# Patient Record
Sex: Female | Born: 1935
Health system: Southern US, Community
[De-identification: ages and names within clinical notes are randomized; demographics above are authoritative.]

## PROBLEM LIST (undated history)

## (undated) DIAGNOSIS — E669 Obesity, unspecified: Principal | ICD-10-CM

## (undated) DIAGNOSIS — R609 Edema, unspecified: Secondary | ICD-10-CM

## (undated) DIAGNOSIS — Z8719 Personal history of other diseases of the digestive system: Secondary | ICD-10-CM

## (undated) DIAGNOSIS — Z5189 Encounter for other specified aftercare: Secondary | ICD-10-CM

## (undated) DIAGNOSIS — M25551 Pain in right hip: Secondary | ICD-10-CM

## (undated) DIAGNOSIS — H269 Unspecified cataract: Secondary | ICD-10-CM

## (undated) DIAGNOSIS — M549 Dorsalgia, unspecified: Secondary | ICD-10-CM

## (undated) DIAGNOSIS — K219 Gastro-esophageal reflux disease without esophagitis: Secondary | ICD-10-CM

## (undated) DIAGNOSIS — N905 Atrophy of vulva: Secondary | ICD-10-CM

## (undated) DIAGNOSIS — M2550 Pain in unspecified joint: Secondary | ICD-10-CM

## (undated) DIAGNOSIS — S43014A Anterior dislocation of right humerus, initial encounter: Secondary | ICD-10-CM

## (undated) DIAGNOSIS — K7460 Unspecified cirrhosis of liver: Secondary | ICD-10-CM

## (undated) DIAGNOSIS — C4492 Squamous cell carcinoma of skin, unspecified: Secondary | ICD-10-CM

## (undated) DIAGNOSIS — R740 Nonspecific elevation of levels of transaminase and lactic acid dehydrogenase [LDH]: Secondary | ICD-10-CM

## (undated) DIAGNOSIS — K429 Umbilical hernia without obstruction or gangrene: Secondary | ICD-10-CM

## (undated) DIAGNOSIS — L659 Nonscarring hair loss, unspecified: Secondary | ICD-10-CM

## (undated) DIAGNOSIS — E1169 Type 2 diabetes mellitus with other specified complication: Secondary | ICD-10-CM

## (undated) DIAGNOSIS — Z8619 Personal history of other infectious and parasitic diseases: Secondary | ICD-10-CM

## (undated) DIAGNOSIS — N189 Chronic kidney disease, unspecified: Secondary | ICD-10-CM

## (undated) DIAGNOSIS — Z87448 Personal history of other diseases of urinary system: Secondary | ICD-10-CM

## (undated) DIAGNOSIS — L570 Actinic keratosis: Secondary | ICD-10-CM

## (undated) DIAGNOSIS — R32 Unspecified urinary incontinence: Secondary | ICD-10-CM

## (undated) DIAGNOSIS — C4401 Basal cell carcinoma of skin of lip: Secondary | ICD-10-CM

## (undated) DIAGNOSIS — K760 Fatty (change of) liver, not elsewhere classified: Secondary | ICD-10-CM

## (undated) DIAGNOSIS — I10 Essential (primary) hypertension: Secondary | ICD-10-CM

## (undated) DIAGNOSIS — G4700 Insomnia, unspecified: Secondary | ICD-10-CM

## (undated) DIAGNOSIS — H9193 Unspecified hearing loss, bilateral: Secondary | ICD-10-CM

## (undated) DIAGNOSIS — D126 Benign neoplasm of colon, unspecified: Secondary | ICD-10-CM

## (undated) DIAGNOSIS — G629 Polyneuropathy, unspecified: Secondary | ICD-10-CM

## (undated) DIAGNOSIS — E782 Mixed hyperlipidemia: Secondary | ICD-10-CM

## (undated) DIAGNOSIS — F4321 Adjustment disorder with depressed mood: Secondary | ICD-10-CM

## (undated) HISTORY — DX: Encounter for other specified aftercare: Z51.89

## (undated) HISTORY — DX: Nonspecific elevation of levels of transaminase and lactic acid dehydrogenase (ldh): R74.0

## (undated) HISTORY — DX: Insomnia, unspecified: G47.00

## (undated) HISTORY — DX: Mixed hyperlipidemia: E78.2

## (undated) HISTORY — DX: Personal history of other infectious and parasitic diseases: Z86.19

## (undated) HISTORY — PX: OTHER SURGICAL HISTORY: SHX169

## (undated) HISTORY — DX: Umbilical hernia without obstruction or gangrene: K42.9

## (undated) HISTORY — DX: Morbid (severe) obesity due to excess calories: E66.01

## (undated) HISTORY — DX: Pain in unspecified joint: M25.50

## (undated) HISTORY — DX: Unspecified urinary incontinence: R32

## (undated) HISTORY — DX: Fatty (change of) liver, not elsewhere classified: K76.0

## (undated) HISTORY — DX: Chronic kidney disease, unspecified: N18.9

## (undated) HISTORY — DX: Polyneuropathy, unspecified: G62.9

## (undated) HISTORY — DX: Unspecified hearing loss, bilateral: H91.93

## (undated) HISTORY — DX: Actinic keratosis: L57.0

## (undated) HISTORY — DX: Anterior dislocation of right humerus, initial encounter: S43.014A

## (undated) HISTORY — DX: Unspecified cataract: H26.9

## (undated) HISTORY — PX: ABDOMINAL HYSTERECTOMY: SHX81

## (undated) HISTORY — DX: Basal cell carcinoma of skin of lip: C44.01

## (undated) HISTORY — DX: Adjustment disorder with depressed mood: F43.21

## (undated) HISTORY — DX: Unspecified cirrhosis of liver: K74.60

## (undated) HISTORY — DX: Pain in right hip: M25.551

## (undated) HISTORY — DX: Type 2 diabetes mellitus with other specified complication: E11.69

## (undated) HISTORY — DX: Edema, unspecified: R60.9

## (undated) HISTORY — DX: Dorsalgia, unspecified: M54.9

## (undated) HISTORY — PX: COLONOSCOPY: SHX174

## (undated) HISTORY — DX: Personal history of other diseases of urinary system: Z87.448

## (undated) HISTORY — DX: Gastro-esophageal reflux disease without esophagitis: K21.9

## (undated) HISTORY — DX: Nonscarring hair loss, unspecified: L65.9

## (undated) HISTORY — DX: Essential (primary) hypertension: I10

## (undated) HISTORY — DX: Atrophy of vulva: N90.5

## (undated) HISTORY — DX: Benign neoplasm of colon, unspecified: D12.6

## (undated) HISTORY — DX: Squamous cell carcinoma of skin, unspecified: C44.92

## (undated) HISTORY — DX: Obesity, unspecified: E66.9

## (undated) HISTORY — DX: Personal history of other diseases of the digestive system: Z87.19

---

## 2001-04-29 ENCOUNTER — Emergency Department (HOSPITAL_COMMUNITY): Admission: EM | Admit: 2001-04-29 | Discharge: 2001-04-29 | Payer: Self-pay | Admitting: Internal Medicine

## 2002-11-13 ENCOUNTER — Other Ambulatory Visit: Admission: RE | Admit: 2002-11-13 | Discharge: 2002-11-13 | Payer: Self-pay | Admitting: Family Medicine

## 2004-07-20 ENCOUNTER — Other Ambulatory Visit: Admission: RE | Admit: 2004-07-20 | Discharge: 2004-07-20 | Payer: Self-pay | Admitting: Family Medicine

## 2004-08-24 ENCOUNTER — Encounter: Admission: RE | Admit: 2004-08-24 | Discharge: 2004-11-22 | Payer: Self-pay | Admitting: Family Medicine

## 2004-10-13 ENCOUNTER — Ambulatory Visit (HOSPITAL_COMMUNITY): Admission: RE | Admit: 2004-10-13 | Discharge: 2004-10-13 | Payer: Self-pay | Admitting: *Deleted

## 2006-12-26 LAB — HM COLONOSCOPY

## 2007-03-06 ENCOUNTER — Inpatient Hospital Stay (HOSPITAL_COMMUNITY): Admission: RE | Admit: 2007-03-06 | Discharge: 2007-03-09 | Payer: Self-pay | Admitting: Orthopaedic Surgery

## 2008-05-27 ENCOUNTER — Inpatient Hospital Stay (HOSPITAL_COMMUNITY): Admission: RE | Admit: 2008-05-27 | Discharge: 2008-05-30 | Payer: Self-pay | Admitting: Orthopaedic Surgery

## 2010-08-13 ENCOUNTER — Encounter: Payer: Self-pay | Admitting: Family Medicine

## 2010-12-26 HISTORY — PX: OTHER SURGICAL HISTORY: SHX169

## 2011-01-24 ENCOUNTER — Encounter: Payer: Self-pay | Admitting: Family Medicine

## 2011-01-24 ENCOUNTER — Ambulatory Visit
Admission: RE | Admit: 2011-01-24 | Discharge: 2011-01-24 | Payer: Self-pay | Source: Home / Self Care | Attending: Family Medicine | Admitting: Family Medicine

## 2011-01-24 DIAGNOSIS — Z87448 Personal history of other diseases of urinary system: Secondary | ICD-10-CM | POA: Insufficient documentation

## 2011-01-24 DIAGNOSIS — Z9189 Other specified personal risk factors, not elsewhere classified: Secondary | ICD-10-CM | POA: Insufficient documentation

## 2011-01-24 DIAGNOSIS — C4492 Squamous cell carcinoma of skin, unspecified: Secondary | ICD-10-CM | POA: Insufficient documentation

## 2011-01-24 DIAGNOSIS — Z8619 Personal history of other infectious and parasitic diseases: Secondary | ICD-10-CM

## 2011-01-24 DIAGNOSIS — E119 Type 2 diabetes mellitus without complications: Secondary | ICD-10-CM | POA: Insufficient documentation

## 2011-01-24 DIAGNOSIS — K219 Gastro-esophageal reflux disease without esophagitis: Secondary | ICD-10-CM

## 2011-01-24 DIAGNOSIS — I10 Essential (primary) hypertension: Secondary | ICD-10-CM

## 2011-01-24 DIAGNOSIS — C4401 Basal cell carcinoma of skin of lip: Secondary | ICD-10-CM

## 2011-01-24 DIAGNOSIS — L659 Nonscarring hair loss, unspecified: Secondary | ICD-10-CM

## 2011-01-24 DIAGNOSIS — E1169 Type 2 diabetes mellitus with other specified complication: Secondary | ICD-10-CM

## 2011-01-24 DIAGNOSIS — Z794 Long term (current) use of insulin: Secondary | ICD-10-CM

## 2011-01-24 HISTORY — DX: Nonscarring hair loss, unspecified: L65.9

## 2011-01-24 HISTORY — DX: Personal history of other diseases of urinary system: Z87.448

## 2011-01-24 HISTORY — DX: Basal cell carcinoma of skin of lip: C44.01

## 2011-01-24 HISTORY — DX: Essential (primary) hypertension: I10

## 2011-01-24 HISTORY — DX: Morbid (severe) obesity due to excess calories: E66.01

## 2011-01-24 HISTORY — DX: Squamous cell carcinoma of skin, unspecified: C44.92

## 2011-01-24 HISTORY — DX: Personal history of other infectious and parasitic diseases: Z86.19

## 2011-01-24 HISTORY — DX: Type 2 diabetes mellitus with other specified complication: E11.69

## 2011-01-24 HISTORY — DX: Gastro-esophageal reflux disease without esophagitis: K21.9

## 2011-01-31 LAB — CONVERTED CEMR LAB
ALT: 43 units/L — ABNORMAL HIGH (ref 0–35)
AST: 72 units/L — ABNORMAL HIGH (ref 0–37)
Albumin: 3.9 g/dL (ref 3.5–5.2)
Alkaline Phosphatase: 156 units/L — ABNORMAL HIGH (ref 39–117)
Calcium: 9.5 mg/dL (ref 8.4–10.5)
Chloride: 102 meq/L (ref 96–112)
Cholesterol: 204 mg/dL — ABNORMAL HIGH (ref 0–200)
Creatinine, Ser: 0.84 mg/dL (ref 0.40–1.20)
HCT: 43.3 % (ref 36.0–46.0)
HDL: 46 mg/dL (ref >39)
MCHC: 34.9 g/dL (ref 30.0–36.0)
Microalb Creat Ratio: 5.6 mg/g (ref 0.0–30.0)
Platelets: 189 10*3/uL (ref 150–400)
RDW: 12.9 % (ref 11.5–15.5)
Total Protein: 7.4 g/dL (ref 6.0–8.3)
Triglycerides: 138 mg/dL (ref <150)

## 2011-02-02 NOTE — Assessment & Plan Note (Signed)
Summary: New pt est care/dt Medicare   Vital Signs:  Patient profile:   76 year old female Height:      63 inches (160.02 cm) Weight:      240 pounds (109.09 kg) BMI:     42.67 O2 Sat:      92 % on Room air Temp:     97.8 degrees F (36.56 degrees C) oral Pulse rate:   95 / minute BP sitting:   144 / 82  (right arm) Cuff size:   large  Vitals Entered By: Josph Macho RMA (02-01-2011 8:25 AM)  O2 Flow:  Room air CC: Establish new patient/ CF   History of Present Illness: Patient is a 76 year old Caucasian and age this establish care. She reports multiple medical problems. long-standing history of reflux and is not presently taking any medications but does note nearly daily symptoms with dyspepsia, burping, septal and burning at times as well as a brackish liquid in her throat in the morning at times. She has a history of basal cell carcinoma below her lower lip years ago and more recently about 3 weeks ago biopsy proved positive for squamous cell carcinoma on her right anterior chest wall she is following with Dermatology and will continue to do so. He has had some mild episodes of upper quadrant pain but they tend to be more with position changes than related to food intake. She does not have any diaphoresis nausea and pains occur. Last Pap was many years ago last mammogram was in February of 2011. She denies any recent illness, fevers, chills, chest pain, palpitations, shortness of breath, GI or GU complaints at this time. she reports recent hair loss and comes in with a list of tests including a ferritin and iron level but her dermatologist I recommended she try and and asked that we order them today. No other skin changes noted today  Preventive Screening-Counseling & Management  Alcohol-Tobacco     Smoking Status: never  Caffeine-Diet-Exercise     Does Patient Exercise: no      Drug Use:  no.    Problems Prior to Update: 1)  Morbid Obesity  (ICD-278.01) 2)  Hair Loss   (ICD-704.00) 3)  Essential Hypertension, Benign  (ICD-401.1) 4)  Dm  (ICD-250.00) 5)  Measles, Hx of  (ICD-V12.09)  Current Problems (verified): 1)  Gerd  (ICD-530.81) 2)  Squamous Cell Carcinoma Other Spec Sites Skin  (ICD-173.82) 3)  Basal Cell Carcinoma of Skin of Lip  (ICD-173.01) 4)  Abdominal Pain, Right Upper Quadrant, Hx of  (ICD-V15.89) 5)  Uti's, Hx of  (ICD-V13.00) 6)  Morbid Obesity  (ICD-278.01) 7)  Hair Loss  (ICD-704.00) 8)  Essential Hypertension, Benign  (ICD-401.1) 9)  Dm  (ICD-250.00) 10)  Measles, Hx of  (ICD-V12.09)  Medications Prior to Update: 1)  None  Current Medications (verified): 1)  None  Allergies (verified): 1)  Metformin Hcl (Metformin Hcl)  Past History:  Family History: Last updated: 02/01/11 Father: deceased@83 , stroke, skin cancer Mother: deceased@81 , CHF, RA, myelodysplastic syndrome Siblings:  Brother: 25, A&W, smoker MGM: deceased@78 , stroke MGF: deceased@76 , kidney failure PGM: deceased late 26s, unknown cause PGF: deceased@81 , old age Children: Son: 29, A[employer]W  Social History: Last updated: Feb 01, 2011 Wears seat belt routinely No dietary restrictions, tries to minimize carbs but does drink 2 16 oz Dr Reino Kent Retired from [employer], [employer] and [employer] 47 yo niece she helps take care Widow/Widower Remarried in 2005, lives with husband Drug use-no Regular exercise-no  Risk Factors: Exercise: no (01/24/2011)  Risk Factors: Smoking Status: never (01/24/2011)  Past Surgical History: Hysterectomy, partial, both ovaries left in place Total knee replacement, b/l Cataract extraction, b/l  skin bx, right anterior CW 12/2010, squamous cancer, had to redo to margins BCC removed below lower lip on left, 26 yrs ago Left knee arthroscopy and cleaning prior to TKR  Family History: Father: deceased@83 , stroke, skin cancer Mother: deceased@81 , CHF, RA, myelodysplastic syndrome Siblings:  Brother: 50, [employer], smoker MGM:  deceased@78 , stroke MGF: deceased@76 , kidney failure PGM: deceased late 88s, unknown cause PGF: deceased@81 , old age Children: Son: 30, [employer]  Social History: Wears seat belt routinely No dietary restrictions, tries to minimize carbs but does drink 2 16 oz Dr Reino Kent Retired from [place], [employer] and [employer] 60 yo niece she helps take care Widow/Widower Remarried in 2005, lives with husband Drug use-no Regular exercise-no Occupation:  employed Smoking Status:  never Drug Use:  no Does Patient Exercise:  no  Review of Systems  The patient denies anorexia, fever, weight loss, weight gain, vision loss, decreased hearing, hoarseness, chest pain, syncope, dyspnea on exertion, peripheral edema, prolonged cough, headaches, hemoptysis, abdominal pain, melena, hematochezia, severe indigestion/heartburn, hematuria, incontinence, genital sores, muscle weakness, suspicious skin lesions, transient blindness, difficulty walking, unusual weight change, abnormal bleeding, and enlarged lymph nodes.    Physical Exam  General:  Well-developed,well-nourished,in no acute distress; alert,appropriate and cooperative throughout examination Head:  Normocephalic and atraumatic without obvious abnormalities. No apparent alopecia or balding. Eyes:  No corneal or conjunctival inflammation noted. EOMI. Perrla.  Ears:  External ear exam shows no significant lesions or deformities.  Otoscopic examination reveals clear canals, tympanic membranes are intact bilaterally without bulging, retraction, inflammation or discharge. Hearing is grossly normal bilaterally. Nose:  External nasal examination shows no deformity or inflammation. Nasal mucosa are pink and moist without lesions or exudates. Mouth:  Oral mucosa and oropharynx without lesions or exudates.  Oropharynx erythematous Neck:  No deformities, masses, or tenderness noted. Lungs:  Normal respiratory effort, chest expands symmetrically. Lungs are clear  to auscultation, no crackles or wheezes. Heart:  Normal rate and regular rhythm. S1 and S2 normal without gallop, murmur, click, rub or other extra sounds. Abdomen:  Bowel sounds positive,abdomen soft and non-tender without masses, organomegaly or hernias noted. Msk:  No deformity or scoliosis noted of thoracic or lumbar spine.   Pulses:  R and L carotid,dorsalis pedis and posterior tibial pulses are full and equal bilaterally Extremities:  No clubbing, cyanosis, edema, or deformity noted with normal full range of motion of all joints.   Neurologic:  No cranial nerve deficits noted. Station and gait are normal. Plantar reflexes are down-going bilaterally. DTRs are symmetrical throughout. Sensory, motor and coordinative functions appear intact. Skin:  Intact without suspicious lesions or rashes Cervical Nodes:  No lymphadenopathy noted Psych:  Cognition and judgment appear intact. Alert and cooperative with normal attention span and concentration. No apparent delusions, illusions, hallucinations   Impression & Recommendations:  Problem # 1:  DM (ICD-250.00)  Orders: T- Hemoglobin A1C ([id number]) T-Urine Microalbumin w/creat. ratio [id number]) Avoid simple carbs and may consider restarting Januvia after labs are available, Metformin poorly tolerated in past.  Problem # 2:  GERD (ICD-530.81)  Her updated medication list for this problem includes:    Ranitidine Hcl 300 Mg Tabs (Ranitidine hcl) .Marland Kitchen... 1 tab by mouth daily Avoid offending foods and reevaluate at next visit.  Problem # 3:  BASAL CELL CARCINOMA OF SKIN OF LIP (ICD-173.01)  Follows regularly iwth Dr Dorna Mai, will continue to do so  Problem # 4:  HAIR LOSS (ICD-704.00)  Orders: T-Ferritin ([id number]) Augusto Gamble ([id number]) Labs including TSH ordered. Patient brought in a note from her dermatologist who recommended she check iron and ferritin as well. These are ordered today  Problem # 5:  Gynecological  examination-routine (ICD-V72.31) Will proceed with GYN exam at next visit, patient reports many years has passed since last GYN exam, will request old recordsin the meantime  Problem # 6:  ESSENTIAL HYPERTENSION, BENIGN (ICD-401.1)  Orders: T-Basic Metabolic Panel [phone]) T-Hepatic Function [id number]) T-Lipid Profile [id number]) T-CBC No Diff ([id number]) T-TSH [id number]) Patient hesitant to start meds but she is advised to consider a ACEI due to her history of DM as well, will reassess at next visit.  Complete Medication List: 1)  Ranitidine Hcl 300 Mg Tabs (Ranitidine hcl) .Marland Kitchen.. 1 tab by mouth daily  Patient Instructions: 1)  Please schedule a follow-up appointment in 2 to 4 weeks or as needed. 2)  Call if sore throat, cough worsen or fevers develop 3)  Avoid foods high in acid(tomatoes, citrus juices,spicy foods).Avoid eating within two hours of lying down or before exercising. Do not over eat: try smaller more frequent meals. Elevate head of bed twelve inches when sleeping.  4)  Release of REcords Dr Bufford Buttner, derm 5)  Release of [facility], if not already here Prescriptions: RANITIDINE HCL 300 MG TABS (RANITIDINE HCL) 1 tab by mouth daily  #30 x 2   Entered and Authorized by:   Danise Edge MD   Signed by:   Danise Edge MD on 01/24/2011   Method used:   [facility] to        [facility]  [address] [phone][facility] (retail)       [address] [place], [place]  [address]       Ph: [phone] or [phone]       Fax: [phone]   RxID:   [id number]    Orders Added: 1)  T-Basic Metabolic Panel [phone] 2)  T-Hepatic Function [[id number]] 3)  T-Lipid Profile [80061-22930] 4)  T-CBC No Diff [[id number]] 5)  T-TSH [[id number]] 6)  T- Hemoglobin A1C [83036-23375] 7)  T-Urine Microalbumin w/creat. ratio [[id number]] 8)  T-Ferritin [82728-23350] 9)  T-Iron [[id number]] 10)  New Patient Level IV  [93810]    Preventive Care Screening  Last Flu Shot:    Date:  08/26/2010    Results:  historical   Bone Density:    Date:  12/26/2009    Results:  historical std dev  Mammogram:    Date:  12/26/2008    Results:  historical   Colonoscopy:    Date:  12/26/2006    Results:  historical

## 2011-02-03 ENCOUNTER — Encounter: Payer: Self-pay | Admitting: *Deleted

## 2011-02-07 ENCOUNTER — Other Ambulatory Visit (HOSPITAL_COMMUNITY)
Admission: RE | Admit: 2011-02-07 | Discharge: 2011-02-07 | Disposition: A | Discharge status: Home / Self Care | Payer: Medicare Other | Source: Ambulatory Visit | Attending: Family Medicine | Admitting: Family Medicine

## 2011-02-07 ENCOUNTER — Encounter: Payer: Self-pay | Admitting: Family Medicine

## 2011-02-07 ENCOUNTER — Ambulatory Visit (INDEPENDENT_AMBULATORY_CARE_PROVIDER_SITE_OTHER): Payer: Self-pay | Admitting: Family Medicine

## 2011-02-07 ENCOUNTER — Other Ambulatory Visit: Payer: Self-pay | Admitting: Family Medicine

## 2011-02-07 DIAGNOSIS — R32 Unspecified urinary incontinence: Secondary | ICD-10-CM | POA: Insufficient documentation

## 2011-02-07 DIAGNOSIS — Z8719 Personal history of other diseases of the digestive system: Secondary | ICD-10-CM

## 2011-02-07 DIAGNOSIS — R740 Nonspecific elevation of levels of transaminase and lactic acid dehydrogenase [LDH]: Secondary | ICD-10-CM

## 2011-02-07 DIAGNOSIS — R7401 Elevation of levels of liver transaminase levels: Secondary | ICD-10-CM

## 2011-02-07 DIAGNOSIS — R7402 Elevation of levels of lactic acid dehydrogenase (LDH): Secondary | ICD-10-CM

## 2011-02-07 DIAGNOSIS — K219 Gastro-esophageal reflux disease without esophagitis: Secondary | ICD-10-CM

## 2011-02-07 DIAGNOSIS — E119 Type 2 diabetes mellitus without complications: Secondary | ICD-10-CM

## 2011-02-07 DIAGNOSIS — Z01419 Encounter for gynecological examination (general) (routine) without abnormal findings: Secondary | ICD-10-CM | POA: Insufficient documentation

## 2011-02-07 DIAGNOSIS — I10 Essential (primary) hypertension: Secondary | ICD-10-CM

## 2011-02-07 DIAGNOSIS — Z124 Encounter for screening for malignant neoplasm of cervix: Secondary | ICD-10-CM

## 2011-02-07 HISTORY — DX: Elevation of levels of liver transaminase levels: R74.01

## 2011-02-07 HISTORY — DX: Unspecified urinary incontinence: R32

## 2011-02-07 HISTORY — DX: Personal history of other diseases of the digestive system: Z87.19

## 2011-02-07 HISTORY — DX: Elevation of levels of lactic acid dehydrogenase (LDH): R74.02

## 2011-02-08 ENCOUNTER — Other Ambulatory Visit: Payer: Self-pay | Admitting: Family Medicine

## 2011-02-08 DIAGNOSIS — R7401 Elevation of levels of liver transaminase levels: Secondary | ICD-10-CM

## 2011-02-09 ENCOUNTER — Ambulatory Visit (HOSPITAL_BASED_OUTPATIENT_CLINIC_OR_DEPARTMENT_OTHER)
Admission: RE | Admit: 2011-02-09 | Discharge: 2011-02-09 | Disposition: A | Discharge status: Home / Self Care | Payer: Medicare Other | Source: Ambulatory Visit | Attending: Family Medicine | Admitting: Family Medicine

## 2011-02-09 ENCOUNTER — Encounter: Payer: Self-pay | Admitting: Family Medicine

## 2011-02-09 DIAGNOSIS — R7402 Elevation of levels of lactic acid dehydrogenase (LDH): Secondary | ICD-10-CM | POA: Insufficient documentation

## 2011-02-09 DIAGNOSIS — R945 Abnormal results of liver function studies: Secondary | ICD-10-CM | POA: Insufficient documentation

## 2011-02-09 DIAGNOSIS — R7401 Elevation of levels of liver transaminase levels: Secondary | ICD-10-CM | POA: Insufficient documentation

## 2011-02-11 ENCOUNTER — Telehealth (INDEPENDENT_AMBULATORY_CARE_PROVIDER_SITE_OTHER): Payer: Self-pay | Admitting: *Deleted

## 2011-02-16 NOTE — Assessment & Plan Note (Signed)
Summary: 2 WEEK FOLLOW UP APPT   Vital Signs:  Patient profile:   76 year old female Height:      63 inches (160.02 cm) Weight:      241.50 pounds (109.77 kg) O2 Sat:      96 % on Room air Temp:     97.6 degrees F (36.44 degrees C) oral Pulse rate:   73 / minute BP sitting:   135 / 77  (right arm) Cuff size:   large  Vitals Entered By: Josph Macho RMA (February 07, 2011 8:11 AM)  O2 Flow:  Room air CC: 2 week follow up/ CF Is Patient Diabetic? Yes   History of Present Illness: Patient is a 76yo Caucasian female in today for follow up on her initial appt and for her pap. She is feeling well and has not had any new concerns or acute illnesses since her last visit. She notes ongoing trouble with b/l foot pain. She has a long history numbness on her left heel secondary to nerve damage suffered during her knee surgery. Her c/o today is worsening long standing pain in her feet especially at night after being on her feet for long periods of time. She says they feel tight and burning. The pain does not keep her up but does make her uncomfortable. She denies any CP/palp/SOB/HA/congestion/f/c/GI c/o. She moves her bowels daily without difficulty. No bloody or tarry stool. Does not recall when her last colonoscopy was but she believes it was somewhere between 7-10 years ago. Her MGM is due, she denies any breast tenderness, lesions, skin changes or discharge. She has previously had her MGMs at [facility], [place].  She reports having abnormal paps in past and she even believes some were cancerous and that is why they did a hysterectomy, she denies any h/o endometrial or uterine CA, but her mother had Uterine CA. She is married but not sexually active, denies any vaginal discharge, lesions, pain or gyn concerns. Does note some mild urinary incontinece with heavy lifting, straining but denies any dysuria, hematuria, abd or back pain  Current Medications (verified): 1)  Ranitidine Hcl 300 Mg Tabs  (Ranitidine Hcl) .Marland Kitchen.. 1 Tab By Mouth Daily 2)  Januvia 100 Mg Tabs (Sitagliptin Phosphate) .Marland Kitchen.. 1 Daily  Allergies (verified): 1)  Metformin Hcl (Metformin Hcl)  Past History:  Past medical history reviewed for relevance to current acute and chronic problems. Social history (including risk factors) reviewed for relevance to current acute and chronic problems.  Social History: Reviewed history from 01/24/2011 and no changes required. Wears seat belt routinely No dietary restrictions, tries to minimize carbs but does drink 2 16 oz Dr Reino Kent Retired from [employer], [employer] and [employer] 42 yo niece she helps take care Widow/Widower Remarried in 2005, lives with husband Drug use-no Regular exercise-no  Review of Systems      See HPI  Physical Exam  General:  Well-developed,well-nourished,in no acute distress; alert,appropriate and cooperative throughout examination Head:  Normocephalic and atraumatic without obvious abnormalities. No apparent alopecia or balding. Mouth:  Oral mucosa and oropharynx without lesions or exudates.  Neck:  No deformities, masses, or tenderness noted. Chest Wall:  No deformities, masses, or tenderness noted. Breasts:  No mass, nodules, thickening, tenderness, bulging, retraction, inflamation, nipple discharge or skin changes noted.   Lungs:  Normal respiratory effort, chest expands symmetrically. Lungs are clear to auscultation, no crackles or wheezes. Heart:  Normal rate and regular rhythm. S1 and S2 normal without gallop, murmur, click,  rub or other extra sounds. Abdomen:  Bowel sounds positive,abdomen soft and non-tender without masses, organomegaly or hernias noted. Rectal:  no external abnormalities and no hemorrhoids.   Genitalia:  Normal introitus for age, no external lesions, no vaginal discharge, mucosa pink and moist, no vaginal, no vaginal atrophy, no friaility or hemorrhage, normal uterus size and position, no adnexal masses or  tenderness. Msk:  No deformity or scoliosis noted of thoracic or lumbar spine.   Pulses:  R and L carotid,dorsalis pedis and posterior tibial pulses are full and equal bilaterally Extremities:  trace left pedal edema and trace right pedal edema. Dry skin on heels but skin intact throughout. all toes cold and pale  Neurologic:  sensation intact on b/l feet, except for numbness on left heel.  Cervical Nodes:  No lymphadenopathy noted Psych:  Cognition and judgment appear intact. Alert and cooperative with normal attention span and concentration. No apparent delusions, illusions, hallucinations   Impression & Recommendations:  Problem # 1:  Gynecological examination-routine (ICD-V72.31) Patient reports h/o abnormal cervical celss, possibly cancer in the past which resulted in a hysterectomy, will repeat pap again next year. Patient does her MGMs at [facility], she is given a prescription to proceed with this years MGM  Problem # 2:  CHOLELITHIASIS, HX OF (ICD-V12.79)  Orders: Radiology Referral (Radiology) Does have some occasional RUQ although she cannot necessarily correlate it with fatty food intake. She should  avoid fatty foods and report any worsening symptoms. Minimize carb intake due to elevated LFTs and check an acute hepatitis panel. Seek old recoreds.  Problem # 3:  GERD (ICD-530.81)  Her updated medication list for this problem includes:    Ranitidine Hcl 300 Mg Tabs (Ranitidine hcl) .Marland Kitchen... 1 tab by mouth daily Much improved on medications, continue the same and avoid aoffending foods  Problem # 4:  ESSENTIAL HYPERTENSION, BENIGN (ICD-401.1)  Her updated medication list for this problem includes:    Lisinopril-hydrochlorothiazide 10-12.5 Mg Tabs (Lisinopril-hydrochlorothiazide) .Marland Kitchen... 1 tab by mouth daily Started an Ace Inhibitors and reevaluate at next visit.  Problem # 5:  DM (ICD-250.00)  Her updated medication list for this problem includes:    Januvia 100 Mg Tabs  (Sitagliptin phosphate) .Marland Kitchen... 1 daily    Lisinopril-hydrochlorothiazide 10-12.5 Mg Tabs (Lisinopril-hydrochlorothiazide) .Marland Kitchen... 1 tab by mouth daily Given a Freestyle Lite glucometer to check sugars qam and as needed report concerning numbers and decrease carbohydrate intake, recently restarted Januvia  Problem # 6:  URINARY INCONTINENCE (ICD-788.30) scant amounts of leakage noted with lifting, position changes cough, encouraged Kegel exercises as direced two times a day and reassess at next visit.  Complete Medication List: 1)  Ranitidine Hcl 300 Mg Tabs (Ranitidine hcl) .Marland Kitchen.. 1 tab by mouth daily 2)  Januvia 100 Mg Tabs (Sitagliptin phosphate) .Marland Kitchen.. 1 daily 3)  Lisinopril-hydrochlorothiazide 10-12.5 Mg Tabs (Lisinopril-hydrochlorothiazide) .Marland Kitchen.. 1 tab by mouth daily 4)  Neurontin 100 Mg Caps (Gabapentin) .Marland Kitchen.. 1 tab by mouth at bedtime 5)  Flaxseed Oil 1000 Mg Caps (Flaxseed (linseed)) .Marland Kitchen.. 1 cap by mouth daily 6)  Lancets Misc (Lancets) .... Check blood sugars qam and once daily as needed symptoms  Other Orders: Obtaining Screening PAP Smear (Z6109)  Patient Instructions: 1)  Please schedule a follow-up appointment in 1 to 2 months or sooner if needed 2)  Try Kegel exercises as directed two times a day  3)  Check your blood sugars regularly. If your readings are usually above:  or below 70 you should contact our office.  4)  Avoid simple sugars and carbs. 5)  Check with the pharmacist to see if there is a different Glucometer that would have cheaper strips on your insurance than the Freestyle we gave you, if so we can send in a prescription for that. 6)  Sign a release of records for [place] [facility] again and we will hold it for the week before submitting again to see if it shows up in the computer later this week.  7)  See your eye doctor yearly to check for diabetic eye damage, keep your appt in March, have them send [name] a copy of your visit note for your chart 8)  Check your feet each  night  for sore areas, calluses or signs of infection. Always wear shoes Prescriptions: LANCETS  MISC (LANCETS) check Blood sugars qam and once daily as needed symptoms  #1 box x 3   Entered and Authorized by:   Danise Edge MD   Signed by:   Danise Edge MD on 02/07/2011   Method used:   [facility]        [facility]  [address] [phone][facility] ([facility]ail[facility]       [address] [place], [place]  [address]       Ph: [phone] or [phone]       Fax: [phone]   RxID:   [id number] NEURONTIN 100 MG CAPS (GABAPENTIN) 1 tab by mouth at bedtime  #30 x 1   Entered and Authorized by:   Danise Edge MD   Signed by:   Danise Edge MD on 02/07/2011   Method used:   [facility]        [facility]  [address] [phone][facility] ([facility]ail[facility]       [address] [place], [place]  [address]       Ph: [phone] or [phone]       Fax: [phone]   RxID:   [id number] LISINOPRIL-HYDROCHLOROTHIAZIDE 10-12.5 MG TABS (LISINOPRIL-HYDROCHLOROTHIAZIDE) 1 tab by mouth daily  #30 x 1   Entered and Authorized by:   Danise Edge MD   Signed by:   Danise Edge MD on 02/07/2011   Method used:   [facility]        [facility]  [address] [phone][facility] (retail)       [address] [place], [place]  [address]       Ph: [phone] or [phone]       Fax: [phone]   RxID:   [id number]    Orders Added: 1)  Radiology Referral [Radiology] 2)  Est. Patient Level IV [10258] 3)  Obtaining Screening PAP Smear [Q0091]

## 2011-02-16 NOTE — Progress Notes (Signed)
Summary: Abdominal ultrasound results  Phone Note Outgoing Call   Summary of Call: Per MD notify pt abdominal ultrasound is normal. pt informed. Initial call taken by: Josph Macho RMA,  February 11, 2011 9:09 AM

## 2011-02-16 NOTE — Letter (Signed)
Summary: [facility] 2010-11  [facility] 2010-11   Imported By: Lester [place] 02/08/2011 08:04:41  _____________________________________________________________________  External Attachment:    Type:   Image     Comment:   External Document

## 2011-03-07 ENCOUNTER — Encounter: Payer: Self-pay | Admitting: *Deleted

## 2011-03-07 ENCOUNTER — Ambulatory Visit (INDEPENDENT_AMBULATORY_CARE_PROVIDER_SITE_OTHER): Payer: Medicare Other | Admitting: Family Medicine

## 2011-03-07 ENCOUNTER — Encounter: Payer: Self-pay | Admitting: Family Medicine

## 2011-03-07 DIAGNOSIS — I10 Essential (primary) hypertension: Secondary | ICD-10-CM

## 2011-03-07 DIAGNOSIS — E119 Type 2 diabetes mellitus without complications: Secondary | ICD-10-CM

## 2011-03-07 DIAGNOSIS — K219 Gastro-esophageal reflux disease without esophagitis: Secondary | ICD-10-CM

## 2011-03-07 DIAGNOSIS — R7401 Elevation of levels of liver transaminase levels: Secondary | ICD-10-CM

## 2011-03-08 ENCOUNTER — Ambulatory Visit: Payer: BC Managed Care – HMO | Admitting: Family Medicine

## 2011-03-08 ENCOUNTER — Telehealth (INDEPENDENT_AMBULATORY_CARE_PROVIDER_SITE_OTHER): Payer: Self-pay | Admitting: *Deleted

## 2011-03-09 ENCOUNTER — Encounter: Payer: Self-pay | Admitting: *Deleted

## 2011-03-15 NOTE — Assessment & Plan Note (Signed)
Vital Signs:  Patient profile:   76 year old female Height:      63 inches (160.02 cm) Weight:      238 pounds (108.18 kg) O2 Sat:      98 % on Room air Temp:     97.5 degrees F (36.39 degrees C) oral Pulse rate:   71 / minute BP sitting:   118 / 82  (right arm) Cuff size:   large  Vitals Entered By: Josph Macho RMA (March 07, 2011 8:20 AM)  O2 Flow:  Room air CC: follow-up visit/ CF Is Patient Diabetic? Yes   History of Present Illness: Patient is a 76yo Caucasian female in today for follow up on mulptilce medical concerns. she is a known diabetic or low blood sugar 145. She says she underwent here when taken with her. She had one episode of feeling shaky checked her blood sugar was 107 but she had not eaten in a number of hours since breakfast and did eat only a small breakfast that day. Most of her numbers in the 110 to 140s did not make her feel badly. She is having difficulty getting her transported to pay for her up glucose test strips she has been testing would help in April had some strips and her sugars are well-controlled. She denies any recent illness, fevers, chills, chest pain palpitations, shortness of breath, GI or GU complaints her major complaint is of itchy skin.  Current Medications (verified): 1)  Ranitidine Hcl 300 Mg Tabs (Ranitidine Hcl) .Marland Kitchen.. 1 Tab By Mouth Daily 2)  Januvia 100 Mg Tabs (Sitagliptin Phosphate) .Marland Kitchen.. 1 Daily 3)  Lisinopril-Hydrochlorothiazide 10-12.5 Mg Tabs (Lisinopril-Hydrochlorothiazide) .Marland Kitchen.. 1 Tab By Mouth Daily 4)  Neurontin 100 Mg Caps (Gabapentin) .Marland Kitchen.. 1 Tab By Mouth At Bedtime 5)  Flaxseed Oil 1000 Mg Caps (Flaxseed (Linseed)) .Marland Kitchen.. 1 Cap By Mouth Daily 6)  Lancets  Misc (Lancets) .... Check Blood Sugars Qam and Once Daily As Needed Symptoms  Allergies (verified): 1)  Metformin Hcl (Metformin Hcl)  Past History:  Past medical history reviewed for relevance to current acute and chronic problems. Social history (including risk  factors) reviewed for relevance to current acute and chronic problems.  Social History: Reviewed history from 01/24/2011 and no changes required. Wears seat belt routinely No dietary restrictions, tries to minimize carbs but does drink 2 16 oz Dr Reino Kent Retired from [employer], [employer] and [employer] 20 yo niece she helps take care Widow/Widower Remarried in 2005, lives with husband Drug use-no Regular exercise-no  Review of Systems      See HPI  Physical Exam  General:  Well-developed,well-nourished,in no acute distress; alert,appropriate and cooperative throughout examination Head:  Normocephalic and atraumatic without obvious abnormalities. No apparent alopecia or balding. Mouth:  Oral mucosa and oropharynx without lesions or exudates.  Teeth in good repair. Neck:  No deformities, masses, or tenderness noted. Lungs:  Normal respiratory effort, chest expands symmetrically. Lungs are clear to auscultation, no crackles or wheezes. Heart:  Normal rate and regular rhythm. S1 and S2 normal without gallop, murmur, click, rub or other extra sounds. Abdomen:  Bowel sounds positive,abdomen soft and non-tender without masses, organomegaly or hernias noted. Obese Extremities:  No clubbing, cyanosis, edema, or deformity noted with normal full range of motion of all joints.   Cervical Nodes:  No lymphadenopathy noted Psych:  Cognition and judgment appear intact. Alert and cooperative with normal attention span and concentration. No apparent delusions, illusions, hallucinations   Impression & Recommendations:  Problem # 1:  DM (ICD-250.00)  Her updated medication list for this problem includes:    Januvia 100 Mg Tabs (Sitagliptin phosphate) .Marland Kitchen... 1 daily    Lisinopril-hydrochlorothiazide 10-12.5 Mg Tabs (Lisinopril-hydrochlorothiazide) .Marland Kitchen... 1 tab by mouth daily Patient having trouble with getting her insurance company to pay for her test strips for her new meter but she has been  checking her sugars each morning. Her am sugars have ranged from 110 to 145. She had one episode of feeling shakey and week later in the day and when she checked her sugar it was 107. She denies skipping meals but she does get up early and only eats a bowl of sugary. She is encouraged to eat a healthy protein with each meal and to minimize carbs, She must exercise and may move her Januvia to luchtime when she tends to eat more  Problem # 2:  NONSPEC ELEVATION OF LEVELS OF TRANSAMINASE/LDH (ICD-790.4) Mild and persistent, enocuraged to avoid simple carbs and fats in diet and continue to monitor  Problem # 3:  ESSENTIAL HYPERTENSION, BENIGN (ICD-401.1)  Her updated medication list for this problem includes:    Lisinopril-hydrochlorothiazide 10-12.5 Mg Tabs (Lisinopril-hydrochlorothiazide) .Marland Kitchen... 1 tab by mouth daily Well controlled, no change in therapy  Problem # 4:  GERD (ICD-530.81)  Her updated medication list for this problem includes:    Ranitidine Hcl 300 Mg Tabs (Ranitidine hcl) .Marland Kitchen... 1 tab by mouth daily Good response to Ranitidine, may continue to use as needed   Complete Medication List: 1)  Ranitidine Hcl 300 Mg Tabs (Ranitidine hcl) .Marland Kitchen.. 1 tab by mouth daily 2)  Januvia 100 Mg Tabs (Sitagliptin phosphate) .Marland Kitchen.. 1 daily 3)  Lisinopril-hydrochlorothiazide 10-12.5 Mg Tabs (Lisinopril-hydrochlorothiazide) .Marland Kitchen.. 1 tab by mouth daily 4)  Neurontin 100 Mg Caps (Gabapentin) .Marland Kitchen.. 1 tab by mouth at bedtime 5)  Flaxseed Oil 1000 Mg Caps (Flaxseed (linseed)) .Marland Kitchen.. 1 cap by mouth daily 6)  Lancets Misc (Lancets) .... Check blood sugars qam and once daily as needed symptoms  Patient Instructions: 1)  Please schedule a follow-up appointment in 6 weeks 2)  needs labs prior, hgba1c, FLP, liver, renal, cbc, tsh, acute hep panel 3)  Eat yogurt daily, continue to monitor sugars 4)  Move Januvia to lunch, call with any concerns Prescriptions: NEURONTIN 100 MG CAPS (GABAPENTIN) 1 tab by mouth at  bedtime  #30 x 3   Entered by:   Josph Macho RMA   Authorized by:   Danise Edge MD   Signed by:   Josph Macho RMA on 03/07/2011   Method used:   Electronically to        [facility] [phone]* (retail)       [address] [place], [place]  [address]       Ph: [phone] or [phone]       Fax: [phone]   RxID:   [id number] LISINOPRIL-HYDROCHLOROTHIAZIDE 10-12.5 MG TABS (LISINOPRIL-HYDROCHLOROTHIAZIDE) 1 tab by mouth daily  #30 x 3   Entered by:   Josph Macho RMA   Authorized by:   Danise Edge MD   Signed by:   Josph Macho RMA on 03/07/2011   Method used:   Electronically to        [facility]  [address] [facility] [phone]* (retail)       [address]       [place], [place]  [address]       Ph: [phone] or [phone]       Fax: [phone]   RxID:   [id number] JANUVIA 100 MG TABS (SITAGLIPTIN PHOSPHATE) 1 daily  #30 x 3   Entered by:   Josph Macho RMA   Authorized by:   Danise Edge MD   Signed by:   Josph Macho RMA on 03/07/2011   Method used:   Electronically to        [facility]  [address] [phone][facility] (retail)       [address]       [place], [place]  [address]       Ph: [phone] or [phone]       Fax: [phone]   RxID:   [id number]    Orders Added: 1)  Est. Patient Level IV [706[address]]

## 2011-03-15 NOTE — Progress Notes (Signed)
Summary: Needing instructions for test strips  Phone Note From Pharmacy Call back at [phone]   Summary of Call: Needing instructions and ICD code for test strips , Pharmacy [facility] [phone].   Initial call taken by: Georga Bora,  March 08, 2011 11:48 AM  Follow-up for Phone Call        please send sig with check bs daily and two times a day as needed for concerning symptoms and whatever they need from [name] Follow-up by: Danise Edge MD,  March 08, 2011 12:12 PM  Additional Follow-up for Phone Call Additional follow up Details #1::        Refaxed paperwork Additional Follow-up by: Josph Macho RMA,  March 08, 2011 1:19 PM

## 2011-04-12 ENCOUNTER — Encounter: Payer: Self-pay | Admitting: Family Medicine

## 2011-04-13 ENCOUNTER — Other Ambulatory Visit: Payer: Self-pay | Admitting: Family Medicine

## 2011-04-13 ENCOUNTER — Other Ambulatory Visit (INDEPENDENT_AMBULATORY_CARE_PROVIDER_SITE_OTHER): Payer: Medicare Other

## 2011-04-13 DIAGNOSIS — Z0000 Encounter for general adult medical examination without abnormal findings: Secondary | ICD-10-CM

## 2011-04-13 DIAGNOSIS — E119 Type 2 diabetes mellitus without complications: Secondary | ICD-10-CM

## 2011-04-14 LAB — HEPATIC FUNCTION PANEL
ALT: 29 U/L (ref 0–35)
AST: 48 U/L — ABNORMAL HIGH (ref 0–37)
Bilirubin, Direct: 0.2 mg/dL (ref 0.0–0.3)
Indirect Bilirubin: 0.5 mg/dL (ref 0.0–0.9)
Total Bilirubin: 0.7 mg/dL (ref 0.3–1.2)
Total Protein: 7.3 g/dL (ref 6.0–8.3)

## 2011-04-14 LAB — BASIC METABOLIC PANEL
CO2: 21 mEq/L (ref 19–32)
Calcium: 9.5 mg/dL (ref 8.4–10.5)
Chloride: 105 mEq/L (ref 96–112)
Glucose, Bld: 145 mg/dL — ABNORMAL HIGH (ref 70–99)
Potassium: 4.1 mEq/L (ref 3.5–5.3)
Sodium: 139 mEq/L (ref 135–145)

## 2011-04-14 LAB — CBC
MCHC: 33.9 g/dL (ref 30.0–36.0)
MCV: 92.4 fL (ref 78.0–100.0)
Platelets: 198 10*3/uL (ref 150–400)
RDW: 13.6 % (ref 11.5–15.5)
WBC: 10.1 10*3/uL (ref 4.0–10.5)

## 2011-04-14 LAB — HEPATITIS PANEL, ACUTE
HCV Ab: NEGATIVE
Hep A IgM: NEGATIVE
Hep B C IgM: NEGATIVE
Hepatitis B Surface Ag: NEGATIVE

## 2011-04-14 LAB — LIPID PANEL
Cholesterol: 208 mg/dL — ABNORMAL HIGH (ref 0–200)
HDL: 39 mg/dL — ABNORMAL LOW (ref >39)
VLDL: 31 mg/dL (ref 0–40)

## 2011-04-14 LAB — HEMOGLOBIN A1C: Mean Plasma Glucose: 180 mg/dL — ABNORMAL HIGH (ref <117)

## 2011-04-18 ENCOUNTER — Ambulatory Visit (INDEPENDENT_AMBULATORY_CARE_PROVIDER_SITE_OTHER): Payer: Medicare Other | Admitting: Family Medicine

## 2011-04-18 ENCOUNTER — Encounter: Payer: Self-pay | Admitting: Family Medicine

## 2011-04-18 VITALS — BP 132/80 | HR 82 | Temp 97.9°F | Ht 63.0 in | Wt 238.4 lb

## 2011-04-18 DIAGNOSIS — R7401 Elevation of levels of liver transaminase levels: Secondary | ICD-10-CM

## 2011-04-18 DIAGNOSIS — E039 Hypothyroidism, unspecified: Secondary | ICD-10-CM

## 2011-04-18 DIAGNOSIS — E119 Type 2 diabetes mellitus without complications: Secondary | ICD-10-CM

## 2011-04-18 DIAGNOSIS — G609 Hereditary and idiopathic neuropathy, unspecified: Secondary | ICD-10-CM

## 2011-04-18 DIAGNOSIS — G629 Polyneuropathy, unspecified: Secondary | ICD-10-CM

## 2011-04-18 DIAGNOSIS — Z9189 Other specified personal risk factors, not elsewhere classified: Secondary | ICD-10-CM

## 2011-04-18 DIAGNOSIS — E785 Hyperlipidemia, unspecified: Secondary | ICD-10-CM

## 2011-04-18 DIAGNOSIS — I10 Essential (primary) hypertension: Secondary | ICD-10-CM

## 2011-04-18 DIAGNOSIS — K219 Gastro-esophageal reflux disease without esophagitis: Secondary | ICD-10-CM

## 2011-04-18 LAB — T3, FREE: T3, Free: 2.9 pg/mL (ref 2.3–4.2)

## 2011-04-18 MED ORDER — LOVASTATIN 10 MG PO TABS: 10.0000 mg | ORAL_TABLET | Freq: Every day | ORAL | Status: DC

## 2011-04-18 MED ORDER — GABAPENTIN 100 MG PO TABS: ORAL_TABLET | ORAL | Status: DC

## 2011-04-18 NOTE — Patient Instructions (Signed)
Diabetes, Type 2 Diabetes is a lasting (chronic) disease. In type 2 diabetes, the pancreas does not make enough insulin (a hormone), and the body does not respond normally to the insulin that is made. This type of diabetes was also previously called adult onset diabetes. About 90% of all those who have diabetes have type 2. It usually occurs after the age of 53 but can occur at any age. CAUSES Unlike type 1 diabetes, which happens because insulin is no longer being made, type 2 diabetes happens because the body is making less insulin and has trouble using the insulin properly. SYMPTOMS  Drinking more than usual.   Urinating more than usual.   Blurred vision.   Dry, itchy skin.   Frequent infection like yeast infections in women.   More tired than usual (fatigue).  TREATMENT  Healthy eating.   Exercise.   Medication, if needed.   Monitoring blood glucose (sugar).   Seeing your caregiver regularly.  HOME CARE INSTRUCTIONS  Check your blood glucose (sugar) at least once daily. More frequent monitoring may be necessary, depending on your medications and on how well your diabetes is controlled. Your caregiver will advise you.   Take your medicine as directed by your caregiver.   Do not smoke.   Make wise food choices. Ask your caregiver for information. Weight loss can improve your diabetes.   Learn about low blood glucose (hypoglycemia) and how to treat it.   Get your eyes checked regularly.   Have a yearly physical exam. Have your blood pressure checked. Get your blood and urine tested.   Wear a pendant or bracelet saying that you have diabetes.   Check your feet every night for sores. Let your caregiver know if you have sores that are not healing.  SEEK MEDICAL CARE IF:  You are having problems keeping your blood glucose at target range.   You feel you might be having problems with your medicines.   You have symptoms of an illness that is not improving after 24  hours.   You have a sore or wound that is not healing.   You notice a change in vision or a new problem with your vision.   You develop a fever of more than 104.  Document Released: 12/12/2005 Document Re-Released: 01/03/2010 [facility] Patient Information 2011 [place], [place].  Increase flaxseed oil to 2 caps daily

## 2011-04-19 NOTE — Progress Notes (Signed)
Left a message on home answering machine stating lab results were normal

## 2011-04-20 ENCOUNTER — Encounter: Payer: Self-pay | Admitting: Family Medicine

## 2011-04-20 DIAGNOSIS — G629 Polyneuropathy, unspecified: Secondary | ICD-10-CM

## 2011-04-20 HISTORY — DX: Polyneuropathy, unspecified: G62.9

## 2011-04-20 NOTE — Assessment & Plan Note (Signed)
At her last visit we started Gabapentin it has helped some but she still has the burning sensation in both feet. She will try and increase to Gabapentin at 100mg  intervals to 300mg  as tolerated at bedtime and we will assess response at next visit.

## 2011-04-20 NOTE — Assessment & Plan Note (Signed)
Mild and improved since last blood draw, will continue to monitor and cont current meds, minimize simple carbs

## 2011-04-20 NOTE — Assessment & Plan Note (Signed)
Reports a great improvement on current meds, no changes at today's visit

## 2011-04-20 NOTE — Assessment & Plan Note (Signed)
Well controlled, no change in therapy, minimize sodium

## 2011-04-20 NOTE — Assessment & Plan Note (Signed)
Encouraged increased exercise, avoid trans fats and simple carbs. Get 7-8 hour sleep and eat small and frequent meals with lean proteins

## 2011-04-20 NOTE — Assessment & Plan Note (Signed)
Good response to Ranitidine, using med regularly, may cont to use prn and avoid offending foods

## 2011-04-20 NOTE — Progress Notes (Signed)
Stacey Cooper [id number] [birth year] 04/20/2011      Progress Note-Follow Up  Subjective  Chief Complaint  Chief Complaint  Patient presents with  . Diabetes    follow up    HPI  Patient is a 76 year old Caucasian female in today for followup on multiple medical problems. She reports a great improvement in her sugars. Fasting sugars are ranging between 98 to 118. No numbers below 95 and no numbers above 120 in the past month. Denies polyuria or polydipsia. She denies any recent illness, fevers, chills, congestion, shortness of breath, chest pain, palpitations, GI or GU complaints. She reports ranitidine has been very helpful and she's no longer having dyspepsia or abdominal pain. Takes it on a regular basis and is trying to avoid offending foods. Her peripheral neuropathy continues to bother her although the Neurontin has helped slightly. She's had no untoward side effects no hypersomnolence or difficulty with the medication  Past Medical History  Diagnosis Date  . Diabetes mellitus   . UTI'S, HX OF 01/24/2011  . URINARY INCONTINENCE 02/07/2011  . SQUAMOUS CELL CARCINOMA OTHER SPEC SITES SKIN 01/24/2011  . NONSPEC ELEVATION OF LEVELS OF TRANSAMINASE/LDH 02/07/2011  . Morbid obesity 01/24/2011  . MEASLES, HX OF 01/24/2011  . HAIR LOSS 01/24/2011  . GERD 01/24/2011  . Essential hypertension, benign 01/24/2011  . DM 01/24/2011  . CHOLELITHIASIS, HX OF 02/07/2011  . Basal cell carcinoma of skin of lip 01/24/2011  . ABDOMINAL PAIN, RIGHT UPPER QUADRANT, HX OF 01/24/2011    Past Surgical History  Procedure Date  . Abdominal hysterectomy     partial, both ovaries left in place  . Total knee replacement, b/l   . Cataract extraction, b/l   . Skin bx, right anterior cw , squamous cancer, had to redo to margins 12/2010  . Bcc removed below lower lip on left 26 yrs ago  . Left knee arthroscopy and cleaning prior to tkr     Family History  Problem Relation Age of Onset  . Arthritis Mother    RA  . Myelodysplastic syndrome Mother   . Cancer Father     skin  . Stroke Father   . Stroke Maternal Grandmother   . Kidney failure Maternal Grandfather     History   Social History  . Marital Status: Married    Spouse Name: N/A    Number of Children: N/A  . Years of Education: N/A   Occupational History  . Not on file.   Social History Main Topics  . Smoking status: Never Smoker   . Smokeless tobacco: Never Used  . Alcohol Use: No  . Drug Use: No  . Sexually Active: No   Other Topics Concern  . Not on file   Social History Narrative  . No narrative on file    Current Outpatient Prescriptions on File Prior to Visit  Medication Sig Dispense Refill  . Flaxseed, Linseed, (FLAXSEED OIL) 1000 MG CAPS Take 1 capsule by mouth daily.        . Lancets MISC by Does not apply route. Check blood sugars qam and once daily prn       . lisinopril-hydrochlorothiazide (PRINZIDE,ZESTORETIC) 10-12.5 MG per tablet Take 1 tablet by mouth daily.        . sitaGLIPtan (JANUVIA) 100 MG tablet Take 100 mg by mouth daily.        . ranitidine (ZANTAC) 300 MG tablet Take 300 mg by mouth daily.  Allergies  Allergen Reactions  . Metformin     REACTION: nausea, vomit, diarrhea    Review of Systems  Review of Systems  Constitutional: Negative for fever and malaise/fatigue.  HENT: Negative for congestion.   Eyes: Negative for discharge.  Respiratory: Negative for shortness of breath.   Cardiovascular: Negative for chest pain, palpitations and leg swelling.  Gastrointestinal: Negative for nausea, abdominal pain and diarrhea.  Genitourinary: Negative for dysuria.  Musculoskeletal: Negative for falls.  Skin: Negative for rash.  Neurological: Negative for loss of consciousness and headaches.  Endo/Heme/Allergies: Negative for polydipsia.  Psychiatric/Behavioral: Negative for depression and suicidal ideas. The patient is not nervous/anxious and does not have insomnia.      Objective  BP 132/80  Pulse 82  Temp(Src) 97.9 F (36.6 C) (Oral)  Ht 5\' 3"  (1.6 m)  Wt 238 lb 6.4 oz (108.138 kg)  BMI 42.23 kg/m2  SpO2 95%  Physical Exam  Physical Exam  Constitutional: She is oriented to person, place, and time and well-developed, well-nourished, and in no distress. No distress.  HENT:  Head: Normocephalic and atraumatic.  Eyes: Conjunctivae are normal.  Neck: Neck supple. No thyromegaly present.  Cardiovascular: Normal rate, regular rhythm and normal heart sounds.   No murmur heard. Pulmonary/Chest: Effort normal and breath sounds normal. She has no wheezes.  Abdominal: She exhibits no distension and no mass.  Musculoskeletal: She exhibits no edema.  Lymphadenopathy:    She has no cervical adenopathy.  Neurological: She is alert and oriented to person, place, and time.  Skin: Skin is warm and dry. No rash noted. She is not diaphoretic.  Psychiatric: Memory, affect and judgment normal.    Lab Results  Component Value Date   TSH 5.460* 04/13/2011   Lab Results  Component Value Date   WBC 10.1 04/13/2011   HGB 14.8 04/13/2011   HCT 43.7 04/13/2011   MCV 92.4 04/13/2011   PLT 198 04/13/2011   Lab Results  Component Value Date   CREATININE 0.80 04/13/2011   BUN 16 04/13/2011   NA 139 04/13/2011   K 4.1 04/13/2011   CL 105 04/13/2011   CO2 21 04/13/2011   Lab Results  Component Value Date   ALT 29 04/13/2011   AST 48* 04/13/2011   ALKPHOS 125* 04/13/2011   BILITOT 0.7 04/13/2011   Lab Results  Component Value Date   CHOL 208* 04/13/2011   Lab Results  Component Value Date   HDL 39* 04/13/2011   Lab Results  Component Value Date   LDLCALC 138* 04/13/2011   Lab Results  Component Value Date   TRIG 153* 04/13/2011   Lab Results  Component Value Date   CHOLHDL 5.3 04/13/2011     Assessment & Plan  NONSPEC ELEVATION OF LEVELS OF TRANSAMINASE/LDH Mild and improved since last blood draw, will continue to monitor and cont current meds,  minimize simple carbs  MORBID OBESITY Encouraged increased exercise, avoid trans fats and simple carbs. Get 7-8 hour sleep and eat small and frequent meals with lean proteins  GERD Good response to Ranitidine, using med regularly, may cont to use prn and avoid offending foods  ESSENTIAL HYPERTENSION, BENIGN Well controlled, no change in therapy, minimize sodium  DM Greatly improved since last blood draw but still mildly elevated, avoid simple carbs, increase exercise and we will recheck hgba1c in 3 months. She reports fasting sugars ranging from 98-118  ABDOMINAL PAIN, RIGHT UPPER QUADRANT, HX OF Reports a great improvement on current meds, no changes  at today's visit

## 2011-04-20 NOTE — Assessment & Plan Note (Signed)
Greatly improved since last blood draw but still mildly elevated, avoid simple carbs, increase exercise and we will recheck hgba1c in 3 months. She reports fasting sugars ranging from 98-118

## 2011-05-10 NOTE — Op Note (Signed)
NAME:  Stacey Cooper, PRUITT NO.:  [id number]   MEDICAL RECORD NO.:  [id number]          PATIENT TYPE:  INP   LOCATION:  5023                         FACILITY:  [facility]   PHYSICIAN:  Lubertha Basque. Dalldorf, M.D.DATE OF BIRTH:  Mar 23, 1935   DATE OF PROCEDURE:  05/27/2008  DATE OF DISCHARGE:                               OPERATIVE REPORT   PREOPERATIVE DIAGNOSIS:  Right knee degenerative arthritis.   POSTOPERATIVE DIAGNOSES:  Right knee degenerative arthritis.   PROCEDURE:  Right total knee replacement.   ANESTHESIA:  General and block.   ATTENDING SURGEON:  Lubertha Basque. Jerl Santos, MD   ASSISTANT:  Lindwood Qua, PA   INDICATIONS FOR PROCEDURE:  The patient is a 76 year old woman with a  long history of bilateral knee pain.  She has persisted with difficulty  despite oral anti-inflammatories and injectables.  She is status post a  successful knee replacement on the opposite side last year and wishes to  have the same procedure on this side.  Informed operative consent was  obtained, after discussion of possible complications or reactions to  anesthesia, infection, DVT, PE, and death.   SUMMARY FINDINGS AND PROCEDURE:  Under general anesthesia and a block, a  right knee replacement was performed.  We performed a standard anterior  medial parapatellar approach.  She had advanced degenerative change in  all three compartments.  She had good bone quality.  We addressed her  problem with a cemented DePuy LPS system using a standard femur, 4 MBT  tibial tray, 10-mm deep dish spacer, and 38-mm all polyethylene patella.  I did include antibiotic in the cement.  Bryna Colander assisted  throughout and was invaluable to the completion of the case.  He helped  position and retract while I performed the procedure.  He also closed  simultaneously to help minimize OR time.   DESCRIPTION OF PROCEDURE:  The patient was taken to operating suite  where general anesthetic was applied without  difficulty.  She was also  given a block in the preanesthesia area.  She was positioned supine and  prepped and draped in normal sterile fashion.  After administration of  IV Kefzol, the right leg was elevated, exsanguinated, and a tourniquet  inflated about the thigh.  A longitudinal anterior incision was made  with dissection down the extensor mechanism.  All appropriate anti-  infected measures were used including closed hooded exhaust systems for  each member of surgical team, Betadine impregnated drape, and the  preoperative IV antibiotic.  A medial parapatellar incision was made.  The kneecap was flipped and the knee flexed.  Some residual meniscal  tissues were removed along the ACL and PCL.  Findings were as noted  above.  We placed an intramedullary guide in the tibia to make a cut  with a very slight posterior tilt.  Our plan was to place the revision  MBT stent to address her stature.  The femur sized to a standard and the  tibia to a 4 and appropriate guides were placed and utilized.  The  patella was cut down in thickness by 9 mm  to 15 and sized to 38 with the  appropriate guide placed and utilized.  A trial reduction was done and  she easily came to full extension and flexed well.  The knee was felt to  be balanced.  We did make the appropriate anterior and posterior femoral  cuts followed by a distal cut creating an extension and flexion gap of  10 mm equal on both positions.  The trial components were removed  followed by pulsatile lavage irrigation of all three cut bony surfaces.  Cement was mixed including Zinacef and was pressurized onto the bone.  This was followed by placement of the aforementioned DePuy components  including the MBT tray.  Excess cement was trimmed and pressure was held  on components until the cement was hardened.  The tourniquet was  deflated and a small amount of bleeding was used controlled with Bovie  cautery.  The wound was again irrigated  followed by placement of drain  exiting superolaterally.  The extensor mechanism was reapproximated with  #1 Vicryl interrupted fashion followed by subcutaneous reapproximation  with 0 and 2-0 undyed Vicryl and skin closure with staples.  Adaptic was  applied followed by dry gauze and a loose Ace wrap.   ESTIMATED BLOOD LOSS AND FLUIDS:  Obtained from anesthesia records as  can accurate tourniquet time.   DISPOSITION:  The patient was extubated in the operating room and taken  to recovery room in stable condition.  She is to be admitted for  orthopedic surgery service for appropriate postop care to include  perioperative antibiotics and Coumadin plus Lovenox for DVT prophylaxis.      Lubertha Basque Jerl Santos, M.D.  Electronically Signed     PGD/MEDQ  D:  05/27/2008  T:  05/27/2008  Job:  [id number]

## 2011-05-10 NOTE — Discharge Summary (Signed)
NAME:  Stacey Cooper, NOVAKOWSKI NO.:  [id number]   MEDICAL RECORD NO.:  [id number]          PATIENT TYPE:  INP   LOCATION:  5023                         FACILITY:  [facility]   PHYSICIAN:  Lubertha Basque. Dalldorf, M.D.DATE OF BIRTH:  [birth year]   DATE OF ADMISSION:  05/27/2008  DATE OF DISCHARGE:  05/30/2008                               DISCHARGE SUMMARY   ADMITTING DIAGNOSES:  1. Right knee degenerative joint disease, severe.  2. History of hypertension.  3. Status post left total knee replacement, doing well.   DISCHARGE DIAGNOSES:  1. Right knee degenerative joint disease, severe.  2. History of hypertension.  3. Status post left total knee replacement, doing well.   OPERATION:  Right total knee replacement.   BRIEF HISTORY:  [name] is a 76 year old white female, patient well  known to our practice, who in previous years has had a left total knee  done, now the right side is bothering her, significantly she had been  having trouble walking.  __________ time x-rays reveal end-stage DJD.   PERTINENT LABORATORY AND X-RAY FINDINGS:  Hemoglobin 12.7.  INR on  discharge was 2.7.  Sodium 138, potassium 3.6, BUN 5, creatinine 0.75,  and glucose 127.  Chest x-ray, no active cardiopulmonary disease.   COURSE IN HOSPITAL:  The patient was admitted postoperatively, placed on  a variety of p.o. and IM analgesics for pain, PCA Dilaudid pump,  pharmacy for Coumadin and Lovenox DVT prophylaxis protocol, and then IV  Ancef 1 g q.8 h, and other p.o. medicines which included Stool  Softeners, laxatives p.r.n., ferrous sulfate, acetaminophen, oral pain  medications, also incorporated knee high TEDs, CPM machine 0-50 degrees  advance as tolerated, and incentive spirometry.  The patient progressed  well.  The first day postop, her vital signs were stable with blood  pressure 104/55 and temperature 98.  Lungs clear.  Abdomen soft and  remained that way throughout the hospital stay.  Her  Foley catheter was  discontinued the first day postop.  Her IVs were also heparin locked at  that point.  Second day postoperative, dressing was changed, drain was  pulled from her knee, and the wound was noted to be without significant  signs of infection.  Calf soft and nontender.  Negative Homans'.  She  worked well with physical therapy, weightbearing as tolerated, and was  discharged home.   CONDITION ON DISCHARGE:  Improved.   FOLLOWUP:  She will remain on her home medicines  lisinopril/hydrochlorothiazide 10/12.5 one a day.  Two additional  medicines were given; one is Percocet 1 or 2 q.4-6 h. p.r.n. pain as  needed and then Coumadin dose regulated by pharmacy, this would for 2  weeks for DVT prophylaxis.  She will be on a low-sodium and heart-  healthy diet.  May change her dressing daily.  Be weightbearing as  tolerated.  Return to the office in 2 weeks from  the date of surgery for staple removal or any problems that were to  arise.  We will see her sooner, calling [phone].  [facility]  for [facility]  and INRs.  These instructions were given  through Mr. Lindwood Qua, PA-C dictating for Ms. Ann Maki.      Lindwood Qua, P.A.      Lubertha Basque Jerl Santos, M.D.  Electronically Signed    MC/MEDQ  D:  05/30/2008  T:  05/30/2008  Job:  [id number]

## 2011-05-13 NOTE — Discharge Summary (Signed)
NAME:  Stacey Cooper, SCHLACHTER NO.:  [id number]   MEDICAL RECORD NO.:  [id number]          PATIENT TYPE:  INP   LOCATION:  5010                         FACILITY:  [facility]   PHYSICIAN:  Lubertha Basque. Dalldorf, M.D.DATE OF BIRTH:  08-20-1935   DATE OF ADMISSION:  03/06/2007  DATE OF DISCHARGE:  03/09/2007                               DISCHARGE SUMMARY   ADMISSION DIAGNOSES:  Left knee end-stage degenerative joint disease.   DISCHARGE DIAGNOSES:  Left knee end-stage degenerative joint disease.   BRIEF HISTORY:  Ms. Stacey Cooper is a 76 year old white female with  complaints of  left knee pain.  We had done an arthroscopy on her knee  in 1998, and she on x-ray now has end-stage degenerative joint disease,  bone-on-bone.  She has failed corticosteroid injections and oral anti-  inflammatory medicines.  She is having pain with every step she takes  and night pain.  We have discussed treatment options, that being total  knee replacement with the risks of anesthesia, infection, DVT and  possible death.   PERTINENT LABORATORY DATA AND X-RAY FINDINGS:  Urinalysis was normal.  Sodium 138, potassium 3.8, glucose 123, BUN 9, creatinine 0.67.  CBC:  Hemoglobin 11.5, platelets 230.  Serial INRs were done, as she was on  low-dose Coumadin protocol.  Radiology findings:  None listed in her  chart.   HOSPITAL COURSE:  The patient was admitted postoperatively and was given  a PCA morphine pump for control of her discomfort along with p.o.  medicines, including Robaxin for muscle relaxation, Percocet for pain,  Reglan IV for nausea, iron sulfate, acetaminophen, TED stockings,  incentive spirometry, and a CPM machine for motion, and physical therapy  for weightbearing as tolerated.  She was on no home medications coming  to the hospital.  The first day postoperatively, her blood pressure was  117/65, temperature 97, hemoglobin 11.4, WBC 16.4.  During her hospital  stay, the first two days her WBCs  tended to rise, and then on the third  day it started to decline.  There was no obvious reason for this, as her  urine was normal, her lungs were clear to A&P, she was not coughing,  there was no sputum production.  The wound was normal on dressing  changes without sign of redness or infection or even an effusion.  Dressing was changed and Hemovac drain pulled the second day  postoperatively.  The Foley catheter stopped the first day  postoperatively.  She was working well with physical therapy, and  arrangements for home therapy were made.  She was discharged home.   CONDITION ON DISCHARGE:  Improved.   DISCHARGE INSTRUCTIONS:  She will return to our office in 7 to 10 days.  She is on a low sodium, heart healthy diet.  She did have some mild  increase of blood sugar during her hospitalization, and we wanted her to  check with her primary care physician 2 to 3 weeks after discharge from  the hospital for blood sugars.  She may change her dressing daily.  She  can be with crutches or walker, weightbearing  as tolerated.  She may  shower and get this incision wet.   DISCHARGE MEDICATIONS:  She is given three prescriptions  1. Percocet one every 4 to 6 hours for pain.  2. Coumadin dose regulated by pharmacy for low-dose Coumadin protocol.  3. Robaxin 750 one ever 8 hours as needed for spasm.   [facility] was going to provide protimes and physical therapy  for her at home and also if there was any infection which would be  increasing redness, drainage, increasing pain, to call our office  immediately at [phone] and that we would see her.      Lindwood Qua, P.A.      Lubertha Basque Jerl Santos, M.D.  Electronically Signed    MC/MEDQ  D:  03/09/2007  T:  03/10/2007  Job:  [id number]

## 2011-05-13 NOTE — Op Note (Signed)
NAME:  Stacey Cooper, Stacey Cooper NO.:  [id number]   MEDICAL RECORD NO.:  [id number]          PATIENT TYPE:  INP   LOCATION:  5010                         FACILITY:  [facility]   PHYSICIAN:  Lubertha Basque. Dalldorf, M.D.DATE OF BIRTH:  Dec 31, 1934   DATE OF PROCEDURE:  03/07/2007  DATE OF DISCHARGE:                               OPERATIVE REPORT   PREOPERATIVE DIAGNOSIS:  Left knee degenerative arthritis.   POSTOPERATIVE DIAGNOSIS:  Left knee degenerative arthritis.   PROCEDURE:  Left total knee replacement.   ANESTHESIA:  General and block.   ATTENDING SURGEON:  Lubertha Basque. Jerl Santos, M.D.   ASSISTANT:  Lindwood Qua, P.A.   INDICATIONS FOR PROCEDURE:  The patient is a 77 year old woman with a  long history of left knee pain and degenerative arthritis.  She has had  an arthroscopy about 10 or 12 years ago, which did reveal some moderate  degenerative change.  Unfortunately, this has progressed and she has end-  stage degeneration on standing x-rays of her knees.  She has failed oral  anti-inflammatories and injectables.  She has pain which limits her  ability to walk and rest, and she is offered a knee replacement.  Informed operative consent was obtained discussion of the possible  complications of reaction to anesthesia, infection, DVT, PE, and death.   SUMMARY OF FINDINGS AND PROCEDURE:  Under general anesthesia and a  block, a left knee replacement was performed.  She had advanced  degenerative change in all three compartments.  She had good bone  quality.  We addressed her problem with a cemented DePuy LCS system  using a standard femur with a 4 MBT tray with a stem.  We used a size 15  spacer with a 38 mm all-polyethylene patella.  Bryna Colander assisted  throughout and was invaluable to the completion of the case in that he  helped position and retract while I performed the procedure.  He also  closed simultaneously to help minimize OR time.   DESCRIPTION OF PROCEDURE:   The patient was taken to the operating suite,  where a general anesthetic was applied without difficulty.  She was also  given a block in the preanesthesia area.  She was positioned supine and  prepped and draped in normal sterile fashion.  After the administration  of preop IV Kefzol, the left leg was elevated, exsanguinated, and a  tourniquet inflated about the thigh.  A longitudinal anterior incision  was made with dissection down to the extensor mechanism.  All  appropriate anti-infective measures were used including closed hooded  exhaust systems for each member of the surgical team, preoperative IV  antibiotic, and Betadine-impregnated drape.  A medial parapatellar  incision was made and the kneecap was flipped and the knee flexed.  There was some significant adipose tissue but exposure was not  particularly difficult.  We removed some residual meniscal tissues as  well as the ACL and PCL.  I then placed an MBT tibial guide on the tibia  and utilized this to make a cut with a very slight posterior tilt on the  superior aspect of  the tibia.  The femur sized to a standard component  and the appropriate guide was placed and utilized, making anterior and  posterior cuts, creating a flexion gap of 15 mm.  We then placed a  second intramedullary guide in the femur to make a distal cut, creating  an equal extension gap of 15 mm, balancing the knee.  No soft tissue  releases were required.  The tibia sized to a #4 and we placed the  appropriate guide and reamed for a stemmed component.  We elected to  perform the MBT revision tibial tray due to her fairly large size.  A  trial reduction was done with these components and the 15 spacer, and  she easily came to slight hyperextension and flexed well.  The patella  tracked well and no lateral release was required.  The patella was cut  down in thickness by about 10 mm to 14 and sized to a 38 with the  appropriate guide placed and utilized.   Trial components were removed,  followed by pulsatile lavage irrigation of all three cut bony surfaces.  Cement was mixed including Zinacef antibiotic.  This cement was  pressurized onto all three bones, followed by placement of the  aforementioned DePuy components.  Excess cement was trimmed and pressure  was held on the components until the cement had hardened.  The  tourniquet was deflated and a small amount of bleeding was easily  controlled with Bovie cautery.  A drain was placed exiting  superolaterally and after thorough irrigation I reapproximated the  extensor mechanism along with Bryna Colander using #1 Vicryl in  interrupted fashion.  Subcutaneous tissues were reapproximated with 0  and 2-0 undyed Vicryl followed by skin closure with staples.  The knee  easily flexed to 120 against gravity at the end of the case.  Adaptic  was applied followed by dry gauze and a loose Ace wrap.  Estimated blood  loss and intraoperative fluids can be obtained from anesthesia records,  as can accurate tourniquet time.   DISPOSITION:  The patient was extubated in the operating room and taken  to the recovery room in stable addition.  She was to be admitted to the  orthopedic surgery service for appropriate postop care to include  perioperative antibiotics and Coumadin plus Lovenox for DVT prophylaxis.      Lubertha Basque Jerl Santos, M.D.  Electronically Signed     PGD/MEDQ  D:  03/06/2007  T:  03/08/2007  Job:  [id number]

## 2011-05-18 ENCOUNTER — Other Ambulatory Visit: Payer: Medicare Other

## 2011-05-20 ENCOUNTER — Other Ambulatory Visit (INDEPENDENT_AMBULATORY_CARE_PROVIDER_SITE_OTHER): Payer: Medicare Other

## 2011-05-20 DIAGNOSIS — R7401 Elevation of levels of liver transaminase levels: Secondary | ICD-10-CM

## 2011-05-20 LAB — HEPATIC FUNCTION PANEL
ALT: 34 U/L (ref 0–35)
AST: 60 U/L — ABNORMAL HIGH (ref 0–37)
Alkaline Phosphatase: 113 U/L (ref 39–117)
Total Bilirubin: 0.7 mg/dL (ref 0.3–1.2)

## 2011-07-07 ENCOUNTER — Other Ambulatory Visit: Payer: Self-pay | Admitting: Dermatology

## 2011-07-11 ENCOUNTER — Other Ambulatory Visit (INDEPENDENT_AMBULATORY_CARE_PROVIDER_SITE_OTHER): Payer: Medicare Other

## 2011-07-11 DIAGNOSIS — E785 Hyperlipidemia, unspecified: Secondary | ICD-10-CM

## 2011-07-11 DIAGNOSIS — K219 Gastro-esophageal reflux disease without esophagitis: Secondary | ICD-10-CM

## 2011-07-11 DIAGNOSIS — R7401 Elevation of levels of liver transaminase levels: Secondary | ICD-10-CM

## 2011-07-11 DIAGNOSIS — I10 Essential (primary) hypertension: Secondary | ICD-10-CM

## 2011-07-11 DIAGNOSIS — E119 Type 2 diabetes mellitus without complications: Secondary | ICD-10-CM

## 2011-07-11 LAB — RENAL FUNCTION PANEL
Albumin: 4 g/dL (ref 3.5–5.2)
BUN: 14 mg/dL (ref 6–23)
Chloride: 96 mEq/L (ref 96–112)
Creatinine, Ser: 0.8 mg/dL (ref 0.4–1.2)
GFR: 73.5 mL/min (ref >60.00)
Phosphorus: 3.5 mg/dL (ref 2.3–4.6)

## 2011-07-11 LAB — CBC WITH DIFFERENTIAL/PLATELET
Basophils Absolute: 0 10*3/uL (ref 0.0–0.1)
Basophils Relative: 0.6 % (ref 0.0–3.0)
Eosinophils Absolute: 0.1 10*3/uL (ref 0.0–0.7)
HCT: 43.6 % (ref 36.0–46.0)
Hemoglobin: 14.9 g/dL (ref 12.0–15.0)
Lymphocytes Relative: 19.5 % (ref 12.0–46.0)
Lymphs Abs: 1.7 10*3/uL (ref 0.7–4.0)
MCHC: 34.2 g/dL (ref 30.0–36.0)
MCV: 94.6 fl (ref 78.0–100.0)
Neutro Abs: 6 10*3/uL (ref 1.4–7.7)
RBC: 4.61 Mil/uL (ref 3.87–5.11)
RDW: 13.5 % (ref 11.5–14.6)

## 2011-07-11 LAB — LIPID PANEL
Cholesterol: 155 mg/dL (ref 0–200)
Triglycerides: 107 mg/dL (ref 0.0–149.0)

## 2011-07-11 LAB — HEPATIC FUNCTION PANEL
ALT: 29 U/L (ref 0–35)
AST: 46 U/L — ABNORMAL HIGH (ref 0–37)
Albumin: 4 g/dL (ref 3.5–5.2)
Total Bilirubin: 1 mg/dL (ref 0.3–1.2)
Total Protein: 7.5 g/dL (ref 6.0–8.3)

## 2011-07-12 LAB — MICROALBUMIN / CREATININE URINE RATIO
Creatinine,U: 144.3 mg/dL
Microalb, Ur: 0.5 mg/dL (ref 0.0–1.9)

## 2011-07-18 ENCOUNTER — Ambulatory Visit (INDEPENDENT_AMBULATORY_CARE_PROVIDER_SITE_OTHER): Payer: Medicare Other | Admitting: Family Medicine

## 2011-07-18 ENCOUNTER — Encounter: Payer: Self-pay | Admitting: Family Medicine

## 2011-07-18 DIAGNOSIS — L570 Actinic keratosis: Secondary | ICD-10-CM

## 2011-07-18 DIAGNOSIS — E119 Type 2 diabetes mellitus without complications: Secondary | ICD-10-CM

## 2011-07-18 DIAGNOSIS — K219 Gastro-esophageal reflux disease without esophagitis: Secondary | ICD-10-CM

## 2011-07-18 DIAGNOSIS — I10 Essential (primary) hypertension: Secondary | ICD-10-CM

## 2011-07-18 DIAGNOSIS — G629 Polyneuropathy, unspecified: Secondary | ICD-10-CM

## 2011-07-18 DIAGNOSIS — G609 Hereditary and idiopathic neuropathy, unspecified: Secondary | ICD-10-CM

## 2011-07-18 DIAGNOSIS — C44320 Squamous cell carcinoma of skin of unspecified parts of face: Secondary | ICD-10-CM

## 2011-07-18 DIAGNOSIS — R7401 Elevation of levels of liver transaminase levels: Secondary | ICD-10-CM

## 2011-07-18 HISTORY — DX: Actinic keratosis: L57.0

## 2011-07-18 HISTORY — DX: Squamous cell carcinoma of skin of unspecified parts of face: C44.320

## 2011-07-18 MED ORDER — SITAGLIP PHOS-METFORMIN HCL ER 100-1000 MG PO TB24: 1.0000 | ORAL_TABLET | Freq: Every day | ORAL | Status: DC

## 2011-07-18 NOTE — Assessment & Plan Note (Signed)
Patient chose to stop the Gabapentin secondary to excessive depression. All in all she feels that the pain is now does not want to proceed with any further medications at this time.

## 2011-07-18 NOTE — Assessment & Plan Note (Signed)
Improved with recent labs. 

## 2011-07-18 NOTE — Progress Notes (Signed)
Stacey Cooper [id number] 04/24/1935 07/18/2011      Progress Note-Follow Up  Subjective  Chief Complaint  Chief Complaint  Patient presents with  . Hypothyroidism  . Diabetes    pt thinks sugars are up    HPI  Patient is a 76 yo Caucasian female in today for follow up on multiple medical problems. She reports having been under a great deal of stress lately secondary to her recent cancer diagnosis. She has been seen Dr. Meredith Mody have her up for dermatology and they did 2 biopsies on the right side of her face. One above her lip was actinic keratosis of the one on her right cheek was squamous cell. She has an appointment for Mohs procedure tomorrow and his nurse. He also recommended she undergo some acid in the light therapy treatments for her diffuse actinic keratosis noted throughout her face as well as in other spots on her body. She is likely to proceed with this but does procedures would not be scheduled until the fall. No recent illness, fevers, chills, chest pain, palpitations, shortness of breath, GI or GU complaints. Heartburn recently well-controlled. Her blood sugars have been trending up slightly well still remaining in the low 100s.  Past Medical History  Diagnosis Date  . Diabetes mellitus   . UTI'S, HX OF 01/24/2011  . URINARY INCONTINENCE 02/07/2011  . SQUAMOUS CELL CARCINOMA OTHER SPEC SITES SKIN 01/24/2011  . NONSPEC ELEVATION OF LEVELS OF TRANSAMINASE/LDH 02/07/2011  . Morbid obesity 01/24/2011  . MEASLES, HX OF 01/24/2011  . HAIR LOSS 01/24/2011  . GERD 01/24/2011  . Essential hypertension, benign 01/24/2011  . DM 01/24/2011  . CHOLELITHIASIS, HX OF 02/07/2011  . Basal cell carcinoma of skin of lip 01/24/2011  . ABDOMINAL PAIN, RIGHT UPPER QUADRANT, HX OF 01/24/2011  . Peripheral neuropathy 04/20/2011  . SCC (squamous cell carcinoma), face 07/18/2011  . Actinic keratoses 07/18/2011    Past Surgical History  Procedure Date  . Abdominal hysterectomy     partial, both ovaries  left in place  . Total knee replacement, b/l   . Cataract extraction, b/l   . Skin bx, right anterior cw , squamous cancer, had to redo to margins 12/2010  . Bcc removed below lower lip on left 26 yrs ago  . Left knee arthroscopy and cleaning prior to tkr     Family History  Problem Relation Age of Onset  . Arthritis Mother     RA  . Myelodysplastic syndrome Mother   . Cancer Father     skin  . Stroke Father   . Stroke Maternal Grandmother   . Kidney failure Maternal Grandfather     History   Social History  . Marital Status: Married    Spouse Name: N/A    Number of Children: N/A  . Years of Education: N/A   Occupational History  . Not on file.   Social History Main Topics  . Smoking status: Never Smoker   . Smokeless tobacco: Never Used  . Alcohol Use: No  . Drug Use: No  . Sexually Active: No   Other Topics Concern  . Not on file   Social History Narrative  . No narrative on file    Current Outpatient Prescriptions on File Prior to Visit  Medication Sig Dispense Refill  . Flaxseed, Linseed, (FLAXSEED OIL) 1000 MG CAPS Take 1 capsule by mouth daily.        . Lancets MISC by Does not apply route. Check blood sugars qam  and once daily prn       . lisinopril-hydrochlorothiazide (PRINZIDE,ZESTORETIC) 10-12.5 MG per tablet Take 1 tablet by mouth daily.        Marland Kitchen lovastatin (MEVACOR) 10 MG tablet Take 1 tablet (10 mg total) by mouth daily.  30 tablet  3    Allergies  Allergen Reactions  . Metformin     REACTION: nausea, vomit, diarrhea    Review of Systems  Review of Systems  Constitutional: Negative for fever and malaise/fatigue.  HENT: Negative for congestion.   Eyes: Negative for discharge.  Respiratory: Negative for shortness of breath.   Cardiovascular: Negative for chest pain, palpitations and leg swelling.  Gastrointestinal: Negative for nausea, abdominal pain and diarrhea.  Genitourinary: Negative for dysuria.  Musculoskeletal: Negative for falls.   Skin: Negative for rash.       2 bx on face, 1 benign, 1 on right cheek SCC, has Mohs procedure scheduled for tomorrow  Neurological: Negative for loss of consciousness and headaches.  Endo/Heme/Allergies: Negative for polydipsia.  Psychiatric/Behavioral: Negative for depression and suicidal ideas. The patient is not nervous/anxious and does not have insomnia.     Objective  BP 126/74  Pulse 75  Ht 5\' 3"  (1.6 m)  Wt 232 lb (105.235 kg)  BMI 41.10 kg/m2  SpO2 95%  Physical Exam  Physical Exam  Constitutional: She is oriented to person, place, and time. She appears well-developed and well-nourished. No distress.  HENT:  Head: Normocephalic and atraumatic.  Right Ear: External ear normal.  Nose: Nose normal.  Eyes: EOM are normal. Right eye exhibits no discharge. Left eye exhibits no discharge. No scleral icterus.  Neck: Normal range of motion. Neck supple. No thyromegaly present.  Cardiovascular: Normal rate, regular rhythm and normal heart sounds.   No murmur heard. Pulmonary/Chest: Effort normal. No respiratory distress. She has no wheezes.  Abdominal: Soft. Bowel sounds are normal. She exhibits no mass. There is no tenderness.  Musculoskeletal: She exhibits no edema and no tenderness.  Neurological: She is alert and oriented to person, place, and time. She has normal reflexes.  Skin: Skin is warm and dry. She is not diaphoretic.       Healing excision site on right cheek, no surrounding erythema or fluctuance, lesion closed.     Lab Results  Component Value Date   TSH 1.30 07/11/2011   Lab Results  Component Value Date   WBC 8.7 07/11/2011   HGB 14.9 07/11/2011   HCT 43.6 07/11/2011   MCV 94.6 07/11/2011   PLT 157.0 07/11/2011   Lab Results  Component Value Date   CREATININE 0.8 07/11/2011   BUN 14 07/11/2011   NA 134* 07/11/2011   K 3.9 07/11/2011   CL 96 07/11/2011   CO2 25 07/11/2011   Lab Results  Component Value Date   ALT 29 07/11/2011   AST 46* 07/11/2011    ALKPHOS 112 07/11/2011   BILITOT 1.0 07/11/2011   Lab Results  Component Value Date   CHOL 155 07/11/2011   Lab Results  Component Value Date   HDL 43.70 07/11/2011   Lab Results  Component Value Date   LDLCALC 90 07/11/2011   Lab Results  Component Value Date   TRIG 107.0 07/11/2011   Lab Results  Component Value Date   CHOLHDL 4 07/11/2011     Assessment & Plan  ESSENTIAL HYPERTENSION, BENIGN Well controlled on current meds, no changes today  SCC (squamous cell carcinoma), face Patient is now following with Dr Joen Laura  Mohs procedure is pursued. Meredith Mody for dermatology and had 2 biopsies one near her lip and one on her right cheek recently. The right cheek lesion was squamous cell and she is scheduled for Mohs procedure tomorrow. She is anxious but ready to proceed and she is offered reassurance that most Mohs procedures have very good results  GERD No c/o today, avoid offending foods  DM HGBA1C up slightly today, she is encouraged to minimize simple carbs, and she is given samples of Janumet XR 100/1000 and asked to take one daily to increase her medications. Her previous reaction was nausea and some vomiting on Metformin but it was just on the regular acting. She will let up know if she does not tolerate this change so we can try something different  Peripheral neuropathy Patient chose to stop the Gabapentin secondary to excessive depression. All in all she feels that the pain is now does not want to proceed with any further medications at this time.  NONSPEC ELEVATION OF LEVELS OF TRANSAMINASE/LDH Improved with recent labs

## 2011-07-18 NOTE — Assessment & Plan Note (Signed)
Well controlled on current meds, no changes today 

## 2011-07-18 NOTE — Assessment & Plan Note (Signed)
No c/o today, avoid offending foods

## 2011-07-18 NOTE — Assessment & Plan Note (Signed)
Patient is now following with Dr Joen Laura Mohs procedure is pursued. Stacey Cooper for dermatology and had 2 biopsies one near her lip and one on her right cheek recently. The right cheek lesion was squamous cell and she is scheduled for Mohs procedure tomorrow. She is anxious but ready to proceed and she is offered reassurance that most Mohs procedures have very good results

## 2011-07-18 NOTE — Patient Instructions (Signed)
Diabetes Meal Planning Guide The diabetes meal planning guide is a tool to help you plan your meals and snacks. It is important for people with diabetes to manage their blood sugar levels. Choosing the right foods and the right amounts throughout your day will help control your blood sugar. Eating right can even help you improve your blood pressure and reach or maintain a healthy weight. CARBOHYDRATE COUNTING MADE EASY When you eat carbohydrates, they turn to sugar (glucose). This raises your blood sugar level. Counting carbohydrates can help you control this level so you feel better. When you plan your meals by counting carbohydrates, you can have more flexibility in what you eat and balance your medicine with your food intake. Carbohydrate counting simply means adding up the total amount of carbohydrate grams (g) in your meals or snacks. Try to eat about the same amount at each meal. Foods with carbohydrates are listed below. Each portion below is 1 carbohydrate serving or 15 grams of carbohydrates. Ask your dietician how many grams of carbohydrates you should eat at each meal or snack. Grains and Starches 1 slice bread 1/2 English muffin or hotdog/hamburger bun 3/4 cup cold cereal (unsweetened) 1/3 cup cooked pasta or rice 1/2 cup starchy vegetables (corn, potatoes, peas, beans, winter squash) 1 tortilla (6 inches) 1/4 bagel 1 waffle or pancake (size of a CD) 1/2 cup cooked cereal 4 to 6 small crackers *Whole grain is recommended Fruit 1 cup fresh unsweetened berries, melon, papaya, pineapple 1 small fresh fruit 1/2 banana or mango 1/2 cup fruit juice (4 ounces unsweetened) 1/2 cup canned fruit in natural juice or water 2 tablespoons dried fruit 12 to 15 grapes or cherries Milk and Yogurt 1 cup fat-free or 1% milk 1 cup soy milk 6 ounces light yogurt with sugar-free sweetener 6 ounces low-fat soy yogurt 6 ounces plain yogurt Vegetables 1 cup raw or 1/2 cup cooked is counted as 0  carbohydrates or a "free" food. If you eat 3 or more servings at one meal, count them as 1 carbohydrate serving. Other Carbohydrates 3/4 ounces chips or pretzels 1/2 cup ice cream or frozen yogurt 1/4 cup sherbet or sorbet 2 inch square cake, no frosting 1 tablespoon honey, sugar, jam, jelly, or syrup 2 small cookies 3 squares of graham crackers 3 cups popcorn 6 crackers 1 cup broth-based soup Count 1 cup casserole or other mixed foods as 2 carbohydrate servings. Foods with less than 20 calories in a serving may be counted as 0 carbohydrates or a "free" food. You may want to purchase a book or computer software that lists the carbohydrate gram counts of different foods. In addition, the nutrition facts panel on the labels of the foods you eat are a good source of this information. The label will tell you how big the serving size is and the total number of carbohydrate grams you will be eating per serving. Divide this number by 15 to obtain the number of carbohydrate servings in a portion. Remember: 1 carbohydrate serving equals 15 grams of carbohydrate. SERVING SIZES Measuring foods and serving sizes helps you make sure you are getting the right amount of food. The list below tells how big or small some common serving sizes are.  1 ounce (oz) of cheese.................................4 stacked dice.   2 to 3 oz cooked meat..................................Deck of cards.   1 teaspoon (tsp)............................................Tip of little finger.   1 tablespoon (tbs).........................................Thumb.   2 tbs.............................................................Golf ball.    cup...........................................................Half of a fist.   1 cup............................................................A fist.    SAMPLE DIABETES MEAL PLAN Below is a sample meal plan that includes foods from the grain and starches, dairy, vegetable, fruit, and  meat groups. A dietician can individualize a meal plan to fit your calorie needs and tell you the number of servings needed from each food group. However, controlling the total amount of carbohydrates in your meal or snack is more important than making sure you include all of the food groups at every meal. You may interchange carbohydrate containing foods (dairy, starches, and fruits). The meal plan below is an example of a 2000 calorie diet using carbohydrate counting. This meal plan has 17 carbohydrate servings (carb choices). Breakfast 1 cup oatmeal (2 carb choices) 3/4 cup light yogurt (1 carb choice) 1 cup blueberries (1 carb choice) 1/4 cup almonds  Snack 1 large apple (2 carb choices) 1 low-fat string cheese stick  Lunch Chicken breast salad:  1 cup spinach   1/4 cup chopped tomatoes   2 oz chicken breast, sliced   2 tbs low-fat Italian dressing  12 whole-wheat crackers (2 carb choices) 12 to 15 grapes (1 carb choice) 1 cup low-fat milk (1 carb choice)  Snack 1 cup carrots 1/2 cup hummus (1 carb choice)  Dinner 3 oz broiled salmon 1 cup brown rice (3 carb choices)  Snack 1 1/2 cups steamed broccoli (1 carb choice) drizzled with 1 tsp olive oil and lemon juice 1 cup light pudding (2 carb choices)  DIABETES MEAL PLANNING WORKSHEET Your dietician can use this worksheet to help you decide how many servings of foods and what types of foods are right for you.  Breakfast Food Group and Servings Carb Choices Grain/Starches _______________________________________ Dairy ______________________________________________ Vegetable _______________________________________ Fruit _______________________________________________ Meat _______________________________________________ Fat _____________________________________________ Lunch Food Group and Servings Carb Choices Grain/Starches ________________________________________ Dairy _______________________________________________ Fruit  ________________________________________________ Meat ________________________________________________ Fat _____________________________________________ Dinner Food Group and Servings Carb Choices Grain/Starches ________________________________________ Dairy _______________________________________________ Fruit ________________________________________________ Meat ________________________________________________ Fat _____________________________________________ Snacks Food Group and Servings Carb Choices Grain/Starches ________________________________________ Dairy _______________________________________________ Vegetable ________________________________________ Fruit ________________________________________________ Meat ________________________________________________ Fat _____________________________________________ Daily Totals Starches _________________________ Vegetable __________________________ Fruit ______________________________ Dairy ______________________________ Meat ______________________________ Fat ________________________________  Document Released: 09/08/2005 Document Re-Released: 06/01/2010 [employer] Patient Information 2011 [employer]. 

## 2011-07-18 NOTE — Assessment & Plan Note (Signed)
HGBA1C up slightly today, she is encouraged to minimize simple carbs, and she is given samples of Janumet XR 100/1000 and asked to take one daily to increase her medications. Her previous reaction was nausea and some vomiting on Metformin but it was just on the regular acting. She will let up know if she does not tolerate this change so we can try something different

## 2011-08-30 ENCOUNTER — Other Ambulatory Visit: Payer: Self-pay

## 2011-08-30 MED ORDER — LISINOPRIL-HYDROCHLOROTHIAZIDE 10-12.5 MG PO TABS: 1.0000 | ORAL_TABLET | Freq: Every day | ORAL | Status: DC

## 2011-09-09 ENCOUNTER — Other Ambulatory Visit: Payer: Self-pay

## 2011-09-09 DIAGNOSIS — E785 Hyperlipidemia, unspecified: Secondary | ICD-10-CM

## 2011-09-09 MED ORDER — LISINOPRIL-HYDROCHLOROTHIAZIDE 10-12.5 MG PO TABS: 1.0000 | ORAL_TABLET | Freq: Every day | ORAL | Status: DC

## 2011-09-09 MED ORDER — LOVASTATIN 10 MG PO TABS: 10.0000 mg | ORAL_TABLET | Freq: Every day | ORAL | Status: DC

## 2011-09-21 LAB — BASIC METABOLIC PANEL
CO2: 24
Calcium: 9.6
GFR calc Af Amer: >60
GFR calc non Af Amer: >60
Potassium: 3.5
Sodium: 137

## 2011-09-21 LAB — CBC
Platelets: 236
RBC: 5.09
WBC: 12.9 — ABNORMAL HIGH

## 2011-09-22 LAB — BASIC METABOLIC PANEL
BUN: 5 — ABNORMAL LOW
CO2: 24
Calcium: 8.7
Calcium: 8.8
Chloride: 101
Chloride: 101
Creatinine, Ser: 0.88
GFR calc Af Amer: >60
GFR calc non Af Amer: 39 — ABNORMAL LOW
GFR calc non Af Amer: >60
Potassium: 3.6
Potassium: 3.7
Sodium: 136
Sodium: 138
Sodium: 138

## 2011-09-22 LAB — CBC
HCT: 33.7 — ABNORMAL LOW
HCT: 35.9 — ABNORMAL LOW
Hemoglobin: 11.9 — ABNORMAL LOW
Hemoglobin: 12.3
Hemoglobin: 12.8
MCHC: 35.2
MCV: 89.8
Platelets: 198
Platelets: 207
RBC: 3.75 — ABNORMAL LOW
RBC: 3.88
WBC: 15.7 — ABNORMAL HIGH
WBC: 15.9 — ABNORMAL HIGH
WBC: 17.4 — ABNORMAL HIGH

## 2011-09-22 LAB — PROTIME-INR
INR: 1
INR: 1.1
INR: 1.5
Prothrombin Time: 13.3
Prothrombin Time: 18.6 — ABNORMAL HIGH

## 2011-10-18 ENCOUNTER — Other Ambulatory Visit (INDEPENDENT_AMBULATORY_CARE_PROVIDER_SITE_OTHER): Payer: Medicare Other

## 2011-10-18 DIAGNOSIS — E039 Hypothyroidism, unspecified: Secondary | ICD-10-CM

## 2011-10-18 DIAGNOSIS — E119 Type 2 diabetes mellitus without complications: Secondary | ICD-10-CM

## 2011-10-18 DIAGNOSIS — I10 Essential (primary) hypertension: Secondary | ICD-10-CM

## 2011-10-18 LAB — RENAL FUNCTION PANEL
Albumin: 3.9 g/dL (ref 3.5–5.2)
Chloride: 101 mEq/L (ref 96–112)
GFR: 66.74 mL/min (ref >60.00)
Glucose, Bld: 146 mg/dL — ABNORMAL HIGH (ref 70–99)
Phosphorus: 3.8 mg/dL (ref 2.3–4.6)
Potassium: 4.8 mEq/L (ref 3.5–5.1)

## 2011-10-18 LAB — HEPATIC FUNCTION PANEL
ALT: 29 U/L (ref 0–35)
Albumin: 3.9 g/dL (ref 3.5–5.2)
Bilirubin, Direct: 0.2 mg/dL (ref 0.0–0.3)
Total Protein: 7.3 g/dL (ref 6.0–8.3)

## 2011-10-18 LAB — CBC
MCH: 31.3 pg (ref 26.0–34.0)
MCV: 93.5 fL (ref 78.0–100.0)
Platelets: 218 10*3/uL (ref 150–400)
RBC: 4.64 MIL/uL (ref 3.87–5.11)
RDW: 13.2 % (ref 11.5–15.5)
WBC: 10.6 10*3/uL — ABNORMAL HIGH (ref 4.0–10.5)

## 2011-10-18 LAB — LIPID PANEL
Cholesterol: 156 mg/dL (ref 0–200)
HDL: 42.6 mg/dL (ref >39.00)
LDL Cholesterol: 91 mg/dL (ref 0–99)
Triglycerides: 114 mg/dL (ref 0.0–149.0)
VLDL: 22.8 mg/dL (ref 0.0–40.0)

## 2011-10-18 LAB — HEMOGLOBIN A1C: Hgb A1c MFr Bld: 7 % — ABNORMAL HIGH (ref 4.6–6.5)

## 2011-10-20 ENCOUNTER — Ambulatory Visit (INDEPENDENT_AMBULATORY_CARE_PROVIDER_SITE_OTHER): Payer: Medicare Other

## 2011-10-20 ENCOUNTER — Ambulatory Visit: Payer: Medicare Other

## 2011-10-20 DIAGNOSIS — Z23 Encounter for immunization: Secondary | ICD-10-CM

## 2011-12-08 ENCOUNTER — Ambulatory Visit (INDEPENDENT_AMBULATORY_CARE_PROVIDER_SITE_OTHER): Payer: Medicare Other | Admitting: Family Medicine

## 2011-12-08 ENCOUNTER — Encounter: Payer: Self-pay | Admitting: Family Medicine

## 2011-12-08 DIAGNOSIS — I10 Essential (primary) hypertension: Secondary | ICD-10-CM

## 2011-12-08 DIAGNOSIS — M533 Sacrococcygeal disorders, not elsewhere classified: Secondary | ICD-10-CM

## 2011-12-08 DIAGNOSIS — M999 Biomechanical lesion, unspecified: Secondary | ICD-10-CM

## 2011-12-08 DIAGNOSIS — E119 Type 2 diabetes mellitus without complications: Secondary | ICD-10-CM

## 2011-12-08 DIAGNOSIS — M549 Dorsalgia, unspecified: Secondary | ICD-10-CM

## 2011-12-08 HISTORY — DX: Dorsalgia, unspecified: M54.9

## 2011-12-08 MED ORDER — CYCLOBENZAPRINE HCL 10 MG PO TABS: 10.0000 mg | ORAL_TABLET | Freq: Two times a day (BID) | ORAL | Status: DC | PRN

## 2011-12-08 MED ORDER — METHYLPREDNISOLONE (PAK) 4 MG PO TABS: 4.0000 mg | ORAL_TABLET | Freq: Every day | ORAL | Status: DC

## 2011-12-08 MED ORDER — HYDROCODONE-ACETAMINOPHEN 5-325 MG PO TABS: 1.0000 | ORAL_TABLET | Freq: Four times a day (QID) | ORAL | Status: DC | PRN

## 2011-12-08 NOTE — Progress Notes (Signed)
Stacey Cooper [id number] May 13, 1935 12/08/2011      Progress Note-Follow Up  Subjective  Chief Complaint  Chief Complaint  Patient presents with  . Back Pain    began Monday night, ? injury, but pt will not tell me what happened.  She will tell MD.  Pt took Tylenol yesterday AM, and ibuprofen last night    HPI  Is a 76 year old Caucasian female who is in today to come here by her husband for evaluation of acne. She has tried Tylenol and Advil at home without relief. Earlier in the week she was doing a lot of heavy cleaning and moving heavy furniture and within 24 hours began to have mid back pain. Pain radiates to her right flank, down to her right posterior hip and down her right leg. She denies numbness, tingling, weakness, incontinence. She denies falls or acute injury. She denies history of similar symptoms. She denies any GI or GU changes such as incontinence. She has difficulty finding a comfortable position has been unable to rest. Denies chest pain, shortness of breath fevers or chills  Past Medical History  Diagnosis Date  . Diabetes mellitus   . UTI'S, HX OF 01/24/2011  . URINARY INCONTINENCE 02/07/2011  . SQUAMOUS CELL CARCINOMA OTHER SPEC SITES SKIN 01/24/2011  . NONSPEC ELEVATION OF LEVELS OF TRANSAMINASE/LDH 02/07/2011  . Morbid obesity 01/24/2011  . MEASLES, HX OF 01/24/2011  . HAIR LOSS 01/24/2011  . GERD 01/24/2011  . Essential hypertension, benign 01/24/2011  . DM 01/24/2011  . CHOLELITHIASIS, HX OF 02/07/2011  . Basal cell carcinoma of skin of lip 01/24/2011  . ABDOMINAL PAIN, RIGHT UPPER QUADRANT, HX OF 01/24/2011  . Peripheral neuropathy 04/20/2011  . SCC (squamous cell carcinoma), face 07/18/2011  . Actinic keratoses 07/18/2011  . Back pain 12/08/2011    Past Surgical History  Procedure Date  . Abdominal hysterectomy     partial, both ovaries left in place  . Total knee replacement, b/l   . Cataract extraction, b/l   . Skin bx, right anterior cw , squamous  cancer, had to redo to margins 12/2010  . Bcc removed below lower lip on left 26 yrs ago  . Left knee arthroscopy and cleaning prior to tkr     Family History  Problem Relation Age of Onset  . Arthritis Mother     RA  . Myelodysplastic syndrome Mother   . Cancer Father     skin  . Stroke Father   . Stroke Maternal Grandmother   . Kidney failure Maternal Grandfather     History   Social History  . Marital Status: Married    Spouse Name: N/A    Number of Children: N/A  . Years of Education: N/A   Occupational History  . Not on file.   Social History Main Topics  . Smoking status: Never Smoker   . Smokeless tobacco: Never Used  . Alcohol Use: No  . Drug Use: No  . Sexually Active: No   Other Topics Concern  . Not on file   Social History Narrative  . No narrative on file    Current Outpatient Prescriptions on File Prior to Visit  Medication Sig Dispense Refill  . Flaxseed, Linseed, (FLAXSEED OIL) 1000 MG CAPS Take 1 capsule by mouth daily.        . Lancets MISC by Does not apply route. Check blood sugars qam and once daily prn       . lisinopril-hydrochlorothiazide (PRINZIDE,ZESTORETIC) 10-12.5 MG per tablet  Take 1 tablet by mouth daily.  30 tablet  3  . lovastatin (MEVACOR) 10 MG tablet Take 1 tablet (10 mg total) by mouth daily.  30 tablet  3  . SitaGLIPtin-MetFORMIN HCl [phone] MG TB24 Take 1 tablet by mouth daily.  30 tablet  4    Allergies  Allergen Reactions  . Metformin     REACTION: nausea, vomit, diarrhea    Review of Systems  Review of Systems  Constitutional: Negative for fever, chills and malaise/fatigue.  HENT: Negative for hearing loss, nosebleeds and congestion.   Eyes: Negative for discharge.  Respiratory: Negative for cough, sputum production, shortness of breath and wheezing.   Cardiovascular: Negative for chest pain, palpitations and leg swelling.  Gastrointestinal: Negative for heartburn, nausea, vomiting, abdominal pain, diarrhea,  constipation and blood in stool.  Genitourinary: Negative for dysuria, urgency, frequency and hematuria.  Musculoskeletal: Positive for back pain and joint pain. Negative for myalgias and falls.       Mid back towards right and down back of right leg to knee  Skin: Negative for rash.  Neurological: Negative for dizziness, tremors, sensory change, focal weakness, loss of consciousness, weakness and headaches.  Endo/Heme/Allergies: Negative for polydipsia. Does not bruise/bleed easily.  Psychiatric/Behavioral: Negative for depression and suicidal ideas. The patient is not nervous/anxious and does not have insomnia.     Objective  BP 140/82  Pulse 86  Temp(Src) 98.9 F (37.2 C) (Temporal)  Ht 5\' 3"  (1.6 m)  Wt 229 lb (103.874 kg)  BMI 40.57 kg/m2  Physical Exam  Physical Exam  Constitutional: She is oriented to person, place, and time. She appears distressed.  HENT:  Head: Normocephalic and atraumatic.  Eyes: Conjunctivae are normal.  Neck: Neck supple. No thyromegaly present.  Cardiovascular: Normal rate, regular rhythm and normal heart sounds.   No murmur heard. Pulmonary/Chest: Effort normal and breath sounds normal. She has no wheezes.  Abdominal: She exhibits no distension and no mass.  Musculoskeletal: She exhibits tenderness. She exhibits no edema.       Pain with palp and movement over right thoracic paravertebral muscles and down right flank, right posterior hip and down back of right leg  Lymphadenopathy:    She has no cervical adenopathy.  Neurological: She is alert and oriented to person, place, and time.  Skin: Skin is warm and dry. No rash noted. She is not diaphoretic.  Psychiatric: Memory, affect and judgment normal.    Lab Results  Component Value Date   TSH 3.59 10/18/2011   Lab Results  Component Value Date   WBC 10.6* 10/18/2011   HGB 14.5 10/18/2011   HCT 43.4 10/18/2011   MCV 93.5 10/18/2011   PLT 218 10/18/2011   Lab Results  Component Value  Date   CREATININE 0.9 10/18/2011   BUN 14 10/18/2011   NA 140 10/18/2011   K 4.8 10/18/2011   CL 101 10/18/2011   CO2 29 10/18/2011   Lab Results  Component Value Date   ALT 29 10/18/2011   AST 48* 10/18/2011   ALKPHOS 114 10/18/2011   BILITOT 0.8 10/18/2011   Lab Results  Component Value Date   CHOL 156 10/18/2011   Lab Results  Component Value Date   HDL 42.60 10/18/2011   Lab Results  Component Value Date   LDLCALC 91 10/18/2011   Lab Results  Component Value Date   TRIG 114.0 10/18/2011   Lab Results  Component Value Date   CHOLHDL 4 10/18/2011  Assessment & Plan  Back pain She was doing heavy lifting and cleaning her house when she had sudden onset of midback pain radiating to the left down her left posterior hip and down her left leg. She says the pain is intolerable at this time. She has trouble finding a comfortable position and is having trouble resting. She denies history of similar pain in the past. Denies incontinence weakness or falls. She is given a Medrol Dosepak and some cyclobenzaprine to use twice a day. She will hold on Advil until after the Medrol Dosepak is done an intake to 3 times a day for several days. She is also given some Norco to use only for severe pain. She is to apply moist heat and avoid strenuous activity and reported symptoms do not improve.  ESSENTIAL HYPERTENSION, BENIGN Mild elevation with acute illness, no change to medications at this time  DM Will need to minimize carbohydrate intake while on Steroids, monitor sugars

## 2011-12-08 NOTE — Assessment & Plan Note (Signed)
Mild elevation with acute illness, no change to medications at this time

## 2011-12-08 NOTE — Assessment & Plan Note (Signed)
She was doing heavy lifting and cleaning her house when she had sudden onset of midback pain radiating to the left down her left posterior hip and down her left leg. She says the pain is intolerable at this time. She has trouble finding a comfortable position and is having trouble resting. She denies history of similar pain in the past. Denies incontinence weakness or falls. She is given a Medrol Dosepak and some cyclobenzaprine to use twice a day. She will hold on Advil until after the Medrol Dosepak is done an intake to 3 times a day for several days. She is also given some Norco to use only for severe pain. She is to apply moist heat and avoid strenuous activity and reported symptoms do not improve.

## 2011-12-08 NOTE — Patient Instructions (Signed)
Back Pain, Adult Low back pain is very common. About 1 in 5 people have back pain.The cause of low back pain is rarely dangerous. The pain often gets better over time.About half of people with a sudden onset of back pain feel better in just 2 weeks. About 8 in 10 people feel better by 6 weeks.  CAUSES Some common causes of back pain include:  Strain of the muscles or ligaments supporting the spine.   Wear and tear (degeneration) of the spinal discs.   Arthritis.   Direct injury to the back.  DIAGNOSIS Most of the time, the direct cause of low back pain is not known.However, back pain can be treated effectively even when the exact cause of the pain is unknown.Answering your caregiver's questions about your overall health and symptoms is one of the most accurate ways to make sure the cause of your pain is not dangerous. If your caregiver needs more information, he or she may order lab work or imaging tests (X-rays or MRIs).However, even if imaging tests show changes in your back, this usually does not require surgery. HOME CARE INSTRUCTIONS For many people, back pain returns.Since low back pain is rarely dangerous, it is often a condition that people can learn to manageon their own.   Remain active. It is stressful on the back to sit or stand in one place. Do not sit, drive, or stand in one place for more than 30 minutes at a time. Take short walks on level surfaces as soon as pain allows.Try to increase the length of time you walk each day.   Do not stay in bed.Resting more than 1 or 2 days can delay your recovery.   Do not avoid exercise or work.Your body is made to move.It is not dangerous to be active, even though your back may hurt.Your back will likely heal faster if you return to being active before your pain is gone.   Pay attention to your body when you bend and lift. Many people have less discomfortwhen lifting if they bend their knees, keep the load close to their  bodies,and avoid twisting. Often, the most comfortable positions are those that put less stress on your recovering back.   Find a comfortable position to sleep. Use a firm mattress and lie on your side with your knees slightly bent. If you lie on your back, put a pillow under your knees.   Only take over-the-counter or prescription medicines as directed by your caregiver. Over-the-counter medicines to reduce pain and inflammation are often the most helpful.Your caregiver may prescribe muscle relaxant drugs.These medicines help dull your pain so you can more quickly return to your normal activities and healthy exercise.   Put ice on the injured area.   Put ice in a plastic bag.   Place a towel between your skin and the bag.   Leave the ice on for 15 to 20 minutes, 3 to 4 times a day for the first 2 to 3 days. After that, ice and heat may be alternated to reduce pain and spasms.   Ask your caregiver about trying back exercises and gentle massage. This may be of some benefit.   Avoid feeling anxious or stressed.Stress increases muscle tension and can worsen back pain.It is important to recognize when you are anxious or stressed and learn ways to manage it.Exercise is a great option.  SEEK MEDICAL CARE IF:  You have pain that is not relieved with rest or medicine.   You have   pain that does not improve in 1 week.   You have new symptoms.   You are generally not feeling well.  SEEK IMMEDIATE MEDICAL CARE IF:   You have pain that radiates from your back into your legs.   You develop new bowel or bladder control problems.   You have unusual weakness or numbness in your arms or legs.   You develop nausea or vomiting.   You develop abdominal pain.   You feel faint.  Document Released: 12/12/2005 Document Revised: 08/24/2011 Document Reviewed: 05/02/2011 [employer] Patient Information 2012 [employer]. 

## 2011-12-08 NOTE — Assessment & Plan Note (Signed)
Will need to minimize carbohydrate intake while on Steroids, monitor sugars

## 2012-02-03 ENCOUNTER — Other Ambulatory Visit: Payer: Self-pay | Admitting: Family Medicine

## 2012-02-13 ENCOUNTER — Other Ambulatory Visit: Payer: Self-pay | Admitting: Family Medicine

## 2012-02-13 NOTE — Telephone Encounter (Signed)
Pt needed DM follow up in 07/2011.  30 day supply sent.

## 2012-02-14 ENCOUNTER — Other Ambulatory Visit: Payer: Self-pay | Admitting: *Deleted

## 2012-02-14 DIAGNOSIS — E785 Hyperlipidemia, unspecified: Secondary | ICD-10-CM

## 2012-02-14 MED ORDER — LOVASTATIN 10 MG PO TABS: 10.0000 mg | ORAL_TABLET | Freq: Every day | ORAL | Status: DC

## 2012-02-14 NOTE — Telephone Encounter (Signed)
Pt past due for follow up. 30 day supply sent with note pt needs appt.

## 2012-03-19 ENCOUNTER — Other Ambulatory Visit: Payer: Self-pay | Admitting: Family Medicine

## 2012-03-19 NOTE — Telephone Encounter (Signed)
eScribe request for refill on lisinopril, janumet, lovastatin Last seen on 12/08/11 Follow up requirements not noted in chart. RX sent for 30 day supply.

## 2012-03-22 ENCOUNTER — Encounter: Payer: Self-pay | Admitting: Family Medicine

## 2012-03-28 ENCOUNTER — Encounter: Payer: Self-pay | Admitting: Family Medicine

## 2012-04-24 ENCOUNTER — Encounter: Payer: Self-pay | Admitting: Family Medicine

## 2012-04-24 ENCOUNTER — Ambulatory Visit (INDEPENDENT_AMBULATORY_CARE_PROVIDER_SITE_OTHER): Payer: Medicare Other | Admitting: Family Medicine

## 2012-04-24 VITALS — BP 123/75 | HR 80 | Temp 97.8°F | Ht 63.0 in | Wt 230.8 lb

## 2012-04-24 DIAGNOSIS — E782 Mixed hyperlipidemia: Secondary | ICD-10-CM | POA: Insufficient documentation

## 2012-04-24 DIAGNOSIS — IMO0001 Reserved for inherently not codable concepts without codable children: Secondary | ICD-10-CM

## 2012-04-24 DIAGNOSIS — R7401 Elevation of levels of liver transaminase levels: Secondary | ICD-10-CM

## 2012-04-24 DIAGNOSIS — I10 Essential (primary) hypertension: Secondary | ICD-10-CM

## 2012-04-24 DIAGNOSIS — E785 Hyperlipidemia, unspecified: Secondary | ICD-10-CM

## 2012-04-24 DIAGNOSIS — G609 Hereditary and idiopathic neuropathy, unspecified: Secondary | ICD-10-CM

## 2012-04-24 DIAGNOSIS — G629 Polyneuropathy, unspecified: Secondary | ICD-10-CM

## 2012-04-24 DIAGNOSIS — E119 Type 2 diabetes mellitus without complications: Secondary | ICD-10-CM

## 2012-04-24 HISTORY — DX: Mixed hyperlipidemia: E78.2

## 2012-04-24 LAB — CBC
MCHC: 33.1 g/dL (ref 30.0–36.0)
Platelets: 176 10*3/uL (ref 150.0–400.0)
RDW: 13.1 % (ref 11.5–14.6)
WBC: 9.4 10*3/uL (ref 4.5–10.5)

## 2012-04-24 MED ORDER — SITAGLIPTIN PHOS-METFORMIN HCL 50-500 MG PO TABS: 1.0000 | ORAL_TABLET | Freq: Two times a day (BID) | ORAL | Status: DC

## 2012-04-24 NOTE — Assessment & Plan Note (Addendum)
Still struggling with tingling in her feet but finds it tolerable no meds at this time

## 2012-04-24 NOTE — Progress Notes (Signed)
Patient ID: Stacey Cooper, female   DOB: 16-Mar-1935, 77 y.o.   MRN: [id number] ANJELIQUE MAKAR [id number] [birth year] 04/24/2012      Progress Note-Follow Up  Subjective  Chief Complaint  Chief Complaint  Patient presents with  . Follow-up    on medication    HPI  Patient is a 77 Caucasian female who is in today for followup on her diabetes. She reports recently her blood sugars in the a.m. been trending up. They're ranging between 97 and 143 with most numbers in the 120s to 130s. She notes if she has some numbers and ambulate her in the afternoon. She recently had a number of 83 and shaky. She continues to trouble with fatigue and has had a lot of stress with the recent passing of one of her hands. No chest pain, palpitations, shortness of breath, GI or GU complaints noted at today's visit  Past Medical History  Diagnosis Date  . Diabetes mellitus   . UTI'S, HX OF 01/24/2011  . URINARY INCONTINENCE 02/07/2011  . SQUAMOUS CELL CARCINOMA OTHER SPEC SITES SKIN 01/24/2011  . NONSPEC ELEVATION OF LEVELS OF TRANSAMINASE/LDH 02/07/2011  . Morbid obesity 01/24/2011  . MEASLES, HX OF 01/24/2011  . HAIR LOSS 01/24/2011  . GERD 01/24/2011  . Essential hypertension, benign 01/24/2011  . DM 01/24/2011  . CHOLELITHIASIS, HX OF 02/07/2011  . Basal cell carcinoma of skin of lip 01/24/2011  . ABDOMINAL PAIN, RIGHT UPPER QUADRANT, HX OF 01/24/2011  . Peripheral neuropathy 04/20/2011  . SCC (squamous cell carcinoma), face 07/18/2011  . Actinic keratoses 07/18/2011  . Back pain 12/08/2011  . Hyperlipidemia 04/24/2012    Past Surgical History  Procedure Date  . Abdominal hysterectomy     partial, both ovaries left in place  . Total knee replacement, b/l   . Cataract extraction, b/l   . Skin bx, right anterior cw , squamous cancer, had to redo to margins 12/2010  . Bcc removed below lower lip on left 26 yrs ago  . Left knee arthroscopy and cleaning prior to tkr     Family History  Problem Relation Age  of Onset  . Arthritis Mother     RA  . Myelodysplastic syndrome Mother   . Cancer Father     skin  . Stroke Father   . Stroke Maternal Grandmother   . Kidney failure Maternal Grandfather     History   Social History  . Marital Status: Married    Spouse Name: N/A    Number of Children: N/A  . Years of Education: N/A   Occupational History  . Not on file.   Social History Main Topics  . Smoking status: Never Smoker   . Smokeless tobacco: Never Used  . Alcohol Use: No  . Drug Use: No  . Sexually Active: No   Other Topics Concern  . Not on file   Social History Narrative  . No narrative on file    Current Outpatient Prescriptions on File Prior to Visit  Medication Sig Dispense Refill  . Flaxseed, Linseed, (FLAXSEED OIL) 1000 MG CAPS Take 2 capsules by mouth daily.       Marland Kitchen ibuprofen (ADVIL,MOTRIN) 200 MG tablet Take 800 mg by mouth once.        . Lancets MISC by Does not apply route. Check blood sugars qam and once daily prn       . lisinopril-hydrochlorothiazide (PRINZIDE,ZESTORETIC) 10-12.5 MG per tablet TAKE 1 TABLET BY MOUTH DAILY.  30 tablet  0  . lovastatin (MEVACOR) 10 MG tablet TAKE 1 TABLET (10 MG TOTAL) BY MOUTH DAILY. MUST HAVE OFFICE VISIT FOR MORE REFILLS.  30 tablet  0  . ranitidine (ZANTAC) 300 MG tablet TAKE 1 TABLET BY MOUTH DAILY  30 tablet  2    Allergies  Allergen Reactions  . Metformin     REACTION: nausea, vomit, diarrhea    Review of Systems  Review of Systems  Constitutional: Positive for malaise/fatigue. Negative for fever.  HENT: Negative for congestion.   Eyes: Negative for discharge.  Respiratory: Negative for shortness of breath.   Cardiovascular: Negative for chest pain, palpitations and leg swelling.  Gastrointestinal: Negative for nausea, abdominal pain and diarrhea.  Genitourinary: Negative for dysuria.  Musculoskeletal: Negative for falls.  Skin: Negative for rash.  Neurological: Positive for tingling and sensory change.  Negative for loss of consciousness and headaches.  Endo/Heme/Allergies: Negative for polydipsia.  Psychiatric/Behavioral: Negative for depression and suicidal ideas. The patient is not nervous/anxious and does not have insomnia.     Objective  BP 123/75  Pulse 80  Temp(Src) 97.8 F (36.6 C) (Temporal)  Ht 5\' 3"  (1.6 m)  Wt 230 lb 12.8 oz (104.69 kg)  BMI 40.88 kg/m2  SpO2 97%  Physical Exam  Physical Exam  Constitutional: She is oriented to person, place, and time and well-developed, well-nourished, and in no distress. No distress.       obese  HENT:  Head: Normocephalic and atraumatic.  Eyes: Conjunctivae are normal.  Neck: Neck supple. No thyromegaly present.  Cardiovascular: Normal rate, regular rhythm and normal heart sounds.   Pulmonary/Chest: Effort normal and breath sounds normal. She has no wheezes.  Abdominal: She exhibits no distension and no mass.  Musculoskeletal: She exhibits no edema.  Lymphadenopathy:    She has no cervical adenopathy.  Neurological: She is alert and oriented to person, place, and time.  Skin: Skin is warm and dry. No rash noted. She is not diaphoretic.  Psychiatric: Memory, affect and judgment normal.    Lab Results  Component Value Date   TSH 3.59 10/18/2011   Lab Results  Component Value Date   WBC 10.6* 10/18/2011   HGB 14.5 10/18/2011   HCT 43.4 10/18/2011   MCV 93.5 10/18/2011   PLT 218 10/18/2011   Lab Results  Component Value Date   CREATININE 0.9 10/18/2011   BUN 14 10/18/2011   NA 140 10/18/2011   K 4.8 10/18/2011   CL 101 10/18/2011   CO2 29 10/18/2011   Lab Results  Component Value Date   ALT 29 10/18/2011   AST 48* 10/18/2011   ALKPHOS 114 10/18/2011   BILITOT 0.8 10/18/2011   Lab Results  Component Value Date   CHOL 156 10/18/2011   Lab Results  Component Value Date   HDL 42.60 10/18/2011   Lab Results  Component Value Date   LDLCALC 91 10/18/2011   Lab Results  Component Value Date   TRIG  114.0 10/18/2011   Lab Results  Component Value Date   CHOLHDL 4 10/18/2011     Assessment & Plan  ESSENTIAL HYPERTENSION, BENIGN Well controlled no change in meds today  DM Patient reports her am sugars have been trending up, lowest was 97 and highest 143, then has some numbers in the 80s and feels poorly. Will change her Janumet to 50/500 1 tab po bid  And check a hgba1c  Peripheral neuropathy Still struggling with tingling in her feet but finds it tolerable no  meds at this time  NONSPEC ELEVATION OF LEVELS OF TRANSAMINASE/LDH Mild, will repeat lft's   Hyperlipidemia Avoid trans fats, Lovastatin working well, no change in meds for now

## 2012-04-24 NOTE — Assessment & Plan Note (Signed)
Avoid trans fats, Lovastatin working well, no change in meds for now

## 2012-04-24 NOTE — Assessment & Plan Note (Signed)
Mild, will repeat lft's

## 2012-04-24 NOTE — Assessment & Plan Note (Signed)
Patient reports her am sugars have been trending up, lowest was 97 and highest 143, then has some numbers in the 80s and feels poorly. Will change her Janumet to 50/500 1 tab po bid  And check a hgba1c

## 2012-04-24 NOTE — Patient Instructions (Signed)

## 2012-04-24 NOTE — Assessment & Plan Note (Signed)
Well controlled no change in meds today 

## 2012-04-25 LAB — RENAL FUNCTION PANEL
CO2: 25 mEq/L (ref 19–32)
Chloride: 106 mEq/L (ref 96–112)
GFR: 73.34 mL/min (ref >60.00)
Phosphorus: 3.7 mg/dL (ref 2.3–4.6)
Potassium: 4.1 mEq/L (ref 3.5–5.1)

## 2012-04-25 LAB — LIPID PANEL
Cholesterol: 150 mg/dL (ref 0–200)
HDL: 40.7 mg/dL (ref >39.00)
LDL Cholesterol: 79 mg/dL (ref 0–99)
Triglycerides: 151 mg/dL — ABNORMAL HIGH (ref 0.0–149.0)
VLDL: 30.2 mg/dL (ref 0.0–40.0)

## 2012-04-25 LAB — HEPATIC FUNCTION PANEL
AST: 40 U/L — ABNORMAL HIGH (ref 0–37)
Alkaline Phosphatase: 105 U/L (ref 39–117)
Bilirubin, Direct: 0.2 mg/dL (ref 0.0–0.3)

## 2012-04-26 ENCOUNTER — Telehealth: Payer: Self-pay | Admitting: Family Medicine

## 2012-04-26 MED ORDER — LOVASTATIN 10 MG PO TABS: 10.0000 mg | ORAL_TABLET | Freq: Every day | ORAL | Status: DC

## 2012-04-26 MED ORDER — RANITIDINE HCL 300 MG PO TABS: 300.0000 mg | ORAL_TABLET | Freq: Every day | ORAL | Status: DC

## 2012-04-26 MED ORDER — LISINOPRIL-HYDROCHLOROTHIAZIDE 10-12.5 MG PO TABS: 1.0000 | ORAL_TABLET | Freq: Every day | ORAL | Status: DC

## 2012-04-26 NOTE — Telephone Encounter (Signed)
Patient states she needs all of her medication sent to [facility]. I sent RX's

## 2012-05-09 ENCOUNTER — Other Ambulatory Visit: Payer: Self-pay | Admitting: Family Medicine

## 2012-05-09 DIAGNOSIS — E119 Type 2 diabetes mellitus without complications: Secondary | ICD-10-CM

## 2012-05-09 MED ORDER — SITAGLIPTIN PHOS-METFORMIN HCL 50-500 MG PO TABS: 1.0000 | ORAL_TABLET | Freq: Two times a day (BID) | ORAL | Status: AC

## 2012-05-09 NOTE — Telephone Encounter (Signed)
Pt informed that RX was sent

## 2012-05-09 NOTE — Telephone Encounter (Signed)
Patient said that [facility] did not receive a Rx for Janumet. Patient said their phone # is [phone].

## 2012-07-24 ENCOUNTER — Encounter: Payer: Self-pay | Admitting: Family Medicine

## 2012-07-24 ENCOUNTER — Ambulatory Visit (INDEPENDENT_AMBULATORY_CARE_PROVIDER_SITE_OTHER): Payer: Medicare Other | Admitting: Family Medicine

## 2012-07-24 VITALS — BP 123/65 | HR 64 | Temp 97.8°F | Ht 63.0 in | Wt 225.0 lb

## 2012-07-24 DIAGNOSIS — F419 Anxiety disorder, unspecified: Secondary | ICD-10-CM

## 2012-07-24 DIAGNOSIS — E785 Hyperlipidemia, unspecified: Secondary | ICD-10-CM

## 2012-07-24 DIAGNOSIS — F411 Generalized anxiety disorder: Secondary | ICD-10-CM

## 2012-07-24 DIAGNOSIS — I10 Essential (primary) hypertension: Secondary | ICD-10-CM

## 2012-07-24 DIAGNOSIS — E119 Type 2 diabetes mellitus without complications: Secondary | ICD-10-CM

## 2012-07-24 DIAGNOSIS — G4700 Insomnia, unspecified: Secondary | ICD-10-CM

## 2012-07-24 HISTORY — DX: Insomnia, unspecified: G47.00

## 2012-07-24 LAB — LIPID PANEL
Cholesterol: 144 mg/dL (ref 0–200)
LDL Cholesterol: 75 mg/dL (ref 0–99)

## 2012-07-24 LAB — RENAL FUNCTION PANEL
Albumin: 3.9 g/dL (ref 3.5–5.2)
BUN: 14 mg/dL (ref 6–23)
Calcium: 9.7 mg/dL (ref 8.4–10.5)
Creatinine, Ser: 0.9 mg/dL (ref 0.4–1.2)
Glucose, Bld: 117 mg/dL — ABNORMAL HIGH (ref 70–99)
Potassium: 4.2 mEq/L (ref 3.5–5.1)

## 2012-07-24 LAB — CBC
HCT: 41.9 % (ref 36.0–46.0)
Hemoglobin: 14 g/dL (ref 12.0–15.0)
MCV: 93.8 fl (ref 78.0–100.0)
Platelets: 175 10*3/uL (ref 150.0–400.0)
RBC: 4.47 Mil/uL (ref 3.87–5.11)

## 2012-07-24 LAB — HEPATIC FUNCTION PANEL
ALT: 22 U/L (ref 0–35)
Albumin: 3.9 g/dL (ref 3.5–5.2)
Bilirubin, Direct: 0 mg/dL (ref 0.0–0.3)
Total Protein: 7.3 g/dL (ref 6.0–8.3)

## 2012-07-24 MED ORDER — ALPRAZOLAM 0.25 MG PO TABS: 0.2500 mg | ORAL_TABLET | Freq: Two times a day (BID) | ORAL | Status: AC | PRN

## 2012-07-24 NOTE — Assessment & Plan Note (Addendum)
Check lipids, cont Lovastatin

## 2012-07-24 NOTE — Assessment & Plan Note (Signed)
Patient reports sugars well-controlled recently. Concerned about the cost of January. Is given samples of Janumet 50/500 to take 1 tablet twice a day. She runs out of that she's also given samples of Janumet XR 100/thousand take 1 daily. When she runs low on medication she can call us. Would not like for her to have to change medications Will she's handling the stress of her husband's diagnosis consider further changes in future. Avoid simple carbs. Check hgba1c today

## 2012-07-24 NOTE — Assessment & Plan Note (Signed)
5 pound weight loss but unfortunately due to stress of husband's metastatic cancer diagnosis in past 2 weeks

## 2012-07-24 NOTE — Progress Notes (Signed)
Patient ID: Stacey Cooper, female   DOB: 1935/02/23, 77 y.o.   MRN: [id number] AARIAN CLEAVER [id number] [birth year] 07/24/2012      Progress Note-Follow Up  Subjective  Chief Complaint  Chief Complaint  Patient presents with  . Follow-up    3 month w/ labs    HPI  Patient is a 77 year old Caucasian female who is in today for followup. In the last 2 weeks she's been under great stress. Her husband said 2005 has been diagnosed with metastatic cancer. They're still unsure of the primary. There are no surgical options although he is undergoing a colonoscopy. He has lesions throughout the abdomen as well as a chest. She is sad and anxious. She notes she's having trouble sleeping. She went through this in the past with her husband of 22 years as well and ended up on hospice and eventually he died of metastatic cancer as well. She has not labile in the office and declines daily medications. Otherwise physically she's doing all right. She reports her sugars are ranging from 90-120. No polyuria or polydipsia. Denies any recent illness, fevers, chest pain, palpitations, shortness of breath, GI or GU complaints at this time  Past Medical History  Diagnosis Date  . Diabetes mellitus   . UTI'S, HX OF 01/24/2011  . URINARY INCONTINENCE 02/07/2011  . SQUAMOUS CELL CARCINOMA OTHER SPEC SITES SKIN 01/24/2011  . NONSPEC ELEVATION OF LEVELS OF TRANSAMINASE/LDH 02/07/2011  . Morbid obesity 01/24/2011  . MEASLES, HX OF 01/24/2011  . HAIR LOSS 01/24/2011  . GERD 01/24/2011  . Essential hypertension, benign 01/24/2011  . DM 01/24/2011  . CHOLELITHIASIS, HX OF 02/07/2011  . Basal cell carcinoma of skin of lip 01/24/2011  . ABDOMINAL PAIN, RIGHT UPPER QUADRANT, HX OF 01/24/2011  . Peripheral neuropathy 04/20/2011  . SCC (squamous cell carcinoma), face 07/18/2011  . Actinic keratoses 07/18/2011  . Back pain 12/08/2011  . Hyperlipidemia 04/24/2012  . Insomnia 07/24/2012    Past Surgical History  Procedure Date  .  Abdominal hysterectomy     partial, both ovaries left in place  . Total knee replacement, b/l   . Cataract extraction, b/l   . Skin bx, right anterior cw , squamous cancer, had to redo to margins 12/2010  . Bcc removed below lower lip on left 26 yrs ago  . Left knee arthroscopy and cleaning prior to tkr     Family History  Problem Relation Age of Onset  . Arthritis Mother     RA  . Myelodysplastic syndrome Mother   . Cancer Father     skin  . Stroke Father   . Stroke Maternal Grandmother   . Kidney failure Maternal Grandfather     History   Social History  . Marital Status: Married    Spouse Name: N/A    Number of Children: N/A  . Years of Education: N/A   Occupational History  . Not on file.   Social History Main Topics  . Smoking status: Never Smoker   . Smokeless tobacco: Never Used  . Alcohol Use: No  . Drug Use: No  . Sexually Active: No   Other Topics Concern  . Not on file   Social History Narrative  . No narrative on file    Current Outpatient Prescriptions on File Prior to Visit  Medication Sig Dispense Refill  . Flaxseed, Linseed, (FLAXSEED OIL) 1000 MG CAPS Take 2 capsules by mouth daily.       Marland Kitchen ibuprofen (ADVIL,MOTRIN) 200 MG  tablet Take 800 mg by mouth once.        . Lancets MISC by Does not apply route. Check blood sugars qam and once daily prn       . lisinopril-hydrochlorothiazide (PRINZIDE,ZESTORETIC) 10-12.5 MG per tablet Take 1 tablet by mouth daily.  90 tablet  3  . lovastatin (MEVACOR) 10 MG tablet Take 1 tablet (10 mg total) by mouth at bedtime.  90 tablet  3  . ranitidine (ZANTAC) 300 MG tablet Take 1 tablet (300 mg total) by mouth daily.  90 tablet  3  . sitaGLIPtan-metformin (JANUMET) 50-500 MG per tablet Take 1 tablet by mouth 2 (two) times daily with a meal.  180 tablet  2    Allergies  Allergen Reactions  . Metformin     REACTION: nausea, vomit, diarrhea    Review of Systems  Review of Systems  Constitutional: Positive for  malaise/fatigue. Negative for fever.  HENT: Negative for congestion.   Eyes: Negative for discharge.  Respiratory: Negative for shortness of breath.   Cardiovascular: Negative for chest pain, palpitations and leg swelling.  Gastrointestinal: Negative for nausea, abdominal pain and diarrhea.  Genitourinary: Negative for dysuria.  Musculoskeletal: Positive for myalgias. Negative for falls.  Skin: Negative for rash.  Neurological: Negative for loss of consciousness and headaches.  Endo/Heme/Allergies: Negative for polydipsia.  Psychiatric/Behavioral: Negative for depression and suicidal ideas. The patient is nervous/anxious and has insomnia.     Objective  BP 123/65  Pulse 64  Temp 97.8 F (36.6 C) (Temporal)  Ht 5\' 3"  (1.6 m)  Wt 225 lb (102.059 kg)  BMI 39.86 kg/m2  SpO2 97%  Physical Exam  Physical Exam  Constitutional: She is oriented to person, place, and time and well-developed, well-nourished, and in no distress. No distress.       obese  HENT:  Head: Normocephalic and atraumatic.  Eyes: Conjunctivae are normal.  Neck: Neck supple. No thyromegaly present.  Cardiovascular: Normal rate, regular rhythm and normal heart sounds.   No murmur heard. Pulmonary/Chest: Effort normal and breath sounds normal. She has no wheezes.  Abdominal: She exhibits no distension and no mass.  Musculoskeletal: She exhibits no edema.  Lymphadenopathy:    She has no cervical adenopathy.  Neurological: She is alert and oriented to person, place, and time.  Skin: Skin is warm and dry. No rash noted. She is not diaphoretic.  Psychiatric: Memory, affect and judgment normal.    Lab Results  Component Value Date   TSH 2.74 04/24/2012   Lab Results  Component Value Date   WBC 9.4 04/24/2012   HGB 14.6 04/24/2012   HCT 44.0 04/24/2012   MCV 94.6 04/24/2012   PLT 176.0 04/24/2012   Lab Results  Component Value Date   CREATININE 0.8 04/24/2012   BUN 14 04/24/2012   NA 143 04/24/2012   K 4.1  04/24/2012   CL 106 04/24/2012   CO2 25 04/24/2012   Lab Results  Component Value Date   ALT 25 04/24/2012   AST 40* 04/24/2012   ALKPHOS 105 04/24/2012   BILITOT 0.6 04/24/2012   Lab Results  Component Value Date   CHOL 150 04/24/2012   Lab Results  Component Value Date   HDL 40.70 04/24/2012   Lab Results  Component Value Date   LDLCALC 79 04/24/2012   Lab Results  Component Value Date   TRIG 151.0* 04/24/2012   Lab Results  Component Value Date   CHOLHDL 4 04/24/2012     Assessment &  Plan  ESSENTIAL HYPERTENSION, BENIGN Adequately controlled no changes to meds  MORBID OBESITY 5 pound weight loss but unfortunately due to stress of husband's metastatic cancer diagnosis in past 2 weeks  DM Patient reports sugars well-controlled recently. Concerned about the cost of January. Is given samples of Janumet 50/500 to take 1 tablet twice a day. She runs out of that she's also given samples of Janumet XR 100/thousand take 1 daily. When she runs low on medication she can call us. Would not like for her to have to change medications Will she's handling the stress of her husband's diagnosis consider further changes in future. Avoid simple carbs. Check hgba1c today  Hyperlipidemia Check lipids, cont Lovastatin  Insomnia Struggling with difficulty sleeping and anxiety secondary to her husbands recent diagnosis of metastatic cancer. She has been married to her husband since 2005 and lost her previous husband of 22 years to cancer number of years ago as well. She declines any chronic medications but for her anxiety and insomnia she agrees to take a small prescription for alprazolam to use as needed. She is unclear she will use it she will call if she needs any further assistance.

## 2012-07-24 NOTE — Patient Instructions (Addendum)

## 2012-07-24 NOTE — Assessment & Plan Note (Signed)
Adequately controlled no changes to meds

## 2012-07-24 NOTE — Assessment & Plan Note (Signed)
Struggling with difficulty sleeping and anxiety secondary to her husbands recent diagnosis of metastatic cancer. She has been married to her husband since 2005 and lost her previous husband of 22 years to cancer number of years ago as well. She declines any chronic medications but for her anxiety and insomnia she agrees to take a small prescription for alprazolam to use as needed. She is unclear she will use it she will call if she needs any further assistance.

## 2012-10-18 ENCOUNTER — Other Ambulatory Visit: Payer: Medicare Other

## 2012-10-25 ENCOUNTER — Ambulatory Visit: Payer: Medicare Other | Admitting: Family Medicine

## 2012-11-29 ENCOUNTER — Ambulatory Visit (INDEPENDENT_AMBULATORY_CARE_PROVIDER_SITE_OTHER): Payer: Medicare Other

## 2012-11-29 DIAGNOSIS — Z23 Encounter for immunization: Secondary | ICD-10-CM

## 2012-12-20 ENCOUNTER — Encounter: Payer: Self-pay | Admitting: Family Medicine

## 2012-12-20 ENCOUNTER — Telehealth: Payer: Self-pay

## 2012-12-20 ENCOUNTER — Ambulatory Visit (INDEPENDENT_AMBULATORY_CARE_PROVIDER_SITE_OTHER): Payer: Medicare Other | Admitting: Family Medicine

## 2012-12-20 VITALS — BP 128/79 | HR 74 | Temp 98.0°F | Ht 63.0 in | Wt 234.8 lb

## 2012-12-20 DIAGNOSIS — I10 Essential (primary) hypertension: Secondary | ICD-10-CM

## 2012-12-20 DIAGNOSIS — J40 Bronchitis, not specified as acute or chronic: Secondary | ICD-10-CM

## 2012-12-20 MED ORDER — DOXYCYCLINE HYCLATE 100 MG PO TABS: 100.0000 mg | ORAL_TABLET | Freq: Two times a day (BID) | ORAL | Status: DC

## 2012-12-20 MED ORDER — METHYLPREDNISOLONE 4 MG PO KIT: PACK | ORAL | Status: DC

## 2012-12-20 MED ORDER — HYDROCOD POLST-CHLORPHEN POLST 10-8 MG/5ML PO LQCR: 5.0000 mL | Freq: Every evening | ORAL | Status: DC | PRN

## 2012-12-20 NOTE — Progress Notes (Signed)
Patient ID: Stacey Cooper, female   DOB: 01/31/1935, 77 y.o.   MRN: [id number] ADDLEY BALLINGER [id number] [birth year] 12/20/2012      Progress Note-Follow Up  Subjective  Chief Complaint  Chief Complaint  Patient presents with  . Cough    X 2 days  . Sore Throat    X 2 days    HPI  Patient is a 77 year old Caucasian female who is in today with a three-day history of worsening congestion and cough. Symptoms are with a sore throat and some chest congestion and 3 days ago. Her cough is essentially dry but is keeping her up at night. Is worse when she lies down in her throat is painful enough to make it difficult to eat. He said some low-grade fevers, chills, malaise and myalgias. She has some low-grade headache and head congestion as well. No ear pain no chest pain, palpitations. No GI or GU complaints. She has tried plain Mucinex with no significant benefit thus far.  Past Medical History  Diagnosis Date  . Diabetes mellitus   . UTI'S, HX OF 01/24/2011  . URINARY INCONTINENCE 02/07/2011  . SQUAMOUS CELL CARCINOMA OTHER SPEC SITES SKIN 01/24/2011  . NONSPEC ELEVATION OF LEVELS OF TRANSAMINASE/LDH 02/07/2011  . Morbid obesity 01/24/2011  . MEASLES, HX OF 01/24/2011  . HAIR LOSS 01/24/2011  . GERD 01/24/2011  . Essential hypertension, benign 01/24/2011  . DM 01/24/2011  . CHOLELITHIASIS, HX OF 02/07/2011  . Basal cell carcinoma of skin of lip 01/24/2011  . ABDOMINAL PAIN, RIGHT UPPER QUADRANT, HX OF 01/24/2011  . Peripheral neuropathy 04/20/2011  . SCC (squamous cell carcinoma), face 07/18/2011  . Actinic keratoses 07/18/2011  . Back pain 12/08/2011  . Hyperlipidemia 04/24/2012  . Insomnia 07/24/2012  . Bronchitis 12/20/2012    Past Surgical History  Procedure Date  . Abdominal hysterectomy     partial, both ovaries left in place  . Total knee replacement, b/l   . Cataract extraction, b/l   . Skin bx, right anterior cw , squamous cancer, had to redo to margins 12/2010  . Bcc removed below  lower lip on left 26 yrs ago  . Left knee arthroscopy and cleaning prior to tkr     Family History  Problem Relation Age of Onset  . Arthritis Mother     RA  . Myelodysplastic syndrome Mother   . Cancer Father     skin  . Stroke Father   . Stroke Maternal Grandmother   . Kidney failure Maternal Grandfather     History   Social History  . Marital Status: Married    Spouse Name: N/A    Number of Children: N/A  . Years of Education: N/A   Occupational History  . Not on file.   Social History Main Topics  . Smoking status: Never Smoker   . Smokeless tobacco: Never Used  . Alcohol Use: No  . Drug Use: No  . Sexually Active: No   Other Topics Concern  . Not on file   Social History Narrative  . No narrative on file    Current Outpatient Prescriptions on File Prior to Visit  Medication Sig Dispense Refill  . cyclobenzaprine (FLEXERIL) 10 MG tablet       . Flaxseed, Linseed, (FLAXSEED OIL) 1000 MG CAPS Take 2 capsules by mouth daily.       Marland Kitchen ibuprofen (ADVIL,MOTRIN) 200 MG tablet Take 800 mg by mouth once.        . Lancets MISC  by Does not apply route. Check blood sugars qam and once daily prn       . lisinopril-hydrochlorothiazide (PRINZIDE,ZESTORETIC) 10-12.5 MG per tablet Take 1 tablet by mouth daily.  90 tablet  3  . lovastatin (MEVACOR) 10 MG tablet Take 1 tablet (10 mg total) by mouth at bedtime.  90 tablet  3  . ranitidine (ZANTAC) 300 MG tablet Take 1 tablet (300 mg total) by mouth daily.  90 tablet  3  . sitaGLIPtan-metformin (JANUMET) 50-500 MG per tablet Take 1 tablet by mouth 2 (two) times daily with a meal.  180 tablet  2    Allergies  Allergen Reactions  . Metformin     REACTION: nausea, vomit, diarrhea    Review of Systems  Review of Systems  Constitutional: Positive for fever, chills and malaise/fatigue.  HENT: Positive for congestion and sore throat.   Eyes: Negative for discharge.  Respiratory: Positive for cough. Negative for shortness of  breath.   Cardiovascular: Negative for chest pain, palpitations and leg swelling.  Gastrointestinal: Negative for nausea, abdominal pain and diarrhea.  Genitourinary: Negative for dysuria.  Musculoskeletal: Negative for falls.  Skin: Negative for rash.  Neurological: Positive for headaches. Negative for loss of consciousness.  Endo/Heme/Allergies: Negative for polydipsia.  Psychiatric/Behavioral: Negative for depression and suicidal ideas. The patient is not nervous/anxious and does not have insomnia.     Objective  BP 128/79  Pulse 74  Temp 98 F (36.7 C) (Temporal)  Ht 5\' 3"  (1.6 m)  Wt 234 lb 12.8 oz (106.505 kg)  BMI 41.59 kg/m2  SpO2 95%  Physical Exam  Physical Exam  Constitutional: She is oriented to person, place, and time and well-developed, well-nourished, and in no distress. No distress.  HENT:  Head: Normocephalic and atraumatic.  Eyes: Conjunctivae normal are normal.  Neck: Neck supple. No thyromegaly present.  Cardiovascular: Normal rate, regular rhythm and normal heart sounds.   No murmur heard. Pulmonary/Chest: Effort normal and breath sounds normal. She has no wheezes.  Abdominal: She exhibits no distension and no mass.  Musculoskeletal: Normal range of motion. She exhibits no edema.  Lymphadenopathy:    She has no cervical adenopathy.  Neurological: She is alert and oriented to person, place, and time.  Skin: Skin is warm and dry. No rash noted. She is not diaphoretic.  Psychiatric: Memory, affect and judgment normal.    Lab Results  Component Value Date   TSH 2.95 07/24/2012   Lab Results  Component Value Date   WBC 8.9 07/24/2012   HGB 14.0 07/24/2012   HCT 41.9 07/24/2012   MCV 93.8 07/24/2012   PLT 175.0 07/24/2012   Lab Results  Component Value Date   CREATININE 0.9 07/24/2012   BUN 14 07/24/2012   NA 139 07/24/2012   K 4.2 07/24/2012   CL 100 07/24/2012   CO2 29 07/24/2012   Lab Results  Component Value Date   ALT 22 07/24/2012   AST 34  07/24/2012   ALKPHOS 99 07/24/2012   BILITOT 0.7 07/24/2012   Lab Results  Component Value Date   CHOL 144 07/24/2012   Lab Results  Component Value Date   HDL 44.50 07/24/2012   Lab Results  Component Value Date   LDLCALC 75 07/24/2012   Lab Results  Component Value Date   TRIG 123.0 07/24/2012   Lab Results  Component Value Date   CHOLHDL 3 07/24/2012     Assessment & Plan  Bronchitis Started on antibiotics, probiotics, steroids, mucinex  and increased fluids.  ESSENTIAL HYPERTENSION, BENIGN Well controlled despite acute illness.

## 2012-12-20 NOTE — Telephone Encounter (Signed)
I sent the Medrol pack to [facility]. I also called and canceled this RX with [facility]

## 2012-12-20 NOTE — Assessment & Plan Note (Signed)
Started on antibiotics, probiotics, steroids, mucinex and increased fluids.

## 2012-12-20 NOTE — Assessment & Plan Note (Signed)
Well controlled despite acute illness 

## 2012-12-20 NOTE — Patient Instructions (Addendum)
Start a probiotic daily and increase fluids and rest  Bronchitis Bronchitis is the body's way of reacting to injury and/or infection (inflammation) of the bronchi. Bronchi are the air tubes that extend from the windpipe into the lungs. If the inflammation becomes severe, it may cause shortness of breath. CAUSES  Inflammation may be caused by:  A virus.  Germs (bacteria).  Dust.  Allergens.  Pollutants and many other irritants. The cells lining the bronchial tree are covered with tiny hairs (cilia). These constantly beat upward, away from the lungs, toward the mouth. This keeps the lungs free of pollutants. When these cells become too irritated and are unable to do their job, mucus begins to develop. This causes the characteristic cough of bronchitis. The cough clears the lungs when the cilia are unable to do their job. Without either of these protective mechanisms, the mucus would settle in the lungs. Then you would develop pneumonia. Smoking is a common cause of bronchitis and can contribute to pneumonia. Stopping this habit is the single most important thing you can do to help yourself. TREATMENT   Your caregiver may prescribe an antibiotic if the cough is caused by bacteria. Also, medicines that open up your airways make it easier to breathe. Your caregiver may also recommend or prescribe an expectorant. It will loosen the mucus to be coughed up. Only take over-the-counter or prescription medicines for pain, discomfort, or fever as directed by your caregiver.  Removing whatever causes the problem (smoking, for example) is critical to preventing the problem from getting worse.  Cough suppressants may be prescribed for relief of cough symptoms.  Inhaled medicines may be prescribed to help with symptoms now and to help prevent problems from returning.  For those with recurrent (chronic) bronchitis, there may be a need for steroid medicines. SEEK IMMEDIATE MEDICAL CARE IF:   During  treatment, you develop more pus-like mucus (purulent sputum).  You have a fever.  Your baby is older than 3 months with a rectal temperature of 102 F (38.9 C) or higher.  Your baby is 106 months old or younger with a rectal temperature of 100.4 F (38 C) or higher.  You become progressively more ill.  You have increased difficulty breathing, wheezing, or shortness of breath. It is necessary to seek immediate medical care if you are elderly or sick from any other disease. MAKE SURE YOU:   Understand these instructions.  Will watch your condition.  Will get help right away if you are not doing well or get worse. Document Released: 12/12/2005 Document Revised: 03/05/2012 Document Reviewed: 10/21/2008 [facility] Patient Information 2013 [place], [place].

## 2012-12-20 NOTE — Telephone Encounter (Signed)
Message copied by Court Joy on Thu Dec 20, 2012  3:34 PM ------      Message from: Carmelia Bake      Created: Thu Dec 20, 2012  2:16 PM       Pharmacy doesn't have the steroid Rx. Please call [facility].

## 2013-04-17 ENCOUNTER — Encounter: Payer: Self-pay | Admitting: Family Medicine

## 2013-06-03 ENCOUNTER — Telehealth: Payer: Self-pay | Admitting: Family Medicine

## 2013-06-03 DIAGNOSIS — I10 Essential (primary) hypertension: Secondary | ICD-10-CM

## 2013-06-03 DIAGNOSIS — E785 Hyperlipidemia, unspecified: Secondary | ICD-10-CM

## 2013-06-03 DIAGNOSIS — E119 Type 2 diabetes mellitus without complications: Secondary | ICD-10-CM

## 2013-06-03 NOTE — Telephone Encounter (Signed)
Patient has cpe on 06/26/13 and will be going to [facility] lab one week prior. Please order labs

## 2013-06-03 NOTE — Telephone Encounter (Signed)
Labs to order, lipid, renal, cbc, tsh, hepatic, hgba1c, microalbumin

## 2013-06-03 NOTE — Telephone Encounter (Signed)
Labs for htn, dm and hyperlipidemia

## 2013-06-03 NOTE — Telephone Encounter (Signed)
Please advise which labs. 

## 2013-06-04 ENCOUNTER — Ambulatory Visit (INDEPENDENT_AMBULATORY_CARE_PROVIDER_SITE_OTHER): Payer: Medicare Other | Admitting: Family Medicine

## 2013-06-04 ENCOUNTER — Encounter: Payer: Self-pay | Admitting: Family Medicine

## 2013-06-04 VITALS — BP 120/72 | HR 90 | Temp 97.5°F | Ht 63.0 in | Wt 231.1 lb

## 2013-06-04 DIAGNOSIS — E119 Type 2 diabetes mellitus without complications: Secondary | ICD-10-CM

## 2013-06-04 DIAGNOSIS — I10 Essential (primary) hypertension: Secondary | ICD-10-CM

## 2013-06-04 DIAGNOSIS — R002 Palpitations: Secondary | ICD-10-CM

## 2013-06-04 DIAGNOSIS — E785 Hyperlipidemia, unspecified: Secondary | ICD-10-CM

## 2013-06-04 DIAGNOSIS — R252 Cramp and spasm: Secondary | ICD-10-CM

## 2013-06-04 LAB — CBC
HCT: 42.9 % (ref 36.0–46.0)
MCV: 90.3 fL (ref 78.0–100.0)
Platelets: 212 10*3/uL (ref 150–400)
RBC: 4.75 MIL/uL (ref 3.87–5.11)
RDW: 13.4 % (ref 11.5–15.5)
WBC: 10 10*3/uL (ref 4.0–10.5)

## 2013-06-04 MED ORDER — LISINOPRIL 5 MG PO TABS: 5.0000 mg | ORAL_TABLET | Freq: Every day | ORAL | Status: DC

## 2013-06-04 MED ORDER — LOVASTATIN 10 MG PO TABS: 10.0000 mg | ORAL_TABLET | Freq: Every day | ORAL | Status: DC

## 2013-06-04 NOTE — Telephone Encounter (Signed)
Orders placed.

## 2013-06-04 NOTE — Patient Instructions (Signed)
Start a probiotic D  Palpitations  A palpitation is the feeling that your heartbeat is irregular or is faster than normal. It may feel like your heart is fluttering or skipping a beat. Palpitations are usually not a serious problem. However, in some cases, you may need further medical evaluation. CAUSES  Palpitations can be caused by:  Smoking.  Caffeine or other stimulants, such as diet pills or energy drinks.  Alcohol.  Stress and anxiety.  Strenuous physical activity.  Fatigue.  Certain medicines.  Heart disease, especially if you have a history of arrhythmias. This includes atrial fibrillation, atrial flutter, or supraventricular tachycardia.  An improperly working pacemaker or defibrillator. DIAGNOSIS  To find the cause of your palpitations, your caregiver will take your history and perform a physical exam. Tests may also be done, including:  Electrocardiography (ECG). This test records the heart's electrical activity.  Cardiac monitoring. This allows your caregiver to monitor your heart rate and rhythm in real time.  Holter monitor. This is a portable device that records your heartbeat and can help diagnose heart arrhythmias. It allows your caregiver to track your heart activity for several days, if needed.  Stress tests by exercise or by giving medicine that makes the heart beat faster. TREATMENT  Treatment of palpitations depends on the cause of your symptoms and can vary greatly. Most cases of palpitations do not require any treatment other than time, relaxation, and monitoring your symptoms. Other causes, such as atrial fibrillation, atrial flutter, or supraventricular tachycardia, usually require further treatment. HOME CARE INSTRUCTIONS   Avoid:  Caffeinated coffee, tea, soft drinks, diet pills, and energy drinks.  Chocolate.  Alcohol.  Stop smoking if you smoke.  Reduce your stress and anxiety. Things that can help you relax include:  A method that  measures bodily functions so you can learn to control them (biofeedback).  Yoga.  Meditation.  Physical activity such as swimming, jogging, or walking.  Get plenty of rest and sleep. SEEK MEDICAL CARE IF:   You continue to have a fast or irregular heartbeat beyond 24 hours.  Your palpitations occur more often. SEEK IMMEDIATE MEDICAL CARE IF:  You develop chest pain or shortness of breath.  You have a severe headache.  You feel dizzy, or you faint. MAKE SURE YOU:  Understand these instructions.  Will watch your condition.  Will get help right away if you are not doing well or get worse. Document Released: 12/09/2000 Document Revised: 06/12/2012 Document Reviewed: 02/10/2012 [employer] Patient Information 2014 [place], [place]. [employer]ive [employer] or a generic

## 2013-06-05 ENCOUNTER — Other Ambulatory Visit: Payer: Self-pay | Admitting: *Deleted

## 2013-06-05 LAB — MICROALBUMIN / CREATININE URINE RATIO
Creatinine, Urine: 284.7 mg/dL
Microalb Creat Ratio: 4.3 mg/g (ref 0.0–30.0)
Microalb, Ur: 1.23 mg/dL (ref 0.00–1.89)

## 2013-06-05 LAB — LIPID PANEL
HDL: 41 mg/dL (ref >39)
LDL Cholesterol: 112 mg/dL — ABNORMAL HIGH (ref 0–99)
Total CHOL/HDL Ratio: 4.6 Ratio
VLDL: 35 mg/dL (ref 0–40)

## 2013-06-05 LAB — HEMOGLOBIN A1C: Hgb A1c MFr Bld: 10.4 % — ABNORMAL HIGH (ref <5.7)

## 2013-06-05 LAB — HEPATIC FUNCTION PANEL
Albumin: 3.8 g/dL (ref 3.5–5.2)
Bilirubin, Direct: 0.2 mg/dL (ref 0.0–0.3)
Total Bilirubin: 0.9 mg/dL (ref 0.3–1.2)

## 2013-06-05 LAB — RENAL FUNCTION PANEL
CO2: 27 mEq/L (ref 19–32)
Chloride: 100 mEq/L (ref 96–112)
Phosphorus: 3.9 mg/dL (ref 2.3–4.6)
Sodium: 139 mEq/L (ref 135–145)

## 2013-06-05 LAB — MAGNESIUM: Magnesium: 1.6 mg/dL (ref 1.5–2.5)

## 2013-06-05 MED ORDER — SITAGLIPTIN PHOS-METFORMIN HCL 50-500 MG PO TABS: 1.0000 | ORAL_TABLET | Freq: Two times a day (BID) | ORAL | Status: DC

## 2013-06-05 NOTE — Progress Notes (Signed)
I also spoke to pt and I left some Janumet samples at the front desk for her

## 2013-06-06 ENCOUNTER — Encounter: Payer: Self-pay | Admitting: Family Medicine

## 2013-06-06 DIAGNOSIS — R252 Cramp and spasm: Secondary | ICD-10-CM | POA: Insufficient documentation

## 2013-06-06 NOTE — Assessment & Plan Note (Signed)
hgba1c up without medications, have started back her Janumet and encouraged to minimize simple carbs

## 2013-06-06 NOTE — Assessment & Plan Note (Signed)
Encouraged increased hydration, add a ca/mag/zn tab, try Hyland's leg cramp med prn

## 2013-06-06 NOTE — Assessment & Plan Note (Signed)
Well controlled today despite lack of meds, will restart Lisinopril at low doses

## 2013-06-06 NOTE — Progress Notes (Signed)
Patient ID: Stacey Cooper, female   DOB: Nov 28, 1935, 78 y.o.   MRN: [id number] Stacey Cooper [id number] 03-06-1935 06/06/2013      Progress Note-Follow Up  Subjective  Chief Complaint  Chief Complaint  Patient presents with  . Follow-up    thinks sugar is off    HPI  [facility] patient. She has been providing ongoing care for her [place] 6 husband. He has cancer and was undergoing chemoradiation when he had a colon rupture. He's been out of the hospital with multisystem organ failure has been very ill. As a result she's run out of her medications and is not taking anything except for Tylenol now again. She denies any signs of illness is that she physically feels okay. no polyuria or polydipsia. No chest pain, GI or GU concerns noted today. Does acknowledge some palpitations and SOB   Past Medical History  Diagnosis Date  . Diabetes mellitus   . UTI'S, HX OF 01/24/2011  . URINARY INCONTINENCE 02/07/2011  . SQUAMOUS CELL CARCINOMA OTHER SPEC SITES SKIN 01/24/2011  . NONSPEC ELEVATION OF LEVELS OF TRANSAMINASE/LDH 02/07/2011  . Morbid obesity 01/24/2011  . MEASLES, HX OF 01/24/2011  . HAIR LOSS 01/24/2011  . GERD 01/24/2011  . Essential hypertension, benign 01/24/2011  . DM 01/24/2011  . CHOLELITHIASIS, HX OF 02/07/2011  . Basal cell carcinoma of skin of lip 01/24/2011  . ABDOMINAL PAIN, RIGHT UPPER QUADRANT, HX OF 01/24/2011  . Peripheral neuropathy 04/20/2011  . SCC (squamous cell carcinoma), face 07/18/2011  . Actinic keratoses 07/18/2011  . Back pain 12/08/2011  . Hyperlipidemia 04/24/2012  . Insomnia 07/24/2012  . Bronchitis 12/20/2012  . Leg cramps 06/06/2013    Past Surgical History  Procedure Laterality Date  . Abdominal hysterectomy      partial, both ovaries left in place  . Total knee replacement, b/l    . Cataract extraction, b/l    . Skin bx, right anterior cw , squamous cancer, had to redo to margins  12/2010  . Bcc removed below lower lip on left  26 yrs ago  . Left knee  arthroscopy and cleaning prior to tkr      Family History  Problem Relation Age of Onset  . Arthritis Mother     RA  . Myelodysplastic syndrome Mother   . Cancer Father     skin  . Stroke Father   . Stroke Maternal Grandmother   . Kidney failure Maternal Grandfather     History   Social History  . Marital Status: Married    Spouse Name: N/A    Number of Children: N/A  . Years of Education: N/A   Occupational History  . Not on file.   Social History Main Topics  . Smoking status: Never Smoker   . Smokeless tobacco: Never Used  . Alcohol Use: No  . Drug Use: No  . Sexually Active: No   Other Topics Concern  . Not on file   Social History Narrative  . No narrative on file    Current Outpatient Prescriptions on File Prior to Visit  Medication Sig Dispense Refill  . cyclobenzaprine (FLEXERIL) 10 MG tablet       . Lancets MISC by Does not apply route. Check blood sugars qam and once daily prn       . ranitidine (ZANTAC) 300 MG tablet Take 1 tablet (300 mg total) by mouth daily.  90 tablet  3   No current facility-administered medications on file prior to visit.  Allergies  Allergen Reactions  . Metformin     REACTION: nausea, vomit, diarrhea    Review of Systems  Review of Systems  Constitutional: Negative for fever and malaise/fatigue.  HENT: Negative for congestion.   Eyes: Negative for discharge.  Respiratory: Negative for shortness of breath.   Cardiovascular: Negative for chest pain, palpitations and leg swelling.  Gastrointestinal: Negative for nausea, abdominal pain and diarrhea.  Genitourinary: Negative for dysuria.  Musculoskeletal: Positive for myalgias. Negative for falls.  Skin: Negative for rash.  Neurological: Negative for loss of consciousness and headaches.  Endo/Heme/Allergies: Negative for polydipsia.  Psychiatric/Behavioral: Negative for depression and suicidal ideas. The patient is not nervous/anxious and does not have insomnia.      Objective  BP 120/72  Pulse 90  Temp(Src) 97.5 F (36.4 C) (Oral)  Ht 5\' 3"  (1.6 m)  Wt 231 lb 1.3 oz (104.817 kg)  BMI 40.94 kg/m2  SpO2 97%  Physical Exam  Physical Exam  Constitutional: She is oriented to person, place, and time and well-developed, well-nourished, and in no distress. No distress.  HENT:  Head: Normocephalic and atraumatic.  Eyes: Conjunctivae are normal.  Neck: Neck supple. No thyromegaly present.  Cardiovascular: Normal rate, regular rhythm and normal heart sounds.   Pulmonary/Chest: Effort normal and breath sounds normal. She has no wheezes.  Abdominal: She exhibits no distension and no mass.  Musculoskeletal: She exhibits no edema.  Lymphadenopathy:    She has no cervical adenopathy.  Neurological: She is alert and oriented to person, place, and time.  Skin: Skin is warm and dry. No rash noted. She is not diaphoretic.  Psychiatric: Memory, affect and judgment normal.    Lab Results  Component Value Date   TSH 3.285 06/04/2013   Lab Results  Component Value Date   WBC 10.0 06/04/2013   HGB 14.9 06/04/2013   HCT 42.9 06/04/2013   MCV 90.3 06/04/2013   PLT 212 06/04/2013   Lab Results  Component Value Date   CREATININE 0.92 06/04/2013   BUN 15 06/04/2013   NA 139 06/04/2013   K 3.8 06/04/2013   CL 100 06/04/2013   CO2 27 06/04/2013   Lab Results  Component Value Date   ALT 26 06/04/2013   AST 43* 06/04/2013   ALKPHOS 154* 06/04/2013   BILITOT 0.9 06/04/2013   Lab Results  Component Value Date   CHOL 188 06/04/2013   Lab Results  Component Value Date   HDL 41 06/04/2013   Lab Results  Component Value Date   LDLCALC 112* 06/04/2013   Lab Results  Component Value Date   TRIG 173* 06/04/2013   Lab Results  Component Value Date   CHOLHDL 4.6 06/04/2013     Assessment & Plan  ESSENTIAL HYPERTENSION, BENIGN Well controlled today despite lack of meds, will restart Lisinopril at low doses  DM hgba1c up without medications, have started  back her Janumet and encouraged to minimize simple carbs  Hyperlipidemia Will restart Lovastatin, asked to start krill oil and avoid trans fats.  Leg cramps Encouraged increased hydration, add a ca/mag/zn tab, try Hyland's leg cramp med prn

## 2013-06-06 NOTE — Assessment & Plan Note (Signed)
Will restart Lovastatin, asked to start krill oil and avoid trans fats.

## 2013-06-26 ENCOUNTER — Encounter: Payer: Self-pay | Admitting: Family Medicine

## 2013-06-26 ENCOUNTER — Ambulatory Visit (INDEPENDENT_AMBULATORY_CARE_PROVIDER_SITE_OTHER): Payer: Medicare Other | Admitting: Family Medicine

## 2013-06-26 VITALS — BP 110/72 | HR 75 | Temp 97.6°F | Ht 63.0 in | Wt 229.1 lb

## 2013-06-26 DIAGNOSIS — G609 Hereditary and idiopathic neuropathy, unspecified: Secondary | ICD-10-CM

## 2013-06-26 DIAGNOSIS — H9193 Unspecified hearing loss, bilateral: Secondary | ICD-10-CM

## 2013-06-26 DIAGNOSIS — G629 Polyneuropathy, unspecified: Secondary | ICD-10-CM

## 2013-06-26 DIAGNOSIS — E785 Hyperlipidemia, unspecified: Secondary | ICD-10-CM

## 2013-06-26 DIAGNOSIS — H9190 Unspecified hearing loss, unspecified ear: Secondary | ICD-10-CM

## 2013-06-26 DIAGNOSIS — Z0000 Encounter for general adult medical examination without abnormal findings: Secondary | ICD-10-CM

## 2013-06-26 DIAGNOSIS — E119 Type 2 diabetes mellitus without complications: Secondary | ICD-10-CM

## 2013-06-26 DIAGNOSIS — I10 Essential (primary) hypertension: Secondary | ICD-10-CM

## 2013-06-26 NOTE — Patient Instructions (Addendum)
Try a cream with Menthol in it for feet such as Salon Pas or vick's vapor rub. For hip pain can consider Salon Pas patches  Labs prior to visit lipid, renal, cbc, hgba1c, hepatic   Peripheral Neuropathy Peripheral neuropathy is a common disorder of your nerves resulting from damage. CAUSES  This disorder may be caused by a disease of the nerves or illness. Many neuropathies have well known causes such as:  Diabetes. This is one of the most common causes.   Uremia.   AIDS.   Nutritional deficiencies.   Other causes include mechanical pressures. These may be from:   Compression.   Injury.   Contusions or bruises.   Fracture or dislocated bones.   Pressure involving the nerves close to the surface. Nerves such as the ulnar, or radial can be injured by prolonged use of crutches.  Other injuries may come from:  Tumor.   Hemorrhage or bleeding into a nerve.   Exposure to cold or radiation.   Certain medicines or toxic substances (rare).   Vascular or collagen disorders such as:   Atherosclerosis.   Systemic lupus erythematosus.   Scleroderma.   Sarcoidosis.   Rheumatoid arthritis.   Polyarteritis nodosa.   A large number of cases are of unknown cause.  SYMPTOMS  Common problems include:  Weakness.   Numbness.   Abnormal sensations (paresthesia) such as:   Burning.   Tickling.   Pricking.   Tingling.   Pain in the arms, hands, legs and/or feet.  TREATMENT  Therapy for this disorder differs depending on the cause. It may vary from medical treatment with medications or physical therapy among others.   For example, therapy for this disorder caused by diabetes involves control of the diabetes.   In cases where a tumor or ruptured disc is the cause, therapy may involve surgery. This would be to remove the tumor or to repair the ruptured disc.   In entrapment or compression neuropathy, treatment may consist of splinting or surgical decompression of  the ulnar or median nerves. A common example of entrapment neuropathy is carpal tunnel syndrome. This has become more common because of the increasing use of computers.   Peroneal and radial compression neuropathies may require avoidance of pressure.   Physical therapy and/or splints may be useful in preventing contractures. This is a condition in which shortened muscles around joints cause abnormal and sometimes painful positioning of the joints.  Document Released: 12/02/2002 Document Revised: 08/24/2011 Document Reviewed: 12/12/2005 [facility] Patient Information 2012 [place], [place].

## 2013-06-28 ENCOUNTER — Encounter: Payer: Self-pay | Admitting: Family Medicine

## 2013-06-28 DIAGNOSIS — Z0000 Encounter for general adult medical examination without abnormal findings: Secondary | ICD-10-CM | POA: Insufficient documentation

## 2013-06-28 DIAGNOSIS — H9193 Unspecified hearing loss, bilateral: Secondary | ICD-10-CM

## 2013-06-28 HISTORY — DX: Unspecified hearing loss, bilateral: H91.93

## 2013-06-28 NOTE — Assessment & Plan Note (Signed)
Did not tolerate Gabapentin, encouraged to try topical menthol creams to see if that is helpful

## 2013-06-28 NOTE — Assessment & Plan Note (Addendum)
Annual eye exam with [facility] on 06/14/13. Had stopped all of her meds prior to her most recent hgba1c of 10.4 has restarted them and notes a sugar of 148 this am, encouraged to continue meds and avoid simple carbs. Foot exam unremarkable

## 2013-06-28 NOTE — Assessment & Plan Note (Signed)
Tolerating Mevacor, avoid trans fats.

## 2013-06-28 NOTE — Assessment & Plan Note (Signed)
Well controlled no changes 

## 2013-06-28 NOTE — Progress Notes (Signed)
Patient ID: Stacey Cooper, female   DOB: 11-Jul-1935, 78 y.o.   MRN: [id number] Stacey Cooper [id number] July 04, 1935 06/28/2013      Progress Note-Follow Up  Subjective  Chief Complaint  Chief Complaint  Patient presents with  . Annual Exam    medicare wellness    HPI  Patient is a 78 year old Caucasian female who is in today for annual exam. She's recently had her eye exam with the ophthalmologist and no new concerns identified. She's feeling generally well over she has continues to struggle with peripheral neuropathy. Did not tolerate gabapentin as it caused excess sedation. She does note recent trouble with hearing loss. Feels it is worse in the left than the right and with her husband's ailing health is concerned she will hear him if he has a crisis. Reports blood sugars have improved since she started taking her medications again. Her a.m. blood sugar today was 148. Denies polyuria or polydipsia. Has recently had her mammogram as well. No chest pain or palpitations. No shortness or breath GI or GU complaints noted today.   Past Medical History  Diagnosis Date  . Diabetes mellitus   . UTI'S, HX OF 01/24/2011  . URINARY INCONTINENCE 02/07/2011  . SQUAMOUS CELL CARCINOMA OTHER SPEC SITES SKIN 01/24/2011  . NONSPEC ELEVATION OF LEVELS OF TRANSAMINASE/LDH 02/07/2011  . Morbid obesity 01/24/2011  . MEASLES, HX OF 01/24/2011  . HAIR LOSS 01/24/2011  . GERD 01/24/2011  . Essential hypertension, benign 01/24/2011  . DM 01/24/2011  . CHOLELITHIASIS, HX OF 02/07/2011  . Basal cell carcinoma of skin of lip 01/24/2011  . ABDOMINAL PAIN, RIGHT UPPER QUADRANT, HX OF 01/24/2011  . Peripheral neuropathy 04/20/2011  . SCC (squamous cell carcinoma), face 07/18/2011  . Actinic keratoses 07/18/2011  . Back pain 12/08/2011  . Hyperlipidemia 04/24/2012  . Insomnia 07/24/2012  . Bronchitis 12/20/2012  . Leg cramps 06/06/2013    Past Surgical History  Procedure Laterality Date  . Abdominal hysterectomy     partial, both ovaries left in place  . Total knee replacement, b/l    . Cataract extraction, b/l    . Skin bx, right anterior cw , squamous cancer, had to redo to margins  12/2010  . Bcc removed below lower lip on left  26 yrs ago  . Left knee arthroscopy and cleaning prior to tkr      Family History  Problem Relation Age of Onset  . Arthritis Mother     RA  . Myelodysplastic syndrome Mother   . Cancer Father     skin  . Stroke Father   . Stroke Maternal Grandmother   . Kidney failure Maternal Grandfather     History   Social History  . Marital Status: Married    Spouse Name: N/A    Number of Children: N/A  . Years of Education: N/A   Occupational History  . Not on file.   Social History Main Topics  . Smoking status: Never Smoker   . Smokeless tobacco: Never Used  . Alcohol Use: No  . Drug Use: No  . Sexually Active: No   Other Topics Concern  . Not on file   Social History Narrative  . No narrative on file    Current Outpatient Prescriptions on File Prior to Visit  Medication Sig Dispense Refill  . cyclobenzaprine (FLEXERIL) 10 MG tablet       . Lancets MISC by Does not apply route. Check blood sugars qam and once daily prn       .  lisinopril (PRINIVIL,ZESTRIL) 5 MG tablet Take 1 tablet (5 mg total) by mouth daily.  90 tablet  3  . lovastatin (MEVACOR) 10 MG tablet Take 1 tablet (10 mg total) by mouth at bedtime.  90 tablet  3  . ranitidine (ZANTAC) 300 MG tablet Take 1 tablet (300 mg total) by mouth daily.  90 tablet  3  . sitaGLIPtan-metformin (JANUMET) 50-500 MG per tablet Take 1 tablet by mouth 2 (two) times daily with a meal.  60 tablet  3   No current facility-administered medications on file prior to visit.    Allergies  Allergen Reactions  . Metformin     REACTION: nausea, vomit, diarrhea    Review of Systems  Review of Systems  Constitutional: Positive for malaise/fatigue. Negative for fever and chills.  HENT: Negative for hearing loss,  nosebleeds and congestion.   Eyes: Negative for pain and discharge.  Respiratory: Negative for cough, sputum production, shortness of breath and wheezing.   Cardiovascular: Negative for chest pain, palpitations and leg swelling.  Gastrointestinal: Negative for heartburn, nausea, vomiting, abdominal pain, diarrhea, constipation and blood in stool.  Genitourinary: Negative for dysuria, urgency, frequency and hematuria.  Musculoskeletal: Negative for myalgias, back pain and falls.  Skin: Negative for rash.  Neurological: Positive for tingling and sensory change. Negative for dizziness, tremors, focal weakness, loss of consciousness, weakness and headaches.  Endo/Heme/Allergies: Negative for polydipsia. Does not bruise/bleed easily.  Psychiatric/Behavioral: Negative for depression and suicidal ideas. The patient is nervous/anxious. The patient does not have insomnia.     Objective  BP 110/72  Pulse 75  Temp(Src) 97.6 F (36.4 C) (Oral)  Ht 5\' 3"  (1.6 m)  Wt 229 lb 1.9 oz (103.928 kg)  BMI 40.6 kg/m2  SpO2 97%  Physical Exam  Physical Exam  Constitutional: She is oriented to person, place, and time and well-developed, well-nourished, and in no distress. No distress.  HENT:  Head: Normocephalic and atraumatic.  Eyes: Conjunctivae are normal.  Neck: Neck supple. No thyromegaly present.  Cardiovascular: Normal rate and regular rhythm.  Exam reveals no gallop.   Pulmonary/Chest: Effort normal and breath sounds normal. She has no wheezes.  Abdominal: She exhibits no distension and no mass.  Musculoskeletal: She exhibits no edema.  Foot exam 2 + posterior tibial pulses b/l. Thin skin but intact on b/l feet. Filament testing reveals sensation intact until toes.   Lymphadenopathy:    She has no cervical adenopathy.  Neurological: She is alert and oriented to person, place, and time.  Skin: Skin is warm and dry. No rash noted. She is not diaphoretic.  Psychiatric: Memory, affect and  judgment normal.    Lab Results  Component Value Date   TSH 3.285 06/04/2013   Lab Results  Component Value Date   WBC 10.0 06/04/2013   HGB 14.9 06/04/2013   HCT 42.9 06/04/2013   MCV 90.3 06/04/2013   PLT 212 06/04/2013   Lab Results  Component Value Date   CREATININE 0.92 06/04/2013   BUN 15 06/04/2013   NA 139 06/04/2013   K 3.8 06/04/2013   CL 100 06/04/2013   CO2 27 06/04/2013   Lab Results  Component Value Date   ALT 26 06/04/2013   AST 43* 06/04/2013   ALKPHOS 154* 06/04/2013   BILITOT 0.9 06/04/2013   Lab Results  Component Value Date   CHOL 188 06/04/2013   Lab Results  Component Value Date   HDL 41 06/04/2013   Lab Results  Component Value Date  LDLCALC 112* 06/04/2013   Lab Results  Component Value Date   TRIG 173* 06/04/2013   Lab Results  Component Value Date   CHOLHDL 4.6 06/04/2013     Assessment & Plan  ESSENTIAL HYPERTENSION, BENIGN Well controlled no changes  Peripheral neuropathy Did not tolerate Gabapentin, encouraged to try topical menthol creams to see if that is helpful  DM Annual eye exam with [facility] on 06/14/13. Had stopped all of her meds prior to her most recent hgba1c of 10.4 has restarted them and notes a sugar of 148 this am, encouraged to continue meds and avoid simple carbs. Foot exam unremarkable  Hyperlipidemia Tolerating Mevacor, avoid trans fats.  Medicare annual wellness visit, subsequent Patient struggling with husband's poor health but is doing well other wise Sees Dr [facility] Next colonoscopy due in 2015  Patient reports no difficulty with memory or ADLs Denies any safety concerns or depression MGM was normal in 03/2013 Patient without advanced directives, is encouraged to consider  Hearing loss of both ears Referred for audiology examination

## 2013-06-28 NOTE — Assessment & Plan Note (Addendum)
Patient struggling with husband's poor health but is doing well other wise Sees Dr [facility] for Opthamology Next colonoscopy due in 2015  Patient reports no difficulty with memory or ADLs Denies any safety concerns or depression MGM was normal in 03/2013 Patient without advanced directives, is encouraged to consider

## 2013-06-28 NOTE — Assessment & Plan Note (Signed)
Referred for audiology examination

## 2013-07-04 ENCOUNTER — Other Ambulatory Visit: Payer: Self-pay

## 2013-07-23 ENCOUNTER — Encounter: Payer: Self-pay | Admitting: Family Medicine

## 2013-07-29 ENCOUNTER — Telehealth: Payer: Self-pay

## 2013-07-29 NOTE — Telephone Encounter (Signed)
Pt called asking if we had any samples of Janumet.   I called and informed pt that I put a box of Janumet in the front cabinet for her to come by and pick up

## 2013-09-02 ENCOUNTER — Telehealth: Payer: Self-pay

## 2013-09-02 DIAGNOSIS — E785 Hyperlipidemia, unspecified: Secondary | ICD-10-CM

## 2013-09-02 DIAGNOSIS — E119 Type 2 diabetes mellitus without complications: Secondary | ICD-10-CM

## 2013-09-02 DIAGNOSIS — I10 Essential (primary) hypertension: Secondary | ICD-10-CM

## 2013-09-02 LAB — HEPATIC FUNCTION PANEL
Albumin: 4.1 g/dL (ref 3.5–5.2)
Bilirubin, Direct: 0.2 mg/dL (ref 0.0–0.3)
Total Bilirubin: 0.8 mg/dL (ref 0.3–1.2)

## 2013-09-02 LAB — RENAL FUNCTION PANEL
Albumin: 4.1 g/dL (ref 3.5–5.2)
BUN: 10 mg/dL (ref 6–23)
CO2: 30 mEq/L (ref 19–32)
Calcium: 9.6 mg/dL (ref 8.4–10.5)
Chloride: 103 mEq/L (ref 96–112)
Creat: 0.71 mg/dL (ref 0.50–1.10)
Glucose, Bld: 144 mg/dL — ABNORMAL HIGH (ref 70–99)
Phosphorus: 3.5 mg/dL (ref 2.3–4.6)
Potassium: 4.1 mEq/L (ref 3.5–5.3)
Sodium: 140 mEq/L (ref 135–145)

## 2013-09-02 LAB — LIPID PANEL
Cholesterol: 152 mg/dL (ref 0–200)
HDL: 41 mg/dL (ref >39)
LDL Cholesterol: 81 mg/dL (ref 0–99)
Total CHOL/HDL Ratio: 3.7 Ratio
Triglycerides: 152 mg/dL — ABNORMAL HIGH (ref <150)
VLDL: 30 mg/dL (ref 0–40)

## 2013-09-02 LAB — CBC
HCT: 42.2 % (ref 36.0–46.0)
Platelets: 199 10*3/uL (ref 150–400)
RBC: 4.58 MIL/uL (ref 3.87–5.11)
RDW: 13.4 % (ref 11.5–15.5)
WBC: 9 10*3/uL (ref 4.0–10.5)

## 2013-09-02 LAB — HEMOGLOBIN A1C
Hgb A1c MFr Bld: 7.6 % — ABNORMAL HIGH (ref <5.7)
Mean Plasma Glucose: 171 mg/dL — ABNORMAL HIGH (ref <117)

## 2013-09-02 NOTE — Telephone Encounter (Signed)
Lab order placed.

## 2013-09-05 ENCOUNTER — Encounter: Payer: Self-pay | Admitting: Family Medicine

## 2013-09-05 ENCOUNTER — Telehealth: Payer: Self-pay | Admitting: Family Medicine

## 2013-09-05 ENCOUNTER — Ambulatory Visit (INDEPENDENT_AMBULATORY_CARE_PROVIDER_SITE_OTHER): Payer: Medicare Other | Admitting: Family Medicine

## 2013-09-05 VITALS — BP 118/72 | HR 79 | Temp 98.3°F | Ht 63.0 in | Wt 228.0 lb

## 2013-09-05 DIAGNOSIS — E785 Hyperlipidemia, unspecified: Secondary | ICD-10-CM

## 2013-09-05 DIAGNOSIS — I10 Essential (primary) hypertension: Secondary | ICD-10-CM

## 2013-09-05 DIAGNOSIS — E119 Type 2 diabetes mellitus without complications: Secondary | ICD-10-CM

## 2013-09-05 DIAGNOSIS — Z23 Encounter for immunization: Secondary | ICD-10-CM

## 2013-09-05 MED ORDER — SITAGLIPTIN PHOS-METFORMIN HCL 50-500 MG PO TABS: 1.0000 | ORAL_TABLET | Freq: Two times a day (BID) | ORAL | Status: DC

## 2013-09-05 NOTE — Telephone Encounter (Signed)
Labs prior to next visit, lipid, renal, cbc, tsh, hgba1c, hepatic   Patient has an appointment mid December and will be going to [facility] lab

## 2013-09-05 NOTE — Patient Instructions (Addendum)

## 2013-09-05 NOTE — Progress Notes (Signed)
Patient ID: Stacey Cooper, female   DOB: 1935-03-04, 78 y.o.   MRN: [id number]  Stacey Cooper [id number] [birth year] 09/05/2013      Progress Note-Follow Up  Subjective  Chief Complaint  Chief Complaint  Patient presents with  . Follow-up  . Injections    flu    HPI  Patient is a 78 year old Caucasian female who is in today for followup on multiple medical conditions. Her blood sugars have been greatly improved. She reports numbers in the 130 to 160 range. Decreased polyuria and polydipsia. She continues to care for her ailing husband but is otherwise doing well. No recent illness. No chest pain, palpitations, shortness of breath, GI or GU concerns noted today. Taking meds as prescribed  Past Medical History  Diagnosis Date  . Diabetes mellitus   . UTI'S, HX OF 01/24/2011  . URINARY INCONTINENCE 02/07/2011  . SQUAMOUS CELL CARCINOMA OTHER SPEC SITES SKIN 01/24/2011  . NONSPEC ELEVATION OF LEVELS OF TRANSAMINASE/LDH 02/07/2011  . Morbid obesity 01/24/2011  . MEASLES, HX OF 01/24/2011  . HAIR LOSS 01/24/2011  . GERD 01/24/2011  . Essential hypertension, benign 01/24/2011  . DM 01/24/2011  . CHOLELITHIASIS, HX OF 02/07/2011  . Basal cell carcinoma of skin of lip 01/24/2011  . ABDOMINAL PAIN, RIGHT UPPER QUADRANT, HX OF 01/24/2011  . Peripheral neuropathy 04/20/2011  . SCC (squamous cell carcinoma), face 07/18/2011  . Actinic keratoses 07/18/2011  . Back pain 12/08/2011  . Hyperlipidemia 04/24/2012  . Insomnia 07/24/2012  . Bronchitis 12/20/2012  . Leg cramps 06/06/2013  . Hearing loss of both ears 06/28/2013    Past Surgical History  Procedure Laterality Date  . Abdominal hysterectomy      partial, both ovaries left in place  . Total knee replacement, b/l    . Cataract extraction, b/l    . Skin bx, right anterior cw , squamous cancer, had to redo to margins  12/2010  . Bcc removed below lower lip on left  26 yrs ago  . Left knee arthroscopy and cleaning prior to tkr      Family  History  Problem Relation Age of Onset  . Arthritis Mother     RA  . Myelodysplastic syndrome Mother   . Cancer Father     skin  . Stroke Father   . Stroke Maternal Grandmother   . Kidney failure Maternal Grandfather     History   Social History  . Marital Status: Married    Spouse Name: N/A    Number of Children: N/A  . Years of Education: N/A   Occupational History  . Not on file.   Social History Main Topics  . Smoking status: Never Smoker   . Smokeless tobacco: Never Used  . Alcohol Use: No  . Drug Use: No  . Sexual Activity: No   Other Topics Concern  . Not on file   Social History Narrative  . No narrative on file    Current Outpatient Prescriptions on File Prior to Visit  Medication Sig Dispense Refill  . cyclobenzaprine (FLEXERIL) 10 MG tablet       . Lancets MISC by Does not apply route. Check blood sugars qam and once daily prn       . lisinopril (PRINIVIL,ZESTRIL) 5 MG tablet Take 1 tablet (5 mg total) by mouth daily.  90 tablet  3  . lovastatin (MEVACOR) 10 MG tablet Take 1 tablet (10 mg total) by mouth at bedtime.  90 tablet  3  .  ranitidine (ZANTAC) 300 MG tablet Take 1 tablet (300 mg total) by mouth daily.  90 tablet  3  . sitaGLIPtan-metformin (JANUMET) 50-500 MG per tablet Take 1 tablet by mouth 2 (two) times daily with a meal.  60 tablet  3   No current facility-administered medications on file prior to visit.    Allergies  Allergen Reactions  . Metformin     REACTION: nausea, vomit, diarrhea    Review of Systems  Review of Systems  Constitutional: Negative for fever and malaise/fatigue.  HENT: Negative for congestion.   Eyes: Negative for discharge.  Respiratory: Negative for shortness of breath.   Cardiovascular: Negative for chest pain, palpitations and leg swelling.  Gastrointestinal: Negative for nausea, abdominal pain and diarrhea.  Genitourinary: Negative for dysuria.  Musculoskeletal: Negative for falls.  Skin: Negative for  rash.  Neurological: Negative for loss of consciousness and headaches.  Endo/Heme/Allergies: Negative for polydipsia.  Psychiatric/Behavioral: Positive for depression. Negative for suicidal ideas. The patient is nervous/anxious. The patient does not have insomnia.     Objective  BP 118/72  Pulse 79  Temp(Src) 98.3 F (36.8 C) (Oral)  Ht 5\' 3"  (1.6 m)  Wt 228 lb (103.42 kg)  BMI 40.4 kg/m2  SpO2 92%  Physical Exam  Physical Exam  Constitutional: She is oriented to person, place, and time and well-developed, well-nourished, and in no distress. No distress.  HENT:  Head: Normocephalic and atraumatic.  Eyes: Conjunctivae are normal.  Neck: Neck supple. No thyromegaly present.  Cardiovascular: Normal rate, regular rhythm and normal heart sounds.   No murmur heard. Pulmonary/Chest: Effort normal and breath sounds normal. She has no wheezes.  Abdominal: She exhibits no distension and no mass.  Musculoskeletal: She exhibits no edema.  Lymphadenopathy:    She has no cervical adenopathy.  Neurological: She is alert and oriented to person, place, and time.  Skin: Skin is warm and dry. No rash noted. She is not diaphoretic.  Psychiatric: Memory, affect and judgment normal.    Lab Results  Component Value Date   TSH 3.285 06/04/2013   Lab Results  Component Value Date   WBC 9.0 09/02/2013   HGB 14.2 09/02/2013   HCT 42.2 09/02/2013   MCV 92.1 09/02/2013   PLT 199 09/02/2013   Lab Results  Component Value Date   CREATININE 0.71 09/02/2013   BUN 10 09/02/2013   NA 140 09/02/2013   K 4.1 09/02/2013   CL 103 09/02/2013   CO2 30 09/02/2013   Lab Results  Component Value Date   ALT 25 09/02/2013   AST 50* 09/02/2013   ALKPHOS 111 09/02/2013   BILITOT 0.8 09/02/2013   Lab Results  Component Value Date   CHOL 152 09/02/2013   Lab Results  Component Value Date   HDL 41 09/02/2013   Lab Results  Component Value Date   LDLCALC 81 09/02/2013   Lab Results  Component Value Date   TRIG 152* 09/02/2013    Lab Results  Component Value Date   CHOLHDL 3.7 09/02/2013     Assessment & Plan  ESSENTIAL HYPERTENSION, BENIGN Well controlled no changes  DM hgba1c improved, avoid simple carbs and continue current meds, increase activity as tolerated. Given flu shot today  Hyperlipidemia Tolerating Mevacor, avoid trans fats, increase activity as tolerated  Severe obesity (BMI >= 40) Reinforced need for DASH diet

## 2013-09-08 NOTE — Assessment & Plan Note (Signed)
hgba1c improved, avoid simple carbs and continue current meds, increase activity as tolerated. Given flu shot today

## 2013-09-08 NOTE — Assessment & Plan Note (Signed)
Tolerating Mevacor, avoid trans fats, increase activity as tolerated

## 2013-09-08 NOTE — Assessment & Plan Note (Signed)
Reinforced need for DASH diet

## 2013-09-08 NOTE — Assessment & Plan Note (Signed)
Well controlled no changes 

## 2013-12-05 ENCOUNTER — Ambulatory Visit: Payer: Medicare Other | Admitting: Family Medicine

## 2014-01-03 ENCOUNTER — Ambulatory Visit: Payer: Medicare Other | Admitting: Family Medicine

## 2014-01-09 LAB — RENAL FUNCTION PANEL
Albumin: 3.7 g/dL (ref 3.5–5.2)
BUN: 10 mg/dL (ref 6–23)
CO2: 28 mEq/L (ref 19–32)
Calcium: 9.2 mg/dL (ref 8.4–10.5)
Chloride: 104 mEq/L (ref 96–112)
Creat: 0.8 mg/dL (ref 0.50–1.10)
Glucose, Bld: 170 mg/dL — ABNORMAL HIGH (ref 70–99)
Phosphorus: 3.4 mg/dL (ref 2.3–4.6)
Potassium: 4.1 mEq/L (ref 3.5–5.3)
Sodium: 139 mEq/L (ref 135–145)

## 2014-01-09 LAB — HEPATIC FUNCTION PANEL
ALT: 21 U/L (ref 0–35)
AST: 41 U/L — ABNORMAL HIGH (ref 0–37)
Albumin: 3.7 g/dL (ref 3.5–5.2)
Alkaline Phosphatase: 131 U/L — ABNORMAL HIGH (ref 39–117)
Bilirubin, Direct: 0.2 mg/dL (ref 0.0–0.3)
Indirect Bilirubin: 0.6 mg/dL (ref 0.0–0.9)
Total Bilirubin: 0.8 mg/dL (ref 0.3–1.2)
Total Protein: 6.7 g/dL (ref 6.0–8.3)

## 2014-01-09 LAB — LIPID PANEL
Cholesterol: 152 mg/dL (ref 0–200)
HDL: 39 mg/dL — ABNORMAL LOW (ref >39)
LDL Cholesterol: 86 mg/dL (ref 0–99)
Total CHOL/HDL Ratio: 3.9 Ratio
Triglycerides: 133 mg/dL (ref <150)
VLDL: 27 mg/dL (ref 0–40)

## 2014-01-09 LAB — CBC
HCT: 41.6 % (ref 36.0–46.0)
Hemoglobin: 14.3 g/dL (ref 12.0–15.0)
MCH: 31.3 pg (ref 26.0–34.0)
MCHC: 34.4 g/dL (ref 30.0–36.0)
MCV: 91 fL (ref 78.0–100.0)
Platelets: 210 10*3/uL (ref 150–400)
RBC: 4.57 MIL/uL (ref 3.87–5.11)
RDW: 13.1 % (ref 11.5–15.5)
WBC: 8.7 10*3/uL (ref 4.0–10.5)

## 2014-01-09 LAB — HEMOGLOBIN A1C
Hgb A1c MFr Bld: 8.4 % — ABNORMAL HIGH (ref <5.7)
Mean Plasma Glucose: 194 mg/dL — ABNORMAL HIGH (ref <117)

## 2014-01-10 LAB — TSH: TSH: 3.15 u[IU]/mL (ref 0.350–4.500)

## 2014-01-14 ENCOUNTER — Ambulatory Visit: Payer: Medicare Other | Admitting: Family Medicine

## 2014-01-17 ENCOUNTER — Encounter: Payer: Self-pay | Admitting: Family Medicine

## 2014-01-17 ENCOUNTER — Ambulatory Visit (INDEPENDENT_AMBULATORY_CARE_PROVIDER_SITE_OTHER): Payer: Medicare Other | Admitting: Family Medicine

## 2014-01-17 VITALS — BP 122/74 | HR 77 | Temp 97.6°F | Ht 63.0 in | Wt 226.0 lb

## 2014-01-17 DIAGNOSIS — E785 Hyperlipidemia, unspecified: Secondary | ICD-10-CM

## 2014-01-17 DIAGNOSIS — E119 Type 2 diabetes mellitus without complications: Secondary | ICD-10-CM

## 2014-01-17 DIAGNOSIS — I10 Essential (primary) hypertension: Secondary | ICD-10-CM

## 2014-01-17 DIAGNOSIS — F4321 Adjustment disorder with depressed mood: Secondary | ICD-10-CM

## 2014-01-17 DIAGNOSIS — K219 Gastro-esophageal reflux disease without esophagitis: Secondary | ICD-10-CM

## 2014-01-17 MED ORDER — RANITIDINE HCL 300 MG PO TABS: 300.0000 mg | ORAL_TABLET | Freq: Every day | ORAL | Status: DC

## 2014-01-17 MED ORDER — SITAGLIPTIN PHOS-METFORMIN HCL 50-1000 MG PO TABS: 1.0000 | ORAL_TABLET | Freq: Two times a day (BID) | ORAL | Status: DC

## 2014-01-17 NOTE — Progress Notes (Signed)
Pre visit review using our clinic review tool, if applicable. No additional management support is needed unless otherwise documented below in the visit note. 

## 2014-01-17 NOTE — Patient Instructions (Addendum)
Prevnar is shot that will cover pneumonia, call insurance and make sure they cover.  Call [place] with name of Glucometer so we can send in Lancets   Basic Carbohydrate Counting Basic carbohydrate counting is a way to plan meals. It is done by counting the amount of carbohydrate in foods. Foods that have carbohydrates are starches (grains, beans, starchy vegetables) and sweets. Eating carbohydrates increases blood glucose (sugar) levels. People with diabetes use carbohydrate counting to help keep their blood glucose at a normal level.  COUNTING CARBOHYDRATES IN FOODS The first step in counting carbohydrates is to learn how many carbohydrate servings you should have in every meal. A dietitian can plan this for you. After learning the amount of carbohydrates to include in your meal plan, you can start to choose the carbohydrate-containing foods you want to eat.  There are 2 ways to identify the amount of carbohydrates in the foods you eat.  Read the Nutrition Facts panel on food labels. You need 2 pieces of information from the Nutrition Facts panel to count carbohydrates this way:  Serving size.  Total carbohydrate (in grams). Decide how many servings you will be eating. If it is 1 serving, you will be eating the amount of carbohydrate listed on the panel. If you will be eating 2 servings, you will be eating double the amount of carbohydrate listed on the panel.   Learn serving sizes. A serving size of most carbohydrate-containing foods is about 15 grams (g). Listed below are single serving sizes of common carbohydrate-containing foods:  1 slice bread.   cup unsweetened, dry cereal.   cup hot cereal.   cup rice.   cup mashed potatoes.   cup pasta.  1 cup fresh fruit.   cup canned fruit.  1 cup milk (whole, 2%, or skim).   cup starchy vegetables (peas, corn, or potatoes). Counting carbohydrates this way is similar to looking on the Nutrition Facts panel. Decide how many  servings you will eat first. Multiply the number of servings you eat by 15 g. For example, if you have 2 cups of strawberries, you had 2 servings. That means you had 30 g of carbohydrate (2 servings x 15 g = 30 g). CALCULATING CARBOHYDRATES IN A MEAL Sample dinner  3 oz chicken breast.   cup brown rice.   cup corn.  1 cup fat-free milk.  1 cup strawberries with sugar-free whipped topping. Carbohydrate calculation First, identify the foods that contain carbohydrate:  Rice.  Corn.  Milk.  Strawberries. Calculate the number of servings eaten:  2 servings rice.  1 serving corn.  1 serving milk.  1 serving strawberries. Multiply the number of servings by 15 g:  2 servings rice x 15 g = 30 g.  1 serving corn x 15 g = 15 g.  1 serving milk x 15 g = 15 g.  1 serving strawberries x 15 g = 15 g. Add the amounts to find the total carbohydrates eaten: 30 g + 15 g + 15 g + 15 g = 75 g carbohydrate eaten at dinner. Document Released: 12/12/2005 Document Revised: 03/05/2012 Document Reviewed: 10/28/2011 [facility] Patient Information 2014 [place], [place].

## 2014-01-17 NOTE — Assessment & Plan Note (Signed)
Well controlled, no changes today 

## 2014-01-17 NOTE — Assessment & Plan Note (Addendum)
hgba1c is up. Her husband has just passed from metastatic cancer and she has not been taking good care of her self, will up Janumet to 50/1000 bid. Minimize simple carbs.

## 2014-01-17 NOTE — Progress Notes (Signed)
Patient ID: Stacey Cooper, female   DOB: [birth year], 79 y.o.   MRN: [id number] Stacey Cooper [id number] 1935-12-16 01/17/2014      Progress Note-Follow Up  Subjective  Chief Complaint  Chief Complaint  Patient presents with  . Follow-up    3 month    HPI  Patient is a 79 year old female who is been widowed since her last couple months. Her husband died of metastatic cancer. She is still grieving but feels she's handling it relatively well. She is sleeping he is fairly well and eating. Denies recent illness. No chest pain, palpitations, shortness of breath or GI or GU concerns, taking meds as prescribed  Past Medical History  Diagnosis Date  . Diabetes mellitus   . UTI'S, HX OF 01/24/2011  . URINARY INCONTINENCE 02/07/2011  . SQUAMOUS CELL CARCINOMA OTHER SPEC SITES SKIN 01/24/2011  . NONSPEC ELEVATION OF LEVELS OF TRANSAMINASE/LDH 02/07/2011  . Morbid obesity 01/24/2011  . MEASLES, HX OF 01/24/2011  . HAIR LOSS 01/24/2011  . GERD 01/24/2011  . Essential hypertension, benign 01/24/2011  . DM 01/24/2011  . CHOLELITHIASIS, HX OF 02/07/2011  . Basal cell carcinoma of skin of lip 01/24/2011  . ABDOMINAL PAIN, RIGHT UPPER QUADRANT, HX OF 01/24/2011  . Peripheral neuropathy 04/20/2011  . SCC (squamous cell carcinoma), face 07/18/2011  . Actinic keratoses 07/18/2011  . Back pain 12/08/2011  . Hyperlipidemia 04/24/2012  . Insomnia 07/24/2012  . Bronchitis 12/20/2012  . Leg cramps 06/06/2013  . Hearing loss of both ears 06/28/2013    Past Surgical History  Procedure Laterality Date  . Abdominal hysterectomy      partial, both ovaries left in place  . Total knee replacement, b/l    . Cataract extraction, b/l    . Skin bx, right anterior cw , squamous cancer, had to redo to margins  12/2010  . Bcc removed below lower lip on left  26 yrs ago  . Left knee arthroscopy and cleaning prior to tkr      Family History  Problem Relation Age of Onset  . Arthritis Mother     RA  . Myelodysplastic  syndrome Mother   . Cancer Father     skin  . Stroke Father   . Stroke Maternal Grandmother   . Kidney failure Maternal Grandfather     History   Social History  . Marital Status: Married    Spouse Name: N/A    Number of Children: N/A  . Years of Education: N/A   Occupational History  . Not on file.   Social History Main Topics  . Smoking status: Never Smoker   . Smokeless tobacco: Never Used  . Alcohol Use: No  . Drug Use: No  . Sexual Activity: No   Other Topics Concern  . Not on file   Social History Narrative  . No narrative on file    Current Outpatient Prescriptions on File Prior to Visit  Medication Sig Dispense Refill  . Lancets MISC by Does not apply route. Check blood sugars qam and once daily prn       . lisinopril (PRINIVIL,ZESTRIL) 5 MG tablet Take 1 tablet (5 mg total) by mouth daily.  90 tablet  3  . lovastatin (MEVACOR) 10 MG tablet Take 1 tablet (10 mg total) by mouth at bedtime.  90 tablet  3  . sitaGLIPtin-metformin (JANUMET) 50-500 MG per tablet Take 1 tablet by mouth 2 (two) times daily with a meal.  180 tablet  1  No current facility-administered medications on file prior to visit.    Allergies  Allergen Reactions  . Metformin     REACTION: nausea, vomit, diarrhea    Review of Systems  Review of Systems  Constitutional: Negative for fever and malaise/fatigue.  HENT: Negative for congestion.   Eyes: Negative for discharge.  Respiratory: Negative for shortness of breath.   Cardiovascular: Negative for chest pain, palpitations and leg swelling.  Gastrointestinal: Negative for nausea, abdominal pain and diarrhea.  Genitourinary: Negative for dysuria.  Musculoskeletal: Negative for falls.  Skin: Negative for rash.  Neurological: Negative for loss of consciousness and headaches.  Endo/Heme/Allergies: Negative for polydipsia.  Psychiatric/Behavioral: Negative for depression and suicidal ideas. The patient is not nervous/anxious and does  not have insomnia.     Objective  BP 122/74  Pulse 77  Temp(Src) 97.6 F (36.4 C) (Oral)  Ht 5\' 3"  (1.6 m)  Wt 226 lb 0.6 oz (102.531 kg)  BMI 40.05 kg/m2  SpO2 97%  Physical Exam  Physical Exam  Constitutional: She is oriented to person, place, and time and well-developed, well-nourished, and in no distress. No distress.  HENT:  Head: Normocephalic and atraumatic.  Eyes: Conjunctivae are normal.  Neck: Neck supple. No thyromegaly present.  Cardiovascular: Normal rate, regular rhythm and normal heart sounds.   No murmur heard. Pulmonary/Chest: Effort normal and breath sounds normal. She has no wheezes.  Abdominal: She exhibits no distension and no mass.  Musculoskeletal: She exhibits no edema.  Lymphadenopathy:    She has no cervical adenopathy.  Neurological: She is alert and oriented to person, place, and time.  Skin: Skin is warm and dry. No rash noted. She is not diaphoretic.  Psychiatric: Memory, affect and judgment normal.    Lab Results  Component Value Date   TSH 3.150 01/09/2014   Lab Results  Component Value Date   WBC 8.7 01/09/2014   HGB 14.3 01/09/2014   HCT 41.6 01/09/2014   MCV 91.0 01/09/2014   PLT 210 01/09/2014   Lab Results  Component Value Date   CREATININE 0.80 01/09/2014   BUN 10 01/09/2014   NA 139 01/09/2014   K 4.1 01/09/2014   CL 104 01/09/2014   CO2 28 01/09/2014   Lab Results  Component Value Date   ALT 21 01/09/2014   AST 41* 01/09/2014   ALKPHOS 131* 01/09/2014   BILITOT 0.8 01/09/2014   Lab Results  Component Value Date   CHOL 152 01/09/2014   Lab Results  Component Value Date   HDL 39* 01/09/2014   Lab Results  Component Value Date   LDLCALC 86 01/09/2014   Lab Results  Component Value Date   TRIG 133 01/09/2014   Lab Results  Component Value Date   CHOLHDL 3.9 01/09/2014     Assessment & Plan  ESSENTIAL HYPERTENSION, BENIGN Well controlled, no changes today  DM hgba1c is up. Her husband has just passed from  metastatic cancer and she has not been taking good care of her self, will up Janumet to 50/1000 bid. Minimize simple carbs.  Hyperlipidemia Tolerating Mevacor good response, no cahnges  GERD Tolerating Ranitidine, no changes  Grief reaction Her husband died in 02-Nov-2013 of metastatic cancer. She was upset with hospice at the end but is encouraged to access their bereavement services. She agrees to consider

## 2014-01-17 NOTE — Assessment & Plan Note (Signed)
Tolerating Mevacor good response, no cahnges

## 2014-01-17 NOTE — Assessment & Plan Note (Signed)
Tolerating Ranitidine, no changes

## 2014-01-20 ENCOUNTER — Encounter: Payer: Self-pay | Admitting: Family Medicine

## 2014-01-20 DIAGNOSIS — F4320 Adjustment disorder, unspecified: Secondary | ICD-10-CM

## 2014-01-20 DIAGNOSIS — F4321 Adjustment disorder with depressed mood: Secondary | ICD-10-CM

## 2014-01-20 HISTORY — DX: Adjustment disorder, unspecified: F43.20

## 2014-01-20 HISTORY — DX: Adjustment disorder with depressed mood: F43.21

## 2014-01-20 NOTE — Assessment & Plan Note (Signed)
Her husband died in November of 2014 of metastatic cancer. She was upset with hospice at the end but is encouraged to access their bereavement services. She agrees to consider

## 2014-01-21 ENCOUNTER — Telehealth: Payer: Self-pay

## 2014-01-21 NOTE — Telephone Encounter (Signed)
Relevant patient education mailed to patient.  

## 2014-01-29 ENCOUNTER — Telehealth: Payer: Self-pay | Admitting: Family Medicine

## 2014-01-29 NOTE — Telephone Encounter (Signed)
Relevant patient education mailed to patient.  

## 2014-02-27 ENCOUNTER — Ambulatory Visit: Payer: Medicare Other | Admitting: Family Medicine

## 2014-03-25 LAB — HM MAMMOGRAPHY

## 2014-03-26 ENCOUNTER — Encounter: Payer: Self-pay | Admitting: Family Medicine

## 2014-04-16 ENCOUNTER — Encounter: Payer: Self-pay | Admitting: Family Medicine

## 2014-04-30 ENCOUNTER — Encounter: Payer: Self-pay | Admitting: Family

## 2014-04-30 ENCOUNTER — Ambulatory Visit (INDEPENDENT_AMBULATORY_CARE_PROVIDER_SITE_OTHER): Payer: Medicare Other | Admitting: Family

## 2014-04-30 VITALS — BP 110/70 | HR 85 | Temp 97.8°F | Resp 16 | Ht 63.0 in | Wt 217.1 lb

## 2014-04-30 DIAGNOSIS — N390 Urinary tract infection, site not specified: Secondary | ICD-10-CM

## 2014-04-30 DIAGNOSIS — B3731 Acute candidiasis of vulva and vagina: Secondary | ICD-10-CM | POA: Insufficient documentation

## 2014-04-30 DIAGNOSIS — B373 Candidiasis of vulva and vagina: Secondary | ICD-10-CM

## 2014-04-30 LAB — POCT URINALYSIS DIPSTICK
Glucose, UA: NEGATIVE
Ketones, UA: NEGATIVE
Nitrite, UA: NEGATIVE
PH UA: 6
Protein, UA: 30
Urobilinogen, UA: 0.2

## 2014-04-30 MED ORDER — NYSTATIN 100000 UNIT/GM EX POWD: Freq: Two times a day (BID) | CUTANEOUS | Status: DC

## 2014-04-30 MED ORDER — CIPROFLOXACIN HCL 250 MG PO TABS: 250.0000 mg | ORAL_TABLET | Freq: Two times a day (BID) | ORAL | Status: DC

## 2014-04-30 MED ORDER — FLUCONAZOLE 150 MG PO TABS: ORAL_TABLET | ORAL | Status: DC

## 2014-04-30 NOTE — Patient Instructions (Addendum)
Hold Mevacor x 1 week, then restart. Start diflucan and cipro today. You can apply nystatin powder to the external vulvar area twice daily. Call if symptoms worsen, or if not improved in 3 days.  Follow up with Dr. Charlett Blake in 1 month.

## 2014-04-30 NOTE — Progress Notes (Signed)
Subjective:    Patient ID: Stacey Cooper, female    DOB: 05-11-1935, 79 y.o.   MRN: [id number]  HPI  Stacey Cooper is a 79 yr old female who presents today with chief complaint of dysuria. Notes that her external vulvar area is very red and irritated. Pain is worse with urination. Feels like she is not completely emptying her bladder. Feels like these symptoms started after her janumet was increased.   Review of Systems See HPI  Past Medical History  Diagnosis Date  . Diabetes mellitus   . UTI'S, HX OF 01/24/2011  . URINARY INCONTINENCE 02/07/2011  . SQUAMOUS CELL CARCINOMA OTHER SPEC SITES SKIN 01/24/2011  . NONSPEC ELEVATION OF LEVELS OF TRANSAMINASE/LDH 02/07/2011  . Morbid obesity 01/24/2011  . MEASLES, HX OF 01/24/2011  . HAIR LOSS 01/24/2011  . GERD 01/24/2011  . Essential hypertension, benign 01/24/2011  . DM 01/24/2011  . CHOLELITHIASIS, HX OF 02/07/2011  . Basal cell carcinoma of skin of lip 01/24/2011  . ABDOMINAL PAIN, RIGHT UPPER QUADRANT, HX OF 01/24/2011  . Peripheral neuropathy 04/20/2011  . SCC (squamous cell carcinoma), face 07/18/2011  . Actinic keratoses 07/18/2011  . Back pain 12/08/2011  . Hyperlipidemia 04/24/2012  . Insomnia 07/24/2012  . Bronchitis 12/20/2012  . Leg cramps 06/06/2013  . Hearing loss of both ears 06/28/2013  . Grief reaction 01/20/2014    History   Social History  . Marital Status: Married    Spouse Name: N/A    Number of Children: N/A  . Years of Education: N/A   Occupational History  . Not on file.   Social History Main Topics  . Smoking status: Never Smoker   . Smokeless tobacco: Never Used  . Alcohol Use: No  . Drug Use: No  . Sexual Activity: No   Other Topics Concern  . Not on file   Social History Narrative  . No narrative on file    Past Surgical History  Procedure Laterality Date  . Abdominal hysterectomy      partial, both ovaries left in place  . Total knee replacement, b/l    . Cataract extraction, b/l    . Skin bx,  right anterior cw , squamous cancer, had to redo to margins  12/2010  . Bcc removed below lower lip on left  26 yrs ago  . Left knee arthroscopy and cleaning prior to tkr      Family History  Problem Relation Age of Onset  . Arthritis Mother     RA  . Myelodysplastic syndrome Mother   . Cancer Father     skin  . Stroke Father   . Stroke Maternal Grandmother   . Kidney failure Maternal Grandfather     Allergies  Allergen Reactions  . Metformin     REACTION: nausea, vomit, diarrhea    Current Outpatient Prescriptions on File Prior to Visit  Medication Sig Dispense Refill  . Lancets MISC by Does not apply route. Check blood sugars qam and once daily prn       . lisinopril (PRINIVIL,ZESTRIL) 5 MG tablet Take 1 tablet (5 mg total) by mouth daily.  90 tablet  3  . lovastatin (MEVACOR) 10 MG tablet Take 1 tablet (10 mg total) by mouth at bedtime.  90 tablet  3  . ranitidine (ZANTAC) 300 MG tablet Take 1 tablet (300 mg total) by mouth daily.  90 tablet  3  . sitaGLIPtin-metformin (JANUMET) 50-1000 MG per tablet Take 1 tablet by mouth 2 (  two) times daily with a meal.  60 tablet  3   No current facility-administered medications on file prior to visit.    BP 110/70  Pulse 85  Temp(Src) 97.8 F (36.6 C) (Oral)  Resp 16  Ht 5\' 3"  (1.6 m)  Wt 217 lb 1.9 oz (98.485 kg)  BMI 38.47 kg/m2  SpO2 97%       Objective:   Physical Exam  Constitutional: She is oriented to person, place, and time. She appears well-developed and well-nourished. No distress.  Cardiovascular: Normal rate and regular rhythm.   No murmur heard. Pulmonary/Chest: Effort normal and breath sounds normal. No respiratory distress. She has no wheezes. She has no rales. She exhibits no tenderness.  Abdominal: Soft. Bowel sounds are normal. She exhibits no distension and no mass. There is no tenderness. There is no rebound and no guarding.  Genitourinary:  + erythema to bilateral vulva  Neurological: She is alert and  oriented to person, place, and time.  Psychiatric: She has a normal mood and affect. Her behavior is normal. Judgment and thought content normal.          Assessment & Plan:  Microscopic hematuria noted on UA- pt will need follow up UA with micro at her follow up apt in 1 month to ensure resolution of hematura.

## 2014-04-30 NOTE — Assessment & Plan Note (Signed)
UA notes small leuks and mod blood. Will rx with cipro.

## 2014-04-30 NOTE — Assessment & Plan Note (Signed)
Will rx with diflucan.  Topical nystatin for external vulva.

## 2014-04-30 NOTE — Progress Notes (Signed)
Pre visit review using our clinic review tool, if applicable. No additional management support is needed unless otherwise documented below in the visit note. 

## 2014-05-01 LAB — URINE CULTURE: Colony Count: 55000

## 2014-05-16 ENCOUNTER — Telehealth: Payer: Self-pay

## 2014-05-16 DIAGNOSIS — N390 Urinary tract infection, site not specified: Secondary | ICD-10-CM

## 2014-05-16 MED ORDER — AMOXICILLIN 500 MG PO CAPS: 500.0000 mg | ORAL_CAPSULE | Freq: Two times a day (BID) | ORAL | Status: DC

## 2014-05-16 NOTE — Telephone Encounter (Signed)
Notified pt and she voices understanding. States that she has vulvar itching, swelling and burning like she did before. She will return to the lab this afternoon. Orders entered.

## 2014-05-16 NOTE — Telephone Encounter (Signed)
We should reculture her urine. Please ask her to come to the lab for UA and culture today before starting amoxicillin which I have sent to her pharmacy.   Is she having vulvar itching/swelling as well or just burning with urination? Call if symptoms worsen or if symptoms do not improve.

## 2014-05-16 NOTE — Telephone Encounter (Signed)
Pt called today stating that she finished her antibiotics for her UTI but and her symptoms went away. She is now experiencing the same symptoms as before and would like another antibiotic called in. Please advise.

## 2014-05-17 LAB — URINALYSIS, ROUTINE W REFLEX MICROSCOPIC
Bilirubin Urine: NEGATIVE
Hgb urine dipstick: NEGATIVE
Ketones, ur: NEGATIVE mg/dL
Leukocytes, UA: NEGATIVE
Nitrite: NEGATIVE
Protein, ur: NEGATIVE mg/dL
Specific Gravity, Urine: >1.03 — ABNORMAL HIGH (ref 1.005–1.030)
Urobilinogen, UA: 0.2 mg/dL (ref 0.0–1.0)
pH: 5 (ref 5.0–8.0)

## 2014-05-17 LAB — URINALYSIS, MICROSCOPIC ONLY
BACTERIA UA: NONE SEEN
CRYSTALS: NONE SEEN
Casts: NONE SEEN

## 2014-05-18 LAB — URINE CULTURE

## 2014-06-24 ENCOUNTER — Encounter: Payer: Self-pay | Admitting: Family Medicine

## 2014-06-24 ENCOUNTER — Ambulatory Visit (INDEPENDENT_AMBULATORY_CARE_PROVIDER_SITE_OTHER): Payer: Medicare Other | Admitting: Family Medicine

## 2014-06-24 VITALS — BP 130/68 | HR 88 | Temp 97.7°F | Ht 63.0 in | Wt 210.0 lb

## 2014-06-24 DIAGNOSIS — K219 Gastro-esophageal reflux disease without esophagitis: Secondary | ICD-10-CM

## 2014-06-24 DIAGNOSIS — N952 Postmenopausal atrophic vaginitis: Secondary | ICD-10-CM

## 2014-06-24 DIAGNOSIS — L309 Dermatitis, unspecified: Secondary | ICD-10-CM

## 2014-06-24 DIAGNOSIS — E785 Hyperlipidemia, unspecified: Secondary | ICD-10-CM

## 2014-06-24 DIAGNOSIS — B3731 Acute candidiasis of vulva and vagina: Secondary | ICD-10-CM

## 2014-06-24 DIAGNOSIS — E1159 Type 2 diabetes mellitus with other circulatory complications: Secondary | ICD-10-CM

## 2014-06-24 DIAGNOSIS — L259 Unspecified contact dermatitis, unspecified cause: Secondary | ICD-10-CM

## 2014-06-24 DIAGNOSIS — N390 Urinary tract infection, site not specified: Secondary | ICD-10-CM

## 2014-06-24 DIAGNOSIS — E119 Type 2 diabetes mellitus without complications: Secondary | ICD-10-CM

## 2014-06-24 DIAGNOSIS — F32A Depression, unspecified: Secondary | ICD-10-CM

## 2014-06-24 DIAGNOSIS — I10 Essential (primary) hypertension: Secondary | ICD-10-CM

## 2014-06-24 DIAGNOSIS — F329 Major depressive disorder, single episode, unspecified: Secondary | ICD-10-CM

## 2014-06-24 DIAGNOSIS — B373 Candidiasis of vulva and vagina: Secondary | ICD-10-CM

## 2014-06-24 DIAGNOSIS — F3289 Other specified depressive episodes: Secondary | ICD-10-CM

## 2014-06-24 LAB — HEMOGLOBIN A1C
Hgb A1c MFr Bld: 14 % — ABNORMAL HIGH (ref <5.7)
MEAN PLASMA GLUCOSE: 355 mg/dL — AB (ref <117)

## 2014-06-24 LAB — HEPATIC FUNCTION PANEL
ALBUMIN: 3.8 g/dL (ref 3.5–5.2)
ALT: 24 U/L (ref 0–35)
AST: 37 U/L (ref 0–37)
Alkaline Phosphatase: 187 U/L — ABNORMAL HIGH (ref 39–117)
Bilirubin, Direct: 0.3 mg/dL (ref 0.0–0.3)
Indirect Bilirubin: 0.9 mg/dL (ref 0.2–1.2)
Total Bilirubin: 1.2 mg/dL (ref 0.2–1.2)
Total Protein: 6.5 g/dL (ref 6.0–8.3)

## 2014-06-24 LAB — CBC
HEMATOCRIT: 43.4 % (ref 36.0–46.0)
HEMOGLOBIN: 15 g/dL (ref 12.0–15.0)
MCH: 31.4 pg (ref 26.0–34.0)
MCHC: 34.6 g/dL (ref 30.0–36.0)
MCV: 91 fL (ref 78.0–100.0)
Platelets: 172 10*3/uL (ref 150–400)
RBC: 4.77 MIL/uL (ref 3.87–5.11)
RDW: 13.1 % (ref 11.5–15.5)
WBC: 8 10*3/uL (ref 4.0–10.5)

## 2014-06-24 LAB — LIPID PANEL
Cholesterol: 196 mg/dL (ref 0–200)
HDL: 41 mg/dL (ref >39)
LDL Cholesterol: 125 mg/dL — ABNORMAL HIGH (ref 0–99)
TRIGLYCERIDES: 149 mg/dL (ref <150)
Total CHOL/HDL Ratio: 4.8 Ratio
VLDL: 30 mg/dL (ref 0–40)

## 2014-06-24 LAB — RENAL FUNCTION PANEL
Albumin: 3.8 g/dL (ref 3.5–5.2)
BUN: 8 mg/dL (ref 6–23)
CO2: 28 meq/L (ref 19–32)
Calcium: 9.2 mg/dL (ref 8.4–10.5)
Chloride: 98 mEq/L (ref 96–112)
Creat: 0.71 mg/dL (ref 0.50–1.10)
GLUCOSE: 380 mg/dL — AB (ref 70–99)
POTASSIUM: 4.1 meq/L (ref 3.5–5.3)
Phosphorus: 3.5 mg/dL (ref 2.3–4.6)
Sodium: 133 mEq/L — ABNORMAL LOW (ref 135–145)

## 2014-06-24 MED ORDER — GLUCOSE BLOOD VI STRP: ORAL_STRIP | Status: DC

## 2014-06-24 MED ORDER — CITALOPRAM HYDROBROMIDE 10 MG PO TABS: 10.0000 mg | ORAL_TABLET | Freq: Every day | ORAL | Status: DC

## 2014-06-24 MED ORDER — NYSTATIN-TRIAMCINOLONE 100000-0.1 UNIT/GM-% EX OINT: 1.0000 "application " | TOPICAL_OINTMENT | Freq: Two times a day (BID) | CUTANEOUS | Status: DC

## 2014-06-24 MED ORDER — PEN NEEDLES 31G X 6 MM MISC: Status: DC

## 2014-06-24 MED ORDER — METFORMIN HCL ER (OSM) 1000 MG PO TB24: 1000.0000 mg | ORAL_TABLET | Freq: Every day | ORAL | Status: DC

## 2014-06-24 MED ORDER — METFORMIN HCL ER (MOD) 500 MG PO TB24: 1000.0000 mg | ORAL_TABLET | Freq: Every day | ORAL | Status: DC

## 2014-06-24 MED ORDER — LOVASTATIN 10 MG PO TABS: 10.0000 mg | ORAL_TABLET | Freq: Every day | ORAL | Status: DC

## 2014-06-24 MED ORDER — LIRAGLUTIDE 18 MG/3ML ~~LOC~~ SOPN: 1.2000 mg | PEN_INJECTOR | Freq: Every day | SUBCUTANEOUS | Status: DC

## 2014-06-24 MED ORDER — FLUCONAZOLE 150 MG PO TABS: ORAL_TABLET | ORAL | Status: DC

## 2014-06-24 NOTE — Progress Notes (Signed)
Pre visit review using our clinic review tool, if applicable. No additional management support is needed unless otherwise documented below in the visit note. 

## 2014-06-24 NOTE — Patient Instructions (Signed)
Hold Lovastatin the day you take the Diflucan Start Victoza 0.6 mg daily x 1 week then increase to 1.2 mg   Basic Carbohydrate Counting for Diabetes Mellitus Carbohydrate counting is a method for keeping track of the amount of carbohydrates you eat. Eating carbohydrates naturally increases the level of sugar (glucose) in your blood, so it is important for you to know the amount that is okay for you to have in every meal. Carbohydrate counting helps keep the level of glucose in your blood within normal limits. The amount of carbohydrates allowed is different for every person. A dietitian can help you calculate the amount that is right for you. Once you know the amount of carbohydrates you can have, you can count the carbohydrates in the foods you want to eat. Carbohydrates are found in the following foods:  Grains, such as breads and cereals.  Dried beans and soy products.  Starchy vegetables, such as potatoes, peas, and corn.  Fruit and fruit juices.  Milk and yogurt.  Sweets and snack foods, such as cake, cookies, candy, chips, soft drinks, and fruit drinks. CARBOHYDRATE COUNTING There are two ways to count the carbohydrates in your food. You can use either of the methods or a combination of both. Reading the "Nutrition Facts" on Jane The "Nutrition Facts" is an area that is included on the labels of almost all packaged food and beverages in the [place]. It includes the serving size of that food or beverage and information about the nutrients in each serving of the food, including the grams (g) of carbohydrate per serving.  Decide the number of servings of this food or beverage that you will be able to eat or drink. Multiply that number of servings by the number of grams of carbohydrate that is listed on the label for that serving. The total will be the amount of carbohydrates you will be having when you eat or drink this food or beverage. Learning Standard Serving Sizes of  Food When you eat food that is not packaged or does not include "Nutrition Facts" on the label, you need to measure the servings in order to count the amount of carbohydrates.A serving of most carbohydrate-rich foods contains about 15 g of carbohydrates. The following list includes serving sizes of carbohydrate-rich foods that provide 15 g ofcarbohydrate per serving:   1 slice of bread (1 oz) or 1 six-inch tortilla.    of a hamburger bun or English muffin.  4-6 crackers.   cup unsweetened dry cereal.    cup hot cereal.   cup rice or pasta.    cup mashed potatoes or  of a large baked potato.  1 cup fresh fruit or one small piece of fruit.    cup canned or frozen fruit or fruit juice.  1 cup milk.   cup plain fat-free yogurt or yogurt sweetened with artificial sweeteners.   cup cooked dried beans or starchy vegetable, such as peas, corn, or potatoes.  Decide the number of standard-size servings that you will eat. Multiply that number of servings by 15 (the grams of carbohydrates in that serving). For example, if you eat 2 cups of strawberries, you will have eaten 2 servings and 30 g of carbohydrates (2 servings x 15 g = 30 g). For foods such as soups and casseroles, in which more than one food is mixed in, you will need to count the carbohydrates in each food that is included. EXAMPLE OF CARBOHYDRATE COUNTING Sample Dinner  3 oz  chicken breast.   cup of brown rice.   cup of corn.  1 cup milk.   1 cup strawberries with sugar-free whipped topping.  Carbohydrate Calculation Step 1: Identify the foods that contain carbohydrates:   Rice.   Corn.   Milk.   Strawberries. Step 2:Calculate the number of servings eaten of each:   2 servings of rice.   1 serving of corn.   1 serving of milk.   1 serving of strawberries. Step 3: Multiply each of those number of servings by 15 g:   2 servings of rice x 15 g = 30 g.   1 serving of corn x 15 g  = 15 g.   1 serving of milk x 15 g = 15 g.   1 serving of strawberries x 15 g = 15 g. Step 4: Add together all of the amounts to find the total grams of carbohydrates eaten: 30 g + 15 g + 15 g + 15 g = 75 g. Document Released: 12/12/2005 Document Revised: 12/17/2013 Document Reviewed: 11/08/2013 [facility] Patient Information 2015 [place], [place]. This information is not intended to replace advice given to you by your health care Emanie Behan. Make sure you discuss any questions you have with your health care Deetra Booton.

## 2014-06-25 ENCOUNTER — Encounter: Payer: Self-pay | Admitting: Family Medicine

## 2014-06-25 LAB — URINALYSIS
Glucose, UA: >1000 mg/dL — AB
Hgb urine dipstick: NEGATIVE
LEUKOCYTES UA: NEGATIVE
NITRITE: NEGATIVE
Protein, ur: NEGATIVE mg/dL
Urobilinogen, UA: 1 mg/dL (ref 0.0–1.0)
pH: 5 (ref 5.0–8.0)

## 2014-06-25 LAB — URINE CULTURE
COLONY COUNT: NO GROWTH
Organism ID, Bacteria: NO GROWTH

## 2014-06-26 ENCOUNTER — Telehealth: Payer: Self-pay | Admitting: Family Medicine

## 2014-06-26 NOTE — Telephone Encounter (Signed)
Received medical records from [facility]

## 2014-06-28 ENCOUNTER — Encounter: Payer: Self-pay | Admitting: Family Medicine

## 2014-06-28 DIAGNOSIS — L309 Dermatitis, unspecified: Secondary | ICD-10-CM | POA: Insufficient documentation

## 2014-06-28 NOTE — Progress Notes (Signed)
Patient ID: Stacey Cooper, female   DOB: 03/05/1935, 79 y.o.   MRN: [id number] CHARLETT MERKLE [id number] 06-27-1935 06/28/2014      Progress Note-Follow Up  Subjective  Chief Complaint  Chief Complaint  Patient presents with  . Follow-up    1 month    HPI  Patient is a 79 year old female in today for routine medical care. In today with numerous concerns. Noting urinary frequency, urgency, some vaginal irritation noted. No pain or hematuria. No fevers, chills. No myalgias or malaise. Is struggling with anhedonia still s/p her husband's death but denies suicidal ideation and is managing adequately. Denies CP/palp/SOB/HA/congestion/fevers/GI or GU c/o. Taking meds as prescribed. Notes blood sugars have been ranging from 150 to 180  Past Medical History  Diagnosis Date  . Diabetes mellitus   . UTI'S, HX OF 01/24/2011  . URINARY INCONTINENCE 02/07/2011  . SQUAMOUS CELL CARCINOMA OTHER SPEC SITES SKIN 01/24/2011  . NONSPEC ELEVATION OF LEVELS OF TRANSAMINASE/LDH 02/07/2011  . Morbid obesity 01/24/2011  . MEASLES, HX OF 01/24/2011  . HAIR LOSS 01/24/2011  . GERD 01/24/2011  . Essential hypertension, benign 01/24/2011  . DM 01/24/2011  . CHOLELITHIASIS, HX OF 02/07/2011  . Basal cell carcinoma of skin of lip 01/24/2011  . ABDOMINAL PAIN, RIGHT UPPER QUADRANT, HX OF 01/24/2011  . Peripheral neuropathy 04/20/2011  . SCC (squamous cell carcinoma), face 07/18/2011  . Actinic keratoses 07/18/2011  . Back pain 12/08/2011  . Hyperlipidemia 04/24/2012  . Insomnia 07/24/2012  . Bronchitis 12/20/2012  . Leg cramps 06/06/2013  . Hearing loss of both ears 06/28/2013  . Grief reaction 01/20/2014  . Urinary tract infection, site not specified 04/30/2014    Past Surgical History  Procedure Laterality Date  . Abdominal hysterectomy      partial, both ovaries left in place  . Total knee replacement, b/l    . Cataract extraction, b/l    . Skin bx, right anterior cw , squamous cancer, had to redo to margins   12/2010  . Bcc removed below lower lip on left  26 yrs ago  . Left knee arthroscopy and cleaning prior to tkr      Family History  Problem Relation Age of Onset  . Arthritis Mother     RA  . Myelodysplastic syndrome Mother   . Cancer Father     skin  . Stroke Father   . Stroke Maternal Grandmother   . Kidney failure Maternal Grandfather     History   Social History  . Marital Status: Married    Spouse Name: N/A    Number of Children: N/A  . Years of Education: N/A   Occupational History  . Not on file.   Social History Main Topics  . Smoking status: Never Smoker   . Smokeless tobacco: Never Used  . Alcohol Use: No  . Drug Use: No  . Sexual Activity: No   Other Topics Concern  . Not on file   Social History Narrative  . No narrative on file    Current Outpatient Prescriptions on File Prior to Visit  Medication Sig Dispense Refill  . Lancets MISC by Does not apply route. Check blood sugars qam and once daily prn       . lisinopril (PRINIVIL,ZESTRIL) 5 MG tablet Take 1 tablet (5 mg total) by mouth daily.  90 tablet  3  . nystatin (MYCOSTATIN) powder Apply topically 2 (two) times daily.  30 g  0   No current facility-administered  medications on file prior to visit.    Allergies  Allergen Reactions  . Metformin     REACTION: nausea, vomit, diarrhea    Review of Systems  Review of Systems  Constitutional: Negative for fever and malaise/fatigue.  HENT: Negative for congestion.   Eyes: Negative for discharge.  Respiratory: Negative for shortness of breath.   Cardiovascular: Negative for chest pain, palpitations and leg swelling.  Gastrointestinal: Positive for heartburn. Negative for nausea, abdominal pain and diarrhea.  Genitourinary: Positive for frequency. Negative for dysuria.  Musculoskeletal: Negative for falls.  Skin: Negative for rash.  Neurological: Negative for loss of consciousness and headaches.  Endo/Heme/Allergies: Negative for polydipsia.   Psychiatric/Behavioral: Negative for depression and suicidal ideas. The patient is not nervous/anxious and does not have insomnia.     Objective  BP 130/68  Pulse 88  Temp(Src) 97.7 F (36.5 C) (Oral)  Ht 5\' 3"  (1.6 m)  Wt 210 lb (95.255 kg)  BMI 37.21 kg/m2  SpO2 94%  Physical Exam  Physical Exam  Constitutional: She is oriented to person, place, and time and well-developed, well-nourished, and in no distress. No distress.  HENT:  Head: Normocephalic and atraumatic.  Eyes: Conjunctivae are normal.  Neck: Neck supple. No thyromegaly present.  Cardiovascular: Normal rate, regular rhythm and normal heart sounds.   No murmur heard. Pulmonary/Chest: Effort normal and breath sounds normal. She has no wheezes.  Abdominal: She exhibits no distension and no mass.  Musculoskeletal: She exhibits no edema.  Lymphadenopathy:    She has no cervical adenopathy.  Neurological: She is alert and oriented to person, place, and time.  Skin: Skin is warm and dry. No rash noted. She is not diaphoretic.  Psychiatric: Memory, affect and judgment normal.    Lab Results  Component Value Date   TSH 3.150 01/09/2014   Lab Results  Component Value Date   WBC 8.0 06/24/2014   HGB 15.0 06/24/2014   HCT 43.4 06/24/2014   MCV 91.0 06/24/2014   PLT 172 06/24/2014   Lab Results  Component Value Date   CREATININE 0.71 06/24/2014   BUN 8 06/24/2014   NA 133* 06/24/2014   K 4.1 06/24/2014   CL 98 06/24/2014   CO2 28 06/24/2014   Lab Results  Component Value Date   ALT 24 06/24/2014   AST 37 06/24/2014   ALKPHOS 187* 06/24/2014   BILITOT 1.2 06/24/2014   Lab Results  Component Value Date   CHOL 196 06/24/2014   Lab Results  Component Value Date   HDL 41 06/24/2014   Lab Results  Component Value Date   LDLCALC 125* 06/24/2014   Lab Results  Component Value Date   TRIG 149 06/24/2014   Lab Results  Component Value Date   CHOLHDL 4.8 06/24/2014     Assessment & Plan  ESSENTIAL HYPERTENSION,  BENIGN Well controlled, no changes to meds. Encouraged heart healthy diet such as the DASH diet and exercise as tolerated.   GERD Avoid offending foods, start probiotics. Do not eat large meals in late evening and consider raising head of bed.   MORBID OBESITY Encouraged DASH diet, decrease po intake and increase exercise as tolerated. Needs 7-8 hours of sleep nightly. Avoid trans fats, eat small, frequent meals every 4-5 hours with lean proteins, complex carbs and healthy fats. Minimize simple carbs, GMO foods.  Dermatitis In  Groin, keep skin clean and dry. Try Mycolog rx  DM Poor control minimize carbs and start Victoza at 0.6  daily x  2 weeks then increase to 1.2   Urinary tract infection, site not specified UA clear, encouraged probiotics

## 2014-06-28 NOTE — Assessment & Plan Note (Signed)
In  Groin, keep skin clean and dry. Try Mycolog rx

## 2014-06-28 NOTE — Assessment & Plan Note (Signed)
UA clear, encouraged probiotics

## 2014-06-28 NOTE — Assessment & Plan Note (Signed)
Avoid offending foods, start probiotics. Do not eat large meals in late evening and consider raising head of bed.  

## 2014-06-28 NOTE — Assessment & Plan Note (Signed)
Encouraged DASH diet, decrease po intake and increase exercise as tolerated. Needs 7-8 hours of sleep nightly. Avoid trans fats, eat small, frequent meals every 4-5 hours with lean proteins, complex carbs and healthy fats. Minimize simple carbs, GMO foods. 

## 2014-06-28 NOTE — Assessment & Plan Note (Signed)
Well controlled, no changes to meds. Encouraged heart healthy diet such as the DASH diet and exercise as tolerated.  °

## 2014-06-28 NOTE — Assessment & Plan Note (Signed)
Poor control minimize carbs and start Victoza at 0.6  daily x 2 weeks then increase to 1.2

## 2014-07-08 ENCOUNTER — Telehealth: Payer: Self-pay | Admitting: Family Medicine

## 2014-07-08 MED ORDER — PEN NEEDLES 31G X 6 MM MISC: Status: DC

## 2014-07-08 NOTE — Telephone Encounter (Signed)
[facility] left message requesting script for Ultra fine pin needles for patients victoza, patient has used samples in the past

## 2014-07-08 NOTE — Telephone Encounter (Signed)
Refill sent.

## 2014-08-25 ENCOUNTER — Other Ambulatory Visit: Payer: Self-pay | Admitting: Family Medicine

## 2014-08-28 ENCOUNTER — Ambulatory Visit (INDEPENDENT_AMBULATORY_CARE_PROVIDER_SITE_OTHER): Payer: Medicare Other | Admitting: Family Medicine

## 2014-08-28 ENCOUNTER — Encounter: Payer: Self-pay | Admitting: Family Medicine

## 2014-08-28 VITALS — BP 102/64 | HR 58 | Temp 97.6°F | Ht 63.0 in | Wt 206.0 lb

## 2014-08-28 DIAGNOSIS — E785 Hyperlipidemia, unspecified: Secondary | ICD-10-CM

## 2014-08-28 DIAGNOSIS — Z23 Encounter for immunization: Secondary | ICD-10-CM

## 2014-08-28 DIAGNOSIS — I10 Essential (primary) hypertension: Secondary | ICD-10-CM

## 2014-08-28 DIAGNOSIS — E119 Type 2 diabetes mellitus without complications: Secondary | ICD-10-CM

## 2014-08-28 MED ORDER — PNEUMOCOCCAL 13-VAL CONJ VACC IM SUSP: 0.5000 mL | Freq: Once | INTRAMUSCULAR | Status: DC

## 2014-08-28 NOTE — Patient Instructions (Signed)

## 2014-08-28 NOTE — Progress Notes (Signed)
Patient ID: Stacey Cooper, female   DOB: 19-Feb-1935, 79 y.o.   MRN: [id number] Stacey Cooper [id number] 11/29/1935 08/28/2014      Progress Note-Follow Up  Subjective  Chief Complaint  Chief Complaint  Patient presents with  . Follow-up    2 month  . Injections    prevnar and flu    HPI  Patient is a 79 year old female in today for routine medical care.Doing much better, fatigue has lifted as sugar has improved. She reports home sugars of 136-167. No polyuria or polydipsia, vision is better. Did have a tick bite a week and a 1/2 ago but got it off before it had been on 24 hours. No fevers, HA or concerns. Denies CP/palp/SOB/HA/congestion/fevers/GI or GU c/o. Taking meds as prescribed   Past Medical History  Diagnosis Date  . Diabetes mellitus   . UTI'S, HX OF 01/24/2011  . URINARY INCONTINENCE 02/07/2011  . SQUAMOUS CELL CARCINOMA OTHER SPEC SITES SKIN 01/24/2011  . NONSPEC ELEVATION OF LEVELS OF TRANSAMINASE/LDH 02/07/2011  . Morbid obesity 01/24/2011  . MEASLES, HX OF 01/24/2011  . HAIR LOSS 01/24/2011  . GERD 01/24/2011  . Essential hypertension, benign 01/24/2011  . DM 01/24/2011  . CHOLELITHIASIS, HX OF 02/07/2011  . Basal cell carcinoma of skin of lip 01/24/2011  . ABDOMINAL PAIN, RIGHT UPPER QUADRANT, HX OF 01/24/2011  . Peripheral neuropathy 04/20/2011  . SCC (squamous cell carcinoma), face 07/18/2011  . Actinic keratoses 07/18/2011  . Back pain 12/08/2011  . Hyperlipidemia 04/24/2012  . Insomnia 07/24/2012  . Bronchitis 12/20/2012  . Leg cramps 06/06/2013  . Hearing loss of both ears 06/28/2013  . Grief reaction 01/20/2014  . Urinary tract infection, site not specified 04/30/2014    Past Surgical History  Procedure Laterality Date  . Abdominal hysterectomy      partial, both ovaries left in place  . Total knee replacement, b/l    . Cataract extraction, b/l    . Skin bx, right anterior cw , squamous cancer, had to redo to margins  12/2010  . Bcc removed below lower lip on left   26 yrs ago  . Left knee arthroscopy and cleaning prior to tkr      Family History  Problem Relation Age of Onset  . Arthritis Mother     RA  . Myelodysplastic syndrome Mother   . Cancer Father     skin  . Stroke Father   . Stroke Maternal Grandmother   . Kidney failure Maternal Grandfather     History   Social History  . Marital Status: Married    Spouse Name: N/A    Number of Children: N/A  . Years of Education: N/A   Occupational History  . Not on file.   Social History Main Topics  . Smoking status: Never Smoker   . Smokeless tobacco: Never Used  . Alcohol Use: No  . Drug Use: No  . Sexual Activity: No   Other Topics Concern  . Not on file   Social History Narrative  . No narrative on file    Current Outpatient Prescriptions on File Prior to Visit  Medication Sig Dispense Refill  . citalopram (CELEXA) 10 MG tablet Take 1 tablet (10 mg total) by mouth daily.  30 tablet  3  . glucose blood (FREESTYLE LITE) test strip Use as instructed  100 each  12  . Insulin Pen Needle (PEN NEEDLES) 31G X 6 MM MISC Use with victoza daily DX 250.00  100 each  1  . Lancets MISC by Does not apply route. Check blood sugars qam and once daily prn       . Liraglutide (VICTOZA) 18 MG/3ML SOPN Inject 1.2 mg into the skin daily.  9 mL  2  . lisinopril (PRINIVIL,ZESTRIL) 5 MG tablet Take 1 tablet (5 mg total) by mouth daily.  90 tablet  3  . lovastatin (MEVACOR) 10 MG tablet Take 1 tablet (10 mg total) by mouth at bedtime.  90 tablet  3  . metFORMIN (GLUCOPHAGE-XR) 500 MG 24 hr tablet TAKE 2 TABLETS (1,000 MG TOTAL) BY MOUTH DAILY WITH BREAKFAST.  60 tablet  4  . nystatin (MYCOSTATIN) powder Apply topically 2 (two) times daily.  30 g  0  . nystatin-triamcinolone ointment (MYCOLOG) Apply 1 application topically 2 (two) times daily.  60 g  1   No current facility-administered medications on file prior to visit.    Allergies  Allergen Reactions  . Metformin     REACTION: nausea,  vomit, diarrhea    Review of Systems  Review of Systems  Constitutional: Negative for fever and malaise/fatigue.  HENT: Negative for congestion.   Eyes: Negative for discharge.  Respiratory: Negative for shortness of breath.   Cardiovascular: Negative for chest pain, palpitations and leg swelling.  Gastrointestinal: Negative for nausea, abdominal pain and diarrhea.  Genitourinary: Negative for dysuria.  Musculoskeletal: Negative for falls.  Skin: Negative for rash.  Neurological: Negative for loss of consciousness and headaches.  Endo/Heme/Allergies: Negative for polydipsia.  Psychiatric/Behavioral: Negative for depression and suicidal ideas. The patient is not nervous/anxious and does not have insomnia.     Objective  BP 102/64  Pulse 58  Temp(Src) 97.6 F (36.4 C) (Oral)  Ht 5\' 3"  (1.6 m)  Wt 206 lb 0.6 oz (93.459 kg)  BMI 36.51 kg/m2  SpO2 98%  Physical Exam  Physical Exam  Constitutional: She is oriented to person, place, and time and well-developed, well-nourished, and in no distress. No distress.  HENT:  Head: Normocephalic and atraumatic.  Eyes: Conjunctivae are normal.  Neck: Neck supple. No thyromegaly present.  Cardiovascular: Normal rate, regular rhythm and normal heart sounds.   No murmur heard. Pulmonary/Chest: Effort normal and breath sounds normal. She has no wheezes.  Abdominal: She exhibits no distension and no mass.  Musculoskeletal: She exhibits no edema.  Lymphadenopathy:    She has no cervical adenopathy.  Neurological: She is alert and oriented to person, place, and time.  Skin: Skin is warm and dry. No rash noted. She is not diaphoretic.  Psychiatric: Memory, affect and judgment normal.    Lab Results  Component Value Date   TSH 3.150 01/09/2014   Lab Results  Component Value Date   WBC 8.0 06/24/2014   HGB 15.0 06/24/2014   HCT 43.4 06/24/2014   MCV 91.0 06/24/2014   PLT 172 06/24/2014   Lab Results  Component Value Date   CREATININE  0.71 06/24/2014   BUN 8 06/24/2014   NA 133* 06/24/2014   K 4.1 06/24/2014   CL 98 06/24/2014   CO2 28 06/24/2014   Lab Results  Component Value Date   ALT 24 06/24/2014   AST 37 06/24/2014   ALKPHOS 187* 06/24/2014   BILITOT 1.2 06/24/2014   Lab Results  Component Value Date   CHOL 196 06/24/2014   Lab Results  Component Value Date   HDL 41 06/24/2014   Lab Results  Component Value Date   LDLCALC 125* 06/24/2014  Lab Results  Component Value Date   TRIG 149 06/24/2014   Lab Results  Component Value Date   CHOLHDL 4.8 06/24/2014     Assessment & Plan  ESSENTIAL HYPERTENSION, BENIGN Well controlled, no changes to meds. Encouraged heart healthy diet such as the DASH diet and exercise as tolerated.   DM Very high A1C last draw but reports numbers 136 to 167 since then. Tolerating Victoza with changes in diet has given up Dr Malachi Bonds. Check A1C next month and then decide on Endocrinology referral, patient hesitant to proceed at this time. Check blood sugars twice daily and prn due to poor control  MORBID OBESITY Encouraged DASH diet, decrease po intake and increase exercise as tolerated. Needs 7-8 hours of sleep nightly. Avoid trans fats, eat small, frequent meals every 4-5 hours with lean proteins, complex carbs and healthy fats. Minimize simple carbs, GMO foods.  Hyperlipidemia Encouraged heart healthy diet, increase exercise, avoid trans fats, consider a krill oil cap daily

## 2014-08-28 NOTE — Progress Notes (Signed)
Pre visit review using our clinic review tool, if applicable. No additional management support is needed unless otherwise documented below in the visit note. 

## 2014-08-28 NOTE — Assessment & Plan Note (Signed)
Well controlled, no changes to meds. Encouraged heart healthy diet such as the DASH diet and exercise as tolerated.  °

## 2014-08-28 NOTE — Assessment & Plan Note (Addendum)
Very high A1C last draw but reports numbers 136 to 167 since then. Tolerating Victoza with changes in diet has given up Dr Malachi Bonds. Check A1C next month and then decide on Endocrinology referral, patient hesitant to proceed at this time. Check blood sugars twice daily and prn due to poor control

## 2014-08-28 NOTE — Assessment & Plan Note (Signed)
Encouraged DASH diet, decrease po intake and increase exercise as tolerated. Needs 7-8 hours of sleep nightly. Avoid trans fats, eat small, frequent meals every 4-5 hours with lean proteins, complex carbs and healthy fats. Minimize simple carbs, GMO foods. 

## 2014-08-28 NOTE — Assessment & Plan Note (Signed)
Encouraged heart healthy diet, increase exercise, avoid trans fats, consider a krill oil cap daily 

## 2014-10-09 ENCOUNTER — Other Ambulatory Visit (INDEPENDENT_AMBULATORY_CARE_PROVIDER_SITE_OTHER): Payer: Medicare Other

## 2014-10-09 DIAGNOSIS — Z0000 Encounter for general adult medical examination without abnormal findings: Secondary | ICD-10-CM

## 2014-10-09 DIAGNOSIS — E785 Hyperlipidemia, unspecified: Secondary | ICD-10-CM

## 2014-10-09 DIAGNOSIS — E782 Mixed hyperlipidemia: Secondary | ICD-10-CM

## 2014-10-09 LAB — CBC
HEMATOCRIT: 44.5 % (ref 36.0–46.0)
HEMOGLOBIN: 14.7 g/dL (ref 12.0–15.0)
MCHC: 33 g/dL (ref 30.0–36.0)
MCV: 94.1 fl (ref 78.0–100.0)
Platelets: 203 10*3/uL (ref 150.0–400.0)
RBC: 4.73 Mil/uL (ref 3.87–5.11)
RDW: 13.4 % (ref 11.5–15.5)
WBC: 7.7 10*3/uL (ref 4.0–10.5)

## 2014-10-09 LAB — RENAL FUNCTION PANEL
ALBUMIN: 3.4 g/dL — AB (ref 3.5–5.2)
BUN: 10 mg/dL (ref 6–23)
CALCIUM: 9.5 mg/dL (ref 8.4–10.5)
CO2: 22 meq/L (ref 19–32)
CREATININE: 0.8 mg/dL (ref 0.4–1.2)
Chloride: 105 mEq/L (ref 96–112)
GFR: 77.24 mL/min (ref >60.00)
Glucose, Bld: 154 mg/dL — ABNORMAL HIGH (ref 70–99)
Phosphorus: 3.3 mg/dL (ref 2.3–4.6)
Potassium: 3.4 mEq/L — ABNORMAL LOW (ref 3.5–5.1)
SODIUM: 140 meq/L (ref 135–145)

## 2014-10-09 LAB — LIPID PANEL
CHOLESTEROL: 186 mg/dL (ref 0–200)
HDL: 33.9 mg/dL — AB (ref >39.00)
LDL Cholesterol: 122 mg/dL — ABNORMAL HIGH (ref 0–99)
NonHDL: 152.1
Total CHOL/HDL Ratio: 5
Triglycerides: 153 mg/dL — ABNORMAL HIGH (ref 0.0–149.0)
VLDL: 30.6 mg/dL (ref 0.0–40.0)

## 2014-10-09 LAB — HEPATIC FUNCTION PANEL
ALT: 18 U/L (ref 0–35)
AST: 40 U/L — ABNORMAL HIGH (ref 0–37)
Albumin: 3.4 g/dL — ABNORMAL LOW (ref 3.5–5.2)
Alkaline Phosphatase: 137 U/L — ABNORMAL HIGH (ref 39–117)
Bilirubin, Direct: 0.1 mg/dL (ref 0.0–0.3)
TOTAL PROTEIN: 7.8 g/dL (ref 6.0–8.3)
Total Bilirubin: 0.8 mg/dL (ref 0.2–1.2)

## 2014-10-09 LAB — HEMOGLOBIN A1C: HEMOGLOBIN A1C: 8 % — AB (ref 4.6–6.5)

## 2014-10-09 LAB — TSH: TSH: 2.12 u[IU]/mL (ref 0.35–4.50)

## 2014-10-16 ENCOUNTER — Ambulatory Visit (INDEPENDENT_AMBULATORY_CARE_PROVIDER_SITE_OTHER): Payer: Medicare Other | Admitting: Family Medicine

## 2014-10-16 ENCOUNTER — Encounter: Payer: Self-pay | Admitting: Family Medicine

## 2014-10-16 VITALS — BP 117/55 | HR 75 | Temp 97.6°F | Ht 63.0 in | Wt 208.2 lb

## 2014-10-16 DIAGNOSIS — E876 Hypokalemia: Secondary | ICD-10-CM

## 2014-10-16 DIAGNOSIS — I10 Essential (primary) hypertension: Secondary | ICD-10-CM

## 2014-10-16 DIAGNOSIS — E119 Type 2 diabetes mellitus without complications: Secondary | ICD-10-CM

## 2014-10-16 DIAGNOSIS — E1169 Type 2 diabetes mellitus with other specified complication: Secondary | ICD-10-CM

## 2014-10-16 DIAGNOSIS — E785 Hyperlipidemia, unspecified: Secondary | ICD-10-CM

## 2014-10-16 DIAGNOSIS — E669 Obesity, unspecified: Principal | ICD-10-CM

## 2014-10-16 LAB — RENAL FUNCTION PANEL
ALBUMIN: 3.4 g/dL — AB (ref 3.5–5.2)
BUN: 12 mg/dL (ref 6–23)
CALCIUM: 9.6 mg/dL (ref 8.4–10.5)
CO2: 24 mEq/L (ref 19–32)
Chloride: 104 mEq/L (ref 96–112)
Creatinine, Ser: 0.8 mg/dL (ref 0.4–1.2)
GFR: 74.99 mL/min (ref >60.00)
GLUCOSE: 172 mg/dL — AB (ref 70–99)
PHOSPHORUS: 3.8 mg/dL (ref 2.3–4.6)
POTASSIUM: 4.2 meq/L (ref 3.5–5.1)
SODIUM: 141 meq/L (ref 135–145)

## 2014-10-16 MED ORDER — METFORMIN HCL ER 500 MG PO TB24: ORAL_TABLET | ORAL | Status: DC

## 2014-10-16 MED ORDER — LOVASTATIN 20 MG PO TABS: 20.0000 mg | ORAL_TABLET | Freq: Every day | ORAL | Status: DC

## 2014-10-16 NOTE — Progress Notes (Signed)
Pre visit review using our clinic review tool, if applicable. No additional management support is needed unless otherwise documented below in the visit note. 

## 2014-10-16 NOTE — Assessment & Plan Note (Signed)
hgba1c acceptable, minimize simple carbs. Increase exercise as tolerated. Continue current meds 

## 2014-10-16 NOTE — Patient Instructions (Signed)
Check with pharmacy about sugar meter, what is cheapest with your plan then let us know and we will send in prescription Increase Metformin to 2 tabs in am and 1 tab in pm Lovastatin is going to increase to 20 mg daily, so take two of the 10 mg tabs daily til gone then pick up the new prescription for the 20 mg tab and take one daily as directed  Salon Pas or Aspercreme for the knees  Try Tylenol ES 500 mg tabs 1 tab twice daily x 2 weeks and see if it helps   Basic Carbohydrate Counting for Diabetes Mellitus Carbohydrate counting is a method for keeping track of the amount of carbohydrates you eat. Eating carbohydrates naturally increases the level of sugar (glucose) in your blood, so it is important for you to know the amount that is okay for you to have in every meal. Carbohydrate counting helps keep the level of glucose in your blood within normal limits. The amount of carbohydrates allowed is different for every person. A dietitian can help you calculate the amount that is right for you. Once you know the amount of carbohydrates you can have, you can count the carbohydrates in the foods you want to eat. Carbohydrates are found in the following foods:  Grains, such as breads and cereals.  Dried beans and soy products.  Starchy vegetables, such as potatoes, peas, and corn.  Fruit and fruit juices.  Milk and yogurt.  Sweets and snack foods, such as cake, cookies, candy, chips, soft drinks, and fruit drinks. CARBOHYDRATE COUNTING There are two ways to count the carbohydrates in your food. You can use either of the methods or a combination of both. Reading the "Nutrition Facts" on [place] The "Nutrition Facts" is an area that is included on the labels of almost all packaged food and beverages in the [place]. It includes the serving size of that food or beverage and information about the nutrients in each serving of the food, including the grams (g) of carbohydrate per serving.   Decide the number of servings of this food or beverage that you will be able to eat or drink. Multiply that number of servings by the number of grams of carbohydrate that is listed on the label for that serving. The total will be the amount of carbohydrates you will be having when you eat or drink this food or beverage. Learning Standard Serving Sizes of Food When you eat food that is not packaged or does not include "Nutrition Facts" on the label, you need to measure the servings in order to count the amount of carbohydrates.A serving of most carbohydrate-rich foods contains about 15 g of carbohydrates. The following list includes serving sizes of carbohydrate-rich foods that provide 15 g ofcarbohydrate per serving:   1 slice of bread (1 oz) or 1 six-inch tortilla.    of a hamburger bun or English muffin.  4-6 crackers.   cup unsweetened dry cereal.    cup hot cereal.   cup rice or pasta.    cup mashed potatoes or  of a large baked potato.  1 cup fresh fruit or one small piece of fruit.    cup canned or frozen fruit or fruit juice.  1 cup milk.   cup plain fat-free yogurt or yogurt sweetened with artificial sweeteners.   cup cooked dried beans or starchy vegetable, such as peas, corn, or potatoes.  Decide the number of standard-size servings that you will eat. Multiply that number  of servings by 15 (the grams of carbohydrates in that serving). For example, if you eat 2 cups of strawberries, you will have eaten 2 servings and 30 g of carbohydrates (2 servings x 15 g = 30 g). For foods such as soups and casseroles, in which more than one food is mixed in, you will need to count the carbohydrates in each food that is included. EXAMPLE OF CARBOHYDRATE COUNTING Sample Dinner  3 oz chicken breast.   cup of brown rice.   cup of corn.  1 cup milk.   1 cup strawberries with sugar-free whipped topping.  Carbohydrate Calculation Step 1: Identify the foods that  contain carbohydrates:   Rice.   Corn.   Milk.   Strawberries. Step 2:Calculate the number of servings eaten of each:   2 servings of rice.   1 serving of corn.   1 serving of milk.   1 serving of strawberries. Step 3: Multiply each of those number of servings by 15 g:   2 servings of rice x 15 g = 30 g.   1 serving of corn x 15 g = 15 g.   1 serving of milk x 15 g = 15 g.   1 serving of strawberries x 15 g = 15 g. Step 4: Add together all of the amounts to find the total grams of carbohydrates eaten: 30 g + 15 g + 15 g + 15 g = 75 g. Document Released: 12/12/2005 Document Revised: 04/28/2014 Document Reviewed: 11/08/2013 [facility] Patient Information 2015 [place], [place]. This information is not intended to replace advice given to you by your health care provider. Make sure you discuss any questions you have with your health care provider.

## 2014-10-19 ENCOUNTER — Encounter: Payer: Self-pay | Admitting: Family Medicine

## 2014-10-19 DIAGNOSIS — E876 Hypokalemia: Secondary | ICD-10-CM | POA: Insufficient documentation

## 2014-10-19 NOTE — Progress Notes (Signed)
Patient ID: ESMIRNA RAVAN, female   DOB: July 11, 1935, 79 y.o.   MRN: [id number] RAISA DITTO [id number] [birth year] 10/19/2014      Progress Note-Follow Up  Subjective  Chief Complaint  Chief Complaint  Patient presents with  . Follow-up    75 weel    HPI  Patient is a 79 year old female in today for routine medical care. Here today reporting blood sugars ranging from 98 to 160s, lower in evening. No polyuria or polydipsia. Nor recent illness or new acute concerns. Trying to minimize simple carbs. Denies CP/palp/SOB/HA/congestion/fevers/GI or GU c/o. Taking meds as prescribed  Past Medical History  Diagnosis Date  . Diabetes mellitus   . UTI'S, HX OF 01/24/2011  . URINARY INCONTINENCE 02/07/2011  . SQUAMOUS CELL CARCINOMA OTHER SPEC SITES SKIN 01/24/2011  . NONSPEC ELEVATION OF LEVELS OF TRANSAMINASE/LDH 02/07/2011  . Morbid obesity 01/24/2011  . MEASLES, HX OF 01/24/2011  . HAIR LOSS 01/24/2011  . GERD 01/24/2011  . Essential hypertension, benign 01/24/2011  . DM 01/24/2011  . CHOLELITHIASIS, HX OF 02/07/2011  . Basal cell carcinoma of skin of lip 01/24/2011  . ABDOMINAL PAIN, RIGHT UPPER QUADRANT, HX OF 01/24/2011  . Peripheral neuropathy 04/20/2011  . SCC (squamous cell carcinoma), face 07/18/2011  . Actinic keratoses 07/18/2011  . Back pain 12/08/2011  . Hyperlipidemia 04/24/2012  . Insomnia 07/24/2012  . Bronchitis 12/20/2012  . Leg cramps 06/06/2013  . Hearing loss of both ears 06/28/2013  . Grief reaction 01/20/2014  . Urinary tract infection, site not specified 04/30/2014  . Diabetes mellitus type 2 in obese 01/24/2011    Qualifier: Diagnosis of  By: [name], [name]  Annual eye exam with Felicity Coyer on 06/14/13     Past Surgical History  Procedure Laterality Date  . Abdominal hysterectomy      partial, both ovaries left in place  . Total knee replacement, b/l    . Cataract extraction, b/l    . Skin bx, right anterior cw , squamous cancer, had to redo to margins  12/2010   . Bcc removed below lower lip on left  26 yrs ago  . Left knee arthroscopy and cleaning prior to tkr      Family History  Problem Relation Age of Onset  . Arthritis Mother     RA  . Myelodysplastic syndrome Mother   . Cancer Father     skin  . Stroke Father   . Stroke Maternal Grandmother   . Kidney failure Maternal Grandfather     History   Social History  . Marital Status: Married    Spouse Name: N/A    Number of Children: N/A  . Years of Education: N/A   Occupational History  . Not on file.   Social History Main Topics  . Smoking status: Never Smoker   . Smokeless tobacco: Never Used  . Alcohol Use: No  . Drug Use: No  . Sexual Activity: No   Other Topics Concern  . Not on file   Social History Narrative  . No narrative on file    Current Outpatient Prescriptions on File Prior to Visit  Medication Sig Dispense Refill  . citalopram (CELEXA) 10 MG tablet Take 1 tablet (10 mg total) by mouth daily.  30 tablet  3  . glucose blood (FREESTYLE LITE) test strip Use as instructed  100 each  12  . Insulin Pen Needle (PEN NEEDLES) 31G X 6 MM MISC Use with victoza daily DX 250.00  100 each  1  . Lancets MISC by Does not apply route. Check blood sugars qam and once daily prn       . Liraglutide (VICTOZA) 18 MG/3ML SOPN Inject 1.2 mg into the skin daily.  9 mL  2  . lisinopril (PRINIVIL,ZESTRIL) 5 MG tablet Take 1 tablet (5 mg total) by mouth daily.  90 tablet  3  . nystatin (MYCOSTATIN) powder Apply topically 2 (two) times daily.  30 g  0  . nystatin-triamcinolone ointment (MYCOLOG) Apply 1 application topically 2 (two) times daily.  60 g  1   No current facility-administered medications on file prior to visit.    Allergies  Allergen Reactions  . Metformin     REACTION: nausea, vomit, diarrhea    Review of Systems  Review of Systems  Constitutional: Negative for fever and malaise/fatigue.  HENT: Negative for congestion.   Eyes: Negative for discharge.   Respiratory: Negative for shortness of breath.   Cardiovascular: Negative for chest pain, palpitations and leg swelling.  Gastrointestinal: Negative for nausea, abdominal pain and diarrhea.  Genitourinary: Negative for dysuria.  Musculoskeletal: Negative for falls.  Skin: Negative for rash.  Neurological: Negative for loss of consciousness and headaches.  Endo/Heme/Allergies: Negative for polydipsia.  Psychiatric/Behavioral: Negative for depression and suicidal ideas. The patient is not nervous/anxious and does not have insomnia.     Objective  BP 117/55  Pulse 75  Temp(Src) 97.6 F (36.4 C) (Oral)  Ht 5\' 3"  (1.6 m)  Wt 208 lb 3.2 oz (94.439 kg)  BMI 36.89 kg/m2  SpO2 95%  Physical Exam Physical Exam  Constitutional: She is oriented to person, place, and time and well-developed, well-nourished, and in no distress. No distress.  HENT:  Head: Normocephalic and atraumatic.  Eyes: Conjunctivae are normal.  Neck: Neck supple. No thyromegaly present.  Cardiovascular: Normal rate, regular rhythm and normal heart sounds.   No murmur heard. Pulmonary/Chest: Effort normal and breath sounds normal. She has no wheezes.  Abdominal: She exhibits no distension and no mass.  Musculoskeletal: She exhibits no edema.  Lymphadenopathy:    She has no cervical adenopathy.  Neurological: She is alert and oriented to person, place, and time.  Skin: Skin is warm and dry. No rash noted. She is not diaphoretic.  Psychiatric: Memory, affect and judgment normal.    Lab Results  Component Value Date   TSH 2.12 10/09/2014   Lab Results  Component Value Date   WBC 7.7 10/09/2014   HGB 14.7 10/09/2014   HCT 44.5 10/09/2014   MCV 94.1 10/09/2014   PLT 203.0 10/09/2014   Lab Results  Component Value Date   CREATININE 0.8 10/16/2014   BUN 12 10/16/2014   NA 141 10/16/2014   K 4.2 10/16/2014   CL 104 10/16/2014   CO2 24 10/16/2014   Lab Results  Component Value Date   ALT 18 10/09/2014    AST 40* 10/09/2014   ALKPHOS 137* 10/09/2014   BILITOT 0.8 10/09/2014   Lab Results  Component Value Date   CHOL 186 10/09/2014   Lab Results  Component Value Date   HDL 33.90* 10/09/2014   Lab Results  Component Value Date   LDLCALC 122* 10/09/2014   Lab Results  Component Value Date   TRIG 153.0* 10/09/2014   Lab Results  Component Value Date   CHOLHDL 5 10/09/2014     Assessment & Plan  Diabetes mellitus type 2 in obese hgba1c acceptable, minimize simple carbs. Increase exercise as tolerated.  Continue current meds  MORBID OBESITY Encouraged DASH diet, decrease po intake and increase exercise as tolerated. Needs 7-8 hours of sleep nightly. Avoid trans fats, eat small, frequent meals every 4-5 hours with lean proteins, complex carbs and healthy fats. Minimize simple carbs, GMO foods.  Hyperlipidemia Encouraged heart healthy diet, increase exercise, avoid trans fats, consider a krill oil cap daily. Tolerating Mev[place]or will increase today  Hypokalemia Resolved on recheck

## 2014-10-19 NOTE — Assessment & Plan Note (Signed)
Encouraged DASH diet, decrease po intake and increase exercise as tolerated. Needs 7-8 hours of sleep nightly. Avoid trans fats, eat small, frequent meals every 4-5 hours with lean proteins, complex carbs and healthy fats. Minimize simple carbs, GMO foods. 

## 2014-10-19 NOTE — Assessment & Plan Note (Signed)
Resolved on recheck 

## 2014-10-19 NOTE — Assessment & Plan Note (Addendum)
Encouraged heart healthy diet, increase exercise, avoid trans fats, consider a krill oil cap daily. Tolerating Mev[place]or will increase today

## 2014-10-31 ENCOUNTER — Other Ambulatory Visit: Payer: Self-pay | Admitting: Family Medicine

## 2015-01-16 ENCOUNTER — Other Ambulatory Visit: Payer: Medicare Other

## 2015-01-19 ENCOUNTER — Other Ambulatory Visit (INDEPENDENT_AMBULATORY_CARE_PROVIDER_SITE_OTHER): Payer: Medicare Other

## 2015-01-19 DIAGNOSIS — I10 Essential (primary) hypertension: Secondary | ICD-10-CM

## 2015-01-19 DIAGNOSIS — E785 Hyperlipidemia, unspecified: Secondary | ICD-10-CM

## 2015-01-19 DIAGNOSIS — E1169 Type 2 diabetes mellitus with other specified complication: Secondary | ICD-10-CM

## 2015-01-19 DIAGNOSIS — E119 Type 2 diabetes mellitus without complications: Secondary | ICD-10-CM

## 2015-01-19 DIAGNOSIS — E669 Obesity, unspecified: Secondary | ICD-10-CM

## 2015-01-19 LAB — CBC
HCT: 42.8 % (ref 36.0–46.0)
Hemoglobin: 14.4 g/dL (ref 12.0–15.0)
MCHC: 33.6 g/dL (ref 30.0–36.0)
MCV: 92.5 fl (ref 78.0–100.0)
Platelets: 216 10*3/uL (ref 150.0–400.0)
RBC: 4.62 Mil/uL (ref 3.87–5.11)
RDW: 13.1 % (ref 11.5–15.5)
WBC: 9 10*3/uL (ref 4.0–10.5)

## 2015-01-19 LAB — RENAL FUNCTION PANEL
Albumin: 3.7 g/dL (ref 3.5–5.2)
BUN: 11 mg/dL (ref 6–23)
CHLORIDE: 104 meq/L (ref 96–112)
CO2: 24 meq/L (ref 19–32)
CREATININE: 0.68 mg/dL (ref 0.40–1.20)
Calcium: 9.4 mg/dL (ref 8.4–10.5)
GFR: 89.09 mL/min (ref >60.00)
Glucose, Bld: 173 mg/dL — ABNORMAL HIGH (ref 70–99)
POTASSIUM: 3.8 meq/L (ref 3.5–5.1)
Phosphorus: 3.5 mg/dL (ref 2.3–4.6)
Sodium: 138 mEq/L (ref 135–145)

## 2015-01-19 LAB — HEPATIC FUNCTION PANEL
ALT: 17 U/L (ref 0–35)
AST: 28 U/L (ref 0–37)
Albumin: 3.7 g/dL (ref 3.5–5.2)
Alkaline Phosphatase: 127 U/L — ABNORMAL HIGH (ref 39–117)
Bilirubin, Direct: 0.3 mg/dL (ref 0.0–0.3)
Total Bilirubin: 0.9 mg/dL (ref 0.2–1.2)
Total Protein: 7.3 g/dL (ref 6.0–8.3)

## 2015-01-19 LAB — LIPID PANEL
Cholesterol: 136 mg/dL (ref 0–200)
HDL: 36.2 mg/dL — ABNORMAL LOW (ref >39.00)
LDL Cholesterol: 72 mg/dL (ref 0–99)
NonHDL: 99.8
TRIGLYCERIDES: 139 mg/dL (ref 0.0–149.0)
Total CHOL/HDL Ratio: 4
VLDL: 27.8 mg/dL (ref 0.0–40.0)

## 2015-01-19 LAB — TSH: TSH: 3.51 u[IU]/mL (ref 0.35–4.50)

## 2015-01-19 LAB — HEMOGLOBIN A1C: Hgb A1c MFr Bld: 8.3 % — ABNORMAL HIGH (ref 4.6–6.5)

## 2015-01-23 ENCOUNTER — Ambulatory Visit (INDEPENDENT_AMBULATORY_CARE_PROVIDER_SITE_OTHER): Payer: Medicare Other | Admitting: Family Medicine

## 2015-01-23 ENCOUNTER — Encounter: Payer: Self-pay | Admitting: Family Medicine

## 2015-01-23 VITALS — BP 134/76 | HR 83 | Temp 98.1°F | Ht 63.0 in | Wt 209.2 lb

## 2015-01-23 DIAGNOSIS — E782 Mixed hyperlipidemia: Secondary | ICD-10-CM

## 2015-01-23 DIAGNOSIS — K219 Gastro-esophageal reflux disease without esophagitis: Secondary | ICD-10-CM

## 2015-01-23 DIAGNOSIS — E1169 Type 2 diabetes mellitus with other specified complication: Secondary | ICD-10-CM

## 2015-01-23 DIAGNOSIS — I10 Essential (primary) hypertension: Secondary | ICD-10-CM

## 2015-01-23 DIAGNOSIS — E119 Type 2 diabetes mellitus without complications: Secondary | ICD-10-CM

## 2015-01-23 DIAGNOSIS — E669 Obesity, unspecified: Secondary | ICD-10-CM

## 2015-01-23 MED ORDER — RANITIDINE HCL 300 MG PO TABS: 300.0000 mg | ORAL_TABLET | Freq: Every day | ORAL | Status: DC

## 2015-01-23 MED ORDER — METFORMIN HCL ER 500 MG PO TB24: ORAL_TABLET | ORAL | Status: DC

## 2015-01-23 NOTE — Assessment & Plan Note (Signed)
Tolerating statin, encouraged heart healthy diet, avoid trans fats, minimize simple carbs and saturated fats. Increase exercise as tolerated. Good response to statins

## 2015-01-23 NOTE — Progress Notes (Signed)
Stacey Cooper  [id number] 08/31/1935 01/23/2015      Progress Note-Follow Up  Subjective  Chief Complaint  Chief Complaint  Patient presents with  . Follow-up    3 mos    HPI  Patient is a 80 y.o. female in today for routine medical care. Doing well today. No acute concerns. No recent illness. Glucose at home morning 129. No polyuria or polydipsia. Denies CP/palp/SOB/HA/congestion/fevers/GI or GU c/o. Taking meds as prescribed Past Medical History  Diagnosis Date  . Diabetes mellitus   . UTI'S, HX OF 01/24/2011  . URINARY INCONTINENCE 02/07/2011  . SQUAMOUS CELL CARCINOMA OTHER SPEC SITES SKIN 01/24/2011  . NONSPEC ELEVATION OF LEVELS OF TRANSAMINASE/LDH 02/07/2011  . Morbid obesity 01/24/2011  . MEASLES, HX OF 01/24/2011  . HAIR LOSS 01/24/2011  . GERD 01/24/2011  . Essential hypertension, benign 01/24/2011  . DM 01/24/2011  . CHOLELITHIASIS, HX OF 02/07/2011  . Basal cell carcinoma of skin of lip 01/24/2011  . ABDOMINAL PAIN, RIGHT UPPER QUADRANT, HX OF 01/24/2011  . Peripheral neuropathy 04/20/2011  . SCC (squamous cell carcinoma), face 07/18/2011  . Actinic keratoses 07/18/2011  . Back pain 12/08/2011  . Hyperlipidemia 04/24/2012  . Insomnia 07/24/2012  . Bronchitis 12/20/2012  . Leg cramps 06/06/2013  . Hearing loss of both ears 06/28/2013  . Grief reaction 01/20/2014  . Urinary tract infection, site not specified 04/30/2014  . Diabetes mellitus type 2 in obese 01/24/2011    Qualifier: Diagnosis of  By: [name], [name]  Annual eye exam with Felicity Coyer on 06/14/13     Past Surgical History  Procedure Laterality Date  . Abdominal hysterectomy      partial, both ovaries left in place  . Total knee replacement, b/l    . Cataract extraction, b/l    . Skin bx, right anterior cw , squamous cancer, had to redo to margins  12/2010  . Bcc removed below lower lip on left  26 yrs ago  . Left knee arthroscopy and cleaning prior to tkr      Family History  Problem Relation Age  of Onset  . Arthritis Mother     RA  . Myelodysplastic syndrome Mother   . Cancer Father     skin  . Stroke Father   . Stroke Maternal Grandmother   . Kidney failure Maternal Grandfather     History   Social History  . Marital Status: Married    Spouse Name: N/A    Number of Children: N/A  . Years of Education: N/A   Occupational History  . Not on file.   Social History Main Topics  . Smoking status: Never Smoker   . Smokeless tobacco: Never Used  . Alcohol Use: No  . Drug Use: No  . Sexual Activity: No   Other Topics Concern  . Not on file   Social History Narrative    Current Outpatient Prescriptions on File Prior to Visit  Medication Sig Dispense Refill  . citalopram (CELEXA) 10 MG tablet Take 1 tablet (10 mg total) by mouth daily. 30 tablet 3  . glucose blood (FREESTYLE LITE) test strip Use as instructed 100 each 12  . Insulin Pen Needle (PEN NEEDLES) 31G X 6 MM MISC Use with victoza daily DX 250.00 100 each 1  . Lancets MISC by Does not apply route. Check blood sugars qam and once daily prn     . Liraglutide (VICTOZA) 18 MG/3ML SOPN Inject 1.2 mg into the skin daily. 9  mL 2  . lisinopril (PRINIVIL,ZESTRIL) 5 MG tablet TAKE 1 TABLET (5 MG TOTAL) BY MOUTH DAILY. 90 tablet 0  . lovastatin (MEVACOR) 20 MG tablet Take 1 tablet (20 mg total) by mouth at bedtime. 30 tablet 4  . metFORMIN (GLUCOPHAGE-XR) 500 MG 24 hr tablet 2 tabs in am and 1 tab in pm 90 tablet 4  . nystatin (MYCOSTATIN) powder Apply topically 2 (two) times daily. 30 g 0  . nystatin-triamcinolone ointment (MYCOLOG) Apply 1 application topically 2 (two) times daily. 60 g 1   No current facility-administered medications on file prior to visit.    Allergies  Allergen Reactions  . Metformin     REACTION: nausea, vomit, diarrhea    Review of Systems  Review of Systems  Constitutional: Negative for fever and malaise/fatigue.  HENT: Negative for congestion.   Eyes: Negative for discharge.    Respiratory: Negative for shortness of breath.   Cardiovascular: Negative for chest pain, palpitations and leg swelling.  Gastrointestinal: Negative for nausea, abdominal pain and diarrhea.  Genitourinary: Negative for dysuria.  Musculoskeletal: Negative for falls.  Skin: Negative for rash.  Neurological: Negative for loss of consciousness and headaches.  Endo/Heme/Allergies: Negative for polydipsia.  Psychiatric/Behavioral: Negative for depression and suicidal ideas. The patient is not nervous/anxious and does not have insomnia.     Objective  BP 134/76 mmHg  Pulse 83  Temp(Src) 98.1 F (36.7 C) (Oral)  Ht 5\' 3"  (1.6 m)  Wt 209 lb 3.2 oz (94.892 kg)  BMI 37.07 kg/m2  SpO2 99%  Physical Exam  Physical Exam  Constitutional: She is oriented to person, place, and time and well-developed, well-nourished, and in no distress. No distress.  HENT:  Head: Normocephalic and atraumatic.  Eyes: Conjunctivae are normal.  Neck: Neck supple. No thyromegaly present.  Cardiovascular: Normal rate, regular rhythm and normal heart sounds.   No murmur heard. Pulmonary/Chest: Effort normal and breath sounds normal. She has no wheezes.  Abdominal: She exhibits no distension and no mass.  Musculoskeletal: She exhibits no edema.  Lymphadenopathy:    She has no cervical adenopathy.  Neurological: She is alert and oriented to person, place, and time.  Skin: Skin is warm and dry. No rash noted. She is not diaphoretic.  Psychiatric: Memory, affect and judgment normal.    Lab Results  Component Value Date   TSH 3.51 01/19/2015   Lab Results  Component Value Date   WBC 9.0 01/19/2015   HGB 14.4 01/19/2015   HCT 42.8 01/19/2015   MCV 92.5 01/19/2015   PLT 216.0 01/19/2015   Lab Results  Component Value Date   CREATININE 0.68 01/19/2015   BUN 11 01/19/2015   NA 138 01/19/2015   K 3.8 01/19/2015   CL 104 01/19/2015   CO2 24 01/19/2015   Lab Results  Component Value Date   ALT 17  01/19/2015   AST 28 01/19/2015   ALKPHOS 127* 01/19/2015   BILITOT 0.9 01/19/2015   Lab Results  Component Value Date   CHOL 136 01/19/2015   Lab Results  Component Value Date   HDL 36.20* 01/19/2015   Lab Results  Component Value Date   LDLCALC 72 01/19/2015   Lab Results  Component Value Date   TRIG 139.0 01/19/2015   Lab Results  Component Value Date   CHOLHDL 4 01/19/2015   ESSENTIAL HYPERTENSION, BENIGN Well controlled, no changes to meds. Encouraged heart healthy diet such as the DASH diet and exercise as tolerated.    GERD  Recently flared, refilled prescription for Ranitidine. Avoid offending foods, start probiotics. Do not eat large meals in late evening and consider raising head of bed.    Diabetes mellitus type 2 in obese hgba1c unacceptable, minimize simple carbs. Increase exercise as tolerated. Continue current meds but increase to 2 Metfromin bid.   Hyperlipidemia, mixed Tolerating statin, encouraged heart healthy diet, avoid trans fats, minimize simple carbs and saturated fats. Increase exercise as tolerated. Good response to statins   Severe obesity (BMI >= 40) Encouraged DASH diet, decrease po intake and increase exercise as tolerated. Needs 7-8 hours of sleep nightly. Avoid trans fats, eat small, frequent meals every 4-5 hours with lean proteins, complex carbs and healthy fats. Minimize simple carbs

## 2015-01-23 NOTE — Assessment & Plan Note (Signed)
Recently flared, refilled prescription for Ranitidine. Avoid offending foods, start probiotics. Do not eat large meals in late evening and consider raising head of bed.

## 2015-01-23 NOTE — Progress Notes (Signed)
Pre visit review using our clinic review tool, if applicable. No additional management support is needed unless otherwise documented below in the visit note. 

## 2015-01-23 NOTE — Assessment & Plan Note (Signed)
hgba1c unacceptable, minimize simple carbs. Increase exercise as tolerated. Continue current meds but increase to 2 Metfromin bid.

## 2015-01-23 NOTE — Patient Instructions (Signed)

## 2015-01-23 NOTE — Assessment & Plan Note (Signed)
Well controlled, no changes to meds. Encouraged heart healthy diet such as the DASH diet and exercise as tolerated.  °

## 2015-02-01 NOTE — Assessment & Plan Note (Signed)
Encouraged DASH diet, decrease po intake and increase exercise as tolerated. Needs 7-8 hours of sleep nightly. Avoid trans fats, eat small, frequent meals every 4-5 hours with lean proteins, complex carbs and healthy fats. Minimize simple carbs 

## 2015-02-16 ENCOUNTER — Ambulatory Visit (INDEPENDENT_AMBULATORY_CARE_PROVIDER_SITE_OTHER): Payer: Medicare Other | Admitting: Physician Assistant

## 2015-02-16 ENCOUNTER — Encounter: Payer: Self-pay | Admitting: Physician Assistant

## 2015-02-16 VITALS — BP 138/54 | HR 87 | Temp 98.4°F | Resp 16 | Ht 63.0 in | Wt 209.5 lb

## 2015-02-16 DIAGNOSIS — J209 Acute bronchitis, unspecified: Secondary | ICD-10-CM

## 2015-02-16 MED ORDER — HYDROCOD POLST-CHLORPHEN POLST 10-8 MG/5ML PO LQCR: 5.0000 mL | Freq: Two times a day (BID) | ORAL | Status: DC | PRN

## 2015-02-16 MED ORDER — AZITHROMYCIN 250 MG PO TABS: ORAL_TABLET | ORAL | Status: DC

## 2015-02-16 NOTE — Progress Notes (Signed)
Pre visit review using our clinic review tool, if applicable. No additional management support is needed unless otherwise documented below in the visit note/SLS  

## 2015-02-16 NOTE — Patient Instructions (Signed)
Please take your antibiotic (Azithromycin) as directed.   Stay well hydrated and continue the plain Mucinex. Use Tussionex as directed for cough. Place a humidifier in the bedroom.  Call or return to clinic if symptoms are not improving.

## 2015-02-16 NOTE — Progress Notes (Signed)
  Subjective:     Stacey Cooper is a 80 y.o. female who presents for evaluation of symptoms of a URI. Symptoms include congestion, post nasal drip, productive cough with  green colored sputum and sore throat. Patient also with low-grade fever that has been intermittent. Onset of symptoms was 5 days ago, and has been gradually worsening since that time. Treatment to date: cough suppressants.  The following portions of the patient's history were reviewed and updated as appropriate: allergies, current medications, past family history, past medical history, past social history, past surgical history and problem list.  Review of Systems Pertinent items are noted in HPI.   Objective:    Ht 5\' 3"  (1.6 m)  Wt 209 lb 8 oz (95.029 kg)  BMI 37.12 kg/m2 General appearance: alert, cooperative, appears stated age and no distress Head: Normocephalic, without obvious abnormality, atraumatic Ears: normal TM's and external ear canals both ears and normal TM&#39;s and external ear canals both ears Nose: Nares normal. Septum midline. Mucosa normal. No drainage or sinus tenderness. Throat: lips, mucosa, and tongue normal; teeth and gums normal Lungs: clear to auscultation bilaterally Heart: regular rate and rhythm, S1, S2 normal, no murmur, click, rub or gallop Lymph nodes: Cervical, supraclavicular, and axillary nodes normal.   Assessment:    bronchitis   Plan:    Discussed diagnosis and treatment of URI. Suggested symptomatic OTC remedies. Nasal saline spray for congestion. Zithromax per orders. Plain Mucinex as directed.   Follow-up PRN.

## 2015-02-21 ENCOUNTER — Other Ambulatory Visit: Payer: Self-pay | Admitting: Family Medicine

## 2015-03-10 ENCOUNTER — Other Ambulatory Visit: Payer: Self-pay | Admitting: Family Medicine

## 2015-03-30 ENCOUNTER — Other Ambulatory Visit: Payer: Self-pay

## 2015-03-31 ENCOUNTER — Encounter: Payer: Self-pay | Admitting: Family Medicine

## 2015-03-31 ENCOUNTER — Ambulatory Visit (INDEPENDENT_AMBULATORY_CARE_PROVIDER_SITE_OTHER): Payer: Medicare Other | Admitting: Family Medicine

## 2015-03-31 ENCOUNTER — Ambulatory Visit (HOSPITAL_BASED_OUTPATIENT_CLINIC_OR_DEPARTMENT_OTHER)
Admission: RE | Admit: 2015-03-31 | Discharge: 2015-03-31 | Disposition: A | Discharge status: Home / Self Care | Payer: Medicare Other | Source: Ambulatory Visit | Attending: Family Medicine | Admitting: Family Medicine

## 2015-03-31 VITALS — BP 126/74 | HR 84 | Temp 98.1°F | Ht 63.0 in | Wt 211.5 lb

## 2015-03-31 DIAGNOSIS — R3915 Urgency of urination: Secondary | ICD-10-CM

## 2015-03-31 DIAGNOSIS — Z9189 Other specified personal risk factors, not elsewhere classified: Secondary | ICD-10-CM

## 2015-03-31 DIAGNOSIS — E669 Obesity, unspecified: Secondary | ICD-10-CM

## 2015-03-31 DIAGNOSIS — E1169 Type 2 diabetes mellitus with other specified complication: Secondary | ICD-10-CM

## 2015-03-31 DIAGNOSIS — M545 Low back pain: Secondary | ICD-10-CM

## 2015-03-31 DIAGNOSIS — R109 Unspecified abdominal pain: Secondary | ICD-10-CM | POA: Diagnosis not present

## 2015-03-31 DIAGNOSIS — R1901 Right upper quadrant abdominal swelling, mass and lump: Secondary | ICD-10-CM | POA: Diagnosis present

## 2015-03-31 DIAGNOSIS — M47896 Other spondylosis, lumbar region: Secondary | ICD-10-CM | POA: Insufficient documentation

## 2015-03-31 DIAGNOSIS — R1030 Lower abdominal pain, unspecified: Secondary | ICD-10-CM | POA: Insufficient documentation

## 2015-03-31 DIAGNOSIS — M4316 Spondylolisthesis, lumbar region: Secondary | ICD-10-CM | POA: Diagnosis not present

## 2015-03-31 DIAGNOSIS — R32 Unspecified urinary incontinence: Secondary | ICD-10-CM | POA: Diagnosis not present

## 2015-03-31 DIAGNOSIS — E119 Type 2 diabetes mellitus without complications: Secondary | ICD-10-CM

## 2015-03-31 DIAGNOSIS — I10 Essential (primary) hypertension: Secondary | ICD-10-CM

## 2015-03-31 MED ORDER — METHYLPREDNISOLONE (PAK) 4 MG PO TABS
ORAL_TABLET | ORAL | Status: DC
Start: 2015-03-31 — End: 2015-04-27

## 2015-03-31 NOTE — Progress Notes (Signed)
Pre visit review using our clinic review tool, if applicable. No additional management support is needed unless otherwise documented below in the visit note. 

## 2015-04-01 ENCOUNTER — Other Ambulatory Visit: Payer: Self-pay | Admitting: Family Medicine

## 2015-04-01 ENCOUNTER — Telehealth: Payer: Self-pay | Admitting: *Deleted

## 2015-04-01 DIAGNOSIS — R109 Unspecified abdominal pain: Secondary | ICD-10-CM

## 2015-04-01 DIAGNOSIS — D72829 Elevated white blood cell count, unspecified: Secondary | ICD-10-CM

## 2015-04-01 LAB — COMPREHENSIVE METABOLIC PANEL
ALT: 18 U/L (ref 0–35)
AST: 31 U/L (ref 0–37)
Albumin: 3.9 g/dL (ref 3.5–5.2)
Alkaline Phosphatase: 136 U/L — ABNORMAL HIGH (ref 39–117)
BUN: 15 mg/dL (ref 6–23)
CO2: 26 mEq/L (ref 19–32)
Calcium: 9.9 mg/dL (ref 8.4–10.5)
Chloride: 102 mEq/L (ref 96–112)
Creatinine, Ser: 1.03 mg/dL (ref 0.40–1.20)
GFR: 55.14 mL/min — AB (ref >60.00)
GLUCOSE: 186 mg/dL — AB (ref 70–99)
Potassium: 3.6 mEq/L (ref 3.5–5.1)
SODIUM: 136 meq/L (ref 135–145)
Total Bilirubin: 0.7 mg/dL (ref 0.2–1.2)
Total Protein: 7.7 g/dL (ref 6.0–8.3)

## 2015-04-01 LAB — CBC
HCT: 42.7 % (ref 36.0–46.0)
Hemoglobin: 14.8 g/dL (ref 12.0–15.0)
MCHC: 34.6 g/dL (ref 30.0–36.0)
MCV: 90.8 fl (ref 78.0–100.0)
Platelets: 199 10*3/uL (ref 150.0–400.0)
RBC: 4.7 Mil/uL (ref 3.87–5.11)
RDW: 13.2 % (ref 11.5–15.5)
WBC: 10.8 10*3/uL — ABNORMAL HIGH (ref 4.0–10.5)

## 2015-04-01 LAB — URINALYSIS
Bilirubin Urine: NEGATIVE
HGB URINE DIPSTICK: NEGATIVE
KETONES UR: NEGATIVE
Leukocytes, UA: NEGATIVE
Nitrite: NEGATIVE
PH: 5.5 (ref 5.0–8.0)
SPECIFIC GRAVITY, URINE: 1.025 (ref 1.000–1.030)
Total Protein, Urine: NEGATIVE
Urine Glucose: NEGATIVE
Urobilinogen, UA: 0.2 (ref 0.0–1.0)

## 2015-04-01 LAB — URINE CULTURE
COLONY COUNT: NO GROWTH
Organism ID, Bacteria: NO GROWTH

## 2015-04-01 NOTE — Telephone Encounter (Signed)
So see how the patient is doing if she is doing OK we could wait til Monday for test since she is already on day 3 of a  5 day course of Methylprednisolone, I would like her to repeat a CBC today if she could though. If she is worse she will need to proceed with CT without the contrast. Thx

## 2015-04-01 NOTE — Telephone Encounter (Signed)
Imaging is trying to arrange CT, and when they spoke to patient she stated she had an allergic reaction to the CT contrast dye.  Normally, they would prescribe 32 mg methylprednisolone 13 hrs, 7 hrs, and 1 hr before and then benadryl.  However, the patient is already on Methylprednisolone.  They would like to know how to proceed- either could do without dye, but would most likely not see what they would need.  They could also wait until she was done with the Methylprednisolone.    Please advise.

## 2015-04-02 NOTE — Telephone Encounter (Signed)
Patient states she is doing fine and that the pain has decreased.  She states she would be able to wait until next week to proceed with the CT scan, and she is just interested in finding out where the pain is coming from.

## 2015-04-03 ENCOUNTER — Other Ambulatory Visit: Payer: Medicare Other

## 2015-04-03 DIAGNOSIS — D72829 Elevated white blood cell count, unspecified: Secondary | ICD-10-CM

## 2015-04-03 LAB — CBC WITH DIFFERENTIAL/PLATELET
BASOS ABS: 0 10*3/uL (ref 0.0–0.1)
Basophils Relative: 0 % (ref 0–1)
EOS ABS: 0 10*3/uL (ref 0.0–0.7)
Eosinophils Relative: 0 % (ref 0–5)
HEMATOCRIT: 41.6 % (ref 36.0–46.0)
HEMOGLOBIN: 14.6 g/dL (ref 12.0–15.0)
Lymphocytes Relative: 12 % (ref 12–46)
Lymphs Abs: 2 10*3/uL (ref 0.7–4.0)
MCH: 31.9 pg (ref 26.0–34.0)
MCHC: 35.1 g/dL (ref 30.0–36.0)
MCV: 90.8 fL (ref 78.0–100.0)
MPV: 10.5 fL (ref 8.6–12.4)
Monocytes Absolute: 1.3 10*3/uL — ABNORMAL HIGH (ref 0.1–1.0)
Monocytes Relative: 8 % (ref 3–12)
Neutro Abs: 13.2 10*3/uL — ABNORMAL HIGH (ref 1.7–7.7)
Neutrophils Relative %: 80 % — ABNORMAL HIGH (ref 43–77)
Platelets: 228 10*3/uL (ref 150–400)
RBC: 4.58 MIL/uL (ref 3.87–5.11)
RDW: 13.5 % (ref 11.5–15.5)
WBC: 16.5 10*3/uL — ABNORMAL HIGH (ref 4.0–10.5)

## 2015-04-03 NOTE — Addendum Note (Signed)
Addended by: Leticia Penna A on: 04/03/2015 11:34 AM   Modules accepted: Orders

## 2015-04-03 NOTE — Telephone Encounter (Signed)
Patient scheduled for lab appointment today and CBC ordered.

## 2015-04-05 ENCOUNTER — Telehealth: Payer: Self-pay | Admitting: Family Medicine

## 2015-04-05 NOTE — Telephone Encounter (Signed)
Spoke with patient by phone. WBC up but patient on steroids. Unfortunately continues to have abdominal and back pain but she denies fevers and chills. Her bowels continued to move but she acknowledges they have not moved in 2 days and have decreased in size both smaller caliber and smaller amount over past 1-2 weeks. She has not seen any blood this month but does note she had some blood in her stool last month.  She is finishing her steroid pack in am and will then proceed with steroid and Benadryl prep recommended by radiology due to previous difficulties with contrast. She is aware that if she worsens she should seek immediate care and expresses understanding

## 2015-04-05 NOTE — Assessment & Plan Note (Signed)
Well controlled, no changes to meds. Encouraged heart healthy diet such as the DASH diet and exercise as tolerated.  °

## 2015-04-05 NOTE — Progress Notes (Signed)
Stacey Cooper  [id number] February 09, 1935 04/05/2015      Progress Note-Follow Up  Subjective  Chief Complaint  Chief Complaint  Patient presents with  . Back Pain    Right side, Pt c/o some Nausea, denies vomiting, fever, constipation or diarrhea  . Abdominal Pain    x1 week, Right side, down into groin area, Pt was mowing last week and pain started    HPI  Patient is a 80 y.o. female in today for routine medical care. Patient is in today for evaluation of right-sided abdominal and back pain. It has been persistent for several weeks and is worsening. Initially she thought it was her right hip but now is more in the right lower and right upper quadrant. Does feel as if it rotates around from her spine forward and does acknowledge that it started shortly after she rode on her lawnmower doing her yard for quite some time. Denies fevers and chills. Denies anorexia. Denies diarrhea or constipation. Denies CP/palp/SOB/HA/congestion/fevers/GI or GU c/o. Taking meds as prescribed  Past Medical History  Diagnosis Date  . Diabetes mellitus   . UTI'S, HX OF 01/24/2011  . URINARY INCONTINENCE 02/07/2011  . SQUAMOUS CELL CARCINOMA OTHER SPEC SITES SKIN 01/24/2011  . NONSPEC ELEVATION OF LEVELS OF TRANSAMINASE/LDH 02/07/2011  . Morbid obesity 01/24/2011  . MEASLES, HX OF 01/24/2011  . HAIR LOSS 01/24/2011  . GERD 01/24/2011  . Essential hypertension, benign 01/24/2011  . DM 01/24/2011  . CHOLELITHIASIS, HX OF 02/07/2011  . Basal cell carcinoma of skin of lip 01/24/2011  . ABDOMINAL PAIN, RIGHT UPPER QUADRANT, HX OF 01/24/2011  . Peripheral neuropathy 04/20/2011  . SCC (squamous cell carcinoma), face 07/18/2011  . Actinic keratoses 07/18/2011  . Back pain 12/08/2011  . Hyperlipidemia 04/24/2012  . Insomnia 07/24/2012  . Bronchitis 12/20/2012  . Leg cramps 06/06/2013  . Hearing loss of both ears 06/28/2013  . Grief reaction 01/20/2014  . Urinary tract infection, site not specified 04/30/2014  . Diabetes  mellitus type 2 in obese 01/24/2011    Qualifier: Diagnosis of  By: [name], [name]  Annual eye exam with [facility] on 06/14/13   . Hyperlipidemia, mixed 04/24/2012    Past Surgical History  Procedure Laterality Date  . Abdominal hysterectomy      partial, both ovaries left in place  . Total knee replacement, b/l    . Cataract extraction, b/l    . Skin bx, right anterior cw , squamous cancer, had to redo to margins  12/2010  . Bcc removed below lower lip on left  26 yrs ago  . Left knee arthroscopy and cleaning prior to tkr      Family History  Problem Relation Age of Onset  . Arthritis Mother     RA  . Myelodysplastic syndrome Mother   . Cancer Father     skin  . Stroke Father   . Stroke Maternal Grandmother   . Kidney failure Maternal Grandfather     History   Social History  . Marital Status: Married    Spouse Name: N/A  . Number of Children: N/A  . Years of Education: N/A   Occupational History  . Not on file.   Social History Main Topics  . Smoking status: Never Smoker   . Smokeless tobacco: Never Used  . Alcohol Use: No  . Drug Use: No  . Sexual Activity: No   Other Topics Concern  . Not on file   Social History Narrative  Current Outpatient Prescriptions on File Prior to Visit  Medication Sig Dispense Refill  . Liraglutide (VICTOZA) 18 MG/3ML SOPN Inject 1.2 mg into the skin daily. 9 mL 2  . lisinopril (PRINIVIL,ZESTRIL) 5 MG tablet TAKE 1 TABLET (5 MG TOTAL) BY MOUTH DAILY. 90 tablet 2  . lovastatin (MEVACOR) 20 MG tablet Take 1 tablet (20 mg total) by mouth at bedtime. 30 tablet 4  . metFORMIN (GLUCOPHAGE-XR) 500 MG 24 hr tablet 2 tabs in am and 1 tab in pm 120 tablet 4  . ranitidine (ZANTAC) 300 MG tablet Take 1 tablet (300 mg total) by mouth daily. 90 tablet 1  . chlorpheniramine-HYDROcodone (TUSSIONEX) 10-8 MG/5ML LQCR Take 5 mLs by mouth every 12 (twelve) hours as needed for cough. (Patient not taking: Reported on 03/31/2015) 115 mL 0  .  glucose blood (FREESTYLE LITE) test strip Use as instructed (Patient not taking: Reported on 03/31/2015) 100 each 12  . Insulin Pen Needle (NOVOFINE) 32G X 6 MM MISC Use with Victoza (Patient not taking: Reported on 03/31/2015) 100 each 1  . Lancets MISC by Does not apply route. Check blood sugars qam and once daily prn      No current facility-administered medications on file prior to visit.    Allergies  Allergen Reactions  . Metformin     REACTION: nausea, vomit, diarrhea    Review of Systems  Review of Systems  Constitutional: Negative for fever, chills and malaise/fatigue.  HENT: Negative for congestion, hearing loss and nosebleeds.   Eyes: Negative for discharge.  Respiratory: Negative for cough, sputum production, shortness of breath and wheezing.   Cardiovascular: Negative for chest pain, palpitations and leg swelling.  Gastrointestinal: Positive for abdominal pain. Negative for heartburn, nausea, vomiting, diarrhea, constipation and melena.  Genitourinary: Negative for dysuria, urgency, frequency and hematuria.  Musculoskeletal: Positive for joint pain. Negative for myalgias and falls.  Skin: Negative for rash.  Neurological: Negative for dizziness, tremors, sensory change, focal weakness, loss of consciousness, weakness and headaches.  Endo/Heme/Allergies: Negative for polydipsia. Does not bruise/bleed easily.  Psychiatric/Behavioral: Negative for depression and suicidal ideas. The patient is not nervous/anxious and does not have insomnia.     Objective  BP 126/74 mmHg  Pulse 84  Temp(Src) 98.1 F (36.7 C) (Oral)  Ht 5\' 3"  (1.6 m)  Wt 211 lb 8 oz (95.936 kg)  BMI 37.48 kg/m2  SpO2 98%  Physical Exam  Physical Exam  Constitutional: She is oriented to person, place, and time and well-developed, well-nourished, and in no distress. No distress.  HENT:  Head: Normocephalic and atraumatic.  Eyes: Conjunctivae are normal.  Neck: Neck supple. No thyromegaly present.    Cardiovascular: Normal rate, regular rhythm and normal heart sounds.   No murmur heard. Pulmonary/Chest: Effort normal and breath sounds normal. She has no wheezes.  Abdominal: Soft. She exhibits mass. She exhibits no distension. There is tenderness. There is no rebound and no guarding.  Fullness on palp, RUQ  Musculoskeletal: She exhibits no edema.  Lymphadenopathy:    She has no cervical adenopathy.  Neurological: She is alert and oriented to person, place, and time.  Skin: Skin is warm and dry. No rash noted. She is not diaphoretic.  Psychiatric: Memory, affect and judgment normal.    Lab Results  Component Value Date   TSH 3.51 01/19/2015   Lab Results  Component Value Date   WBC 16.5* 04/03/2015   HGB 14.6 04/03/2015   HCT 41.6 04/03/2015   MCV 90.8 04/03/2015  PLT 228 04/03/2015   Lab Results  Component Value Date   CREATININE 1.03 03/31/2015   BUN 15 03/31/2015   NA 136 03/31/2015   K 3.6 03/31/2015   CL 102 03/31/2015   CO2 26 03/31/2015   Lab Results  Component Value Date   ALT 18 03/31/2015   AST 31 03/31/2015   ALKPHOS 136* 03/31/2015   BILITOT 0.7 03/31/2015   Lab Results  Component Value Date   CHOL 136 01/19/2015   Lab Results  Component Value Date   HDL 36.20* 01/19/2015   Lab Results  Component Value Date   LDLCALC 72 01/19/2015   Lab Results  Component Value Date   TRIG 139.0 01/19/2015   Lab Results  Component Value Date   CHOLHDL 4 01/19/2015     Assessment & Plan  ESSENTIAL HYPERTENSION, BENIGN Well controlled, no changes to meds. Encouraged heart healthy diet such as the DASH diet and exercise as tolerated.    Diabetes mellitus type 2 in obese Sugar up some with pain and will be on a 5 day course of steroids, increase Victoza to 1.8 daily for now and minimize simple carbs.    History of other specified conditions presenting hazards to health And rlq unclear etiology although pain does radiate along right flank to mid  back. Try a steroid pack and topical treatments such as salon pas. Report if symptoms worsen. Will also proceed with CT abdomen. Maintain adequate hydration and bland diet, seek care if worsens   Back pain Right sided, no acute injury, marginal response to Ibuprofen, try Medrol dosepak.

## 2015-04-05 NOTE — Assessment & Plan Note (Signed)
And rlq unclear etiology although pain does radiate along right flank to mid back. Try a steroid pack and topical treatments such as salon pas. Report if symptoms worsen. Will also proceed with CT abdomen. Maintain adequate hydration and bland diet, seek care if worsens

## 2015-04-05 NOTE — Assessment & Plan Note (Signed)
Sugar up some with pain and will be on a 5 day course of steroids, increase Victoza to 1.8 daily for now and minimize simple carbs.

## 2015-04-05 NOTE — Assessment & Plan Note (Signed)
Right sided, no acute injury, marginal response to Ibuprofen, try Medrol dosepak.

## 2015-04-06 ENCOUNTER — Other Ambulatory Visit: Payer: Self-pay | Admitting: Family Medicine

## 2015-04-06 MED ORDER — METHYLPREDNISOLONE 32 MG PO TABS: ORAL_TABLET | ORAL | Status: DC

## 2015-04-06 NOTE — Telephone Encounter (Signed)
CT would like to proceed with regular prep, 32 mg methylprednisolone 13 hrs, 7 hrs, and 1 hr before and then benadryl. Please advise.

## 2015-04-06 NOTE — Telephone Encounter (Signed)
i have ordered her Methyprednisolone, have her pick up some Benadryl 25 mg tabs 1 tab  With each dose of Methylprednisolone

## 2015-04-07 ENCOUNTER — Other Ambulatory Visit: Payer: Self-pay | Admitting: Family Medicine

## 2015-04-07 NOTE — Telephone Encounter (Signed)
Patient has been informed regarding benadryl instructions as well as methylprednisolone.  Patient was told CT would have to get approved again as first one was canceled.  Advise if needs to go back to [place] to schedule?

## 2015-04-08 ENCOUNTER — Encounter (HOSPITAL_BASED_OUTPATIENT_CLINIC_OR_DEPARTMENT_OTHER): Payer: Self-pay

## 2015-04-08 ENCOUNTER — Ambulatory Visit (HOSPITAL_BASED_OUTPATIENT_CLINIC_OR_DEPARTMENT_OTHER)
Admission: RE | Admit: 2015-04-08 | Discharge: 2015-04-08 | Disposition: A | Discharge status: Home / Self Care | Payer: Medicare Other | Source: Ambulatory Visit | Attending: Family Medicine | Admitting: Family Medicine

## 2015-04-08 DIAGNOSIS — K7460 Unspecified cirrhosis of liver: Secondary | ICD-10-CM | POA: Insufficient documentation

## 2015-04-08 DIAGNOSIS — K766 Portal hypertension: Secondary | ICD-10-CM | POA: Diagnosis not present

## 2015-04-08 DIAGNOSIS — K8020 Calculus of gallbladder without cholecystitis without obstruction: Secondary | ICD-10-CM | POA: Diagnosis not present

## 2015-04-08 DIAGNOSIS — R109 Unspecified abdominal pain: Secondary | ICD-10-CM

## 2015-04-08 MED ORDER — IOHEXOL 300 MG/ML  SOLN
100.0000 mL | Freq: Once | INTRAMUSCULAR | Status: AC | PRN
  Administered 2015-04-08: 100 mL via INTRAVENOUS

## 2015-04-10 ENCOUNTER — Other Ambulatory Visit: Payer: Self-pay | Admitting: Family Medicine

## 2015-04-10 DIAGNOSIS — I8500 Esophageal varices without bleeding: Secondary | ICD-10-CM

## 2015-04-11 ENCOUNTER — Other Ambulatory Visit: Payer: Self-pay | Admitting: Family Medicine

## 2015-04-13 ENCOUNTER — Other Ambulatory Visit: Payer: Self-pay | Admitting: Family Medicine

## 2015-04-13 MED ORDER — METFORMIN HCL ER 500 MG PO TB24: ORAL_TABLET | ORAL | Status: DC

## 2015-04-13 MED ORDER — LOVASTATIN 20 MG PO TABS: 20.0000 mg | ORAL_TABLET | Freq: Every day | ORAL | Status: DC

## 2015-04-14 NOTE — Telephone Encounter (Signed)
Pt called in and requested to speak with Robin regarding CT referral

## 2015-04-14 NOTE — Telephone Encounter (Signed)
Spoke at length with this patient. For now her pain is gone, she is taking 2 ibuprofen in the am and 2 in the evening.  She has no appetitie, has had diarrhea times 3 days.  She was contacted this am from [facility] and was told the CT result would not be seen by their physician.  Because of this she is thinking is would be better to wait until seeing Dr. Charlett Blake (does not have the money for to do more test done already) and not do the GI referral yet.  She will do whatever Dr. Charlett Blake feels best for her, but she does want a female MD at [facility] (Dr. Olevia Perches) if PCP prefers her move forward with the referral..

## 2015-04-14 NOTE — Telephone Encounter (Signed)
OK to wait for appt with me as long as she is feeling OK and has appt in next 2-4 weeks. Seek care if symptoms worsen

## 2015-04-14 NOTE — Telephone Encounter (Signed)
b

## 2015-04-16 NOTE — Telephone Encounter (Signed)
Called left message to call back 

## 2015-04-16 NOTE — Telephone Encounter (Signed)
Called the patient informed of PCP instructions. She is doing ok today and will wait to see Dr. Charlett Blake on Apr 27, 2015

## 2015-04-24 ENCOUNTER — Ambulatory Visit (INDEPENDENT_AMBULATORY_CARE_PROVIDER_SITE_OTHER): Payer: Medicare Other | Admitting: Family Medicine

## 2015-04-24 DIAGNOSIS — E669 Obesity, unspecified: Secondary | ICD-10-CM | POA: Diagnosis not present

## 2015-04-24 DIAGNOSIS — E782 Mixed hyperlipidemia: Secondary | ICD-10-CM | POA: Diagnosis not present

## 2015-04-24 DIAGNOSIS — E1169 Type 2 diabetes mellitus with other specified complication: Secondary | ICD-10-CM

## 2015-04-24 DIAGNOSIS — E119 Type 2 diabetes mellitus without complications: Secondary | ICD-10-CM

## 2015-04-24 DIAGNOSIS — I10 Essential (primary) hypertension: Secondary | ICD-10-CM

## 2015-04-24 LAB — COMPREHENSIVE METABOLIC PANEL
ALK PHOS: 139 U/L — AB (ref 39–117)
ALT: 20 U/L (ref 0–35)
AST: 28 U/L (ref 0–37)
Albumin: 3.4 g/dL — ABNORMAL LOW (ref 3.5–5.2)
BUN: 10 mg/dL (ref 6–23)
CALCIUM: 9.3 mg/dL (ref 8.4–10.5)
CO2: 26 mEq/L (ref 19–32)
CREATININE: 0.69 mg/dL (ref 0.40–1.20)
Chloride: 104 mEq/L (ref 96–112)
GFR: 87.54 mL/min (ref >60.00)
GLUCOSE: 234 mg/dL — AB (ref 70–99)
Potassium: 3.8 mEq/L (ref 3.5–5.1)
SODIUM: 138 meq/L (ref 135–145)
Total Bilirubin: 0.9 mg/dL (ref 0.2–1.2)
Total Protein: 6.4 g/dL (ref 6.0–8.3)

## 2015-04-24 LAB — CBC WITH DIFFERENTIAL/PLATELET
Basophils Absolute: 0 10*3/uL (ref 0.0–0.1)
Basophils Relative: 0.5 % (ref 0.0–3.0)
EOS ABS: 0.1 10*3/uL (ref 0.0–0.7)
Eosinophils Relative: 2.5 % (ref 0.0–5.0)
HEMATOCRIT: 39.2 % (ref 36.0–46.0)
Hemoglobin: 13.6 g/dL (ref 12.0–15.0)
LYMPHS ABS: 1.2 10*3/uL (ref 0.7–4.0)
LYMPHS PCT: 19.4 % (ref 12.0–46.0)
MCHC: 34.8 g/dL (ref 30.0–36.0)
MCV: 90.5 fl (ref 78.0–100.0)
MONO ABS: 0.6 10*3/uL (ref 0.1–1.0)
Monocytes Relative: 10.3 % (ref 3.0–12.0)
NEUTROS PCT: 67.3 % (ref 43.0–77.0)
Neutro Abs: 4 10*3/uL (ref 1.4–7.7)
PLATELETS: 154 10*3/uL (ref 150.0–400.0)
RBC: 4.33 Mil/uL (ref 3.87–5.11)
RDW: 13.1 % (ref 11.5–15.5)
WBC: 6 10*3/uL (ref 4.0–10.5)

## 2015-04-24 LAB — TSH: TSH: 2.66 u[IU]/mL (ref 0.35–4.50)

## 2015-04-24 LAB — LIPID PANEL
Cholesterol: 136 mg/dL (ref 0–200)
HDL: 43.9 mg/dL (ref >39.00)
LDL Cholesterol: 69 mg/dL (ref 0–99)
NONHDL: 92.1
Total CHOL/HDL Ratio: 3
Triglycerides: 115 mg/dL (ref 0.0–149.0)
VLDL: 23 mg/dL (ref 0.0–40.0)

## 2015-04-24 LAB — HEMOGLOBIN A1C: HEMOGLOBIN A1C: 10.2 % — AB (ref 4.6–6.5)

## 2015-04-26 DIAGNOSIS — D126 Benign neoplasm of colon, unspecified: Secondary | ICD-10-CM

## 2015-04-26 HISTORY — DX: Benign neoplasm of colon, unspecified: D12.6

## 2015-04-27 ENCOUNTER — Ambulatory Visit (INDEPENDENT_AMBULATORY_CARE_PROVIDER_SITE_OTHER): Payer: Medicare Other | Admitting: Family Medicine

## 2015-04-27 ENCOUNTER — Telehealth: Payer: Self-pay | Admitting: Family Medicine

## 2015-04-27 ENCOUNTER — Encounter: Payer: Self-pay | Admitting: Family Medicine

## 2015-04-27 ENCOUNTER — Encounter: Payer: Self-pay | Admitting: Physician Assistant

## 2015-04-27 VITALS — BP 110/60 | HR 87 | Temp 97.7°F | Resp 18 | Ht 64.0 in | Wt 211.0 lb

## 2015-04-27 DIAGNOSIS — M25551 Pain in right hip: Secondary | ICD-10-CM

## 2015-04-27 DIAGNOSIS — E669 Obesity, unspecified: Secondary | ICD-10-CM

## 2015-04-27 DIAGNOSIS — E119 Type 2 diabetes mellitus without complications: Secondary | ICD-10-CM

## 2015-04-27 DIAGNOSIS — E1169 Type 2 diabetes mellitus with other specified complication: Secondary | ICD-10-CM

## 2015-04-27 DIAGNOSIS — E1165 Type 2 diabetes mellitus with hyperglycemia: Secondary | ICD-10-CM | POA: Diagnosis not present

## 2015-04-27 DIAGNOSIS — E118 Type 2 diabetes mellitus with unspecified complications: Secondary | ICD-10-CM | POA: Diagnosis not present

## 2015-04-27 DIAGNOSIS — E782 Mixed hyperlipidemia: Secondary | ICD-10-CM

## 2015-04-27 DIAGNOSIS — K7460 Unspecified cirrhosis of liver: Secondary | ICD-10-CM | POA: Diagnosis not present

## 2015-04-27 DIAGNOSIS — K219 Gastro-esophageal reflux disease without esophagitis: Secondary | ICD-10-CM | POA: Diagnosis not present

## 2015-04-27 HISTORY — DX: Unspecified cirrhosis of liver: K74.60

## 2015-04-27 MED ORDER — OMEPRAZOLE 20 MG PO CPDR: 20.0000 mg | DELAYED_RELEASE_CAPSULE | Freq: Every day | ORAL | Status: DC

## 2015-04-27 MED ORDER — LISINOPRIL 5 MG PO TABS: ORAL_TABLET | ORAL | Status: DC

## 2015-04-27 MED ORDER — RANITIDINE HCL 300 MG PO TABS: 300.0000 mg | ORAL_TABLET | Freq: Every day | ORAL | Status: DC

## 2015-04-27 MED ORDER — GLUCOSE BLOOD VI STRP: ORAL_STRIP | Status: DC

## 2015-04-27 MED ORDER — LIRAGLUTIDE 18 MG/3ML ~~LOC~~ SOPN: 1.8000 mg | PEN_INJECTOR | Freq: Every day | SUBCUTANEOUS | Status: DC

## 2015-04-27 MED ORDER — METFORMIN HCL ER 500 MG PO TB24: ORAL_TABLET | ORAL | Status: DC

## 2015-04-27 NOTE — Telephone Encounter (Signed)
Pt was informed of colonoscopy date pt voice understanding and stated if there was anything else Robin needed to discuss please call back

## 2015-04-27 NOTE — Assessment & Plan Note (Signed)
Well controlled, no changes to meds. Encouraged heart healthy diet such as the DASH diet and exercise as tolerated.  °

## 2015-04-27 NOTE — Telephone Encounter (Signed)
Patients last colonoscopy was 2008. Called her to inform left message to call back.

## 2015-04-27 NOTE — Telephone Encounter (Signed)
Caller Name: Cameryn Schum  Relation to Patient: self Phone #: [phone]  Reason for Call: Pt at checkout asked about date of last colonoscopy. Please call her and advise of date. I believe she is thinking she needs to have another one done at some point. Thank you.

## 2015-04-27 NOTE — Progress Notes (Signed)
Pre visit review using our clinic review tool, if applicable. No additional management support is needed unless otherwise documented below in the visit note. 

## 2015-04-27 NOTE — Progress Notes (Signed)
Stacey Cooper  [id number] 1935-06-22 04/27/2015      Progress Note-Follow Up  Subjective  Chief Complaint  Chief Complaint  Patient presents with  . Follow-up    Diabetes    HPI  Patient is a 80 y.o. female in today for routine medical care. Patient is in today for follow-up. Reports her blood sugars are improving. Her sugars in the last week have ranged from 153, 2-11. She denies polyuria or polydipsia. She reports her right hip pain is improving now that she is under the care of Dr. Evette Doffing. No recent falls or injuries. No recent illness. Denies CP/palp/SOB/HA/congestion/fevers/GI or GU c/o. Taking meds as prescribed  Past Medical History  Diagnosis Date  . Diabetes mellitus   . UTI'S, HX OF 01/24/2011  . URINARY INCONTINENCE 02/07/2011  . SQUAMOUS CELL CARCINOMA OTHER SPEC SITES SKIN 01/24/2011  . NONSPEC ELEVATION OF LEVELS OF TRANSAMINASE/LDH 02/07/2011  . Morbid obesity 01/24/2011  . MEASLES, HX OF 01/24/2011  . HAIR LOSS 01/24/2011  . GERD 01/24/2011  . Essential hypertension, benign 01/24/2011  . DM 01/24/2011  . CHOLELITHIASIS, HX OF 02/07/2011  . Basal cell carcinoma of skin of lip 01/24/2011  . ABDOMINAL PAIN, RIGHT UPPER QUADRANT, HX OF 01/24/2011  . Peripheral neuropathy 04/20/2011  . SCC (squamous cell carcinoma), face 07/18/2011  . Actinic keratoses 07/18/2011  . Back pain 12/08/2011  . Hyperlipidemia 04/24/2012  . Insomnia 07/24/2012  . Bronchitis 12/20/2012  . Leg cramps 06/06/2013  . Hearing loss of both ears 06/28/2013  . Grief reaction 01/20/2014  . Urinary tract infection, site not specified 04/30/2014  . Diabetes mellitus type 2 in obese 01/24/2011    Qualifier: Diagnosis of  By: [name], [name]  Annual [facility] exam with [facility] on 06/14/13   . Hyperlipidemia, mixed 04/24/2012    Past Surgical History  Procedure Laterality Date  . Abdominal hysterectomy      partial, both ovaries left in place  . Total knee replacement, b/l    . Cataract extraction,  b/l    . Skin bx, right anterior cw , squamous cancer, had to redo to margins  12/2010  . Bcc removed below lower lip on left  26 yrs ago  . Left knee arthroscopy and cleaning prior to tkr      Family History  Problem Relation Age of Onset  . Arthritis Mother     RA  . Myelodysplastic syndrome Mother   . Cancer Father     skin  . Stroke Father   . Stroke Maternal Grandmother   . Kidney failure Maternal Grandfather     History   Social History  . Marital Status: Married    Spouse Name: N/A  . Number of Children: N/A  . Years of Education: N/A   Occupational History  . Not on file.   Social History Main Topics  . Smoking status: Never Smoker   . Smokeless tobacco: Never Used  . Alcohol Use: No  . Drug Use: No  . Sexual Activity: No   Other Topics Concern  . Not on file   Social History Narrative    Current Outpatient Prescriptions on File Prior to Visit  Medication Sig Dispense Refill  . glucose blood (FREESTYLE LITE) test strip Use as instructed 100 each 12  . Insulin Pen Needle (NOVOFINE) 32G X 6 MM MISC Use with Victoza 100 each 1  . Lancets MISC by Does not apply route. Check blood sugars qam and once daily prn     .  Liraglutide (VICTOZA) 18 MG/3ML SOPN Inject 1.2 mg into the skin daily. 9 mL 2  . lisinopril (PRINIVIL,ZESTRIL) 5 MG tablet TAKE 1 TABLET (5 MG TOTAL) BY MOUTH DAILY. 90 tablet 2  . lovastatin (MEVACOR) 20 MG tablet Take 1 tablet (20 mg total) by mouth at bedtime. 90 tablet 1  . metFORMIN (GLUCOPHAGE-XR) 500 MG 24 hr tablet 2 tabs in am and 1 tab in pm 270 tablet 1  . ranitidine (ZANTAC) 300 MG tablet Take 1 tablet (300 mg total) by mouth daily. 90 tablet 1   No current facility-administered medications on file prior to visit.    Allergies  Allergen Reactions  . Contrast Media [Iodinated Diagnostic Agents] Shortness Of Breath    Review of Systems  Review of Systems  Unable to perform ROS Constitutional: Negative for fever and  malaise/fatigue.  HENT: Negative for congestion.   Eyes: Negative for discharge.  Respiratory: Negative for shortness of breath.   Cardiovascular: Negative for chest pain, palpitations and leg swelling.  Gastrointestinal: Negative for nausea, abdominal pain and diarrhea.  Genitourinary: Negative for dysuria.  Musculoskeletal: Positive for joint pain. Negative for falls.  Skin: Negative for rash.  Neurological: Negative for loss of consciousness and headaches.  Endo/Heme/Allergies: Negative for polydipsia.  Psychiatric/Behavioral: Negative for depression and suicidal ideas. The patient is not nervous/anxious and does not have insomnia.     Objective  BP 110/60 mmHg  Pulse 87  Temp(Src) 97.7 F (36.5 C) (Oral)  Resp 18  Ht 5\' 4"  (1.626 m)  Wt 211 lb (95.709 kg)  BMI 36.20 kg/m2  SpO2 97%  Physical Exam  Physical Exam  Constitutional: She is oriented to person, place, and time and well-developed, well-nourished, and in no distress. No distress.  HENT:  Head: Normocephalic and atraumatic.  Eyes: Conjunctivae are normal.  Neck: Neck supple. No thyromegaly present.  Cardiovascular: Normal rate, regular rhythm and normal heart sounds.   No murmur heard. Pulmonary/Chest: Effort normal and breath sounds normal. She has no wheezes.  Abdominal: She exhibits no distension and no mass.  Musculoskeletal: She exhibits no edema.  Lymphadenopathy:    She has no cervical adenopathy.  Neurological: She is alert and oriented to person, place, and time.  Skin: Skin is warm and dry. No rash noted. She is not diaphoretic.  Psychiatric: Memory, affect and judgment normal.    Lab Results  Component Value Date   TSH 2.66 04/24/2015   Lab Results  Component Value Date   WBC 6.0 04/24/2015   HGB 13.6 04/24/2015   HCT 39.2 04/24/2015   MCV 90.5 04/24/2015   PLT 154.0 04/24/2015   Lab Results  Component Value Date   CREATININE 0.69 04/24/2015   BUN 10 04/24/2015   NA 138 04/24/2015    K 3.8 04/24/2015   CL 104 04/24/2015   CO2 26 04/24/2015   Lab Results  Component Value Date   ALT 20 04/24/2015   AST 28 04/24/2015   ALKPHOS 139* 04/24/2015   BILITOT 0.9 04/24/2015   Lab Results  Component Value Date   CHOL 136 04/24/2015   Lab Results  Component Value Date   HDL 43.90 04/24/2015   Lab Results  Component Value Date   LDLCALC 69 04/24/2015   Lab Results  Component Value Date   TRIG 115.0 04/24/2015   Lab Results  Component Value Date   CHOLHDL 3 04/24/2015     Assessment & Plan  ESSENTIAL HYPERTENSION, BENIGN Well controlled, no changes to meds. Encouraged heart  healthy diet such as the DASH diet and exercise as tolerated.    GERD Avoid offending foods, start probiotics. Do not eat large meals in late evening and consider raising head of bed. Still having some trouble with reflux qhs despite her qhs Ranitidine. Will try adding Omeprazole 20 mg daily and see if that helps, referred to GI   Diabetes mellitus type 2 in obese hgba1c unacceptable, minimize simple carbs. Increase exercise as tolerated. Continue current meds bjt increase Victoza to 1.8   MORBID OBESITY Encouraged DASH diet, decrease po intake and increase exercise as tolerated. Needs 7-8 hours of sleep nightly. Avoid trans fats, eat small, frequent meals every 4-5 hours with lean proteins, complex carbs and healthy fats. Minimize simple carbs, GMO foods.   Hyperlipidemia, mixed Tolerating statin, encouraged heart healthy diet, avoid trans fats, minimize simple carbs and saturated fats. Increase exercise as tolerated   Right hip pain Bursitis, following with Dr Novella Olive, improving

## 2015-04-27 NOTE — Patient Instructions (Addendum)
Meloxicam 15 mg tab is similar to Ibuprofen and ok to take once daily with food. Cannot take Ibuprofen the same day but can switch back to Ibuprofen the next day   Rel of rec colonoscopy Dr Lannette Donath office, [place]?  Needs phone number for [facility] she was already referred but did not make appt.  Bursitis Bursitis is a swelling and soreness (inflammation) of a fluid-filled sac (bursa) that overlies and protects a joint. It can be caused by injury, overuse of the joint, arthritis or infection. The joints most likely to be affected are the elbows, shoulders, hips and knees. HOME CARE INSTRUCTIONS   Apply ice to the affected area for 15-20 minutes each hour while awake for 2 days. Put the ice in a plastic bag and place a towel between the bag of ice and your skin.  Rest the injured joint as much as possible, but continue to put the joint through a full range of motion, 4 times per day. (The shoulder joint especially becomes rapidly "frozen" if not used.) When the pain lessens, begin normal slow movements and usual activities.  Only take over-the-counter or prescription medicines for pain, discomfort or fever as directed by your caregiver.  Your caregiver may recommend draining the bursa and injecting medicine into the bursa. This may help the healing process.  Follow all instructions for follow-up with your caregiver. This includes any orthopedic referrals, physical therapy and rehabilitation. Any delay in obtaining necessary care could result in a delay or failure of the bursitis to heal and chronic pain. SEEK IMMEDIATE MEDICAL CARE IF:   Your pain increases even during treatment.  You develop an oral temperature above 102 F (38.9 C) and have heat and inflammation over the involved bursa. MAKE SURE YOU:   Understand these instructions.  Will watch your condition.  Will get help right away if you are not doing well or get worse. Document Released: 12/09/2000 Document Revised:  03/05/2012 Document Reviewed: 03/03/2014 [facility] Patient Information 2015 [place], [place]. This information is not intended to replace advice given to you by your health care provider. Make sure you discuss any questions you have with your health care provider.

## 2015-04-27 NOTE — Assessment & Plan Note (Addendum)
Avoid offending foods, start probiotics. Do not eat large meals in late evening and consider raising head of bed. Still having some trouble with reflux qhs despite her qhs Ranitidine. Will try adding Omeprazole 20 mg daily and see if that helps, referred to GI

## 2015-05-03 ENCOUNTER — Encounter: Payer: Self-pay | Admitting: Family Medicine

## 2015-05-03 DIAGNOSIS — M25551 Pain in right hip: Secondary | ICD-10-CM

## 2015-05-03 DIAGNOSIS — M2550 Pain in unspecified joint: Secondary | ICD-10-CM | POA: Insufficient documentation

## 2015-05-03 HISTORY — DX: Pain in unspecified joint: M25.50

## 2015-05-03 HISTORY — DX: Pain in right hip: M25.551

## 2015-05-03 NOTE — Assessment & Plan Note (Signed)
Bursitis, following with Dr Novella Olive, improving

## 2015-05-03 NOTE — Assessment & Plan Note (Signed)
hgba1c unacceptable, minimize simple carbs. Increase exercise as tolerated. Continue current meds bjt increase Victoza to 1.8

## 2015-05-03 NOTE — Assessment & Plan Note (Signed)
Tolerating statin, encouraged heart healthy diet, avoid trans fats, minimize simple carbs and saturated fats. Increase exercise as tolerated 

## 2015-05-03 NOTE — Assessment & Plan Note (Signed)
Encouraged DASH diet, decrease po intake and increase exercise as tolerated. Needs 7-8 hours of sleep nightly. Avoid trans fats, eat small, frequent meals every 4-5 hours with lean proteins, complex carbs and healthy fats. Minimize simple carbs, GMO foods. 

## 2015-05-07 ENCOUNTER — Ambulatory Visit (INDEPENDENT_AMBULATORY_CARE_PROVIDER_SITE_OTHER): Payer: Medicare Other | Admitting: Physician Assistant

## 2015-05-07 ENCOUNTER — Encounter: Payer: Self-pay | Admitting: Physician Assistant

## 2015-05-07 ENCOUNTER — Other Ambulatory Visit (INDEPENDENT_AMBULATORY_CARE_PROVIDER_SITE_OTHER): Payer: Medicare Other

## 2015-05-07 VITALS — BP 110/68 | HR 82 | Ht 64.0 in | Wt 211.0 lb

## 2015-05-07 DIAGNOSIS — Z1211 Encounter for screening for malignant neoplasm of colon: Secondary | ICD-10-CM

## 2015-05-07 DIAGNOSIS — R935 Abnormal findings on diagnostic imaging of other abdominal regions, including retroperitoneum: Secondary | ICD-10-CM

## 2015-05-07 DIAGNOSIS — K7460 Unspecified cirrhosis of liver: Secondary | ICD-10-CM

## 2015-05-07 LAB — CBC WITH DIFFERENTIAL/PLATELET
BASOS ABS: 0.1 10*3/uL (ref 0.0–0.1)
Basophils Relative: 0.8 % (ref 0.0–3.0)
EOS PCT: 1 % (ref 0.0–5.0)
Eosinophils Absolute: 0.1 10*3/uL (ref 0.0–0.7)
HCT: 41.1 % (ref 36.0–46.0)
HEMOGLOBIN: 14.1 g/dL (ref 12.0–15.0)
LYMPHS ABS: 1.4 10*3/uL (ref 0.7–4.0)
Lymphocytes Relative: 15.2 % (ref 12.0–46.0)
MCHC: 34.2 g/dL (ref 30.0–36.0)
MCV: 90.6 fl (ref 78.0–100.0)
Monocytes Absolute: 0.8 10*3/uL (ref 0.1–1.0)
Monocytes Relative: 8.9 % (ref 3.0–12.0)
NEUTROS ABS: 6.8 10*3/uL (ref 1.4–7.7)
Neutrophils Relative %: 74.1 % (ref 43.0–77.0)
PLATELETS: 205 10*3/uL (ref 150.0–400.0)
RBC: 4.54 Mil/uL (ref 3.87–5.11)
RDW: 13.3 % (ref 11.5–15.5)
WBC: 9.2 10*3/uL (ref 4.0–10.5)

## 2015-05-07 LAB — PROTIME-INR
INR: 1.1 ratio — ABNORMAL HIGH (ref 0.8–1.0)
Prothrombin Time: 11.8 s (ref 9.6–13.1)

## 2015-05-07 LAB — COMPREHENSIVE METABOLIC PANEL
ALBUMIN: 3.7 g/dL (ref 3.5–5.2)
ALT: 21 U/L (ref 0–35)
AST: 36 U/L (ref 0–37)
Alkaline Phosphatase: 147 U/L — ABNORMAL HIGH (ref 39–117)
BUN: 8 mg/dL (ref 6–23)
CALCIUM: 10 mg/dL (ref 8.4–10.5)
CHLORIDE: 101 meq/L (ref 96–112)
CO2: 29 mEq/L (ref 19–32)
Creatinine, Ser: 0.69 mg/dL (ref 0.40–1.20)
GFR: 87.53 mL/min (ref >60.00)
Glucose, Bld: 232 mg/dL — ABNORMAL HIGH (ref 70–99)
POTASSIUM: 3.9 meq/L (ref 3.5–5.1)
Sodium: 136 mEq/L (ref 135–145)
Total Bilirubin: 0.8 mg/dL (ref 0.2–1.2)
Total Protein: 7.2 g/dL (ref 6.0–8.3)

## 2015-05-07 LAB — AMMONIA: AMMONIA: 24 umol/L (ref 11–35)

## 2015-05-07 MED ORDER — NA SULFATE-K SULFATE-MG SULF 17.5-3.13-1.6 GM/177ML PO SOLN: 1.0000 | Freq: Once | ORAL | Status: DC

## 2015-05-07 NOTE — Progress Notes (Signed)
Patient ID: Stacey Cooper, female   DOB: [birth year], 80 y.o.   MRN: [id number]   Subjective:    Patient ID: Stacey Cooper, female    DOB: [birth year], 80 y.o.   MRN: [id number]  HPI  Stacey Cooper is a very nice 80 year old white female new to GI today referred by Dr. Randel Pigg  for evaluation of new finding of cirrhosis on recent CT scan. Patient had had prior colon screening done by Dr. Vladimir Faster n 2005 and this was a normal exam. Patient has history of GERD, adult-onset diabetes mellitus, morbid obesity, hyperlipidemia and osteoarthritis. She had been having some mid abdominal discomfort recently and had seen Dr. Randel Pigg and subsequent CT was ordered. In retrospect the patient feels that her abdominal discomfort may have been musculoskeletal as she had onset after she had done a lot of yard work and trimming of shrubs  at her home. CT done 04/08/2015 showed a 2 cm gallstone, gallbladder wall not thickened, bile ducts nondilated and a cirrhotic liver with liver contour diffusely irregular with mild hypertrophy of the left lobe. There was evidence of portal hypertension with mild splenic and gastric varices, spleen not enlarged, pancreas normal ,portal vein  patent, no ascites. Most recent labs showed a glucose of 234 total bilirubin 0.9, AST 28 ALT of 20 cholesterol 136 triglycerides 1:15 WBC 6 hemoglobin 13.6 platelets 154. Review of previous labs show that she has had some minimal transaminase elevation in the past. Also reviewed prior ultrasound from 2012 which did not read any evidence of fatty liver or cirrhosis by report. Patient is upset today by her diagnosis and worried. She says she has been told in the past that she had mildly elevated liver enzymes and that she probably had fatty liver. She says she has never consumed  any alcohol. Her family history is negative for liver disease as far she is where. Patient also mentions that she wants to have another colonoscopy because her husband died from colon  cancer a couple of years ago.   Review of Systems Pertinent positive and negative review of systems were noted in the above HPI section.  All other review of systems was otherwise negative.  Outpatient Encounter Prescriptions as of 05/07/2015  Medication Sig  . glucose blood (FREESTYLE LITE) test strip Use as instructed  . Insulin Pen Needle (NOVOFINE) 32G X 6 MM MISC Use with Victoza  . Lancets MISC by Does not apply route. Check blood sugars qam and once daily prn   . Liraglutide (VICTOZA) 18 MG/3ML SOPN Inject 0.3 mLs (1.8 mg total) into the skin daily.  Marland Kitchen lisinopril (PRINIVIL,ZESTRIL) 5 MG tablet TAKE 1 TABLET (5 MG TOTAL) BY MOUTH DAILY.  Marland Kitchen lovastatin (MEVACOR) 20 MG tablet Take 1 tablet (20 mg total) by mouth at bedtime.  . metFORMIN (GLUCOPHAGE-XR) 500 MG 24 hr tablet TAKE 2 TABS IN AM AND PM AND 1 TAB AT NOON  . omeprazole (PRILOSEC) 20 MG capsule Take 1 capsule (20 mg total) by mouth daily.  . ranitidine (ZANTAC) 300 MG tablet Take 1 tablet (300 mg total) by mouth daily.  . Na Sulfate-K Sulfate-Mg Sulf SOLN Take 1 kit by mouth once.   No facility-administered encounter medications on file as of 05/07/2015.   Allergies  Allergen Reactions  . Contrast Media [Iodinated Diagnostic Agents] Shortness Of Breath   Patient Active Problem List   Diagnosis Date Noted  . Right hip pain 05/03/2015  . Hepatic cirrhosis 04/27/2015  . Hypokalemia 10/19/2014  . Dermatitis 06/28/2014  .  Vulvar candidiasis 04/30/2014  . Urinary tract infection, site not specified 04/30/2014  . Grief reaction 01/20/2014  . Medicare annual wellness visit, subsequent 06/28/2013  . Hearing loss of both ears 06/28/2013  . Leg cramps 06/06/2013  . Insomnia 07/24/2012  . Hyperlipidemia, mixed 04/24/2012  . Back pain 12/08/2011  . SCC (squamous cell carcinoma), face 07/18/2011  . Actinic keratoses 07/18/2011  . Peripheral neuropathy 04/20/2011  . URINARY INCONTINENCE 02/07/2011  . NONSPEC ELEVATION OF LEVELS  OF TRANSAMINASE/LDH 02/07/2011  . CHOLELITHIASIS, HX OF 02/07/2011  . BASAL CELL CARCINOMA OF SKIN OF LIP 01/24/2011  . Diabetes mellitus type 2 in obese 01/24/2011  . MORBID OBESITY 01/24/2011  . ESSENTIAL HYPERTENSION, BENIGN 01/24/2011  . GERD 01/24/2011  . HAIR LOSS 01/24/2011  . MEASLES, HX OF 01/24/2011  . UTI'S, HX OF 01/24/2011  . History of other specified conditions presenting hazards to health 01/24/2011   History   Social History  . Marital Status: Married    Spouse Name: N/A  . Number of Children: N/A  . Years of Education: N/A   Occupational History  . Not on file.   Social History Main Topics  . Smoking status: Never Smoker   . Smokeless tobacco: Never Used  . Alcohol Use: No  . Drug Use: No  . Sexual Activity: No   Other Topics Concern  . Not on file   Social History Narrative    Ms. Saintvil's family history includes Arthritis in her mother; Cancer in her father; Kidney failure in her maternal grandfather; Myelodysplastic syndrome in her mother; Stroke in her father and maternal grandmother.      Objective:    Filed Vitals:   05/07/15 1049  BP: 110/68  Pulse: 82    Physical Exam  well-developed elderly white female in no acute distress, quite pleasant blood pressure 110/68 pulse 82 height 5 foot 4 weight 211, BMI 36.2. HEENT; nontraumatic normocephalic EOMI PERRLA sclera anicteric neck supple no JVD, Cardiovascular; regular rate and rhythm with S1-S2 no murmur or gallop, Pulmonary ;clear bilaterally, Abdomen ;obese soft nondistended no fluid wave no palpable mass or hepatosplenomegaly, Bowel sounds are present, Rectal ;exam not done, Extremities; no clubbing cyanosis or edema skin warm and dry, Psych; mood and affect appropriate       Assessment & Plan:   #1 80 yo female  With new diagnosis of Cirrhosis . Etiology is not clear, no hx of ETOH, and interesting that [name] 2012 has no mention of fatty liver or cirrhosis. She may have Cirrhosis secondary  to steatosis , but will r/o other chronic forms of liver disease She appears fairly well compensated by  labs at present, INR is pending, however she does have evidence of varices.  #2 morbid obesity-BMI 18 #3 AODM #4 HTN #5 cholelithiasis #6GERD #7 colon screening- last colon 2005- normal  Plan; check chronic hepatitis serologies, and chronic hepatic markers, INR, AFP, ammonia.  Schedule for EGD for screening for varices and Colonoscopy with Dr. Fuller Plan- procedures discussed in detail with pt and she is agreeable to proceed. Pt aware thatif she has varices she will require banding  which will be done  at the hospital  Consider stopping Mevacor , Victoza also lists warning with hepatic disease Plan office follow up with Dr. Fuller Plan in 6 weeks.    Amy Genia Harold PA-C 05/07/2015   Cc: Mosie Lukes, MD

## 2015-05-07 NOTE — Progress Notes (Signed)
Reviewed and agree with management plan.  Malcolm T. Stark, MD FACG 

## 2015-05-07 NOTE — Patient Instructions (Signed)
Please go to the basement level to have your labs drawn.  You have been scheduled for an endoscopy and colonoscopy. Please follow the written instructions given to you at your visit today. Please pick up your prep supplies at the pharmacy within the next 1-3 days. [facility] [address], [place], [place]. If you use inhalers (even only as needed), please bring them with you on the day of your procedure. Your physician has requested that you go to www.startemmi.com and enter the access code given to you at your visit today. This web site gives a general overview about your procedure. However, you should still follow specific instructions given to you by our office regarding your preparation for the procedure.

## 2015-05-08 ENCOUNTER — Other Ambulatory Visit: Payer: Self-pay | Admitting: Gastroenterology

## 2015-05-08 ENCOUNTER — Other Ambulatory Visit: Payer: Self-pay | Admitting: Family Medicine

## 2015-05-08 LAB — AFP TUMOR MARKER: AFP TUMOR MARKER: 5.1 ng/mL (ref <6.1)

## 2015-05-08 LAB — HEPATITIS B SURFACE ANTIBODY,QUALITATIVE: Hep B S Ab: NEGATIVE

## 2015-05-08 LAB — ANA: Anti Nuclear Antibody(ANA): NEGATIVE

## 2015-05-08 LAB — HEPATITIS C ANTIBODY: HCV Ab: NEGATIVE

## 2015-05-11 ENCOUNTER — Encounter: Payer: Self-pay | Admitting: Gastroenterology

## 2015-05-11 ENCOUNTER — Ambulatory Visit (AMBULATORY_SURGERY_CENTER): Payer: Medicare Other | Admitting: Gastroenterology

## 2015-05-11 VITALS — BP 135/26 | HR 78 | Temp 97.1°F | Resp 18 | Ht 64.0 in | Wt 211.0 lb

## 2015-05-11 DIAGNOSIS — D122 Benign neoplasm of ascending colon: Secondary | ICD-10-CM

## 2015-05-11 DIAGNOSIS — K7460 Unspecified cirrhosis of liver: Secondary | ICD-10-CM | POA: Diagnosis present

## 2015-05-11 DIAGNOSIS — Z1211 Encounter for screening for malignant neoplasm of colon: Secondary | ICD-10-CM

## 2015-05-11 LAB — CERULOPLASMIN: Ceruloplasmin: 23 mg/dL (ref 18–53)

## 2015-05-11 LAB — GLUCOSE, CAPILLARY
GLUCOSE-CAPILLARY: 168 mg/dL — AB (ref 65–99)
Glucose-Capillary: 164 mg/dL — ABNORMAL HIGH (ref 65–99)

## 2015-05-11 LAB — ALPHA-1-ANTITRYPSIN: A1 ANTITRYPSIN SER: 167 mg/dL (ref 83–199)

## 2015-05-11 MED ORDER — SODIUM CHLORIDE 0.9 % IV SOLN: 500.0000 mL | INTRAVENOUS | Status: DC

## 2015-05-11 NOTE — Progress Notes (Signed)
Called to room to assist during endoscopic procedure.  Patient ID and intended procedure confirmed with present staff. Received instructions for my participation in the procedure from the performing physician.  

## 2015-05-11 NOTE — Progress Notes (Signed)
Stable to RR 

## 2015-05-11 NOTE — Patient Instructions (Signed)
YOU HAD AN ENDOSCOPIC PROCEDURE TODAY AT [facility]:   Refer to the procedure report that was given to you for any specific questions about what was found during the examination.  If the procedure report does not answer your questions, please call your gastroenterologist to clarify.  If you requested that your care partner not be given the details of your procedure findings, then the procedure report has been included in a sealed envelope for you to review at your convenience later.  YOU SHOULD EXPECT: Some feelings of bloating in the abdomen. Passage of more gas than usual.  Walking can help get rid of the air that was put into your GI tract during the procedure and reduce the bloating. If you had a lower endoscopy (such as a colonoscopy or flexible sigmoidoscopy) you may notice spotting of blood in your stool or on the toilet paper. If you underwent a bowel prep for your procedure, you may not have a normal bowel movement for a few days.  Please Note:  You might notice some irritation and congestion in your nose or some drainage.  This is from the oxygen used during your procedure.  There is no need for concern and it should clear up in a day or so.  SYMPTOMS TO REPORT IMMEDIATELY:   Following lower endoscopy (colonoscopy or flexible sigmoidoscopy):  Excessive amounts of blood in the stool  Significant tenderness or worsening of abdominal pains  Swelling of the abdomen that is new, acute  Fever of 100F or higher   Following upper endoscopy (EGD)  Vomiting of blood or coffee ground material  New chest pain or pain under the shoulder blades  Painful or persistently difficult swallowing  New shortness of breath  Fever of 100F or higher  Black, tarry-looking stools  For urgent or emergent issues, a gastroenterologist can be reached at any hour by calling [phone].   DIET: Your first meal following the procedure should be a small meal and then it is ok to progress to  your normal diet. Heavy or fried foods are harder to digest and may make you feel nauseous or bloated.  Likewise, meals heavy in dairy and vegetables can increase bloating.  Drink plenty of fluids but you should avoid alcoholic beverages for 24 hours. Try to increase the fiber in your diet.  ACTIVITY:  You should plan to take it easy for the rest of today and you should NOT DRIVE or use heavy machinery until tomorrow (because of the sedation medicines used during the test).    FOLLOW UP: Our staff will call the number listed on your records the next business day following your procedure to check on you and address any questions or concerns that you may have regarding the information given to you following your procedure. If we do not reach you, we will leave a message.  However, if you are feeling well and you are not experiencing any problems, there is no need to return our call.  We will assume that you have returned to your regular daily activities without incident.  If any biopsies were taken you will be contacted by phone or by letter within the next 1-3 weeks.  Please call us at [phone] if you have not heard about the biopsies in 3 weeks.    SIGNATURES/CONFIDENTIALITY: You and/or your care partner have signed paperwork which will be entered into your electronic medical record.  These signatures attest to the fact that that the information above  on your After Visit Summary has been reviewed and is understood.  Full responsibility of the confidentiality of this discharge information lies with you and/or your care-partner.  Hold all aspirin and NSAID products for two weeks due to increase risk of bleeding.

## 2015-05-11 NOTE — Op Note (Signed)
[place]  [address] [place], [id number]   COLONOSCOPY PROCEDURE REPORT  PATIENT: Stacey Cooper, Stacey Cooper  MR#: [id number] BIRTHDATE: Jan 09, 1935 , 79  yrs. old GENDER: female ENDOSCOPIST: Ladene Artist, MD, [facility] REFERRED QQ:[id number] Charlett Blake, M.D. PROCEDURE DATE:  05/11/2015 PROCEDURE:   Colonoscopy, screening and Colonoscopy with snare polypectomy First Screening Colonoscopy - Avg.  risk and is 50 yrs.  old or older - No.  Prior Negative Screening - Now for repeat screening. 10 or more years since last screening  History of Adenoma - Now for follow-up colonoscopy & has been > or = to 3 yrs.  N/A  Polyps removed today? Yes ASA CLASS:   Class III INDICATIONS:Screening for colonic neoplasia and Colorectal Neoplasm Risk Assessment for this procedure is average risk. MEDICATIONS: Monitored anesthesia care, Propofol 250 mg IV, and lidocaine 40 mg IV DESCRIPTION OF PROCEDURE:   After the risks benefits and alternatives of the procedure were thoroughly explained, informed consent was obtained.  The digital rectal exam revealed no abnormalities of the rectum.   The LB PCF Q180 [id number]  endoscope was introduced through the anus and advanced to the cecum, which was identified by both the appendix and ileocecal valve. No adverse events experienced with a tortuous and redundant colon.   The quality of the prep was excellent.  (Suprep was used)  The instrument was then slowly withdrawn as the colon was fully examined.  COLON FINDINGS: Two sessile polyps measuring 5-8 mm in size were found in the ascending colon.  Polypectomies were performed using snare cautery for the larger polyp and with a cold snare for the smaller.  The resection was complete, the polyp tissue was completely retrieved and sent to histology.  Two 6 mm angiodysplastic lesions were found in the transverse colon and ascending colon.   The examination was otherwise normal. Retroflexed views revealed external  hemorrhoids. The time to cecum = 1.8 Withdrawal time = 11.9   The scope was withdrawn and the procedure completed. COMPLICATIONS: There were no immediate complications.  ENDOSCOPIC IMPRESSION: 1.   Two sessile polyps in the ascending colon; polypectomies performed using snare cautery and with a cold snare 2.   Two angiodysplastic lesions in the transverse colon and ascending colon 3.   External hemorrhoids  RECOMMENDATIONS: 1.  Hold Aspirin and all other NSAIDS for 2 weeks. 2.  Await pathology results 3.  Given your age, you will not need another colonoscopy for colon cancer screening or polyp surveillance.  These types of tests usually stop around the age 12.  eSigned:  Ladene Artist, MD, [facility] 05/11/2015 9:42 AM

## 2015-05-11 NOTE — Op Note (Signed)
[place]  [address]. [place], [address]   ENDOSCOPY PROCEDURE REPORT  PATIENT: Stacey Cooper, Stacey Cooper  MR#: [id number] BIRTHDATE: January 01, 1935 , 79  yrs. old GENDER: female ENDOSCOPIST: Ladene Artist, MD, [facility] REFERRED BY:  Willette Alma, M.D. PROCEDURE DATE:  05/11/2015 PROCEDURE:  EGD, screening ASA CLASS:     Class III INDICATIONS:  screening for varices and cirrhosis. MEDICATIONS: Monitored anesthesia care and Propofol 50 mg IV TOPICAL ANESTHETIC: none DESCRIPTION OF PROCEDURE: After the risks benefits and alternatives of the procedure were thoroughly explained, informed consent was obtained.  The LB [id number] endoscope was introduced through the mouth and advanced to the second portion of the duodenum , Without limitations.  The instrument was slowly withdrawn as the mucosa was fully examined.    EXAM: The esophagus and gastroesophageal junction were completely normal in appearance.  The stomach was entered and closely examined. The antrum, angularis, and lesser curvature were well visualized, including a retroflexed view of the cardia and fundus. The stomach wall was normally distensable.  The scope passed easily through the pylorus into the duodenum.  Retroflexed views revealed no abnormalities.  No esophageal or gastric varices noted.  The scope was then withdrawn from the patient and the procedure completed.  COMPLICATIONS: There were no immediate complications.  ENDOSCOPIC IMPRESSION: 1.  Normal appearing EGD  RECOMMENDATIONS: 1.  Call to schedule a follow-up appointment with me in the office in 6 months 2.  Follow up with your PCP as planned  eSigned:  Ladene Artist, MD, [facility] 05/11/2015 9:49 AM

## 2015-05-12 ENCOUNTER — Telehealth: Payer: Self-pay

## 2015-05-12 LAB — MITOCHONDRIAL/SMOOTH MUSCLE AB PNL
Mitochondrial M2 Ab, IgG: 1.01 — ABNORMAL HIGH (ref <0.91)
Smooth Muscle Ab: 6 U (ref <20)

## 2015-05-12 NOTE — Telephone Encounter (Signed)
  Follow up Call-  Call back number 05/11/2015  Post procedure Call Back phone  # [phone] hm  Permission to leave phone message Yes     Patient questions:  Do you have a fever, pain , or abdominal swelling? No. Pain Score  0 *  Have you tolerated food without any problems? Yes.    Have you been able to return to your normal activities? Yes.    Do you have any questions about your discharge instructions: Diet   No. Medications  No. Follow up visit  No.  Do you have questions or concerns about your Care? No.  Actions: * If pain score is 4 or above: No action needed, pain <4.   I reminded the pt she needs to schedule an follow up appointment for 6 months.  Pt said she will call to set it up. No problems noted per the pt. maw

## 2015-05-15 ENCOUNTER — Encounter: Payer: Self-pay | Admitting: Gastroenterology

## 2015-05-16 NOTE — Progress Notes (Signed)
Patient not seen in clinic this day

## 2015-06-18 LAB — HM MAMMOGRAPHY

## 2015-06-22 LAB — HM DIABETES EYE EXAM

## 2015-06-26 ENCOUNTER — Encounter: Payer: Self-pay | Admitting: Family Medicine

## 2015-06-30 ENCOUNTER — Other Ambulatory Visit: Payer: Self-pay | Admitting: Family Medicine

## 2015-06-30 DIAGNOSIS — E118 Type 2 diabetes mellitus with unspecified complications: Secondary | ICD-10-CM

## 2015-06-30 MED ORDER — GLUCOSE BLOOD VI STRP: ORAL_STRIP | Status: DC

## 2015-07-27 ENCOUNTER — Other Ambulatory Visit (INDEPENDENT_AMBULATORY_CARE_PROVIDER_SITE_OTHER): Payer: Medicare Other

## 2015-07-27 DIAGNOSIS — E1165 Type 2 diabetes mellitus with hyperglycemia: Secondary | ICD-10-CM

## 2015-07-27 LAB — LIPID PANEL
CHOL/HDL RATIO: 4
Cholesterol: 148 mg/dL (ref 0–200)
HDL: 41.9 mg/dL (ref >39.00)
LDL Cholesterol: 80 mg/dL (ref 0–99)
NonHDL: 106.17
TRIGLYCERIDES: 133 mg/dL (ref 0.0–149.0)
VLDL: 26.6 mg/dL (ref 0.0–40.0)

## 2015-07-27 LAB — CBC
HCT: 39.5 % (ref 36.0–46.0)
Hemoglobin: 13.8 g/dL (ref 12.0–15.0)
MCHC: 34.8 g/dL (ref 30.0–36.0)
MCV: 92.2 fl (ref 78.0–100.0)
PLATELETS: 172 10*3/uL (ref 150.0–400.0)
RBC: 4.29 Mil/uL (ref 3.87–5.11)
RDW: 12.6 % (ref 11.5–15.5)
WBC: 7.6 10*3/uL (ref 4.0–10.5)

## 2015-07-27 LAB — COMPLETE METABOLIC PANEL WITH GFR
ALBUMIN: 3.6 g/dL (ref 3.6–5.1)
ALK PHOS: 115 U/L (ref 33–130)
ALT: 20 U/L (ref 6–29)
AST: 38 U/L — ABNORMAL HIGH (ref 10–35)
BILIRUBIN TOTAL: 0.8 mg/dL (ref 0.2–1.2)
BUN: 13 mg/dL (ref 7–25)
CALCIUM: 9.4 mg/dL (ref 8.6–10.4)
CO2: 25 mmol/L (ref 20–31)
CREATININE: 0.73 mg/dL (ref 0.60–0.93)
Chloride: 103 mmol/L (ref 98–110)
GFR, EST NON AFRICAN AMERICAN: 80 mL/min (ref >=60)
GFR, Est African American: >89 mL/min (ref >=60)
Glucose, Bld: 166 mg/dL — ABNORMAL HIGH (ref 65–99)
POTASSIUM: 4 mmol/L (ref 3.5–5.3)
Sodium: 137 mmol/L (ref 135–146)
TOTAL PROTEIN: 6.6 g/dL (ref 6.1–8.1)

## 2015-07-27 LAB — TSH: TSH: 3.14 u[IU]/mL (ref 0.35–4.50)

## 2015-07-27 LAB — HEMOGLOBIN A1C: HEMOGLOBIN A1C: 8.3 % — AB (ref 4.6–6.5)

## 2015-07-31 ENCOUNTER — Ambulatory Visit (INDEPENDENT_AMBULATORY_CARE_PROVIDER_SITE_OTHER): Payer: Medicare Other | Admitting: Family Medicine

## 2015-07-31 ENCOUNTER — Encounter: Payer: Self-pay | Admitting: Family Medicine

## 2015-07-31 VITALS — BP 110/70 | HR 80 | Temp 97.7°F | Ht 65.0 in | Wt 207.2 lb

## 2015-07-31 DIAGNOSIS — E1169 Type 2 diabetes mellitus with other specified complication: Secondary | ICD-10-CM

## 2015-07-31 DIAGNOSIS — I10 Essential (primary) hypertension: Secondary | ICD-10-CM

## 2015-07-31 DIAGNOSIS — K219 Gastro-esophageal reflux disease without esophagitis: Secondary | ICD-10-CM | POA: Diagnosis not present

## 2015-07-31 DIAGNOSIS — E119 Type 2 diabetes mellitus without complications: Secondary | ICD-10-CM

## 2015-07-31 DIAGNOSIS — E782 Mixed hyperlipidemia: Secondary | ICD-10-CM

## 2015-07-31 DIAGNOSIS — E669 Obesity, unspecified: Secondary | ICD-10-CM

## 2015-07-31 DIAGNOSIS — G629 Polyneuropathy, unspecified: Secondary | ICD-10-CM

## 2015-07-31 MED ORDER — GLIMEPIRIDE 1 MG PO TABS: 1.0000 mg | ORAL_TABLET | Freq: Every day | ORAL | Status: DC

## 2015-07-31 MED ORDER — RANITIDINE HCL 300 MG PO TABS: 300.0000 mg | ORAL_TABLET | Freq: Every day | ORAL | Status: DC

## 2015-07-31 NOTE — Assessment & Plan Note (Signed)
hgba1c unacceptable but improved, minimize simple carbs. Increase exercise as tolerated. Continue current meds but add Amaryl warned about low sugar, not to skip meals

## 2015-07-31 NOTE — Assessment & Plan Note (Signed)
Tolerating statin, encouraged heart healthy diet, avoid trans fats, minimize simple carbs and saturated fats. Increase exercise as tolerated 

## 2015-07-31 NOTE — Patient Instructions (Signed)

## 2015-07-31 NOTE — Assessment & Plan Note (Signed)
Discussed meds but declines for now, will let us know if symptoms worsen

## 2015-07-31 NOTE — Assessment & Plan Note (Signed)
Encouraged DASH diet, decrease po intake and increase exercise as tolerated. Needs 7-8 hours of sleep nightly. Avoid trans fats, eat small, frequent meals every 4-5 hours with lean proteins, complex carbs and healthy fats. Minimize simple carbs, GMO foods. 

## 2015-07-31 NOTE — Assessment & Plan Note (Signed)
Avoid offending foods, start probiotics. Do not eat large meals in late evening and consider raising head of bed.  

## 2015-07-31 NOTE — Progress Notes (Signed)
Stacey Cooper  [id number] 1935-07-20 07/31/2015      Progress Note-Follow Up  Subjective  Chief Complaint  Chief Complaint  Patient presents with  . Follow-up    HPI  Patient is a 80 y.o. female in today for routine medical care. Patient is in today for follow-upon diabetes. Reports low blood sugar was 108 in past month high was 171. Denies polyuria or polydipsia. Saw Dr. Herbert Deaner her ophthalmologist on 06/22/2015 no new diagnoses or concerns. Denies CP/palp/SOB/HA/congestion/fevers/GI or GU c/o. Taking meds as prescribed  Past Medical History  Diagnosis Date  . Diabetes mellitus   . UTI'S, HX OF 01/24/2011  . URINARY INCONTINENCE 02/07/2011  . SQUAMOUS CELL CARCINOMA OTHER SPEC SITES SKIN 01/24/2011  . NONSPEC ELEVATION OF LEVELS OF TRANSAMINASE/LDH 02/07/2011  . Morbid obesity 01/24/2011  . MEASLES, HX OF 01/24/2011  . HAIR LOSS 01/24/2011  . GERD 01/24/2011  . Essential hypertension, benign 01/24/2011  . DM 01/24/2011  . CHOLELITHIASIS, HX OF 02/07/2011  . Basal cell carcinoma of skin of lip 01/24/2011  . ABDOMINAL PAIN, RIGHT UPPER QUADRANT, HX OF 01/24/2011  . Peripheral neuropathy 04/20/2011  . SCC (squamous cell carcinoma), face 07/18/2011  . Actinic keratoses 07/18/2011  . Back pain 12/08/2011  . Hyperlipidemia 04/24/2012  . Insomnia 07/24/2012  . Bronchitis 12/20/2012  . Leg cramps 06/06/2013  . Hearing loss of both ears 06/28/2013  . Grief reaction 01/20/2014  . Urinary tract infection, site not specified 04/30/2014  . Diabetes mellitus type 2 in obese 01/24/2011    Qualifier: Diagnosis of  By: [name], [name]  Annual eye exam with [facility] on 06/14/13   . Hyperlipidemia, mixed 04/24/2012  . Hepatic cirrhosis 04/27/2015  . Right hip pain 05/03/2015  . Blood transfusion without reported diagnosis   . Cataract     bilataeral cateracts removed  . Chronic kidney disease     Past Surgical History  Procedure Laterality Date  . Abdominal hysterectomy      partial, both  ovaries left in place  . Total knee replacement, b/l    . Cataract extraction, b/l    . Skin bx, right anterior cw , squamous cancer, had to redo to margins  12/2010  . Bcc removed below lower lip on left  26 yrs ago  . Left knee arthroscopy and cleaning prior to tkr    . Colonoscopy      Family History  Problem Relation Age of Onset  . Arthritis Mother     RA  . Myelodysplastic syndrome Mother   . Cancer Father     skin  . Stroke Father   . Colon cancer Father     86 in larage intes removed carcinoid tumor confined in the tumor  . Stroke Maternal Grandmother   . Kidney failure Maternal Grandfather   . Esophageal cancer Neg Hx   . Rectal cancer Neg Hx   . Stomach cancer Neg Hx     History   Social History  . Marital Status: Married    Spouse Name: N/A  . Number of Children: N/A  . Years of Education: N/A   Occupational History  . Not on file.   Social History Main Topics  . Smoking status: Never Smoker   . Smokeless tobacco: Never Used  . Alcohol Use: No  . Drug Use: No  . Sexual Activity: No   Other Topics Concern  . Not on file   Social History Narrative    Current Outpatient Prescriptions on  File Prior to Visit  Medication Sig Dispense Refill  . glucose blood (FREESTYLE LITE) test strip Use as twice daily to check blood sugar.  DX E11.40 100 each 12  . Insulin Pen Needle (NOVOFINE) 32G X 6 MM MISC Use with Victoza 100 each 1  . Lancets MISC by Does not apply route. Check blood sugars qam and once daily prn     . Liraglutide (VICTOZA) 18 MG/3ML SOPN Inject 0.3 mLs (1.8 mg total) into the skin daily. 9 mL 3  . lisinopril (PRINIVIL,ZESTRIL) 5 MG tablet TAKE 1 TABLET (5 MG TOTAL) BY MOUTH DAILY. 90 tablet 2  . lovastatin (MEVACOR) 20 MG tablet Take 1 tablet (20 mg total) by mouth at bedtime. 90 tablet 1  . meloxicam (MOBIC) 15 MG tablet   0  . metFORMIN (GLUCOPHAGE-XR) 500 MG 24 hr tablet TAKE 2 TABS IN AM AND PM AND 1 TAB AT NOON 450 tablet 1  . omeprazole  (PRILOSEC) 20 MG capsule Take 1 capsule (20 mg total) by mouth daily. (Patient not taking: Reported on 07/31/2015) 30 capsule 0   No current facility-administered medications on file prior to visit.    Allergies  Allergen Reactions  . Contrast Media [Iodinated Diagnostic Agents] Shortness Of Breath    Review of Systems  Review of Systems  Constitutional: Negative for fever and malaise/fatigue.  HENT: Negative for congestion.   Eyes: Negative for discharge.  Respiratory: Negative for shortness of breath.   Cardiovascular: Negative for chest pain, palpitations and leg swelling.  Gastrointestinal: Negative for nausea, abdominal pain and diarrhea.  Genitourinary: Negative for dysuria.  Musculoskeletal: Negative for falls.  Skin: Negative for rash.  Neurological: Negative for loss of consciousness and headaches.  Endo/Heme/Allergies: Negative for polydipsia.  Psychiatric/Behavioral: Negative for depression and suicidal ideas. The patient is not nervous/anxious and does not have insomnia.     Objective  BP 110/70 mmHg  Pulse 80  Temp(Src) 97.7 F (36.5 C) (Oral)  Ht 5\' 5"  (1.651 m)  Wt 207 lb 4 oz (94.008 kg)  BMI 34.49 kg/m2  SpO2 97%  Physical Exam  Physical Exam  Constitutional: She is oriented to person, place, and time. No distress.  HENT:  Head: Normocephalic and atraumatic.  Eyes: Conjunctivae are normal.  Neck: Neck supple. No thyromegaly present.  Cardiovascular: Normal rate, regular rhythm and normal heart sounds.   No murmur heard. Pulmonary/Chest: Effort normal and breath sounds normal. She has no wheezes.  Abdominal: She exhibits no distension and no mass.  Musculoskeletal: She exhibits no edema.  Lymphadenopathy:    She has no cervical adenopathy.  Neurological: She is alert and oriented to person, place, and time.  Skin: Skin is warm and dry. No rash noted. She is not diaphoretic.  Psychiatric: Memory, affect and judgment normal.    Lab Results    Component Value Date   TSH 3.14 07/27/2015   Lab Results  Component Value Date   WBC 7.6 07/27/2015   HGB 13.8 07/27/2015   HCT 39.5 07/27/2015   MCV 92.2 07/27/2015   PLT 172.0 07/27/2015   Lab Results  Component Value Date   CREATININE 0.73 07/27/2015   BUN 13 07/27/2015   NA 137 07/27/2015   K 4.0 07/27/2015   CL 103 07/27/2015   CO2 25 07/27/2015   Lab Results  Component Value Date   ALT 20 07/27/2015   AST 38* 07/27/2015   ALKPHOS 115 07/27/2015   BILITOT 0.8 07/27/2015   Lab Results  Component Value  Date   CHOL 148 07/27/2015   Lab Results  Component Value Date   HDL 41.90 07/27/2015   Lab Results  Component Value Date   LDLCALC 80 07/27/2015   Lab Results  Component Value Date   TRIG 133.0 07/27/2015   Lab Results  Component Value Date   CHOLHDL 4 07/27/2015     Assessment & Plan  ESSENTIAL HYPERTENSION, BENIGN Well controlled, no changes to meds. Encouraged heart healthy diet such as the DASH diet and exercise as tolerated.   GERD Avoid offending foods, start probiotics. Do not eat large meals in late evening and consider raising head of bed.   MORBID OBESITY Encouraged DASH diet, decrease po intake and increase exercise as tolerated. Needs 7-8 hours of sleep nightly. Avoid trans fats, eat small, frequent meals every 4-5 hours with lean proteins, complex carbs and healthy fats. Minimize simple carbs, GMO foods.  Hyperlipidemia, mixed Tolerating statin, encouraged heart healthy diet, avoid trans fats, minimize simple carbs and saturated fats. Increase exercise as tolerated  Diabetes mellitus type 2 in obese hgba1c unacceptable but improved, minimize simple carbs. Increase exercise as tolerated. Continue current meds but add Amaryl warned about low sugar, not to skip meals  Peripheral neuropathy Discussed meds but declines for now, will let us know if symptoms worsen

## 2015-07-31 NOTE — Assessment & Plan Note (Signed)
Well controlled, no changes to meds. Encouraged heart healthy diet such as the DASH diet and exercise as tolerated.  °

## 2015-07-31 NOTE — Progress Notes (Signed)
Pre visit review using our clinic review tool, if applicable. No additional management support is needed unless otherwise documented below in the visit note. 

## 2015-09-07 ENCOUNTER — Other Ambulatory Visit: Payer: Self-pay | Admitting: Family Medicine

## 2015-09-21 ENCOUNTER — Other Ambulatory Visit: Payer: Self-pay | Admitting: Family Medicine

## 2015-10-30 ENCOUNTER — Other Ambulatory Visit: Payer: Self-pay | Admitting: Family Medicine

## 2015-11-05 ENCOUNTER — Encounter (INDEPENDENT_AMBULATORY_CARE_PROVIDER_SITE_OTHER): Payer: Self-pay

## 2015-11-05 ENCOUNTER — Other Ambulatory Visit (INDEPENDENT_AMBULATORY_CARE_PROVIDER_SITE_OTHER): Payer: Medicare Other

## 2015-11-05 DIAGNOSIS — E782 Mixed hyperlipidemia: Secondary | ICD-10-CM | POA: Diagnosis not present

## 2015-11-05 DIAGNOSIS — K219 Gastro-esophageal reflux disease without esophagitis: Secondary | ICD-10-CM

## 2015-11-05 DIAGNOSIS — I10 Essential (primary) hypertension: Secondary | ICD-10-CM

## 2015-11-05 LAB — LIPID PANEL
CHOL/HDL RATIO: 3
Cholesterol: 123 mg/dL (ref 0–200)
HDL: 37.8 mg/dL — ABNORMAL LOW (ref >39.00)
LDL Cholesterol: 61 mg/dL (ref 0–99)
NONHDL: 85.27
Triglycerides: 119 mg/dL (ref 0.0–149.0)
VLDL: 23.8 mg/dL (ref 0.0–40.0)

## 2015-11-05 LAB — HEMOGLOBIN A1C: Hgb A1c MFr Bld: 6.3 % (ref 4.6–6.5)

## 2015-11-05 LAB — COMPREHENSIVE METABOLIC PANEL
ALK PHOS: 112 U/L (ref 39–117)
ALT: 16 U/L (ref 0–35)
AST: 30 U/L (ref 0–37)
Albumin: 3.7 g/dL (ref 3.5–5.2)
BILIRUBIN TOTAL: 0.6 mg/dL (ref 0.2–1.2)
BUN: 13 mg/dL (ref 6–23)
CALCIUM: 9.6 mg/dL (ref 8.4–10.5)
CO2: 26 meq/L (ref 19–32)
CREATININE: 0.84 mg/dL (ref 0.40–1.20)
Chloride: 104 mEq/L (ref 96–112)
GFR: 69.67 mL/min (ref >60.00)
GLUCOSE: 113 mg/dL — AB (ref 70–99)
Potassium: 3.8 mEq/L (ref 3.5–5.1)
Sodium: 139 mEq/L (ref 135–145)
TOTAL PROTEIN: 6.8 g/dL (ref 6.0–8.3)

## 2015-11-05 LAB — CBC
HCT: 41 % (ref 36.0–46.0)
HEMOGLOBIN: 13.8 g/dL (ref 12.0–15.0)
MCHC: 33.6 g/dL (ref 30.0–36.0)
MCV: 93.3 fl (ref 78.0–100.0)
PLATELETS: 193 10*3/uL (ref 150.0–400.0)
RBC: 4.39 Mil/uL (ref 3.87–5.11)
RDW: 13.1 % (ref 11.5–15.5)
WBC: 8.3 10*3/uL (ref 4.0–10.5)

## 2015-11-05 LAB — TSH: TSH: 3.27 u[IU]/mL (ref 0.35–4.50)

## 2015-11-12 ENCOUNTER — Ambulatory Visit (INDEPENDENT_AMBULATORY_CARE_PROVIDER_SITE_OTHER): Payer: Medicare Other | Admitting: Family Medicine

## 2015-11-12 ENCOUNTER — Encounter: Payer: Self-pay | Admitting: Family Medicine

## 2015-11-12 ENCOUNTER — Ambulatory Visit (HOSPITAL_BASED_OUTPATIENT_CLINIC_OR_DEPARTMENT_OTHER)
Admission: RE | Admit: 2015-11-12 | Discharge: 2015-11-12 | Disposition: A | Discharge status: Home / Self Care | Payer: Medicare Other | Source: Ambulatory Visit | Attending: Family Medicine | Admitting: Family Medicine

## 2015-11-12 VITALS — BP 120/70 | HR 75 | Temp 97.5°F | Ht 65.0 in | Wt 214.0 lb

## 2015-11-12 DIAGNOSIS — M25532 Pain in left wrist: Secondary | ICD-10-CM

## 2015-11-12 DIAGNOSIS — I10 Essential (primary) hypertension: Secondary | ICD-10-CM

## 2015-11-12 DIAGNOSIS — M19032 Primary osteoarthritis, left wrist: Secondary | ICD-10-CM | POA: Diagnosis not present

## 2015-11-12 DIAGNOSIS — Z23 Encounter for immunization: Secondary | ICD-10-CM | POA: Diagnosis not present

## 2015-11-12 DIAGNOSIS — E669 Obesity, unspecified: Secondary | ICD-10-CM

## 2015-11-12 DIAGNOSIS — E1169 Type 2 diabetes mellitus with other specified complication: Secondary | ICD-10-CM

## 2015-11-12 DIAGNOSIS — G6289 Other specified polyneuropathies: Secondary | ICD-10-CM | POA: Diagnosis not present

## 2015-11-12 DIAGNOSIS — M7989 Other specified soft tissue disorders: Secondary | ICD-10-CM | POA: Insufficient documentation

## 2015-11-12 DIAGNOSIS — E782 Mixed hyperlipidemia: Secondary | ICD-10-CM

## 2015-11-12 DIAGNOSIS — Z780 Asymptomatic menopausal state: Secondary | ICD-10-CM

## 2015-11-12 DIAGNOSIS — E119 Type 2 diabetes mellitus without complications: Secondary | ICD-10-CM

## 2015-11-12 LAB — MICROALBUMIN / CREATININE URINE RATIO
CREATININE, U: 126.1 mg/dL
Microalb Creat Ratio: 0.6 mg/g (ref 0.0–30.0)

## 2015-11-12 MED ORDER — METFORMIN HCL ER 500 MG PO TB24: ORAL_TABLET | ORAL | Status: DC

## 2015-11-12 MED ORDER — ZOSTER VACCINE LIVE 19400 UNT/0.65ML ~~LOC~~ SOLR: 0.6500 mL | Freq: Once | SUBCUTANEOUS | Status: DC

## 2015-11-12 MED ORDER — ROPINIROLE HCL 0.5 MG PO TABS: 0.5000 mg | ORAL_TABLET | Freq: Every day | ORAL | Status: DC

## 2015-11-12 MED ORDER — LOVASTATIN 20 MG PO TABS: 10.0000 mg | ORAL_TABLET | Freq: Every day | ORAL | Status: DC

## 2015-11-12 NOTE — Patient Instructions (Signed)
Wait 30 days to get Zostavax  Basic Carbohydrate Counting for Diabetes Mellitus Carbohydrate counting is a method for keeping track of the amount of carbohydrates you eat. Eating carbohydrates naturally increases the level of sugar (glucose) in your blood, so it is important for you to know the amount that is okay for you to have in every meal. Carbohydrate counting helps keep the level of glucose in your blood within normal limits. The amount of carbohydrates allowed is different for every person. A dietitian can help you calculate the amount that is right for you. Once you know the amount of carbohydrates you can have, you can count the carbohydrates in the foods you want to eat. Carbohydrates are found in the following foods:  Grains, such as breads and cereals.  Dried beans and soy products.  Starchy vegetables, such as potatoes, peas, and corn.  Fruit and fruit juices.  Milk and yogurt.  Sweets and snack foods, such as cake, cookies, candy, chips, soft drinks, and fruit drinks. CARBOHYDRATE COUNTING There are two ways to count the carbohydrates in your food. You can use either of the methods or a combination of both. Reading the "Nutrition Facts" on [place] The "Nutrition Facts" is an area that is included on the labels of almost all packaged food and beverages in the [place]. It includes the serving size of that food or beverage and information about the nutrients in each serving of the food, including the grams (g) of carbohydrate per serving.  Decide the number of servings of this food or beverage that you will be able to eat or drink. Multiply that number of servings by the number of grams of carbohydrate that is listed on the label for that serving. The total will be the amount of carbohydrates you will be having when you eat or drink this food or beverage. Learning Standard Serving Sizes of Food When you eat food that is not packaged or does not include "Nutrition Facts"  on the label, you need to measure the servings in order to count the amount of carbohydrates.A serving of most carbohydrate-rich foods contains about 15 g of carbohydrates. The following list includes serving sizes of carbohydrate-rich foods that provide 15 g ofcarbohydrate per serving:   1 slice of bread (1 oz) or 1 six-inch tortilla.    of a hamburger bun or English muffin.  4-6 crackers.   cup unsweetened dry cereal.    cup hot cereal.   cup rice or pasta.    cup mashed potatoes or  of a large baked potato.  1 cup fresh fruit or one small piece of fruit.    cup canned or frozen fruit or fruit juice.  1 cup milk.   cup plain fat-free yogurt or yogurt sweetened with artificial sweeteners.   cup cooked dried beans or starchy vegetable, such as peas, corn, or potatoes.  Decide the number of standard-size servings that you will eat. Multiply that number of servings by 15 (the grams of carbohydrates in that serving). For example, if you eat 2 cups of strawberries, you will have eaten 2 servings and 30 g of carbohydrates (2 servings x 15 g = 30 g). For foods such as soups and casseroles, in which more than one food is mixed in, you will need to count the carbohydrates in each food that is included. EXAMPLE OF CARBOHYDRATE COUNTING Sample Dinner  3 oz chicken breast.   cup of brown rice.   cup of corn.  1 cup  milk.   1 cup strawberries with sugar-free whipped topping.  Carbohydrate Calculation Step 1: Identify the foods that contain carbohydrates:   Rice.   Corn.   Milk.   Strawberries. Step 2:Calculate the number of servings eaten of each:   2 servings of rice.   1 serving of corn.   1 serving of milk.   1 serving of strawberries. Step 3: Multiply each of those number of servings by 15 g:   2 servings of rice x 15 g = 30 g.   1 serving of corn x 15 g = 15 g.   1 serving of milk x 15 g = 15 g.   1 serving of strawberries x 15  g = 15 g. Step 4: Add together all of the amounts to find the total grams of carbohydrates eaten: 30 g + 15 g + 15 g + 15 g = 75 g.   This information is not intended to replace advice given to you by your health care provider. Make sure you discuss any questions you have with your health care provider.   Document Released: 12/12/2005 Document Revised: 01/02/2015 Document Reviewed: 11/08/2013 Elsevier Interactive Patient Education [employer].

## 2015-11-12 NOTE — Progress Notes (Signed)
Pre visit review using our clinic review tool, if applicable. No additional management support is needed unless otherwise documented below in the visit note. 

## 2015-11-13 ENCOUNTER — Other Ambulatory Visit: Payer: Self-pay | Admitting: Family Medicine

## 2015-11-13 DIAGNOSIS — M25532 Pain in left wrist: Secondary | ICD-10-CM

## 2015-11-20 DIAGNOSIS — Z780 Asymptomatic menopausal state: Secondary | ICD-10-CM | POA: Insufficient documentation

## 2015-11-20 DIAGNOSIS — M25532 Pain in left wrist: Secondary | ICD-10-CM | POA: Insufficient documentation

## 2015-11-20 NOTE — Assessment & Plan Note (Signed)
Xray reveals severe arthritis but no acute concern. Encouraged to stay active and try topical treatments report if pain worsens

## 2015-11-20 NOTE — Assessment & Plan Note (Signed)
hgba1c acceptable, minimize simple carbs. Increase exercise as tolerated. Continue current meds. Good improvement since last blood draw

## 2015-11-20 NOTE — Progress Notes (Signed)
Subjective:    Patient ID: Stacey Cooper, female    DOB: 23-Mar-1935, 80 y.o.   MRN: [id number]  Chief Complaint  Patient presents with  . Follow-up    3 month    HPI Patient is in today for follow-up. She is noting significant increase in left wrist pain but denies any falls or injury. No numbness or tingling. Describes stiffness and at times very severe pain. No recent illness. Continues to struggle with some anhedonia. No suicidal ideation. Denies CP/palp/SOB/HA/congestion/fevers/GI or GU c/o. Taking meds as prescribed  Past Medical History  Diagnosis Date  . Diabetes mellitus   . UTI'S, HX OF 01/24/2011  . URINARY INCONTINENCE 02/07/2011  . SQUAMOUS CELL CARCINOMA OTHER SPEC SITES SKIN 01/24/2011  . NONSPEC ELEVATION OF LEVELS OF TRANSAMINASE/LDH 02/07/2011  . Morbid obesity ([place]) 01/24/2011  . MEASLES, HX OF 01/24/2011  . HAIR LOSS 01/24/2011  . GERD 01/24/2011  . Essential hypertension, benign 01/24/2011  . DM 01/24/2011  . CHOLELITHIASIS, HX OF 02/07/2011  . Basal cell carcinoma of skin of lip 01/24/2011  . ABDOMINAL PAIN, RIGHT UPPER QUADRANT, HX OF 01/24/2011  . Peripheral neuropathy ([place]) 04/20/2011  . SCC (squamous cell carcinoma), face 07/18/2011  . Actinic keratoses 07/18/2011  . Back pain 12/08/2011  . Hyperlipidemia 04/24/2012  . Insomnia 07/24/2012  . Bronchitis 12/20/2012  . Leg cramps 06/06/2013  . Hearing loss of both ears 06/28/2013  . Grief reaction 01/20/2014  . Urinary tract infection, site not specified 04/30/2014  . Diabetes mellitus type 2 in obese [facility]) 01/24/2011    Qualifier: Diagnosis of  By: [name], [name]  Annual eye exam with [facility] on 06/14/13   . Hyperlipidemia, mixed 04/24/2012  . Hepatic cirrhosis ([place]) 04/27/2015  . Right hip pain 05/03/2015  . Blood transfusion without reported diagnosis   . Cataract     bilataeral cateracts removed  . Chronic kidney disease     Past Surgical History  Procedure Laterality Date  . Abdominal hysterectomy        partial, both ovaries left in place  . Total knee replacement, b/l    . Cataract extraction, b/l    . Skin bx, right anterior cw , squamous cancer, had to redo to margins  12/2010  . Bcc removed below lower lip on left  26 yrs ago  . Left knee arthroscopy and cleaning prior to tkr    . Colonoscopy      Family History  Problem Relation Age of Onset  . Arthritis Mother     RA  . Myelodysplastic syndrome Mother   . Cancer Father     skin  . Stroke Father   . Colon cancer Father     73 in larage intes removed carcinoid tumor confined in the tumor  . Stroke Maternal Grandmother   . Kidney failure Maternal Grandfather   . Esophageal cancer Neg Hx   . Rectal cancer Neg Hx   . Stomach cancer Neg Hx     Social History   Social History  . Marital Status: Married    Spouse Name: N/A  . Number of Children: N/A  . Years of Education: N/A   Occupational History  . Not on file.   Social History Main Topics  . Smoking status: Never Smoker   . Smokeless tobacco: Never Used  . Alcohol Use: No  . Drug Use: No  . Sexual Activity: No   Other Topics Concern  . Not on file   Social  History Narrative    Outpatient Prescriptions Prior to Visit  Medication Sig Dispense Refill  . glimepiride (AMARYL) 1 MG tablet Take 1 tablet (1 mg total) by mouth daily with breakfast. 90 tablet 1  . glucose blood (FREESTYLE LITE) test strip Use as twice daily to check blood sugar.  DX E11.40 100 each 12  . Insulin Pen Needle (NOVOFINE) 32G X 6 MM MISC Use with Victoza 100 each 1  . Lancets MISC by Does not apply route. Check blood sugars qam and once daily prn     . Liraglutide (VICTOZA) 18 MG/3ML SOPN Inject 0.3 mLs (1.8 mg total) into the skin daily. 9 mL 3  . lisinopril (PRINIVIL,ZESTRIL) 5 MG tablet TAKE 1 TABLET (5 MG TOTAL) BY MOUTH DAILY. 90 tablet 2  . omeprazole (PRILOSEC) 20 MG capsule TAKE 1 CAPSULE (20 MG TOTAL) BY MOUTH DAILY. 30 capsule 5  . lovastatin (MEVACOR) 20 MG tablet TAKE 1  TABLET (20 MG TOTAL) BY MOUTH AT BEDTIME. 90 tablet 1  . metFORMIN (GLUCOPHAGE-XR) 500 MG 24 hr tablet TAKE 2 TABS IN AM AND PM AND 1 TAB AT NOON (Patient taking differently: TAKE 1 TAB IN AM AND PM AND 2 TAB AT NOON) 450 tablet 1  . meloxicam (MOBIC) 15 MG tablet   0  . metFORMIN (GLUCOPHAGE-XR) 500 MG 24 hr tablet TAKE 2 TABS IN AM AND 1 TAB IN PM 270 tablet 1  . ranitidine (ZANTAC) 300 MG tablet Take 1 tablet (300 mg total) by mouth daily. 90 tablet 1   No facility-administered medications prior to visit.    Allergies  Allergen Reactions  . Contrast Media [Iodinated Diagnostic Agents] Shortness Of Breath    Review of Systems  Constitutional: Positive for malaise/fatigue. Negative for fever and chills.  HENT: Negative for congestion and hearing loss.   Eyes: Negative for discharge.  Respiratory: Negative for cough, sputum production and shortness of breath.   Cardiovascular: Negative for chest pain, palpitations and leg swelling.  Gastrointestinal: Negative for heartburn, nausea, vomiting, abdominal pain, diarrhea, constipation and blood in stool.  Genitourinary: Negative for dysuria, urgency, frequency and hematuria.  Musculoskeletal: Positive for joint pain. Negative for myalgias, back pain and falls.  Skin: Negative for rash.  Neurological: Negative for dizziness, sensory change, loss of consciousness, weakness and headaches.  Endo/Heme/Allergies: Negative for environmental allergies. Does not bruise/bleed easily.  Psychiatric/Behavioral: Negative for depression and suicidal ideas. The patient is nervous/anxious. The patient does not have insomnia.        Objective:    Physical Exam  Constitutional: She is oriented to person, place, and time. She appears well-developed and well-nourished. No distress.  HENT:  Head: Normocephalic and atraumatic.  Eyes: Conjunctivae are normal.  Neck: Neck supple. No thyromegaly present.  Cardiovascular: Normal rate, regular rhythm and normal  heart sounds.   No murmur heard. Pulmonary/Chest: Effort normal and breath sounds normal. No respiratory distress.  Abdominal: Soft. Bowel sounds are normal. She exhibits no distension and no mass. There is no tenderness.  Musculoskeletal: She exhibits no edema.  Lymphadenopathy:    She has no cervical adenopathy.  Neurological: She is alert and oriented to person, place, and time.  Skin: Skin is warm and dry.  Psychiatric: She has a normal mood and affect. Her behavior is normal.    BP 120/70 mmHg  Pulse 75  Temp(Src) 97.5 F (36.4 C) (Oral)  Ht 5\' 5"  (1.651 m)  Wt 214 lb (97.07 kg)  BMI 35.61 kg/m2  SpO2 98%  Wt Readings from Last 3 Encounters:  11/12/15 214 lb (97.07 kg)  07/31/15 207 lb 4 oz (94.008 kg)  05/11/15 211 lb (95.709 kg)     Lab Results  Component Value Date   WBC 8.3 11/05/2015   HGB 13.8 11/05/2015   HCT 41.0 11/05/2015   PLT 193.0 11/05/2015   GLUCOSE 113* 11/05/2015   CHOL 123 11/05/2015   TRIG 119.0 11/05/2015   HDL 37.80* 11/05/2015   LDLCALC 61 11/05/2015   ALT 16 11/05/2015   AST 30 11/05/2015   NA 139 11/05/2015   K 3.8 11/05/2015   CL 104 11/05/2015   CREATININE 0.84 11/05/2015   BUN 13 11/05/2015   CO2 26 11/05/2015   TSH 3.27 11/05/2015   INR 1.1* 05/07/2015   HGBA1C 6.3 11/05/2015   MICROALBUR <0.7 11/12/2015    Lab Results  Component Value Date   TSH 3.27 11/05/2015   Lab Results  Component Value Date   WBC 8.3 11/05/2015   HGB 13.8 11/05/2015   HCT 41.0 11/05/2015   MCV 93.3 11/05/2015   PLT 193.0 11/05/2015   Lab Results  Component Value Date   NA 139 11/05/2015   K 3.8 11/05/2015   CO2 26 11/05/2015   GLUCOSE 113* 11/05/2015   BUN 13 11/05/2015   CREATININE 0.84 11/05/2015   BILITOT 0.6 11/05/2015   ALKPHOS 112 11/05/2015   AST 30 11/05/2015   ALT 16 11/05/2015   PROT 6.8 11/05/2015   ALBUMIN 3.7 11/05/2015   CALCIUM 9.6 11/05/2015   GFR 69.67 11/05/2015   Lab Results  Component Value Date   CHOL 123  11/05/2015   Lab Results  Component Value Date   HDL 37.80* 11/05/2015   Lab Results  Component Value Date   LDLCALC 61 11/05/2015   Lab Results  Component Value Date   TRIG 119.0 11/05/2015   Lab Results  Component Value Date   CHOLHDL 3 11/05/2015   Lab Results  Component Value Date   HGBA1C 6.3 11/05/2015       Assessment & Plan:   Problem List Items Addressed This Visit    Diabetes mellitus type 2 in obese ([place])    hgba1c acceptable, minimize simple carbs. Increase exercise as tolerated. Continue current meds. Good improvement since last blood draw      Relevant Medications   lovastatin (MEVACOR) 20 MG tablet   metFORMIN (GLUCOPHAGE-XR) 500 MG 24 hr tablet   Other Relevant Orders   TSH   CBC   Lipid panel   Hemoglobin A1c   Comprehensive metabolic panel   Microalbumin / creatinine urine ratio (Completed)   ESSENTIAL HYPERTENSION, BENIGN    Well controlled, no changes to meds. Encouraged heart healthy diet such as the DASH diet and exercise as tolerated.       Relevant Medications   lovastatin (MEVACOR) 20 MG tablet   Other Relevant Orders   TSH   CBC   Lipid panel   Hemoglobin A1c   Comprehensive metabolic panel   Hyperlipidemia, mixed    Encouraged heart healthy diet, increase exercise, avoid trans fats, consider a krill oil cap daily      Relevant Medications   lovastatin (MEVACOR) 20 MG tablet   Other Relevant Orders   TSH   CBC   Lipid panel   Hemoglobin A1c   Comprehensive metabolic panel   Left wrist pain    Xray reveals severe arthritis but no acute concern. Encouraged to stay active and try topical treatments report if  pain worsens      Relevant Orders   DG Wrist Complete Left (Completed)   MORBID OBESITY    Encouraged DASH diet, decrease po intake and increase exercise as tolerated. Needs 7-8 hours of sleep nightly. Avoid trans fats, eat small, frequent meals every 4-5 hours with lean proteins, complex carbs and healthy fats.  Minimize simple carbs, GMO foods.      Relevant Medications   metFORMIN (GLUCOPHAGE-XR) 500 MG 24 hr tablet   Other Relevant Orders   TSH   CBC   Lipid panel   Hemoglobin A1c   Comprehensive metabolic panel   Peripheral neuropathy (HCC)   Relevant Medications   rOPINIRole (REQUIP) 0.5 MG tablet   Other Relevant Orders   TSH   CBC   Lipid panel   Hemoglobin A1c   Comprehensive metabolic panel   Postmenopausal estrogen deficiency   Relevant Orders   DG Bone Density    Other Visit Diagnoses    Encounter for immunization    -  Primary    Need for 23-polyvalent pneumococcal polysaccharide vaccine        Relevant Orders    Pneumococcal polysaccharide vaccine 23-valent greater than or equal to 2yo subcutaneous/IM (Completed)       I have discontinued Ms. Musser meloxicam, ranitidine, and metFORMIN. I have also changed her lovastatin and metFORMIN. Additionally, I am having her start on rOPINIRole and zoster vaccine live (PF). Lastly, I am having her maintain her Lancets, Insulin Pen Needle, lisinopril, Liraglutide, glucose blood, glimepiride, and omeprazole.  Meds ordered this encounter  Medications  . rOPINIRole (REQUIP) 0.5 MG tablet    Sig: Take 1-2 tablets (0.5-1 mg total) by mouth at bedtime.    Dispense:  60 tablet    Refill:  5  . zoster vaccine live, PF, (ZOSTAVAX) 53664 UNT/0.65ML injection    Sig: Inject 19,400 Units into the skin once.    Dispense:  1 each    Refill:  0  . lovastatin (MEVACOR) 20 MG tablet    Sig: Take 0.5 tablets (10 mg total) by mouth at bedtime.    Dispense:  90 tablet    Refill:  1  . metFORMIN (GLUCOPHAGE-XR) 500 MG 24 hr tablet    Sig: 1 tab po q am and 2 tabs po qhs    Dispense:  180 tablet    Refill:  1    DISCONTINUE ALL PREVIOUS REFILLS FOR THIS MEDICATION     Penni Homans, MD

## 2015-11-20 NOTE — Assessment & Plan Note (Signed)
Well controlled, no changes to meds. Encouraged heart healthy diet such as the DASH diet and exercise as tolerated.  °

## 2015-11-20 NOTE — Assessment & Plan Note (Signed)
Encouraged DASH diet, decrease po intake and increase exercise as tolerated. Needs 7-8 hours of sleep nightly. Avoid trans fats, eat small, frequent meals every 4-5 hours with lean proteins, complex carbs and healthy fats. Minimize simple carbs, GMO foods. 

## 2015-11-20 NOTE — Assessment & Plan Note (Signed)
Encouraged heart healthy diet, increase exercise, avoid trans fats, consider a krill oil cap daily 

## 2015-11-30 ENCOUNTER — Other Ambulatory Visit (HOSPITAL_BASED_OUTPATIENT_CLINIC_OR_DEPARTMENT_OTHER): Payer: Medicare Other

## 2015-12-07 ENCOUNTER — Ambulatory Visit (HOSPITAL_BASED_OUTPATIENT_CLINIC_OR_DEPARTMENT_OTHER)
Admission: RE | Admit: 2015-12-07 | Discharge: 2015-12-07 | Disposition: A | Discharge status: Home / Self Care | Payer: Medicare Other | Source: Ambulatory Visit | Attending: Family Medicine | Admitting: Family Medicine

## 2015-12-07 DIAGNOSIS — E559 Vitamin D deficiency, unspecified: Secondary | ICD-10-CM | POA: Insufficient documentation

## 2015-12-07 DIAGNOSIS — Z780 Asymptomatic menopausal state: Secondary | ICD-10-CM | POA: Insufficient documentation

## 2015-12-21 ENCOUNTER — Other Ambulatory Visit: Payer: Self-pay | Admitting: Family Medicine

## 2016-02-05 ENCOUNTER — Telehealth: Payer: Self-pay | Admitting: Family Medicine

## 2016-02-05 MED ORDER — LISINOPRIL 5 MG PO TABS: ORAL_TABLET | ORAL | Status: DC

## 2016-02-05 MED ORDER — METFORMIN HCL ER 500 MG PO TB24: ORAL_TABLET | ORAL | Status: DC

## 2016-02-05 MED ORDER — GLIMEPIRIDE 1 MG PO TABS: 1.0000 mg | ORAL_TABLET | Freq: Every day | ORAL | Status: DC

## 2016-02-05 NOTE — Telephone Encounter (Signed)
[id number] - [place], [address] AT [address]  Pt wanting to change drug store. She is out of lisinopril and glimepiride. Pt also needing metformin but has a few days of it left. Please send in today.

## 2016-02-05 NOTE — Telephone Encounter (Signed)
Prescriptions sent in as requested and patient informed

## 2016-02-12 ENCOUNTER — Other Ambulatory Visit (INDEPENDENT_AMBULATORY_CARE_PROVIDER_SITE_OTHER): Payer: Medicare Other

## 2016-02-12 ENCOUNTER — Other Ambulatory Visit: Payer: Medicare Other

## 2016-02-12 DIAGNOSIS — E669 Obesity, unspecified: Secondary | ICD-10-CM

## 2016-02-12 DIAGNOSIS — E1169 Type 2 diabetes mellitus with other specified complication: Secondary | ICD-10-CM

## 2016-02-12 DIAGNOSIS — E119 Type 2 diabetes mellitus without complications: Secondary | ICD-10-CM

## 2016-02-12 DIAGNOSIS — I10 Essential (primary) hypertension: Secondary | ICD-10-CM | POA: Diagnosis not present

## 2016-02-12 DIAGNOSIS — G6289 Other specified polyneuropathies: Secondary | ICD-10-CM

## 2016-02-12 DIAGNOSIS — E782 Mixed hyperlipidemia: Secondary | ICD-10-CM | POA: Diagnosis not present

## 2016-02-12 LAB — COMPREHENSIVE METABOLIC PANEL
ALBUMIN: 3.8 g/dL (ref 3.5–5.2)
ALK PHOS: 102 U/L (ref 39–117)
ALT: 20 U/L (ref 0–35)
AST: 34 U/L (ref 0–37)
BUN: 15 mg/dL (ref 6–23)
CALCIUM: 9.4 mg/dL (ref 8.4–10.5)
CO2: 28 mEq/L (ref 19–32)
Chloride: 105 mEq/L (ref 96–112)
Creatinine, Ser: 0.84 mg/dL (ref 0.40–1.20)
GFR: 69.62 mL/min (ref >60.00)
Glucose, Bld: 117 mg/dL — ABNORMAL HIGH (ref 70–99)
POTASSIUM: 3.8 meq/L (ref 3.5–5.1)
SODIUM: 141 meq/L (ref 135–145)
TOTAL PROTEIN: 7.1 g/dL (ref 6.0–8.3)
Total Bilirubin: 0.7 mg/dL (ref 0.2–1.2)

## 2016-02-12 LAB — LIPID PANEL
CHOLESTEROL: 131 mg/dL (ref 0–200)
HDL: 42.9 mg/dL (ref >39.00)
LDL CALC: 65 mg/dL (ref 0–99)
NonHDL: 87.76
TRIGLYCERIDES: 115 mg/dL (ref 0.0–149.0)
Total CHOL/HDL Ratio: 3
VLDL: 23 mg/dL (ref 0.0–40.0)

## 2016-02-12 LAB — TSH: TSH: 2.83 u[IU]/mL (ref 0.35–4.50)

## 2016-02-12 LAB — CBC
HCT: 39.5 % (ref 36.0–46.0)
Hemoglobin: 13.4 g/dL (ref 12.0–15.0)
MCHC: 33.9 g/dL (ref 30.0–36.0)
MCV: 92.2 fl (ref 78.0–100.0)
Platelets: 200 10*3/uL (ref 150.0–400.0)
RBC: 4.28 Mil/uL (ref 3.87–5.11)
RDW: 13.3 % (ref 11.5–15.5)
WBC: 10.1 10*3/uL (ref 4.0–10.5)

## 2016-02-12 LAB — HEMOGLOBIN A1C: Hgb A1c MFr Bld: 6.5 % (ref 4.6–6.5)

## 2016-02-19 ENCOUNTER — Ambulatory Visit (INDEPENDENT_AMBULATORY_CARE_PROVIDER_SITE_OTHER): Payer: Medicare Other | Admitting: Family Medicine

## 2016-02-19 ENCOUNTER — Encounter: Payer: Self-pay | Admitting: Family Medicine

## 2016-02-19 VITALS — BP 120/74 | HR 74 | Temp 98.2°F | Ht 65.0 in | Wt 218.0 lb

## 2016-02-19 DIAGNOSIS — K219 Gastro-esophageal reflux disease without esophagitis: Secondary | ICD-10-CM | POA: Diagnosis not present

## 2016-02-19 DIAGNOSIS — E669 Obesity, unspecified: Secondary | ICD-10-CM

## 2016-02-19 DIAGNOSIS — R740 Nonspecific elevation of levels of transaminase and lactic acid dehydrogenase [LDH]: Secondary | ICD-10-CM

## 2016-02-19 DIAGNOSIS — E119 Type 2 diabetes mellitus without complications: Secondary | ICD-10-CM

## 2016-02-19 DIAGNOSIS — E782 Mixed hyperlipidemia: Secondary | ICD-10-CM | POA: Diagnosis not present

## 2016-02-19 DIAGNOSIS — I10 Essential (primary) hypertension: Secondary | ICD-10-CM | POA: Diagnosis not present

## 2016-02-19 DIAGNOSIS — R7401 Elevation of levels of liver transaminase levels: Secondary | ICD-10-CM

## 2016-02-19 DIAGNOSIS — E1169 Type 2 diabetes mellitus with other specified complication: Secondary | ICD-10-CM

## 2016-02-19 NOTE — Assessment & Plan Note (Signed)
Avoid offending foods, start probiotics. Do not eat large meals in late evening and consider raising head of bed. Can continue Omeprazole prn

## 2016-02-19 NOTE — Progress Notes (Signed)
Pre visit review using our clinic review tool, if applicable. No additional management support is needed unless otherwise documented below in the visit note. 

## 2016-02-19 NOTE — Patient Instructions (Addendum)
Encouraged increased rest and hydration, add probiotics, zinc such as Coldeze or Xicam. Treat fevers as needed. Mucinex/Guaifenasin twice a day, vitamin C 500 mg and some elderberry  Melatonin 1-2 mg at bedtime for sleep    Hypertension Hypertension, commonly called high blood pressure, is when the force of blood pumping through your arteries is too strong. Your arteries are the blood vessels that carry blood from your heart throughout your body. A blood pressure reading consists of a higher number over a lower number, such as 110/72. The higher number (systolic) is the pressure inside your arteries when your heart pumps. The lower number (diastolic) is the pressure inside your arteries when your heart relaxes. Ideally you want your blood pressure below 120/80. Hypertension forces your heart to work harder to pump blood. Your arteries may become narrow or stiff. Having untreated or uncontrolled hypertension can cause heart attack, stroke, kidney disease, and other problems. RISK FACTORS Some risk factors for high blood pressure are controllable. Others are not.  Risk factors you cannot control include:   Race. You may be at higher risk if you are African American.  Age. Risk increases with age.  Gender. Men are at higher risk than women before age 19 years. After age 26, women are at higher risk than men. Risk factors you can control include:  Not getting enough exercise or physical activity.  Being overweight.  Getting too much fat, sugar, calories, or salt in your diet.  Drinking too much alcohol. SIGNS AND SYMPTOMS Hypertension does not usually cause signs or symptoms. Extremely high blood pressure (hypertensive crisis) may cause headache, anxiety, shortness of breath, and nosebleed. DIAGNOSIS To check if you have hypertension, your health care provider will measure your blood pressure while you are seated, with your arm held at the level of your heart. It should be measured at least  twice using the same arm. Certain conditions can cause a difference in blood pressure between your right and left arms. A blood pressure reading that is higher than normal on one occasion does not mean that you need treatment. If it is not clear whether you have high blood pressure, you may be asked to return on a different day to have your blood pressure checked again. Or, you may be asked to monitor your blood pressure at home for 1 or more weeks. TREATMENT Treating high blood pressure includes making lifestyle changes and possibly taking medicine. Living a healthy lifestyle can help lower high blood pressure. You may need to change some of your habits. Lifestyle changes may include:  Following the DASH diet. This diet is high in fruits, vegetables, and whole grains. It is low in salt, red meat, and added sugars.  Keep your sodium intake below 2,300 mg per day.  Getting at least 30-45 minutes of aerobic exercise at least 4 times per week.  Losing weight if necessary.  Not smoking.  Limiting alcoholic beverages.  Learning ways to reduce stress. Your health care provider may prescribe medicine if lifestyle changes are not enough to get your blood pressure under control, and if one of the following is true:  You are 70-76 years of age and your systolic blood pressure is above 140.  You are 39 years of age or older, and your systolic blood pressure is above 150.  Your diastolic blood pressure is above 90.  You have diabetes, and your systolic blood pressure is over [id number] or your diastolic blood pressure is over 90.  You have kidney disease  and your blood pressure is above 140/90.  You have heart disease and your blood pressure is above 140/90. Your personal target blood pressure may vary depending on your medical conditions, your age, and other factors. HOME CARE INSTRUCTIONS  Have your blood pressure rechecked as directed by your health care provider.   Take medicines only as  directed by your health care provider. Follow the directions carefully. Blood pressure medicines must be taken as prescribed. The medicine does not work as well when you skip doses. Skipping doses also puts you at risk for problems.  Do not smoke.   Monitor your blood pressure at home as directed by your health care provider. SEEK MEDICAL CARE IF:   You think you are having a reaction to medicines taken.  You have recurrent headaches or feel dizzy.  You have swelling in your ankles.  You have trouble with your vision. SEEK IMMEDIATE MEDICAL CARE IF:  You develop a severe headache or confusion.  You have unusual weakness, numbness, or feel faint.  You have severe chest or abdominal pain.  You vomit repeatedly.  You have trouble breathing. MAKE SURE YOU:   Understand these instructions.  Will watch your condition.  Will get help right away if you are not doing well or get worse.   This information is not intended to replace advice given to you by your health care provider. Make sure you discuss any questions you have with your health care provider.   Document Released: 12/12/2005 Document Revised: 04/28/2015 Document Reviewed: 10/04/2013 Elsevier Interactive Patient Education [employer].

## 2016-02-19 NOTE — Assessment & Plan Note (Signed)
Well controlled, no changes to meds. Encouraged heart healthy diet such as the DASH diet and exercise as tolerated.  °

## 2016-02-19 NOTE — Assessment & Plan Note (Signed)
Tolerating statin, encouraged heart healthy diet, avoid trans fats, minimize simple carbs and saturated fats. Increase exercise as tolerated 

## 2016-02-19 NOTE — Assessment & Plan Note (Signed)
LFTs normal today.  

## 2016-02-19 NOTE — Assessment & Plan Note (Signed)
hgba1c acceptable, minimize simple carbs. Increase exercise as tolerated. Continue current meds 

## 2016-02-19 NOTE — Assessment & Plan Note (Signed)
Encouraged DASH diet, decrease po intake and increase exercise as tolerated. Needs 7-8 hours of sleep nightly. Avoid trans fats, eat small, frequent meals every 4-5 hours with lean proteins, complex carbs and healthy fats. Minimize simple carbs 

## 2016-02-28 NOTE — Progress Notes (Signed)
Patient ID: Stacey Cooper, female   DOB: [birth year], 81 y.o.   MRN: [id number]   Subjective:    Patient ID: Stacey Cooper, female    DOB: Jan 06, 1935, 81 y.o.   MRN: [id number]  Chief Complaint  Patient presents with  . Follow-up    HPI Patient is in today for follow up. She continues to struggle with fatigue. Has been struggling with congestion and cough, no fevers or chills. Cough is productive of green phlegm. Is struggling with pain in her legs and trouble getting comfortable at bedtime and falling asleep due to her discomfort. Denies CP/palp/SOB/HA/congestion/fevers/GI or GU c/o. Taking meds as prescribed  Past Medical History  Diagnosis Date  . Diabetes mellitus   . UTI'S, HX OF 01/24/2011  . URINARY INCONTINENCE 02/07/2011  . SQUAMOUS CELL CARCINOMA OTHER SPEC SITES SKIN 01/24/2011  . NONSPEC ELEVATION OF LEVELS OF TRANSAMINASE/LDH 02/07/2011  . Morbid obesity ([place]) 01/24/2011  . MEASLES, HX OF 01/24/2011  . HAIR LOSS 01/24/2011  . GERD 01/24/2011  . Essential hypertension, benign 01/24/2011  . DM 01/24/2011  . CHOLELITHIASIS, HX OF 02/07/2011  . Basal cell carcinoma of skin of lip 01/24/2011  . ABDOMINAL PAIN, RIGHT UPPER QUADRANT, HX OF 01/24/2011  . Peripheral neuropathy ([place]) 04/20/2011  . SCC (squamous cell carcinoma), face 07/18/2011  . Actinic keratoses 07/18/2011  . Back pain 12/08/2011  . Hyperlipidemia 04/24/2012  . Insomnia 07/24/2012  . Bronchitis 12/20/2012  . Leg cramps 06/06/2013  . Hearing loss of both ears 06/28/2013  . Grief reaction 01/20/2014  . Urinary tract infection, site not specified 04/30/2014  . Diabetes mellitus type 2 in obese [facility]) 01/24/2011    Qualifier: Diagnosis of  By: [name], [name]  Annual eye exam with [facility] on 06/14/13   . Hyperlipidemia, mixed 04/24/2012  . Hepatic cirrhosis ([place]) 04/27/2015  . Right hip pain 05/03/2015  . Blood transfusion without reported diagnosis   . Cataract     bilataeral cateracts removed  . Chronic kidney disease      Past Surgical History  Procedure Laterality Date  . Abdominal hysterectomy      partial, both ovaries left in place  . Total knee replacement, b/l    . Cataract extraction, b/l    . Skin bx, right anterior cw , squamous cancer, had to redo to margins  12/2010  . Bcc removed below lower lip on left  26 yrs ago  . Left knee arthroscopy and cleaning prior to tkr    . Colonoscopy      Family History  Problem Relation Age of Onset  . Arthritis Mother     RA  . Myelodysplastic syndrome Mother   . Cancer Father     skin  . Stroke Father   . Colon cancer Father     2 in larage intes removed carcinoid tumor confined in the tumor  . Stroke Maternal Grandmother   . Kidney failure Maternal Grandfather   . Esophageal cancer Neg Hx   . Rectal cancer Neg Hx   . Stomach cancer Neg Hx     Social History   Social History  . Marital Status: Married    Spouse Name: N/A  . Number of Children: N/A  . Years of Education: N/A   Occupational History  . Not on file.   Social History Main Topics  . Smoking status: Never Smoker   . Smokeless tobacco: Never Used  . Alcohol Use: No  . Drug Use: No  .  Sexual Activity: No   Other Topics Concern  . Not on file   Social History Narrative    Outpatient Prescriptions Prior to Visit  Medication Sig Dispense Refill  . glimepiride (AMARYL) 1 MG tablet Take 1 tablet (1 mg total) by mouth daily with breakfast. 90 tablet 1  . glucose blood (FREESTYLE LITE) test strip Use as twice daily to check blood sugar.  DX E11.40 100 each 12  . Lancets MISC by Does not apply route. Check blood sugars qam and once daily prn     . Liraglutide (VICTOZA) 18 MG/3ML SOPN Inject 0.3 mLs (1.8 mg total) into the skin daily. 9 mL 3  . lisinopril (PRINIVIL,ZESTRIL) 5 MG tablet TAKE 1 TABLET (5 MG TOTAL) BY MOUTH DAILY. 90 tablet 1  . lovastatin (MEVACOR) 20 MG tablet Take 0.5 tablets (10 mg total) by mouth at bedtime. 90 tablet 1  . metFORMIN (GLUCOPHAGE-XR) 500  MG 24 hr tablet 1 tab po q am and 2 tabs po qhs 270 tablet 1  . NOVOFINE 32G X 6 MM MISC USE WITH VICTOZA 100 each 6  . omeprazole (PRILOSEC) 20 MG capsule TAKE 1 CAPSULE (20 MG TOTAL) BY MOUTH DAILY. 30 capsule 5  . rOPINIRole (REQUIP) 0.5 MG tablet Take 1-2 tablets (0.5-1 mg total) by mouth at bedtime. 60 tablet 5  . zoster vaccine live, PF, (ZOSTAVAX) 91478 UNT/0.65ML injection Inject 19,400 Units into the skin once. 1 each 0   No facility-administered medications prior to visit.    Allergies  Allergen Reactions  . Contrast Media [Iodinated Diagnostic Agents] Shortness Of Breath    Review of Systems  Constitutional: Positive for malaise/fatigue. Negative for fever.  HENT: Positive for congestion.   Eyes: Negative for discharge.  Respiratory: Positive for cough and sputum production. Negative for shortness of breath.   Cardiovascular: Negative for chest pain, palpitations and leg swelling.  Gastrointestinal: Negative for nausea and abdominal pain.  Genitourinary: Negative for dysuria.  Musculoskeletal: Negative for falls.  Skin: Negative for rash.  Neurological: Negative for loss of consciousness and headaches.  Endo/Heme/Allergies: Negative for environmental allergies.  Psychiatric/Behavioral: Positive for depression. The patient is nervous/anxious.        Objective:    Physical Exam  Constitutional: She is oriented to person, place, and time. She appears well-developed and well-nourished. No distress.  HENT:  Head: Normocephalic and atraumatic.  Nose: Nose normal.  Eyes: Right eye exhibits no discharge. Left eye exhibits no discharge.  Neck: Normal range of motion. Neck supple.  Cardiovascular: Normal rate and regular rhythm.   Murmur heard. Pulmonary/Chest: Effort normal and breath sounds normal.  Abdominal: Soft. Bowel sounds are normal. There is no tenderness.  Musculoskeletal: She exhibits no edema.  Neurological: She is alert and oriented to person, place, and  time.  Skin: Skin is warm and dry.  Psychiatric: She has a normal mood and affect.  Nursing note and vitals reviewed.   BP 120/74 mmHg  Pulse 74  Temp(Src) 98.2 F (36.8 C) (Oral)  Ht 5\' 5"  (1.651 m)  Wt 218 lb (98.884 kg)  BMI 36.28 kg/m2  SpO2 98% Wt Readings from Last 3 Encounters:  02/19/16 218 lb (98.884 kg)  11/12/15 214 lb (97.07 kg)  07/31/15 207 lb 4 oz (94.008 kg)     Lab Results  Component Value Date   WBC 10.1 02/12/2016   HGB 13.4 02/12/2016   HCT 39.5 02/12/2016   PLT 200.0 02/12/2016   GLUCOSE 117* 02/12/2016   CHOL 131  02/12/2016   TRIG 115.0 02/12/2016   HDL 42.90 02/12/2016   LDLCALC 65 02/12/2016   ALT 20 02/12/2016   AST 34 02/12/2016   NA 141 02/12/2016   K 3.8 02/12/2016   CL 105 02/12/2016   CREATININE 0.84 02/12/2016   BUN 15 02/12/2016   CO2 28 02/12/2016   TSH 2.83 02/12/2016   INR 1.1* 05/07/2015   HGBA1C 6.5 02/12/2016   MICROALBUR <0.7 11/12/2015    Lab Results  Component Value Date   TSH 2.83 02/12/2016   Lab Results  Component Value Date   WBC 10.1 02/12/2016   HGB 13.4 02/12/2016   HCT 39.5 02/12/2016   MCV 92.2 02/12/2016   PLT 200.0 02/12/2016   Lab Results  Component Value Date   NA 141 02/12/2016   K 3.8 02/12/2016   CO2 28 02/12/2016   GLUCOSE 117* 02/12/2016   BUN 15 02/12/2016   CREATININE 0.84 02/12/2016   BILITOT 0.7 02/12/2016   ALKPHOS 102 02/12/2016   AST 34 02/12/2016   ALT 20 02/12/2016   PROT 7.1 02/12/2016   ALBUMIN 3.8 02/12/2016   CALCIUM 9.4 02/12/2016   GFR 69.62 02/12/2016   Lab Results  Component Value Date   CHOL 131 02/12/2016   Lab Results  Component Value Date   HDL 42.90 02/12/2016   Lab Results  Component Value Date   LDLCALC 65 02/12/2016   Lab Results  Component Value Date   TRIG 115.0 02/12/2016   Lab Results  Component Value Date   CHOLHDL 3 02/12/2016   Lab Results  Component Value Date   HGBA1C 6.5 02/12/2016       Assessment & Plan:   Problem List  Items Addressed This Visit    Diabetes mellitus type 2 in obese ([place]) - Primary    hgba1c acceptable, minimize simple carbs. Increase exercise as tolerated. Continue current meds      Relevant Orders   Hemoglobin A1c   ESSENTIAL HYPERTENSION, BENIGN    Well controlled, no changes to meds. Encouraged heart healthy diet such as the DASH diet and exercise as tolerated.       GERD    Avoid offending foods, start probiotics. Do not eat large meals in late evening and consider raising head of bed. Can continue Omeprazole prn      Hyperlipidemia, mixed    Tolerating statin, encouraged heart healthy diet, avoid trans fats, minimize simple carbs and saturated fats. Increase exercise as tolerated      MORBID OBESITY    Encouraged DASH diet, decrease po intake and increase exercise as tolerated. Needs 7-8 hours of sleep nightly. Avoid trans fats, eat small, frequent meals every 4-5 hours with lean proteins, complex carbs and healthy fats. Minimize simple carbs      NONSPEC ELEVATION OF LEVELS OF TRANSAMINASE/LDH    LFTs normal today         I am having Ms. Lizotte maintain her Lancets, Liraglutide, glucose blood, omeprazole, rOPINIRole, zoster vaccine live (PF), lovastatin, NOVOFINE, glimepiride, lisinopril, and metFORMIN.  No orders of the defined types were placed in this encounter.     Penni Homans, MD

## 2016-03-18 ENCOUNTER — Other Ambulatory Visit: Payer: Self-pay | Admitting: Family Medicine

## 2016-05-11 ENCOUNTER — Other Ambulatory Visit (INDEPENDENT_AMBULATORY_CARE_PROVIDER_SITE_OTHER): Payer: Medicare Other

## 2016-05-11 DIAGNOSIS — E1169 Type 2 diabetes mellitus with other specified complication: Secondary | ICD-10-CM

## 2016-05-11 DIAGNOSIS — E119 Type 2 diabetes mellitus without complications: Secondary | ICD-10-CM | POA: Diagnosis not present

## 2016-05-11 DIAGNOSIS — E669 Obesity, unspecified: Secondary | ICD-10-CM | POA: Diagnosis not present

## 2016-05-11 LAB — HEMOGLOBIN A1C: HEMOGLOBIN A1C: 6.6 % — AB (ref 4.6–6.5)

## 2016-05-19 ENCOUNTER — Ambulatory Visit (INDEPENDENT_AMBULATORY_CARE_PROVIDER_SITE_OTHER): Payer: Medicare Other | Admitting: Family Medicine

## 2016-05-19 ENCOUNTER — Encounter: Payer: Self-pay | Admitting: Family Medicine

## 2016-05-19 ENCOUNTER — Ambulatory Visit: Payer: Medicare Other

## 2016-05-19 VITALS — BP 108/68 | HR 77 | Temp 97.6°F | Ht 65.0 in | Wt 214.1 lb

## 2016-05-19 DIAGNOSIS — K219 Gastro-esophageal reflux disease without esophagitis: Secondary | ICD-10-CM

## 2016-05-19 DIAGNOSIS — E119 Type 2 diabetes mellitus without complications: Secondary | ICD-10-CM | POA: Diagnosis not present

## 2016-05-19 DIAGNOSIS — I10 Essential (primary) hypertension: Secondary | ICD-10-CM | POA: Diagnosis not present

## 2016-05-19 DIAGNOSIS — G6289 Other specified polyneuropathies: Secondary | ICD-10-CM

## 2016-05-19 DIAGNOSIS — E669 Obesity, unspecified: Secondary | ICD-10-CM

## 2016-05-19 DIAGNOSIS — E782 Mixed hyperlipidemia: Secondary | ICD-10-CM

## 2016-05-19 DIAGNOSIS — E1169 Type 2 diabetes mellitus with other specified complication: Secondary | ICD-10-CM

## 2016-05-19 MED ORDER — OMEPRAZOLE 20 MG PO CPDR: DELAYED_RELEASE_CAPSULE | ORAL | Status: DC

## 2016-05-19 NOTE — Progress Notes (Signed)
Pre visit review using our clinic review tool, if applicable. No additional management support is needed unless otherwise documented below in the visit note. 

## 2016-05-19 NOTE — Patient Instructions (Signed)

## 2016-05-21 ENCOUNTER — Other Ambulatory Visit: Payer: Self-pay | Admitting: Family Medicine

## 2016-05-26 ENCOUNTER — Ambulatory Visit: Payer: Medicare Other

## 2016-05-29 NOTE — Assessment & Plan Note (Signed)
Well controlled, no changes to meds. Encouraged heart healthy diet such as the DASH diet and exercise as tolerated.  °

## 2016-05-29 NOTE — Assessment & Plan Note (Signed)
Symptoms persistent but manageable no meds at this time.

## 2016-05-29 NOTE — Assessment & Plan Note (Signed)
Avoid offending foods, start probiotics. Do not eat large meals in late evening and consider raising head of bed.  

## 2016-05-29 NOTE — Progress Notes (Signed)
Patient ID: Stacey Cooper, female   DOB: 10-08-1935, 81 y.o.   MRN: [id number]   Subjective:    Patient ID: Stacey Cooper, female    DOB: 23-Feb-1935, 81 y.o.   MRN: [id number]  Chief Complaint  Patient presents with  . Follow-up    HPI Patient is in today for follow up. She is feeling well today. No recent acute illness or hospitalization. Peripheral neuropathy in legs persists but chooses not to take meds at this time. Denies CP/palp/SOB/HA/congestion/fevers/GI or GU c/o. Taking meds as prescribed  Past Medical History  Diagnosis Date  . Diabetes mellitus   . UTI'S, HX OF 01/24/2011  . URINARY INCONTINENCE 02/07/2011  . SQUAMOUS CELL CARCINOMA OTHER SPEC SITES SKIN 01/24/2011  . NONSPEC ELEVATION OF LEVELS OF TRANSAMINASE/LDH 02/07/2011  . Morbid obesity ([place]) 01/24/2011  . MEASLES, HX OF 01/24/2011  . HAIR LOSS 01/24/2011  . GERD 01/24/2011  . Essential hypertension, benign 01/24/2011  . DM 01/24/2011  . CHOLELITHIASIS, HX OF 02/07/2011  . Basal cell carcinoma of skin of lip 01/24/2011  . ABDOMINAL PAIN, RIGHT UPPER QUADRANT, HX OF 01/24/2011  . Peripheral neuropathy ([place]) 04/20/2011  . SCC (squamous cell carcinoma), face 07/18/2011  . Actinic keratoses 07/18/2011  . Back pain 12/08/2011  . Hyperlipidemia 04/24/2012  . Insomnia 07/24/2012  . Bronchitis 12/20/2012  . Leg cramps 06/06/2013  . Hearing loss of both ears 06/28/2013  . Grief reaction 01/20/2014  . Urinary tract infection, site not specified 04/30/2014  . Diabetes mellitus type 2 in obese [facility]) 01/24/2011    Qualifier: Diagnosis of  By: [name], [name]  Annual eye exam with [facility] on 06/14/13   . Hyperlipidemia, mixed 04/24/2012  . Hepatic cirrhosis ([place]) 04/27/2015  . Right hip pain 05/03/2015  . Blood transfusion without reported diagnosis   . Cataract     bilataeral cateracts removed  . Chronic kidney disease     Past Surgical History  Procedure Laterality Date  . Abdominal hysterectomy      partial, both ovaries  left in place  . Total knee replacement, b/l    . Cataract extraction, b/l    . Skin bx, right anterior cw , squamous cancer, had to redo to margins  12/2010  . Bcc removed below lower lip on left  26 yrs ago  . Left knee arthroscopy and cleaning prior to tkr    . Colonoscopy      Family History  Problem Relation Age of Onset  . Arthritis Mother     RA  . Myelodysplastic syndrome Mother   . Cancer Father     skin  . Stroke Father   . Colon cancer Father     58 in larage intes removed carcinoid tumor confined in the tumor  . Stroke Maternal Grandmother   . Kidney failure Maternal Grandfather   . Esophageal cancer Neg Hx   . Rectal cancer Neg Hx   . Stomach cancer Neg Hx     Social History   Social History  . Marital Status: Married    Spouse Name: N/A  . Number of Children: N/A  . Years of Education: N/A   Occupational History  . Not on file.   Social History Main Topics  . Smoking status: Never Smoker   . Smokeless tobacco: Never Used  . Alcohol Use: No  . Drug Use: No  . Sexual Activity: No   Other Topics Concern  . Not on file   Social History Narrative  Outpatient Prescriptions Prior to Visit  Medication Sig Dispense Refill  . glimepiride (AMARYL) 1 MG tablet Take 1 tablet (1 mg total) by mouth daily with breakfast. 90 tablet 1  . glucose blood (FREESTYLE LITE) test strip Use as twice daily to check blood sugar.  DX E11.40 100 each 12  . Lancets MISC by Does not apply route. Check blood sugars qam and once daily prn     . Liraglutide (VICTOZA) 18 MG/3ML SOPN Inject 0.3 mLs (1.8 mg total) into the skin daily. 9 mL 3  . lisinopril (PRINIVIL,ZESTRIL) 5 MG tablet TAKE 1 TABLET (5 MG TOTAL) BY MOUTH DAILY. 90 tablet 1  . metFORMIN (GLUCOPHAGE-XR) 500 MG 24 hr tablet 1 tab po q am and 2 tabs po qhs 270 tablet 1  . NOVOFINE 32G X 6 MM MISC USE WITH VICTOZA 100 each 6  . rOPINIRole (REQUIP) 0.5 MG tablet Take 1-2 tablets (0.5-1 mg total) by mouth at bedtime. 60  tablet 5  . zoster vaccine live, PF, (ZOSTAVAX) 16109 UNT/0.65ML injection Inject 19,400 Units into the skin once. 1 each 0  . lovastatin (MEVACOR) 20 MG tablet Take 0.5 tablets (10 mg total) by mouth at bedtime. 90 tablet 1  . metFORMIN (GLUCOPHAGE-XR) 500 MG 24 hr tablet TAKE 2 TABLETS IN AM AND 1 TABLET IN PM BY MOUTH 270 tablet 1  . omeprazole (PRILOSEC) 20 MG capsule TAKE 1 CAPSULE (20 MG TOTAL) BY MOUTH DAILY. 30 capsule 5   No facility-administered medications prior to visit.    Allergies  Allergen Reactions  . Contrast Media [Iodinated Diagnostic Agents] Shortness Of Breath    Review of Systems  Constitutional: Negative for fever and malaise/fatigue.  HENT: Negative for congestion.   Eyes: Negative for blurred vision.  Respiratory: Negative for shortness of breath.   Cardiovascular: Negative for chest pain, palpitations and leg swelling.  Gastrointestinal: Negative for nausea, abdominal pain and blood in stool.  Genitourinary: Negative for dysuria and frequency.  Musculoskeletal: Negative for falls.  Skin: Negative for rash.  Neurological: Positive for sensory change. Negative for dizziness, loss of consciousness and headaches.  Endo/Heme/Allergies: Negative for environmental allergies.  Psychiatric/Behavioral: Negative for depression. The patient is not nervous/anxious.        Objective:    Physical Exam  Constitutional: She is oriented to person, place, and time. She appears well-developed and well-nourished. No distress.  HENT:  Head: Normocephalic and atraumatic.  Nose: Nose normal.  Eyes: Right eye exhibits no discharge. Left eye exhibits no discharge.  Neck: Normal range of motion. Neck supple.  Cardiovascular: Normal rate and regular rhythm.   No murmur heard. Pulmonary/Chest: Effort normal and breath sounds normal.  Abdominal: Soft. Bowel sounds are normal. There is no tenderness.  Musculoskeletal: She exhibits no edema.  Neurological: She is alert and  oriented to person, place, and time.  Skin: Skin is warm and dry.  Psychiatric: She has a normal mood and affect.  Nursing note and vitals reviewed.   BP 108/68 mmHg  Pulse 77  Temp(Src) 97.6 F (36.4 C) (Oral)  Ht 5\' 5"  (1.651 m)  Wt 214 lb 2 oz (97.126 kg)  BMI 35.63 kg/m2  SpO2 97% Wt Readings from Last 3 Encounters:  05/19/16 214 lb 2 oz (97.126 kg)  02/19/16 218 lb (98.884 kg)  11/12/15 214 lb (97.07 kg)     Lab Results  Component Value Date   WBC 10.1 02/12/2016   HGB 13.4 02/12/2016   HCT 39.5 02/12/2016   PLT  200.0 02/12/2016   GLUCOSE 117* 02/12/2016   CHOL 131 02/12/2016   TRIG 115.0 02/12/2016   HDL 42.90 02/12/2016   LDLCALC 65 02/12/2016   ALT 20 02/12/2016   AST 34 02/12/2016   NA 141 02/12/2016   K 3.8 02/12/2016   CL 105 02/12/2016   CREATININE 0.84 02/12/2016   BUN 15 02/12/2016   CO2 28 02/12/2016   TSH 2.83 02/12/2016   INR 1.1* 05/07/2015   HGBA1C 6.6* 05/11/2016   MICROALBUR <0.7 11/12/2015    Lab Results  Component Value Date   TSH 2.83 02/12/2016   Lab Results  Component Value Date   WBC 10.1 02/12/2016   HGB 13.4 02/12/2016   HCT 39.5 02/12/2016   MCV 92.2 02/12/2016   PLT 200.0 02/12/2016   Lab Results  Component Value Date   NA 141 02/12/2016   K 3.8 02/12/2016   CO2 28 02/12/2016   GLUCOSE 117* 02/12/2016   BUN 15 02/12/2016   CREATININE 0.84 02/12/2016   BILITOT 0.7 02/12/2016   ALKPHOS 102 02/12/2016   AST 34 02/12/2016   ALT 20 02/12/2016   PROT 7.1 02/12/2016   ALBUMIN 3.8 02/12/2016   CALCIUM 9.4 02/12/2016   GFR 69.62 02/12/2016   Lab Results  Component Value Date   CHOL 131 02/12/2016   Lab Results  Component Value Date   HDL 42.90 02/12/2016   Lab Results  Component Value Date   LDLCALC 65 02/12/2016   Lab Results  Component Value Date   TRIG 115.0 02/12/2016   Lab Results  Component Value Date   CHOLHDL 3 02/12/2016   Lab Results  Component Value Date   HGBA1C 6.6* 05/11/2016         Assessment & Plan:   Problem List Items Addressed This Visit    Peripheral neuropathy ([place])    Symptoms persistent but manageable no meds at this time.      Relevant Orders   TSH   CBC   Hemoglobin A1c   Lipid panel   Comprehensive metabolic panel   MORBID OBESITY   Relevant Orders   TSH   CBC   Hemoglobin A1c   Lipid panel   Comprehensive metabolic panel   Hyperlipidemia, mixed - Primary   Relevant Orders   TSH   CBC   Hemoglobin A1c   Lipid panel   Comprehensive metabolic panel   CBC with Differential/Platelet   Comprehensive metabolic panel   Hemoglobin A1c   Lipid panel   TSH   GERD    Avoid offending foods, start probiotics. Do not eat large meals in late evening and consider raising head of bed.       Relevant Medications   omeprazole (PRILOSEC) 20 MG capsule   Other Relevant Orders   TSH   CBC   Hemoglobin A1c   Lipid panel   Comprehensive metabolic panel   ESSENTIAL HYPERTENSION, BENIGN    Well controlled, no changes to meds. Encouraged heart healthy diet such as the DASH diet and exercise as tolerated.       Relevant Orders   TSH   CBC   Hemoglobin A1c   Lipid panel   Comprehensive metabolic panel   CBC with Differential/Platelet   Comprehensive metabolic panel   Hemoglobin A1c   Lipid panel   TSH   Diabetes mellitus type 2 in obese (HCC)    hgba1c acceptable, minimize simple carbs. Increase exercise as tolerated. Continue current meds      Relevant Orders  TSH   CBC   Hemoglobin A1c   Lipid panel   Comprehensive metabolic panel   CBC with Differential/Platelet   Comprehensive metabolic panel   Hemoglobin A1c   Lipid panel   TSH      I am having Ms. Sallie maintain her Lancets, Liraglutide, glucose blood, rOPINIRole, zoster vaccine live (PF), NOVOFINE, glimepiride, lisinopril, metFORMIN, and omeprazole.  Meds ordered this encounter  Medications  . omeprazole (PRILOSEC) 20 MG capsule    Sig: TAKE 1 CAPSULE (20 MG TOTAL) BY  MOUTH DAILY.    Dispense:  90 capsule    Refill:  1     Raymond Bhardwaj, MD

## 2016-05-29 NOTE — Assessment & Plan Note (Signed)
hgba1c acceptable, minimize simple carbs. Increase exercise as tolerated. Continue current meds 

## 2016-07-18 LAB — HM MAMMOGRAPHY

## 2016-07-26 ENCOUNTER — Encounter: Payer: Self-pay | Admitting: Family Medicine

## 2016-07-28 ENCOUNTER — Other Ambulatory Visit: Payer: Self-pay | Admitting: Family Medicine

## 2016-07-28 DIAGNOSIS — E1165 Type 2 diabetes mellitus with hyperglycemia: Secondary | ICD-10-CM

## 2016-07-28 MED ORDER — LIRAGLUTIDE 18 MG/3ML ~~LOC~~ SOPN: 1.8000 mg | PEN_INJECTOR | Freq: Every day | SUBCUTANEOUS | 3 refills | Status: DC

## 2016-07-28 NOTE — Telephone Encounter (Signed)
Rx sent 

## 2016-07-28 NOTE — Telephone Encounter (Signed)
Relation to [id number] Call back number:[phone] Pharmacy: [facility] [place], [place] [phone] (Phone) [phone] (Fax)     Reason for call:  Patient requesting a refill Liraglutide (VICTOZA) 18 MG/3ML SOPN

## 2016-08-05 IMAGING — DX DG LUMBAR SPINE COMPLETE 4+V
5 series · 5 of 5 positions shown · non-contrast
Comparison: None.

CLINICAL DATA: Right-sided low back pain with radiation into the
right groin

EXAM:
LUMBAR SPINE - COMPLETE 4+ VIEW

[l-spine ap]
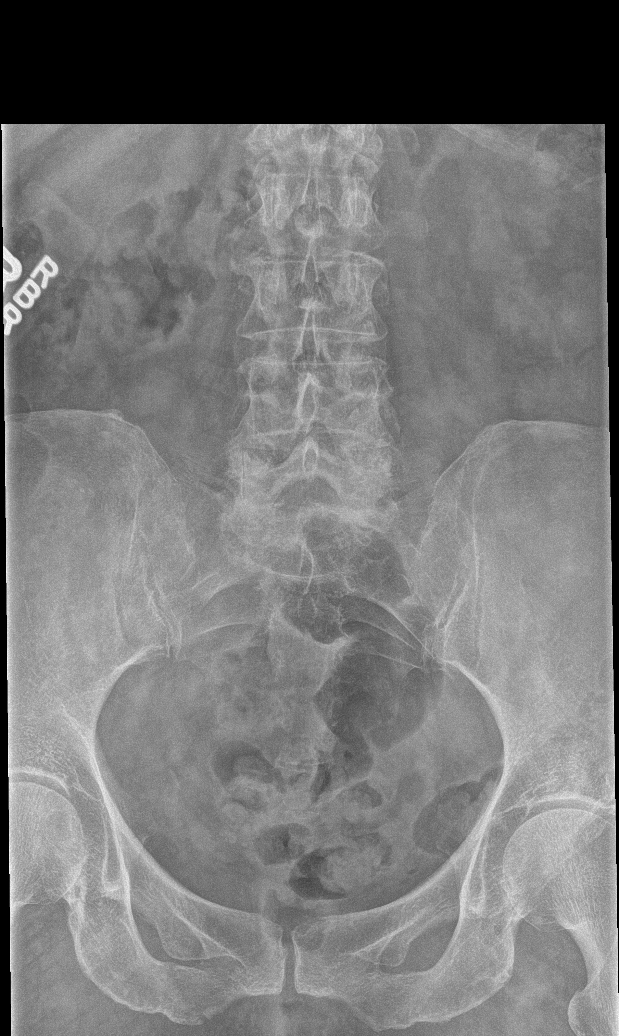

[l-spine obl (1 of 2)]
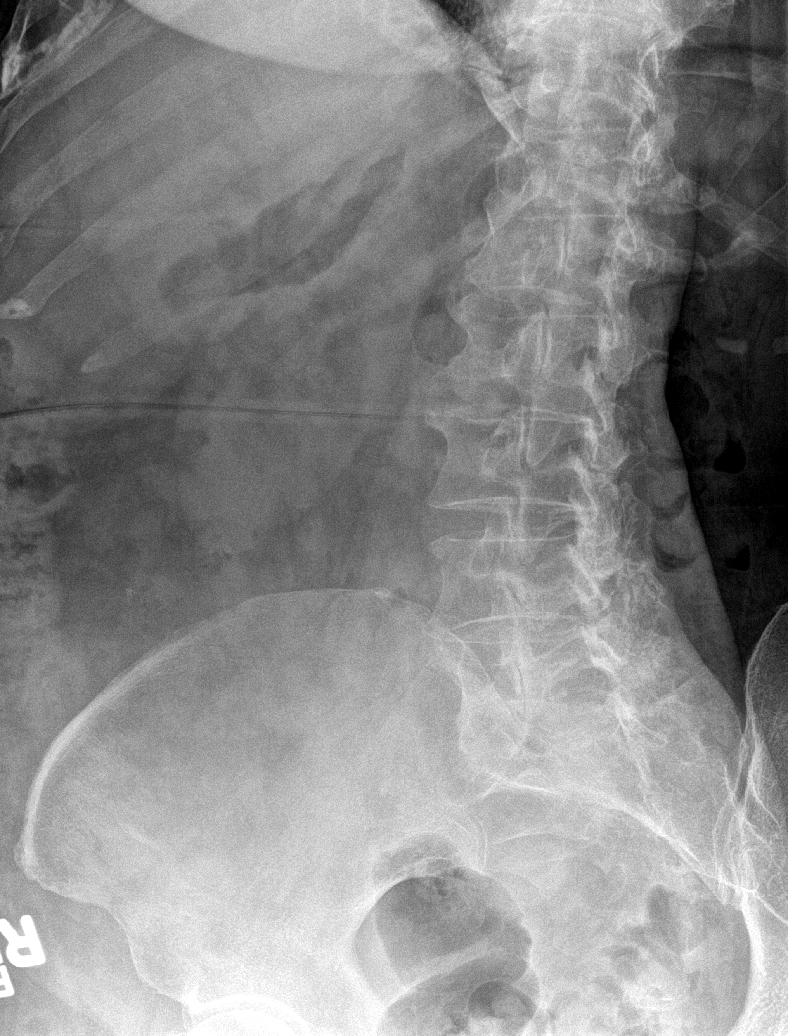

[l-spine obl (2 of 2)]
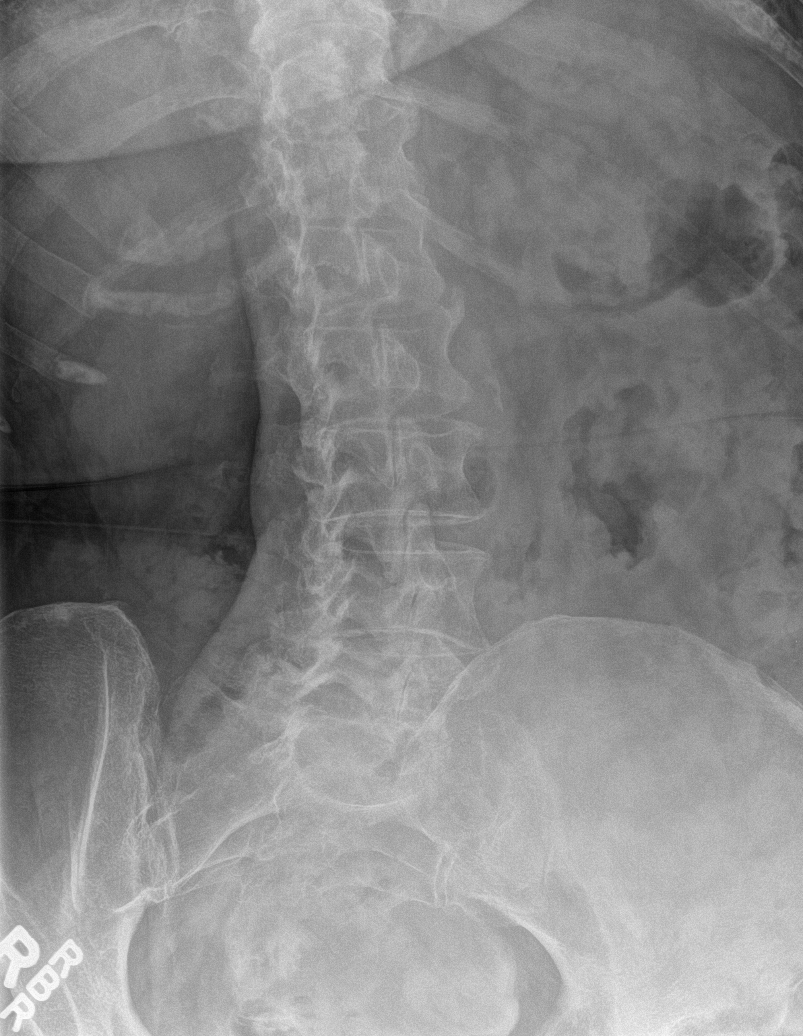

[l-spine lat]
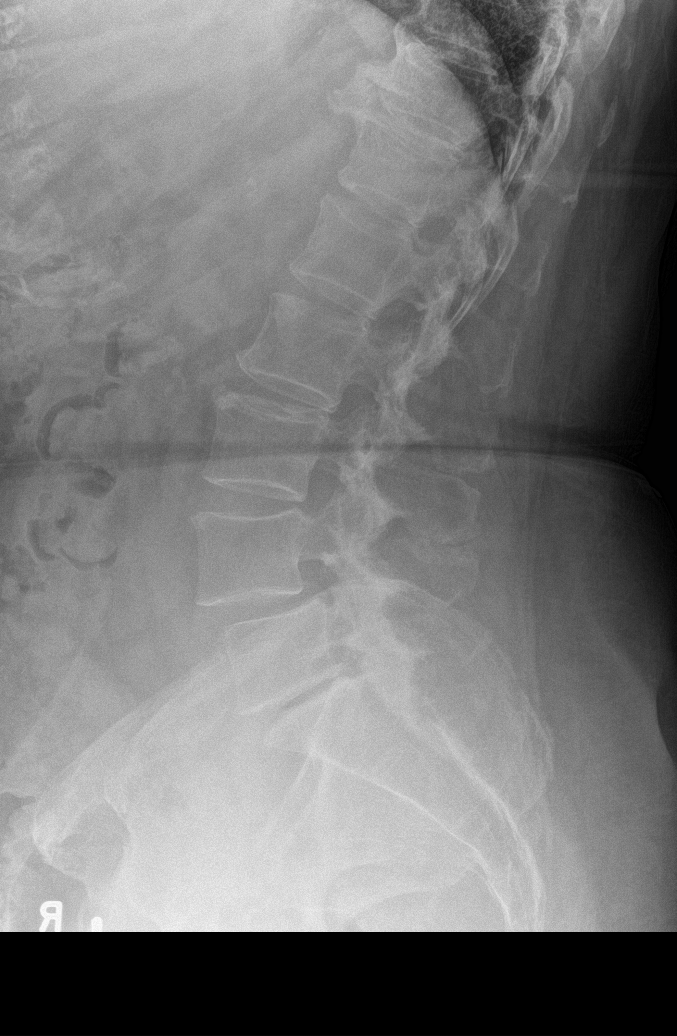

[l-spine spot]
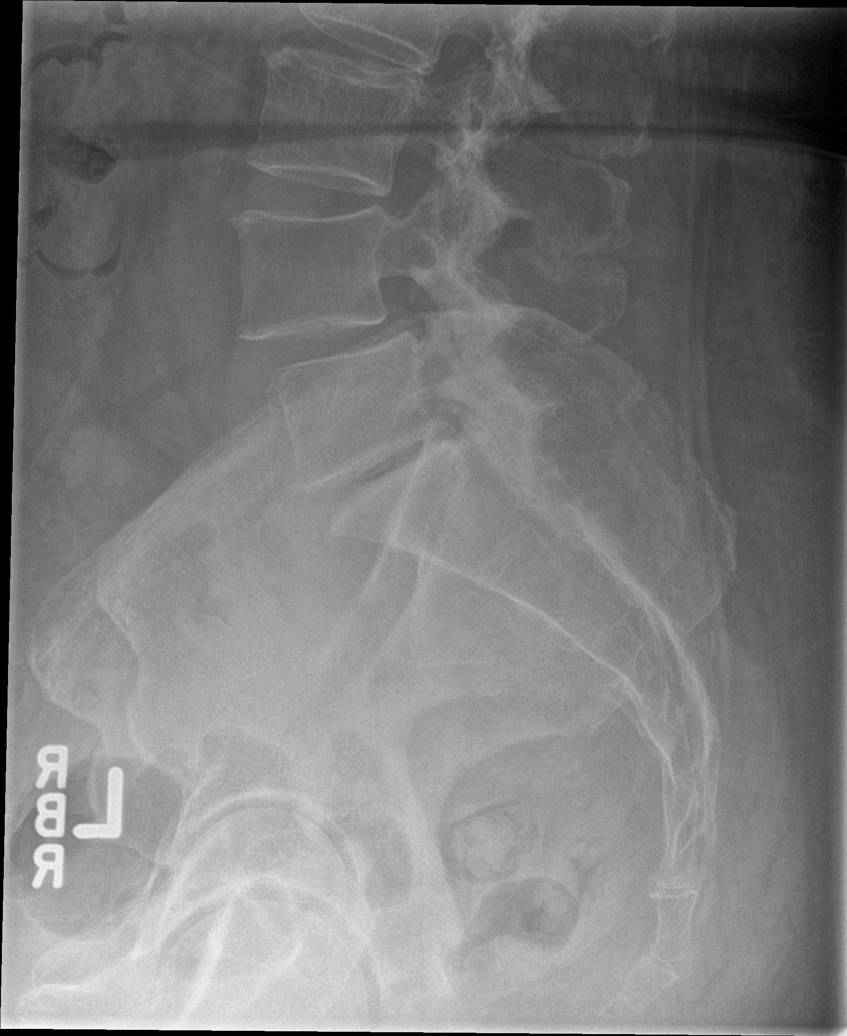

[5 of 5 positions shown; findings below may reference images not displayed]

FINDINGS: Five lumbar type vertebral bodies are well visualized. Vertebral
body height is well maintained. Mild anterolisthesis of L4 on L5 is
noted of a degenerative nature. Disc space narrowing is noted at
L5-S1. Facet hypertrophic changes are noted. No gross soft tissue
abnormality is seen.
IMPRESSION: Degenerative change with anterolisthesis of L4 on L5.

## 2016-08-05 NOTE — Progress Notes (Addendum)
Subjective:   Stacey Cooper is a 81 y.o. female who presents for Medicare Annual (Subsequent) preventive examination.  Review of Systems:  No ROS.  Medicare Wellness Visit.  Cardiac Risk Factors include: advanced age (>46men, >43 women);diabetes mellitus;dyslipidemia;hypertension;obesity (BMI >30kg/m2);sedentary lifestyle  Sleep patterns: Sleeps restlessly due to restless legs. Gets up once nightly to void. 6-7 hours nightly.  Home Safety/Smoke Alarms: Lives in home alone, son lives nearby. 1 step in from garage and 3 steps in from sunroom, both w/ rails. Grab bars in walk-in shower. Smoke alarms in home. Living environment; residence and Firearm Safety: Stored in a safe place. Seat Belt Safety/Bike Helmet: Wears seat belt.   Counseling:   Eye Exam- Dr. Herbert Deaner yearly or PRN  Dental- Denture upper and lower  Female:   Pap- Hysterectomy      Mammo- 07/19/16 No evidence of malignancy        Dexa scan- 12/11/15, normal      CCS- 05/11/15 w/ Dr. Fuller Plan, two sessile polyps in the ascending colon, two angiodysplastic lesions in the transverse colon and ascending colon, external hemorrhoids; no recall due to age     Objective:     Vitals: BP 118/66 (BP Location: Right Arm, Patient Position: Sitting, Cuff Size: Large)   Pulse 79   Resp 16   Ht 5\' 3"  (1.6 m)   Wt 217 lb 3.2 oz (98.5 kg)   SpO2 97%   BMI 38.48 kg/m   Body mass index is 38.48 kg/m.   Tobacco History  Smoking Status  . Never Smoker  Smokeless Tobacco  . Never Used     Counseling given: Not Answered   Past Medical History:  Diagnosis Date  . ABDOMINAL PAIN, RIGHT UPPER QUADRANT, HX OF 01/24/2011  . Actinic keratoses 07/18/2011  . Back pain 12/08/2011  . Basal cell carcinoma of skin of lip 01/24/2011  . Blood transfusion without reported diagnosis   . Bronchitis 12/20/2012  . Cataract    bilataeral cateracts removed  . CHOLELITHIASIS, HX OF 02/07/2011  . Chronic kidney disease   . Diabetes mellitus   .  Diabetes mellitus type 2 in obese [facility]) 01/24/2011   Qualifier: Diagnosis of  By: [name], [name]  [facility] with [facility] on 06/14/13   . DM 01/24/2011  . Essential hypertension, benign 01/24/2011  . GERD 01/24/2011  . Grief reaction 01/20/2014  . HAIR LOSS 01/24/2011  . Hearing loss of both ears 06/28/2013  . Hepatic cirrhosis ([place]) 04/27/2015  . Hyperlipidemia 04/24/2012  . Hyperlipidemia, mixed 04/24/2012  . Insomnia 07/24/2012  . Leg cramps 06/06/2013  . MEASLES, HX OF 01/24/2011  . Morbid obesity ([place]) 01/24/2011  . NONSPEC ELEVATION OF LEVELS OF TRANSAMINASE/LDH 02/07/2011  . Peripheral neuropathy ([place]) 04/20/2011  . Right hip pain 05/03/2015  . SCC (squamous cell carcinoma), face 07/18/2011  . SQUAMOUS CELL CARCINOMA OTHER SPEC SITES SKIN 01/24/2011  . URINARY INCONTINENCE 02/07/2011  . Urinary tract infection, site not specified 04/30/2014  . UTI'S, HX OF 01/24/2011   Past Surgical History:  Procedure Laterality Date  . ABDOMINAL HYSTERECTOMY     partial, both ovaries left in place  . BCC removed below lower lip on left  26 yrs ago  . cataract extraction, b/l    . COLONOSCOPY    . Left knee arthroscopy and cleaning prior to TKR    . skin bx, right anterior CW , squamous cancer, had to redo to margins  12/2010  . total knee replacement,  b/l     Family History  Problem Relation Age of Onset  . Arthritis Mother     RA  . Myelodysplastic syndrome Mother   . Cancer Father     skin  . Stroke Father   . Colon cancer Father     43 in larage intes removed carcinoid tumor confined in the tumor  . Stroke Maternal Grandmother   . Kidney failure Maternal Grandfather   . Esophageal cancer Neg Hx   . Rectal cancer Neg Hx   . Stomach cancer Neg Hx    History  Sexual Activity  . Sexual activity: Yes    Outpatient Encounter Prescriptions as of 08/08/2016  Medication Sig  . glimepiride (AMARYL) 1 MG tablet Take 1 tablet (1 mg total) by mouth daily with breakfast.  . glucose blood  (FREESTYLE LITE) test strip Use as twice daily to check blood sugar.  DX E11.40  . Lancets MISC by Does not apply route. Check blood sugars qam and once daily prn   . Liraglutide (VICTOZA) 18 MG/3ML SOPN Inject 0.3 mLs (1.8 mg total) into the skin daily.  Marland Kitchen lisinopril (PRINIVIL,ZESTRIL) 5 MG tablet TAKE 1 TABLET (5 MG TOTAL) BY MOUTH DAILY.  Marland Kitchen lovastatin (MEVACOR) 20 MG tablet TAKE 1 TABLET BY MOUTH AT BEDTIME  . metFORMIN (GLUCOPHAGE-XR) 500 MG 24 hr tablet 1 tab po q am and 2 tabs po qhs  . NOVOFINE 32G X 6 MM MISC USE WITH VICTOZA  . omeprazole (PRILOSEC) 20 MG capsule TAKE 1 CAPSULE (20 MG TOTAL) BY MOUTH DAILY.  . [DISCONTINUED] zoster vaccine live, PF, (ZOSTAVAX) 28413 UNT/0.65ML injection Inject 19,400 Units into the skin once.  . Zoster Vaccine Live, PF, (ZOSTAVAX) 24401 UNT/0.65ML injection Inject 19,400 Units into the skin once.  . [DISCONTINUED] rOPINIRole (REQUIP) 0.5 MG tablet Take 1-2 tablets (0.5-1 mg total) by mouth at bedtime. (Patient not taking: Reported on 08/08/2016)  . [DISCONTINUED] Zoster Vaccine Live, PF, (ZOSTAVAX) 02725 UNT/0.65ML injection Inject 19,400 Units into the skin once.   No facility-administered encounter medications on file as of 08/08/2016.     Activities of Daily Living In your present state of health, do you have any difficulty performing the following activities: 08/08/2016 02/19/2016  Hearing? N N  Vision? N N  Difficulty concentrating or making decisions? N N  Walking or climbing stairs? Y Y  Dressing or bathing? N N  Doing errands, shopping? N N  Preparing Food and eating ? N -  Using the Toilet? N -  In the past six months, have you accidently leaked urine? Y -  Do you have problems with loss of bowel control? N -  Managing your Medications? N -  Managing your Finances? N -  Housekeeping or managing your Housekeeping? N -  Some recent data might be hidden    Patient Care Team: Mosie Lukes, MD as PCP - General (Family Medicine) Monna Fam, MD as Consulting Physician (Ophthalmology) Melrose Nakayama, MD as Consulting Physician (Orthopedic Surgery)    Assessment:    Physical assessment deferred to PCP.  Exercise Activities and Dietary recommendations Current Exercise Habits: The patient does not participate in regular exercise at present (Mows yard, housekeeping), Exercise limited by: orthopedic condition(s)  Diet (meal preparation, eat out, water intake, caffeinated beverages, dairy products, fruits and vegetables): Meat/fish and potatoes. Prepares own meals, eats mostly at home. Drinks Dr. Malachi Bonds. 5-6 small water bottles daily. Enjoys vegetables. Breakfast: Bagel/cereal Lunch: Sandwich Dinner: Larger meal    Goals    .  Patient Stated (pt-stated)          Lose 30 lbs.      Fall Risk Fall Risk  08/08/2016 02/19/2016 01/23/2015  Falls in the past year? Yes No No  Number falls in past yr: 1 - -  Injury with Fall? No - -  Risk for fall due to : History of fall(s) - -  Follow up Falls prevention discussed - -   Depression Screen PHQ 2/9 Scores 08/08/2016 02/19/2016 01/23/2015  PHQ - 2 Score 0 0 0     Cognitive Testing MMSE - Mini Mental State Exam 08/08/2016  Orientation to time 5  Orientation to Place 5  Registration 3  Attention/ Calculation 5  Recall 3  Language- name 2 objects 2  Language- repeat 1  Language- follow 3 step command 3  Language- read & follow direction 1  Write a sentence 1  Copy design 1  Total score 30    Immunization History  Administered Date(s) Administered  . Influenza Split 10/20/2011, 11/29/2012  . Influenza Whole 08/26/2010  . Influenza,inj,Quad PF,36+ Mos 09/05/2013, 08/28/2014, 11/12/2015  . Pneumococcal Conjugate-13 08/28/2014  . Pneumococcal Polysaccharide-23 11/12/2015   Screening Tests Health Maintenance  Topic Date Due  . FOOT EXAM  09/08/1947  . TETANUS/TDAP  09/07/1956  . ZOSTAVAX  09/07/1997  . OPHTHALMOLOGY EXAM  06/21/2016  . INFLUENZA VACCINE   07/26/2016  . HEMOGLOBIN A1C  11/11/2016  . DEXA SCAN  Completed  . PNA vac Low Risk Adult  Completed   Diabetic Foot Exam - Simple   Simple Foot Form Visual Inspection No deformities, no ulcerations, no other skin breakdown bilaterally:  Yes Sensation Testing Intact to touch and monofilament testing bilaterally:  Yes See comments:  Yes Pulse Check Posterior Tibialis and Dorsalis pulse intact bilaterally:  Yes Comments Decreased sensation in L heel. Per patient, this is an ongoing problem.       Plan:   Continue to eat heart healthy diet (full of fruits, vegetables, whole grains, lean protein, water--limit salt, fat, and sugar intake) and increase physical activity as tolerated.  Continue doing brain stimulating activities (puzzles, reading, adult coloring books, staying active) to keep memory sharp.   Consider creating a living will and healthcare power of attorney. Bring a copy of these documents to your next office visit.  During the course of the visit the patient was educated and counseled about the following appropriate screening and preventive services:   Vaccines to include Pneumoccal, Influenza, Hepatitis B, Td, Zostavax, HCV  Cardiovascular Disease  Colorectal cancer screening  Bone density screening  Diabetes screening  Glaucoma screening  Mammography/PAP  Nutrition counseling   Patient Instructions (the written plan) was given to the patient.   Dorrene German, RN  08/08/2016   RN AWV note reviewed. Agree with documention and plan.  Penni Homans, MD

## 2016-08-05 NOTE — Progress Notes (Signed)
Pre visit review using our clinic review tool, if applicable. No additional management support is needed unless otherwise documented below in the visit note. 

## 2016-08-08 ENCOUNTER — Encounter: Payer: Self-pay | Admitting: *Deleted

## 2016-08-08 ENCOUNTER — Ambulatory Visit (INDEPENDENT_AMBULATORY_CARE_PROVIDER_SITE_OTHER): Payer: Medicare Other | Admitting: *Deleted

## 2016-08-08 ENCOUNTER — Encounter: Payer: Self-pay | Admitting: Family Medicine

## 2016-08-08 VITALS — BP 118/66 | HR 79 | Resp 16 | Ht 63.0 in | Wt 217.2 lb

## 2016-08-08 DIAGNOSIS — Z0000 Encounter for general adult medical examination without abnormal findings: Secondary | ICD-10-CM | POA: Diagnosis not present

## 2016-08-08 DIAGNOSIS — Z23 Encounter for immunization: Secondary | ICD-10-CM | POA: Diagnosis not present

## 2016-08-08 DIAGNOSIS — E782 Mixed hyperlipidemia: Secondary | ICD-10-CM

## 2016-08-08 MED ORDER — ZOSTER VACCINE LIVE 19400 UNT/0.65ML ~~LOC~~ SUSR: 0.6500 mL | Freq: Once | SUBCUTANEOUS | 0 refills | Status: DC

## 2016-08-08 MED ORDER — ZOSTER VACCINE LIVE 19400 UNT/0.65ML ~~LOC~~ SUSR: 0.6500 mL | Freq: Once | SUBCUTANEOUS | 0 refills | Status: AC

## 2016-08-08 NOTE — Assessment & Plan Note (Signed)
On statin. Patient reports leg cramping and myalgias since she's been taking lovastatin. Requesting alternative if appropriate. Labs today and follow-up with PCP for medication management.

## 2016-08-08 NOTE — Patient Instructions (Addendum)
Continue to eat heart healthy diet (full of fruits, vegetables, whole grains, lean protein, water--limit salt, fat, and sugar intake) and increase physical activity as tolerated.  Continue doing brain stimulating activities (puzzles, reading, adult coloring books, staying active) to keep memory sharp.   Consider creating a living will and healthcare power of attorney. Bring a copy of these documents to your next office visit.

## 2016-08-23 ENCOUNTER — Encounter: Payer: Self-pay | Admitting: Family Medicine

## 2016-08-23 ENCOUNTER — Ambulatory Visit (INDEPENDENT_AMBULATORY_CARE_PROVIDER_SITE_OTHER): Payer: Medicare Other | Admitting: Family Medicine

## 2016-08-23 DIAGNOSIS — E119 Type 2 diabetes mellitus without complications: Secondary | ICD-10-CM | POA: Diagnosis not present

## 2016-08-23 DIAGNOSIS — E669 Obesity, unspecified: Secondary | ICD-10-CM

## 2016-08-23 DIAGNOSIS — K219 Gastro-esophageal reflux disease without esophagitis: Secondary | ICD-10-CM | POA: Diagnosis not present

## 2016-08-23 DIAGNOSIS — E782 Mixed hyperlipidemia: Secondary | ICD-10-CM | POA: Diagnosis not present

## 2016-08-23 DIAGNOSIS — I10 Essential (primary) hypertension: Secondary | ICD-10-CM | POA: Diagnosis not present

## 2016-08-23 DIAGNOSIS — M2550 Pain in unspecified joint: Secondary | ICD-10-CM

## 2016-08-23 DIAGNOSIS — E1169 Type 2 diabetes mellitus with other specified complication: Secondary | ICD-10-CM

## 2016-08-23 DIAGNOSIS — Z23 Encounter for immunization: Secondary | ICD-10-CM

## 2016-08-23 DIAGNOSIS — Z0000 Encounter for general adult medical examination without abnormal findings: Secondary | ICD-10-CM

## 2016-08-23 DIAGNOSIS — K429 Umbilical hernia without obstruction or gangrene: Secondary | ICD-10-CM

## 2016-08-23 HISTORY — DX: Umbilical hernia without obstruction or gangrene: K42.9

## 2016-08-23 LAB — COMPREHENSIVE METABOLIC PANEL
ALT: 14 U/L (ref 0–35)
AST: 27 U/L (ref 0–37)
Albumin: 3.8 g/dL (ref 3.5–5.2)
Alkaline Phosphatase: 108 U/L (ref 39–117)
BILIRUBIN TOTAL: 0.5 mg/dL (ref 0.2–1.2)
BUN: 21 mg/dL (ref 6–23)
CALCIUM: 9.4 mg/dL (ref 8.4–10.5)
CHLORIDE: 107 meq/L (ref 96–112)
CO2: 27 meq/L (ref 19–32)
Creatinine, Ser: 1.01 mg/dL (ref 0.40–1.20)
GFR: 56.2 mL/min — AB (ref >60.00)
Glucose, Bld: 112 mg/dL — ABNORMAL HIGH (ref 70–99)
POTASSIUM: 4.4 meq/L (ref 3.5–5.1)
Sodium: 140 mEq/L (ref 135–145)
Total Protein: 7.1 g/dL (ref 6.0–8.3)

## 2016-08-23 LAB — CBC
HCT: 38.7 % (ref 36.0–46.0)
HEMOGLOBIN: 13.1 g/dL (ref 12.0–15.0)
MCHC: 33.8 g/dL (ref 30.0–36.0)
MCV: 93.3 fl (ref 78.0–100.0)
PLATELETS: 179 10*3/uL (ref 150.0–400.0)
RBC: 4.15 Mil/uL (ref 3.87–5.11)
RDW: 13.1 % (ref 11.5–15.5)
WBC: 8.9 10*3/uL (ref 4.0–10.5)

## 2016-08-23 LAB — LIPID PANEL
CHOL/HDL RATIO: 3
Cholesterol: 137 mg/dL (ref 0–200)
HDL: 41.7 mg/dL (ref >39.00)
LDL CALC: 72 mg/dL (ref 0–99)
NonHDL: 95.67
TRIGLYCERIDES: 118 mg/dL (ref 0.0–149.0)
VLDL: 23.6 mg/dL (ref 0.0–40.0)

## 2016-08-23 LAB — HEMOGLOBIN A1C: Hgb A1c MFr Bld: 6.3 % (ref 4.6–6.5)

## 2016-08-23 LAB — TSH: TSH: 3.77 u[IU]/mL (ref 0.35–4.50)

## 2016-08-23 NOTE — Assessment & Plan Note (Signed)
Patient encouraged to maintain heart healthy diet, regular exercise, adequate sleep. Consider daily probiotics. Take medications as prescribed. Given and reviewed copy of ACP documents from  Secretary of State and encouraged to complete and return 

## 2016-08-23 NOTE — Assessment & Plan Note (Addendum)
hgba1c acceptable, minimize simple carbs. Increase exercise as tolerated. Continue current meds. Follows with Dr Herbert Deaner, opthamology, will requests records

## 2016-08-23 NOTE — Progress Notes (Signed)
Patient ID: Stacey Cooper, female   DOB: Jun 17, 1935, 81 y.o.   MRN: [id number]   Subjective:    Patient ID: Stacey Cooper, female    DOB: 16-Apr-1935, 81 y.o.   MRN: [id number]  Chief Complaint  Patient presents with  . Annual Exam    HPI Patient is in today for follow up. She continues to struggle with fatigue and malaise. No recent hospitalization or acute concerns. Denies CP/palp/SOB/HA/congestion/fevers/GI or GU c/o. Taking meds as prescribed  Past Medical History:  Diagnosis Date  . ABDOMINAL PAIN, RIGHT UPPER QUADRANT, HX OF 01/24/2011  . Actinic keratoses 07/18/2011  . Arthralgia 05/03/2015  . Back pain 12/08/2011  . Basal cell carcinoma of skin of lip 01/24/2011  . Blood transfusion without reported diagnosis   . Bronchitis 12/20/2012  . Cataract    bilataeral cateracts removed  . CHOLELITHIASIS, HX OF 02/07/2011  . Chronic kidney disease   . Diabetes mellitus   . Diabetes mellitus type 2 in obese [facility]) 01/24/2011   Qualifier: Diagnosis of  By: [name], [name]  Annual [facility] exam with [facility] on 06/14/13   . DM 01/24/2011  . Essential hypertension, benign 01/24/2011  . GERD 01/24/2011  . Grief reaction 01/20/2014  . HAIR LOSS 01/24/2011  . Hearing loss of both ears 06/28/2013  . Hepatic cirrhosis ([place]) 04/27/2015  . Hyperlipidemia 04/24/2012  . Hyperlipidemia, mixed 04/24/2012  . Insomnia 07/24/2012  . Leg cramps 06/06/2013  . MEASLES, HX OF 01/24/2011  . Morbid obesity ([place]) 01/24/2011  . NONSPEC ELEVATION OF LEVELS OF TRANSAMINASE/LDH 02/07/2011  . Peripheral neuropathy ([place]) 04/20/2011  . Preventative health care 08/23/2016  . Right hip pain 05/03/2015  . SCC (squamous cell carcinoma), face 07/18/2011  . SQUAMOUS CELL CARCINOMA OTHER SPEC SITES SKIN 01/24/2011  . Umbilical hernia [id number]  . URINARY INCONTINENCE 02/07/2011  . Urinary tract infection, site not specified 04/30/2014  . UTI'S, HX OF 01/24/2011    Past Surgical History:  Procedure Laterality Date  . ABDOMINAL  HYSTERECTOMY     partial, both ovaries left in place  . BCC removed below lower lip on left  26 yrs ago  . cataract extraction, b/l    . COLONOSCOPY    . Left knee arthroscopy and cleaning prior to TKR    . skin bx, right anterior CW , squamous cancer, had to redo to margins  12/2010  . total knee replacement, b/l      Family History  Problem Relation Age of Onset  . Arthritis Mother     RA  . Myelodysplastic syndrome Mother   . Cancer Father     skin  . Stroke Father   . Colon cancer Father     63 in larage intes removed carcinoid tumor confined in the tumor  . Stroke Maternal Grandmother   . Kidney failure Maternal Grandfather   . Esophageal cancer Neg Hx   . Rectal cancer Neg Hx   . Stomach cancer Neg Hx     Social History   Social History  . Marital status: Married    Spouse name: N/A  . Number of children: N/A  . Years of education: N/A   Occupational History  . Not on file.   Social History Main Topics  . Smoking status: Never Smoker  . Smokeless tobacco: Never Used  . Alcohol use No  . Drug use: No  . Sexual activity: Yes   Other Topics Concern  . Not on file   Social History  Narrative  . No narrative on file    Outpatient Medications Prior to Visit  Medication Sig Dispense Refill  . glimepiride (AMARYL) 1 MG tablet Take 1 tablet (1 mg total) by mouth daily with breakfast. 90 tablet 1  . glucose blood (FREESTYLE LITE) test strip Use as twice daily to check blood sugar.  DX E11.40 100 each 12  . Lancets MISC by Does not apply route. Check blood sugars qam and once daily prn     . Liraglutide (VICTOZA) 18 MG/3ML SOPN Inject 0.3 mLs (1.8 mg total) into the skin daily. 9 mL 3  . lisinopril (PRINIVIL,ZESTRIL) 5 MG tablet TAKE 1 TABLET (5 MG TOTAL) BY MOUTH DAILY. 90 tablet 1  . lovastatin (MEVACOR) 20 MG tablet TAKE 1 TABLET BY MOUTH AT BEDTIME 90 tablet 0  . metFORMIN (GLUCOPHAGE-XR) 500 MG 24 hr tablet 1 tab po q am and 2 tabs po qhs 270 tablet 1  .  NOVOFINE 32G X 6 MM MISC USE WITH VICTOZA 100 each 6  . omeprazole (PRILOSEC) 20 MG capsule TAKE 1 CAPSULE (20 MG TOTAL) BY MOUTH DAILY. 90 capsule 1   No facility-administered medications prior to visit.     Allergies  Allergen Reactions  . Contrast Media [Iodinated Diagnostic Agents] Shortness Of Breath    Review of Systems  Constitutional: Negative for chills, fever and malaise/fatigue.  HENT: Negative for congestion and hearing loss.   Eyes: Negative for discharge.  Respiratory: Negative for cough, sputum production and shortness of breath.   Cardiovascular: Negative for chest pain, palpitations and leg swelling.  Gastrointestinal: Negative for abdominal pain, blood in stool, constipation, diarrhea, heartburn, nausea and vomiting.  Genitourinary: Negative for dysuria, frequency, hematuria and urgency.  Musculoskeletal: Negative for back pain, falls and myalgias.  Skin: Negative for rash.  Neurological: Negative for dizziness, sensory change, loss of consciousness, weakness and headaches.  Endo/Heme/Allergies: Negative for environmental allergies. Does not bruise/bleed easily.  Psychiatric/Behavioral: Negative for depression and suicidal ideas. The patient is not nervous/anxious and does not have insomnia.        Objective:    Physical Exam  Constitutional: She is oriented to person, place, and time. She appears well-developed and well-nourished. No distress.  HENT:  Head: Normocephalic and atraumatic.  Eyes: Conjunctivae are normal.  Neck: Neck supple. No thyromegaly present.  Cardiovascular: Normal rate, regular rhythm and normal heart sounds.   No murmur heard. Pulmonary/Chest: Effort normal and breath sounds normal. No respiratory distress.  Abdominal: Soft. Bowel sounds are normal. She exhibits no distension and no mass. There is no tenderness.  Umbilical hernia felt with valsalva, nontender reducible  Musculoskeletal: She exhibits no edema.  Lymphadenopathy:    She  has no cervical adenopathy.  Neurological: She is alert and oriented to person, place, and time.  Skin: Skin is warm and dry.  Psychiatric: She has a normal mood and affect. Her behavior is normal.    BP 122/70 (BP Location: Left Arm, Patient Position: Sitting, Cuff Size: Large)   Pulse 70   Temp 97.6 F (36.4 C) (Oral)   Ht 5\' 3"  (1.6 m)   Wt 217 lb 4 oz (98.5 kg)   SpO2 97%   BMI 38.48 kg/m  Wt Readings from Last 3 Encounters:  08/23/16 217 lb 4 oz (98.5 kg)  08/08/16 217 lb 3.2 oz (98.5 kg)  05/19/16 214 lb 2 oz (97.1 kg)     Lab Results  Component Value Date   WBC 8.9 08/23/2016   HGB  13.1 08/23/2016   HCT 38.7 08/23/2016   PLT 179.0 08/23/2016   GLUCOSE 112 (H) 08/23/2016   CHOL 137 08/23/2016   TRIG 118.0 08/23/2016   HDL 41.70 08/23/2016   LDLCALC 72 08/23/2016   ALT 14 08/23/2016   AST 27 08/23/2016   NA 140 08/23/2016   K 4.4 08/23/2016   CL 107 08/23/2016   CREATININE 1.01 08/23/2016   BUN 21 08/23/2016   CO2 27 08/23/2016   TSH 3.77 08/23/2016   INR 1.1 (H) 05/07/2015   HGBA1C 6.3 08/23/2016   MICROALBUR <0.7 11/12/2015    Lab Results  Component Value Date   TSH 3.77 08/23/2016   Lab Results  Component Value Date   WBC 8.9 08/23/2016   HGB 13.1 08/23/2016   HCT 38.7 08/23/2016   MCV 93.3 08/23/2016   PLT 179.0 08/23/2016   Lab Results  Component Value Date   NA 140 08/23/2016   K 4.4 08/23/2016   CO2 27 08/23/2016   GLUCOSE 112 (H) 08/23/2016   BUN 21 08/23/2016   CREATININE 1.01 08/23/2016   BILITOT 0.5 08/23/2016   ALKPHOS 108 08/23/2016   AST 27 08/23/2016   ALT 14 08/23/2016   PROT 7.1 08/23/2016   ALBUMIN 3.8 08/23/2016   CALCIUM 9.4 08/23/2016   GFR 56.20 (L) 08/23/2016   Lab Results  Component Value Date   CHOL 137 08/23/2016   Lab Results  Component Value Date   HDL 41.70 08/23/2016   Lab Results  Component Value Date   LDLCALC 72 08/23/2016   Lab Results  Component Value Date   TRIG 118.0 08/23/2016   Lab  Results  Component Value Date   CHOLHDL 3 08/23/2016   Lab Results  Component Value Date   HGBA1C 6.3 08/23/2016       Assessment & Plan:   Problem List Items Addressed This Visit    Diabetes mellitus type 2 in obese ([place])    hgba1c acceptable, minimize simple carbs. Increase exercise as tolerated. Continue current meds. Follows with Dr Herbert Deaner, opthamology, will requests records      Relevant Orders   Hemoglobin A1c   Hemoglobin A1c (Completed)   MORBID OBESITY - Primary    Encouraged DASH diet, decrease po intake and increase exercise as tolerated. Needs 7-8 hours of sleep nightly. Avoid trans fats, eat small, frequent meals every 4-5 hours with lean proteins, complex carbs and healthy fats. Minimize simple carbs      ESSENTIAL HYPERTENSION, BENIGN    Well controlled, no changes to meds. Encouraged heart healthy diet such as the DASH diet and exercise as tolerated.       Relevant Orders   TSH   CBC   Comprehensive metabolic panel   TSH (Completed)   CBC (Completed)   Comprehensive metabolic panel (Completed)   GERD    Avoid offending foods, start probiotics. Do not eat large meals in late evening and consider raising head of bed.       Relevant Orders   Ambulatory referral to Gastroenterology   Hyperlipidemia, mixed   Relevant Orders   Lipid panel   Lipid panel (Completed)   Arthralgia    Hips, knees, shoulders all throb and are stiff. Worse over the last few months. She thinks worse since increasing dose of Lovastatin. Will hold med for next month and patient will report if pain improves. If no improvement can restart if improvement will switch to a different statin such as Atorvastatin      Preventative  health care    Patient encouraged to maintain heart healthy diet, regular exercise, adequate sleep. Consider daily probiotics. Take medications as prescribed. Given and reviewed copy of ACP documents from [place] and encouraged to complete and  return      Umbilical hernia    Asymptomatic, no need for intervention       Other Visit Diagnoses    Encounter for immunization       Relevant Orders   Flu vaccine HIGH DOSE PF (Completed)      I am having Ms. Disalvatore maintain her Lancets, glucose blood, NOVOFINE, glimepiride, lisinopril, metFORMIN, omeprazole, lovastatin, and Liraglutide.  No orders of the defined types were placed in this encounter.    Penni Homans, MD

## 2016-08-23 NOTE — Assessment & Plan Note (Signed)
Asymptomatic, no need for intervention

## 2016-08-23 NOTE — Assessment & Plan Note (Signed)
Avoid offending foods, start probiotics. Do not eat large meals in late evening and consider raising head of bed.  

## 2016-08-23 NOTE — Assessment & Plan Note (Signed)
Encouraged DASH diet, decrease po intake and increase exercise as tolerated. Needs 7-8 hours of sleep nightly. Avoid trans fats, eat small, frequent meals every 4-5 hours with lean proteins, complex carbs and healthy fats. Minimize simple carbs 

## 2016-08-23 NOTE — Assessment & Plan Note (Signed)
Well controlled, no changes to meds. Encouraged heart healthy diet such as the DASH diet and exercise as tolerated.  °

## 2016-08-23 NOTE — Assessment & Plan Note (Signed)
Hips, knees, shoulders all throb and are stiff. Worse over the last few months. She thinks worse since increasing dose of Lovastatin. Will hold med for next month and patient will report if pain improves. If no improvement can restart if improvement will switch to a different statin such as Atorvastatin

## 2016-08-23 NOTE — Patient Instructions (Signed)
Will hold Lovastatin for next month and patient will report if pain improves. If no improvement can restart if improvement will switch to a different statin such as Atorvastatin  Go to pharmacy in 30 + days to get Zostavax/Shingles shot  Preventive Care for Adults, Female A healthy lifestyle and preventive care can promote health and wellness. Preventive health guidelines for women include the following key practices.  A routine yearly physical is a good way to check with your health care provider about your health and preventive screening. It is a chance to share any concerns and updates on your health and to receive a thorough exam.  Visit your dentist for a routine exam and preventive care every 6 months. Brush your teeth twice a day and floss once a day. Good oral hygiene prevents tooth decay and gum disease.  The frequency of eye exams is based on your age, health, family medical history, use of contact lenses, and other factors. Follow your health care provider's recommendations for frequency of eye exams.  Eat a healthy diet. Foods like vegetables, fruits, whole grains, low-fat dairy products, and lean protein foods contain the nutrients you need without too many calories. Decrease your intake of foods high in solid fats, added sugars, and salt. Eat the right amount of calories for you.Get information about a proper diet from your health care provider, if necessary.  Regular physical exercise is one of the most important things you can do for your health. Most adults should get at least 150 minutes of moderate-intensity exercise (any activity that increases your heart rate and causes you to sweat) each week. In addition, most adults need muscle-strengthening exercises on 2 or more days a week.  Maintain a healthy weight. The body mass index (BMI) is a screening tool to identify possible weight problems. It provides an estimate of body fat based on height and weight. Your health care  provider can find your BMI and can help you achieve or maintain a healthy weight.For adults 20 years and older:  A BMI below 18.5 is considered underweight.  A BMI of 18.5 to 24.9 is normal.  A BMI of 25 to 29.9 is considered overweight.  A BMI of 30 and above is considered obese.  Maintain normal blood lipids and cholesterol levels by exercising and minimizing your intake of saturated fat. Eat a balanced diet with plenty of fruit and vegetables. Blood tests for lipids and cholesterol should begin at age 39 and be repeated every 5 years. If your lipid or cholesterol levels are high, you are over 50, or you are at high risk for heart disease, you may need your cholesterol levels checked more frequently.Ongoing high lipid and cholesterol levels should be treated with medicines if diet and exercise are not working.  If you smoke, find out from your health care provider how to quit. If you do not use tobacco, do not start.  Lung cancer screening is recommended for adults aged 11-80 years who are at high risk for developing lung cancer because of a history of smoking. A yearly low-dose CT scan of the lungs is recommended for people who have at least a 30-pack-year history of smoking and are a current smoker or have quit within the past 15 years. A pack year of smoking is smoking an average of 1 pack of cigarettes a day for 1 year (for example: 1 pack a day for 30 years or 2 packs a day for 15 years). Yearly screening should continue until the  smoker has stopped smoking for at least 15 years. Yearly screening should be stopped for people who develop a health problem that would prevent them from having lung cancer treatment.  If you are pregnant, do not drink alcohol. If you are breastfeeding, be very cautious about drinking alcohol. If you are not pregnant and choose to drink alcohol, do not have more than 1 drink per day. One drink is considered to be 12 ounces (355 mL) of beer, 5 ounces (148 mL) of  wine, or 1.5 ounces (44 mL) of liquor.  Avoid use of street drugs. Do not share needles with anyone. Ask for help if you need support or instructions about stopping the use of drugs.  High blood pressure causes heart disease and increases the risk of stroke. Your blood pressure should be checked at least every 1 to 2 years. Ongoing high blood pressure should be treated with medicines if weight loss and exercise do not work.  If you are 75-64 years old, ask your health care provider if you should take aspirin to prevent strokes.  Diabetes screening is done by taking a blood sample to check your blood glucose level after you have not eaten for a certain period of time (fasting). If you are not overweight and you do not have risk factors for diabetes, you should be screened once every 3 years starting at age 88. If you are overweight or obese and you are 51-20 years of age, you should be screened for diabetes every year as part of your cardiovascular risk assessment.  Breast cancer screening is essential preventive care for women. You should practice "breast self-awareness." This means understanding the normal appearance and feel of your breasts and may include breast self-examination. Any changes detected, no matter how small, should be reported to a health care provider. Women in their 7s and 30s should have a clinical breast exam (CBE) by a health care provider as part of a regular health exam every 1 to 3 years. After age 51, women should have a CBE every year. Starting at age 58, women should consider having a mammogram (breast X-ray test) every year. Women who have a family history of breast cancer should talk to their health care provider about genetic screening. Women at a high risk of breast cancer should talk to their health care providers about having an MRI and a mammogram every year.  Breast cancer gene (BRCA)-related cancer risk assessment is recommended for women who have family members with  BRCA-related cancers. BRCA-related cancers include breast, ovarian, tubal, and peritoneal cancers. Having family members with these cancers may be associated with an increased risk for harmful changes (mutations) in the breast cancer genes BRCA1 and BRCA2. Results of the assessment will determine the need for genetic counseling and BRCA1 and BRCA2 testing.  Your health care provider may recommend that you be screened regularly for cancer of the pelvic organs (ovaries, uterus, and vagina). This screening involves a pelvic examination, including checking for microscopic changes to the surface of your cervix (Pap test). You may be encouraged to have this screening done every 3 years, beginning at age 54.  For women ages 38-65, health care providers may recommend pelvic exams and Pap testing every 3 years, or they may recommend the Pap and pelvic exam, combined with testing for human papilloma virus (HPV), every 5 years. Some types of HPV increase your risk of cervical cancer. Testing for HPV may also be done on women of any age with unclear Pap  test results.  Other health care providers may not recommend any screening for nonpregnant women who are considered low risk for pelvic cancer and who do not have symptoms. Ask your health care provider if a screening pelvic exam is right for you.  If you have had past treatment for cervical cancer or a condition that could lead to cancer, you need Pap tests and screening for cancer for at least 20 years after your treatment. If Pap tests have been discontinued, your risk factors (such as having a new sexual partner) need to be reassessed to determine if screening should resume. Some women have medical problems that increase the chance of getting cervical cancer. In these cases, your health care provider may recommend more frequent screening and Pap tests.  Colorectal cancer can be detected and often prevented. Most routine colorectal cancer screening begins at the age  of 88 years and continues through age 55 years. However, your health care provider may recommend screening at an earlier age if you have risk factors for colon cancer. On a yearly basis, your health care provider may provide home test kits to check for hidden blood in the stool. Use of a small camera at the end of a tube, to directly examine the colon (sigmoidoscopy or colonoscopy), can detect the earliest forms of colorectal cancer. Talk to your health care provider about this at age 60, when routine screening begins. Direct exam of the colon should be repeated every 5-10 years through age 71 years, unless early forms of precancerous polyps or small growths are found.  People who are at an increased risk for hepatitis B should be screened for this virus. You are considered at high risk for hepatitis B if:  You were born in a country where hepatitis B occurs often. Talk with your health care provider about which countries are considered high risk.  Your parents were born in a high-risk country and you have not received a shot to protect against hepatitis B (hepatitis B vaccine).  You have HIV or AIDS.  You use needles to inject street drugs.  You live with, or have sex with, someone who has hepatitis B.  You get hemodialysis treatment.  You take certain medicines for conditions like cancer, organ transplantation, and autoimmune conditions.  Hepatitis C blood testing is recommended for all people born from [birth year] through 1965 and any individual with known risks for hepatitis C.  Practice safe sex. Use condoms and avoid high-risk sexual practices to reduce the spread of sexually transmitted infections (STIs). STIs include gonorrhea, chlamydia, syphilis, trichomonas, herpes, HPV, and human immunodeficiency virus (HIV). Herpes, HIV, and HPV are viral illnesses that have no cure. They can result in disability, cancer, and death.  You should be screened for sexually transmitted illnesses (STIs)  including gonorrhea and chlamydia if:  You are sexually active and are younger than 24 years.  You are older than 24 years and your health care provider tells you that you are at risk for this type of infection.  Your sexual activity has changed since you were last screened and you are at an increased risk for chlamydia or gonorrhea. Ask your health care provider if you are at risk.  If you are at risk of being infected with HIV, it is recommended that you take a prescription medicine daily to prevent HIV infection. This is called preexposure prophylaxis (PrEP). You are considered at risk if:  You are sexually active and do not regularly use condoms or know the  HIV status of your partner(s).  You take drugs by injection.  You are sexually active with a partner who has HIV.  Talk with your health care provider about whether you are at high risk of being infected with HIV. If you choose to begin PrEP, you should first be tested for HIV. You should then be tested every 3 months for as long as you are taking PrEP.  Osteoporosis is a disease in which the bones lose minerals and strength with aging. This can result in serious bone fractures or breaks. The risk of osteoporosis can be identified using a bone density scan. Women ages 66 years and over and women at risk for fractures or osteoporosis should discuss screening with their health care providers. Ask your health care provider whether you should take a calcium supplement or vitamin D to reduce the rate of osteoporosis.  Menopause can be associated with physical symptoms and risks. Hormone replacement therapy is available to decrease symptoms and risks. You should talk to your health care provider about whether hormone replacement therapy is right for you.  Use sunscreen. Apply sunscreen liberally and repeatedly throughout the day. You should seek shade when your shadow is shorter than you. Protect yourself by wearing long sleeves, pants, a  wide-brimmed hat, and sunglasses year round, whenever you are outdoors.  Once a month, do a whole body skin exam, using a mirror to look at the skin on your back. Tell your health care provider of new moles, moles that have irregular borders, moles that are larger than a pencil eraser, or moles that have changed in shape or color.  Stay current with required vaccines (immunizations).  Influenza vaccine. All adults should be immunized every year.  Tetanus, diphtheria, and acellular pertussis (Td, Tdap) vaccine. Pregnant women should receive 1 dose of Tdap vaccine during each pregnancy. The dose should be obtained regardless of the length of time since the last dose. Immunization is preferred during the 27th-36th week of gestation. An adult who has not previously received Tdap or who does not know her vaccine status should receive 1 dose of Tdap. This initial dose should be followed by tetanus and diphtheria toxoids (Td) booster doses every 10 years. Adults with an unknown or incomplete history of completing a 3-dose immunization series with Td-containing vaccines should begin or complete a primary immunization series including a Tdap dose. Adults should receive a Td booster every 10 years.  Varicella vaccine. An adult without evidence of immunity to varicella should receive 2 doses or a second dose if she has previously received 1 dose. Pregnant females who do not have evidence of immunity should receive the first dose after pregnancy. This first dose should be obtained before leaving the health care facility. The second dose should be obtained 4-8 weeks after the first dose.  Human papillomavirus (HPV) vaccine. Females aged 13-26 years who have not received the vaccine previously should obtain the 3-dose series. The vaccine is not recommended for use in pregnant females. However, pregnancy testing is not needed before receiving a dose. If a female is found to be pregnant after receiving a dose, no  treatment is needed. In that case, the remaining doses should be delayed until after the pregnancy. Immunization is recommended for any person with an immunocompromised condition through the age of 58 years if she did not get any or all doses earlier. During the 3-dose series, the second dose should be obtained 4-8 weeks after the first dose. The third dose should be  obtained 24 weeks after the first dose and 16 weeks after the second dose.  Zoster vaccine. One dose is recommended for adults aged 62 years or older unless certain conditions are present.  Measles, mumps, and rubella (MMR) vaccine. Adults born before [birth year] generally are considered immune to measles and mumps. Adults born in [birth year] or later should have 1 or more doses of MMR vaccine unless there is a contraindication to the vaccine or there is laboratory evidence of immunity to each of the three diseases. A routine second dose of MMR vaccine should be obtained at least 28 days after the first dose for students attending postsecondary schools, health care workers, or international travelers. People who received inactivated measles vaccine or an unknown type of measles vaccine during 1963-1967 should receive 2 doses of MMR vaccine. People who received inactivated mumps vaccine or an unknown type of mumps vaccine before 1979 and are at high risk for mumps infection should consider immunization with 2 doses of MMR vaccine. For females of childbearing age, rubella immunity should be determined. If there is no evidence of immunity, females who are not pregnant should be vaccinated. If there is no evidence of immunity, females who are pregnant should delay immunization until after pregnancy. Unvaccinated health care workers born before [birth year] who lack laboratory evidence of measles, mumps, or rubella immunity or laboratory confirmation of disease should consider measles and mumps immunization with 2 doses of MMR vaccine or rubella immunization with 1 dose of  MMR vaccine.  Pneumococcal 13-valent conjugate (PCV13) vaccine. When indicated, a person who is uncertain of his immunization history and has no record of immunization should receive the PCV13 vaccine. All adults 7 years of age and older should receive this vaccine. An adult aged 59 years or older who has certain medical conditions and has not been previously immunized should receive 1 dose of PCV13 vaccine. This PCV13 should be followed with a dose of pneumococcal polysaccharide (PPSV23) vaccine. Adults who are at high risk for pneumococcal disease should obtain the PPSV23 vaccine at least 8 weeks after the dose of PCV13 vaccine. Adults older than 79 years of age who have normal immune system function should obtain the PPSV23 vaccine dose at least 1 year after the dose of PCV13 vaccine.  Pneumococcal polysaccharide (PPSV23) vaccine. When PCV13 is also indicated, PCV13 should be obtained first. All adults aged 54 years and older should be immunized. An adult younger than age 67 years who has certain medical conditions should be immunized. Any person who resides in a nursing home or long-term care facility should be immunized. An adult smoker should be immunized. People with an immunocompromised condition and certain other conditions should receive both PCV13 and PPSV23 vaccines. People with human immunodeficiency virus (HIV) infection should be immunized as soon as possible after diagnosis. Immunization during chemotherapy or radiation therapy should be avoided. Routine use of PPSV23 vaccine is not recommended for American Indians, [place] Natives, or people younger than 65 years unless there are medical conditions that require PPSV23 vaccine. When indicated, people who have unknown immunization and have no record of immunization should receive PPSV23 vaccine. One-time revaccination 5 years after the first dose of PPSV23 is recommended for people aged 19-64 years who have chronic kidney failure, nephrotic  syndrome, asplenia, or immunocompromised conditions. People who received 1-2 doses of PPSV23 before age 37 years should receive another dose of PPSV23 vaccine at age 3 years or later if at least 5 years have passed since the previous dose. Doses  of PPSV23 are not needed for people immunized with PPSV23 at or after age 23 years.  Meningococcal vaccine. Adults with asplenia or persistent complement component deficiencies should receive 2 doses of quadrivalent meningococcal conjugate (MenACWY-D) vaccine. The doses should be obtained at least 2 months apart. Microbiologists working with certain meningococcal bacteria, [place] recruits, people at risk during an outbreak, and people who travel to or live in countries with a high rate of meningitis should be immunized. A first-year college student up through age 39 years who is living in a residence hall should receive a dose if she did not receive a dose on or after her 16th birthday. Adults who have certain high-risk conditions should receive one or more doses of vaccine.  Hepatitis A vaccine. Adults who wish to be protected from this disease, have certain high-risk conditions, work with hepatitis A-infected animals, work in hepatitis A research labs, or travel to or work in countries with a high rate of hepatitis A should be immunized. Adults who were previously unvaccinated and who anticipate close contact with an international adoptee during the first 60 days after arrival in the [place] from a country with a high rate of hepatitis A should be immunized.  Hepatitis B vaccine. Adults who wish to be protected from this disease, have certain high-risk conditions, may be exposed to blood or other infectious body fluids, are household contacts or sex partners of hepatitis B positive people, are clients or workers in certain care facilities, or travel to or work in countries with a high rate of hepatitis B should be immunized.  Haemophilus influenzae type b  (Hib) vaccine. A previously unvaccinated person with asplenia or sickle cell disease or having a scheduled splenectomy should receive 1 dose of Hib vaccine. Regardless of previous immunization, a recipient of a hematopoietic stem cell transplant should receive a 3-dose series 6-12 months after her successful transplant. Hib vaccine is not recommended for adults with HIV infection. Preventive Services / Frequency Ages 64 to 38 years  Blood pressure check.** / Every 3-5 years.  Lipid and cholesterol check.** / Every 5 years beginning at age 80.  Clinical breast exam.** / Every 3 years for women in their 60s and 60s.  BRCA-related cancer risk assessment.** / For women who have family members with a BRCA-related cancer (breast, ovarian, tubal, or peritoneal cancers).  Pap test.** / Every 2 years from ages 37 through 91. Every 3 years starting at age 32 through age 51 or 70 with a history of 3 consecutive normal Pap tests.  HPV screening.** / Every 3 years from ages 57 through ages 76 to 52 with a history of 3 consecutive normal Pap tests.  Hepatitis C blood test.** / For any individual with known risks for hepatitis C.  Skin self-exam. / Monthly.  Influenza vaccine. / Every year.  Tetanus, diphtheria, and acellular pertussis (Tdap, Td) vaccine.** / Consult your health care provider. Pregnant women should receive 1 dose of Tdap vaccine during each pregnancy. 1 dose of Td every 10 years.  Varicella vaccine.** / Consult your health care provider. Pregnant females who do not have evidence of immunity should receive the first dose after pregnancy.  HPV vaccine. / 3 doses over 6 months, if 44 and younger. The vaccine is not recommended for use in pregnant females. However, pregnancy testing is not needed before receiving a dose.  Measles, mumps, rubella (MMR) vaccine.** / You need at least 1 dose of MMR if you were born in 1957 or later.  You may also need a 2nd dose. For females of childbearing  age, rubella immunity should be determined. If there is no evidence of immunity, females who are not pregnant should be vaccinated. If there is no evidence of immunity, females who are pregnant should delay immunization until after pregnancy.  Pneumococcal 13-valent conjugate (PCV13) vaccine.** / Consult your health care provider.  Pneumococcal polysaccharide (PPSV23) vaccine.** / 1 to 2 doses if you smoke cigarettes or if you have certain conditions.  Meningococcal vaccine.** / 1 dose if you are age 67 to 23 years and a Orthoptist living in a residence hall, or have one of several medical conditions, you need to get vaccinated against meningococcal disease. You may also need additional booster doses.  Hepatitis A vaccine.** / Consult your health care provider.  Hepatitis B vaccine.** / Consult your health care provider.  Haemophilus influenzae type b (Hib) vaccine.** / Consult your health care provider. Ages 62 to 64 years  Blood pressure check.** / Every year.  Lipid and cholesterol check.** / Every 5 years beginning at age 22 years.  Lung cancer screening. / Every year if you are aged 55-80 years and have a 30-pack-year history of smoking and currently smoke or have quit within the past 15 years. Yearly screening is stopped once you have quit smoking for at least 15 years or develop a health problem that would prevent you from having lung cancer treatment.  Clinical breast exam.** / Every year after age 64 years.  BRCA-related cancer risk assessment.** / For women who have family members with a BRCA-related cancer (breast, ovarian, tubal, or peritoneal cancers).  Mammogram.** / Every year beginning at age 79 years and continuing for as long as you are in good health. Consult with your health care provider.  Pap test.** / Every 3 years starting at age 73 years through age 11 or 77 years with a history of 3 consecutive normal Pap tests.  HPV screening.** / Every 3 years  from ages 44 years through ages 68 to 63 years with a history of 3 consecutive normal Pap tests.  Fecal occult blood test (FOBT) of stool. / Every year beginning at age 50 years and continuing until age 1 years. You may not need to do this test if you get a colonoscopy every 10 years.  Flexible sigmoidoscopy or colonoscopy.** / Every 5 years for a flexible sigmoidoscopy or every 10 years for a colonoscopy beginning at age 48 years and continuing until age 52 years.  Hepatitis C blood test.** / For all people born from [birth year] through 1963 and any individual with known risks for hepatitis C.  Skin self-exam. / Monthly.  Influenza vaccine. / Every year.  Tetanus, diphtheria, and acellular pertussis (Tdap/Td) vaccine.** / Consult your health care provider. Pregnant women should receive 1 dose of Tdap vaccine during each pregnancy. 1 dose of Td every 10 years.  Varicella vaccine.** / Consult your health care provider. Pregnant females who do not have evidence of immunity should receive the first dose after pregnancy.  Zoster vaccine.** / 1 dose for adults aged 37 years or older.  Measles, mumps, rubella (MMR) vaccine.** / You need at least 1 dose of MMR if you were born in 1957 or later. You may also need a second dose. For females of childbearing age, rubella immunity should be determined. If there is no evidence of immunity, females who are not pregnant should be vaccinated. If there is no evidence of immunity, females who are pregnant  should delay immunization until after pregnancy.  Pneumococcal 13-valent conjugate (PCV13) vaccine.** / Consult your health care provider.  Pneumococcal polysaccharide (PPSV23) vaccine.** / 1 to 2 doses if you smoke cigarettes or if you have certain conditions.  Meningococcal vaccine.** / Consult your health care provider.  Hepatitis A vaccine.** / Consult your health care provider.  Hepatitis B vaccine.** / Consult your health care provider.  Haemophilus  influenzae type b (Hib) vaccine.** / Consult your health care provider. Ages 5 years and over  Blood pressure check.** / Every year.  Lipid and cholesterol check.** / Every 5 years beginning at age 59 years.  Lung cancer screening. / Every year if you are aged 61-80 years and have a 30-pack-year history of smoking and currently smoke or have quit within the past 15 years. Yearly screening is stopped once you have quit smoking for at least 15 years or develop a health problem that would prevent you from having lung cancer treatment.  Clinical breast exam.** / Every year after age 58 years.  BRCA-related cancer risk assessment.** / For women who have family members with a BRCA-related cancer (breast, ovarian, tubal, or peritoneal cancers).  Mammogram.** / Every year beginning at age 96 years and continuing for as long as you are in good health. Consult with your health care provider.  Pap test.** / Every 3 years starting at age 51 years through age 79 or 33 years with 3 consecutive normal Pap tests. Testing can be stopped between 65 and 70 years with 3 consecutive normal Pap tests and no abnormal Pap or HPV tests in the past 10 years.  HPV screening.** / Every 3 years from ages 72 years through ages 55 or 53 years with a history of 3 consecutive normal Pap tests. Testing can be stopped between 65 and 70 years with 3 consecutive normal Pap tests and no abnormal Pap or HPV tests in the past 10 years.  Fecal occult blood test (FOBT) of stool. / Every year beginning at age 11 years and continuing until age 50 years. You may not need to do this test if you get a colonoscopy every 10 years.  Flexible sigmoidoscopy or colonoscopy.** / Every 5 years for a flexible sigmoidoscopy or every 10 years for a colonoscopy beginning at age 25 years and continuing until age 26 years.  Hepatitis C blood test.** / For all people born from [birth year] through 1963 and any individual with known risks for hepatitis  C.  Osteoporosis screening.** / A one-time screening for women ages 47 years and over and women at risk for fractures or osteoporosis.  Skin self-exam. / Monthly.  Influenza vaccine. / Every year.  Tetanus, diphtheria, and acellular pertussis (Tdap/Td) vaccine.** / 1 dose of Td every 10 years.  Varicella vaccine.** / Consult your health care provider.  Zoster vaccine.** / 1 dose for adults aged 34 years or older.  Pneumococcal 13-valent conjugate (PCV13) vaccine.** / Consult your health care provider.  Pneumococcal polysaccharide (PPSV23) vaccine.** / 1 dose for all adults aged 63 years and older.  Meningococcal vaccine.** / Consult your health care provider.  Hepatitis A vaccine.** / Consult your health care provider.  Hepatitis B vaccine.** / Consult your health care provider.  Haemophilus influenzae type b (Hib) vaccine.** / Consult your health care provider. ** Family history and personal history of risk and conditions may change your health care provider's recommendations.   This information is not intended to replace advice given to you by your health care provider. Make  sure you discuss any questions you have with your health care provider.   Document Released: 02/07/2002 Document Revised: 01/02/2015 Document Reviewed: 05/09/2011 Elsevier Interactive Patient Education [employer]. Will hold med for next month and patient will report if pain improves. If no improvement can restart if improvement will switch to a different statin such as Atorvastatin

## 2016-08-23 NOTE — Progress Notes (Signed)
Pre visit review using our clinic review tool, if applicable. No additional management support is needed unless otherwise documented below in the visit note. 

## 2016-09-09 ENCOUNTER — Other Ambulatory Visit: Payer: Self-pay | Admitting: Family Medicine

## 2016-09-09 DIAGNOSIS — E118 Type 2 diabetes mellitus with unspecified complications: Secondary | ICD-10-CM

## 2016-09-20 ENCOUNTER — Telehealth: Payer: Self-pay

## 2016-09-20 NOTE — Telephone Encounter (Signed)
Rx faxed for freestyle test strips to [facility] [phone]) PC

## 2016-10-10 ENCOUNTER — Telehealth: Payer: Self-pay | Admitting: Family Medicine

## 2016-10-10 MED ORDER — INSULIN PEN NEEDLE 32G X 6 MM MISC: 6 refills | Status: DC

## 2016-10-10 NOTE — Telephone Encounter (Signed)
Patient has tried twice now to take Lovastatin and continues to cause body aches.  PCP is aware of problem and at her last OV informed her there are other alteratives.  She would like to try something else.  Also she did mention when on lovastatin the one positive is that swelling in her legs/feet go down, but once she stops it comes back. Advise please on alternative.  Patient is going out of town on Thursday for a month and would like something elseo try. Call back numbers are home [phone] or cell [phone]

## 2016-10-11 NOTE — Telephone Encounter (Signed)
Could consider zetia for her.  Unsure if recent lipid panel was on or off statin.  However need to consider if a non- statin drug will be beneficial for her.  Called and LMOM on her cell.  Will try her back

## 2016-10-12 NOTE — Telephone Encounter (Signed)
Called her back and did reach her- she states that she is leaving for the beach in the morning and will not be able to get any rx before she leaves.  Advised her to see [name] when she comes home

## 2016-11-21 ENCOUNTER — Ambulatory Visit: Payer: Medicare Other | Admitting: Gastroenterology

## 2016-11-21 ENCOUNTER — Ambulatory Visit (INDEPENDENT_AMBULATORY_CARE_PROVIDER_SITE_OTHER): Payer: Medicare Other | Admitting: Gastroenterology

## 2016-11-21 ENCOUNTER — Encounter (INDEPENDENT_AMBULATORY_CARE_PROVIDER_SITE_OTHER): Payer: Self-pay

## 2016-11-21 ENCOUNTER — Encounter: Payer: Self-pay | Admitting: Gastroenterology

## 2016-11-21 ENCOUNTER — Other Ambulatory Visit (INDEPENDENT_AMBULATORY_CARE_PROVIDER_SITE_OTHER): Payer: Medicare Other

## 2016-11-21 VITALS — BP 122/60 | HR 84 | Ht 62.21 in | Wt 221.0 lb

## 2016-11-21 DIAGNOSIS — K7460 Unspecified cirrhosis of liver: Secondary | ICD-10-CM

## 2016-11-21 DIAGNOSIS — K219 Gastro-esophageal reflux disease without esophagitis: Secondary | ICD-10-CM | POA: Diagnosis not present

## 2016-11-21 DIAGNOSIS — Z86010 Personal history of colonic polyps: Secondary | ICD-10-CM

## 2016-11-21 LAB — PROTIME-INR
INR: 1 ratio (ref 0.8–1.0)
Prothrombin Time: 10.9 s (ref 9.6–13.1)

## 2016-11-21 NOTE — Patient Instructions (Signed)
Your physician has requested that you go to the basement for lab work before leaving today.  You have been scheduled for an abdominal ultrasound at [facility] (1st floor of hospital) on 11/28/16 at 8:00am. Please arrive 15 minutes prior to your appointment for registration. Make certain not to have anything to eat or drink 6 hours prior to your appointment. Should you need to reschedule your appointment, please contact radiology at [phone]. This test typically takes about 30 minutes to perform.  Thank you for choosing me and [facility].  Pricilla Riffle. Dagoberto Ligas., MD., Marval Regal

## 2016-11-21 NOTE — Progress Notes (Signed)
History of Present Illness: This is a 81 year old female returning for follow-up of cirrhosis. Hepatic evaluation performed in 2016 outlined below. She had presumed NASH cirrhosis. She has no ongoing gastrointestinal complaints except for an umbilical hernia. Her reflux symptoms are controlled on her current regimen. Blood work from 07/2016 reviewed.   ANA, AFP, A-1 antitrypsin, ceruloplasmin, smooth muscle Ab, hep B & C: all normal or negative mitochondrial Ab: mildly elevated   Colonoscopy 04/2015 ENDOSCOPIC IMPRESSION:  1. Two sessile polyps in the ascending colon; polypectomies performed using snare cautery and with a cold snare-tubular adenoma and lipoma 2. Two angiodysplastic lesions in the transverse colon and ascending colon 3. External hemorrhoids  EGD 04/2015 ENDOSCOPIC IMPRESSION: 1. Normal appearing EGD  Allergies  Allergen Reactions  . Contrast Media [Iodinated Diagnostic Agents] Shortness Of Breath   Outpatient Medications Prior to Visit  Medication Sig Dispense Refill  . FREESTYLE LITE test strip USE TO TEST BLOOD SUGAR TWICE DAILY AS DIRECTED 100 each 6  . glimepiride (AMARYL) 1 MG tablet TAKE 1 TABLET(1 MG) BY MOUTH DAILY WITH BREAKFAST 90 tablet 0  . Insulin Pen Needle (NOVOFINE) 32G X 6 MM MISC Use with Victoza 100 each 6  . Lancets MISC by Does not apply route. Check blood sugars qam and once daily prn     . Liraglutide (VICTOZA) 18 MG/3ML SOPN Inject 0.3 mLs (1.8 mg total) into the skin daily. 9 mL 3  . lisinopril (PRINIVIL,ZESTRIL) 5 MG tablet TAKE 1 TABLET(5 MG) BY MOUTH DAILY 90 tablet 0  . metFORMIN (GLUCOPHAGE-XR) 500 MG 24 hr tablet 1 tab po q am and 2 tabs po qhs 270 tablet 1  . omeprazole (PRILOSEC) 20 MG capsule TAKE 1 CAPSULE (20 MG TOTAL) BY MOUTH DAILY. 90 capsule 1  . lovastatin (MEVACOR) 20 MG tablet TAKE 1 TABLET BY MOUTH AT BEDTIME (Patient not taking: Reported on 11/21/2016) 90 tablet 0   No facility-administered medications prior to visit.     Past Medical History:  Diagnosis Date  . ABDOMINAL PAIN, RIGHT UPPER QUADRANT, HX OF 01/24/2011  . Actinic keratoses 07/18/2011  . Arthralgia 05/03/2015  . Back pain 12/08/2011  . Basal cell carcinoma of skin of lip 01/24/2011  . Blood transfusion without reported diagnosis   . Bronchitis 12/20/2012  . Cataract    bilataeral cateracts removed  . CHOLELITHIASIS, HX OF 02/07/2011  . Chronic kidney disease   . Diabetes mellitus   . Diabetes mellitus type 2 in obese [facility]) 01/24/2011   Qualifier: Diagnosis of  By: [name], [name]  Annual [facility] exam with [facility] on 06/14/13   . DM 01/24/2011  . Essential hypertension, benign 01/24/2011  . GERD 01/24/2011  . Grief reaction 01/20/2014  . HAIR LOSS 01/24/2011  . Hearing loss of both ears 06/28/2013  . Hepatic cirrhosis ([place]) 04/27/2015  . Hyperlipidemia 04/24/2012  . Hyperlipidemia, mixed 04/24/2012  . Insomnia 07/24/2012  . Leg cramps 06/06/2013  . MEASLES, HX OF 01/24/2011  . Morbid obesity ([place]) 01/24/2011  . NONSPEC ELEVATION OF LEVELS OF TRANSAMINASE/LDH 02/07/2011  . Peripheral neuropathy ([place]) 04/20/2011  . Preventative health care 08/23/2016  . Right hip pain 05/03/2015  . SCC (squamous cell carcinoma), face 07/18/2011  . SQUAMOUS CELL CARCINOMA OTHER SPEC SITES SKIN 01/24/2011  . Tubular adenoma of colon 04/2015  . Umbilical hernia 123456  . URINARY INCONTINENCE 02/07/2011  . Urinary tract infection, site not specified 04/30/2014  . UTI'S, HX OF 01/24/2011   Past Surgical History:  Procedure  Laterality Date  . ABDOMINAL HYSTERECTOMY     partial, both ovaries left in place  . BCC removed below lower lip on left  26 yrs ago  . cataract extraction, b/l    . COLONOSCOPY    . Left knee arthroscopy and cleaning prior to TKR    . skin bx, right anterior CW , squamous cancer, had to redo to margins  12/2010  . total knee replacement, b/l     Social History   Social History  . Marital status: Married    Spouse name: N/A  . Number of  children: N/A  . Years of education: N/A   Occupational History  . retired    Social History Main Topics  . Smoking status: Never Smoker  . Smokeless tobacco: Never Used  . Alcohol use No  . Drug use: No  . Sexual activity: Yes   Other Topics Concern  . None   Social History Narrative  . None   Family History  Problem Relation Age of Onset  . Arthritis Mother     RA  . Myelodysplastic syndrome Mother   . Cancer Father     skin  . Stroke Father   . Colon polyps Father     59 in [place] intes removed carcinoid tumor confined in the tumor  . Stroke Maternal Grandmother   . Kidney failure Maternal Grandfather   . Esophageal cancer Neg Hx   . Rectal cancer Neg Hx   . Stomach cancer Neg Hx       Physical Exam: General: Well developed, well nourished, obese, no acute distress Head: Normocephalic and atraumatic Eyes:  sclerae anicteric, EOMI Ears: Normal auditory acuity Mouth: No deformity or lesions Lungs: Clear throughout to auscultation Heart: Regular rate and rhythm; no murmurs, rubs or bruits Abdomen: Soft, non tender and non distended. No masses, hepatosplenomegaly or hernias noted. Normal Bowel sounds Musculoskeletal: Symmetrical with no gross deformities  Pulses:  Normal pulses noted Extremities: No clubbing, cyanosis, edema or deformities noted Neurological: Alert oriented x 4, grossly nonfocal Psychological:  Alert and cooperative. Normal mood and affect  Assessment and Recommendations:  1. Cirrhosis. Suspected NASH. Screen for [place]. Encouraged a long-term low fat, carb modified, weight loss diet supervised by her PCP. Optimal control of DM. Schedule abdominal ultrasound, PT/INR and AFP today. Return office visit one year.  2. Personal history of adenomatous colon polyps. No future surveillance colonoscopies planned due to age.  3. GERD. Continue omeprazole 20 mg daily and standard antireflux measures.

## 2016-11-22 ENCOUNTER — Other Ambulatory Visit: Payer: Self-pay | Admitting: Family Medicine

## 2016-11-22 ENCOUNTER — Other Ambulatory Visit: Payer: Self-pay

## 2016-11-22 DIAGNOSIS — K7469 Other cirrhosis of liver: Secondary | ICD-10-CM

## 2016-11-22 LAB — AFP TUMOR MARKER: AFP TUMOR MARKER: 5.1 ng/mL (ref <6.1)

## 2016-11-28 ENCOUNTER — Ambulatory Visit (HOSPITAL_COMMUNITY)
Admission: RE | Admit: 2016-11-28 | Discharge: 2016-11-28 | Disposition: A | Discharge status: Home / Self Care | Payer: Medicare Other | Source: Ambulatory Visit | Attending: Gastroenterology | Admitting: Gastroenterology

## 2016-11-28 DIAGNOSIS — K7460 Unspecified cirrhosis of liver: Secondary | ICD-10-CM | POA: Insufficient documentation

## 2016-11-28 DIAGNOSIS — K8020 Calculus of gallbladder without cholecystitis without obstruction: Secondary | ICD-10-CM | POA: Insufficient documentation

## 2016-12-13 ENCOUNTER — Other Ambulatory Visit: Payer: Self-pay | Admitting: Family Medicine

## 2017-01-06 ENCOUNTER — Encounter: Payer: Self-pay | Admitting: Physician Assistant

## 2017-01-06 ENCOUNTER — Ambulatory Visit (INDEPENDENT_AMBULATORY_CARE_PROVIDER_SITE_OTHER): Payer: Medicare Other | Admitting: Physician Assistant

## 2017-01-06 ENCOUNTER — Other Ambulatory Visit: Payer: Self-pay | Admitting: Physician Assistant

## 2017-01-06 ENCOUNTER — Other Ambulatory Visit (HOSPITAL_COMMUNITY)
Admission: RE | Admit: 2017-01-06 | Discharge: 2017-01-06 | Disposition: A | Discharge status: Home / Self Care | Payer: Medicare Other | Source: Ambulatory Visit | Attending: Physician Assistant | Admitting: Physician Assistant

## 2017-01-06 VITALS — BP 104/52 | HR 88 | Temp 98.1°F | Resp 18 | Ht 63.0 in | Wt 216.2 lb

## 2017-01-06 DIAGNOSIS — N9089 Other specified noninflammatory disorders of vulva and perineum: Secondary | ICD-10-CM | POA: Diagnosis not present

## 2017-01-06 DIAGNOSIS — N3090 Cystitis, unspecified without hematuria: Secondary | ICD-10-CM

## 2017-01-06 DIAGNOSIS — R300 Dysuria: Secondary | ICD-10-CM | POA: Diagnosis present

## 2017-01-06 DIAGNOSIS — J0100 Acute maxillary sinusitis, unspecified: Secondary | ICD-10-CM

## 2017-01-06 DIAGNOSIS — N909 Noninflammatory disorder of vulva and perineum, unspecified: Secondary | ICD-10-CM | POA: Insufficient documentation

## 2017-01-06 LAB — POCT URINALYSIS DIPSTICK
Bilirubin, UA: NEGATIVE
Glucose, UA: NEGATIVE
KETONES UA: NEGATIVE
Nitrite, UA: NEGATIVE
PROTEIN UA: NEGATIVE
Spec Grav, UA: >=1.03
Urobilinogen, UA: NEGATIVE
pH, UA: 5.5

## 2017-01-06 MED ORDER — FLUTICASONE PROPIONATE 50 MCG/ACT NA SUSP: 2.0000 | Freq: Every day | NASAL | 1 refills | Status: DC

## 2017-01-06 MED ORDER — LIDOCAINE HCL 2 % EX GEL: 1.0000 "application " | CUTANEOUS | 0 refills | Status: DC | PRN

## 2017-01-06 MED ORDER — AMOXICILLIN-POT CLAVULANATE 875-125 MG PO TABS: 1.0000 | ORAL_TABLET | Freq: Two times a day (BID) | ORAL | 0 refills | Status: DC

## 2017-01-06 NOTE — Progress Notes (Addendum)
Subjective:    Patient ID: Stacey Cooper, female    DOB: 08/12/1935, 82 y.o.   MRN: [id number]  HPI  Stacey Cooper is a 82 y/o female who presents with chief complaint of:  1. Dysuria - she has had painful urination x 3 days. She says it appears dark. Denies back pain, fever, chills, nausea, vomiting, vaginal discharge. She is not taking any medicines OTC for her symptoms. She does state that she has been wearing a pad for a few days because she has been coughing and has had some leaking when she coughs.  2. Facial pressure - she has had pressure under her eyes x 1 week, with cough x 6 weeks. She tried to take Mucinex but it didn't help. She denies fevers, chills, SOB, chest pain, poor appetite.  She is a diabetic, she states that her blood sugars have been running 97 - 109 when she has checked them over the past week.  Review of Systems  See HPI  Past Medical History:  Diagnosis Date  . ABDOMINAL PAIN, RIGHT UPPER QUADRANT, HX OF 01/24/2011  . Actinic keratoses 07/18/2011  . Arthralgia 05/03/2015  . Back pain 12/08/2011  . Basal cell carcinoma of skin of lip 01/24/2011  . Blood transfusion without reported diagnosis   . Bronchitis 12/20/2012  . Cataract    bilataeral cateracts removed  . CHOLELITHIASIS, HX OF 02/07/2011  . Chronic kidney disease   . Diabetes mellitus   . Diabetes mellitus type 2 in obese [facility]) 01/24/2011   Qualifier: Diagnosis of  By: [name], [name]  Annual eye exam with [facility] on 06/14/13   . DM 01/24/2011  . Essential hypertension, benign 01/24/2011  . GERD 01/24/2011  . Grief reaction 01/20/2014  . HAIR LOSS 01/24/2011  . Hearing loss of both ears 06/28/2013  . Hepatic cirrhosis ([place]) 04/27/2015  . Hyperlipidemia 04/24/2012  . Hyperlipidemia, mixed 04/24/2012  . Insomnia 07/24/2012  . Leg cramps 06/06/2013  . MEASLES, HX OF 01/24/2011  . Morbid obesity ([place]) 01/24/2011  . NONSPEC ELEVATION OF LEVELS OF TRANSAMINASE/LDH 02/07/2011  . Peripheral neuropathy  ([place]) 04/20/2011  . Preventative health care 08/23/2016  . Right hip pain 05/03/2015  . SCC (squamous cell carcinoma), face 07/18/2011  . SQUAMOUS CELL CARCINOMA OTHER SPEC SITES SKIN 01/24/2011  . Tubular adenoma of colon 04/2015  . Umbilical hernia [id number]  . URINARY INCONTINENCE 02/07/2011  . Urinary tract infection, site not specified 04/30/2014  . UTI'S, HX OF 01/24/2011     Social History   Social History  . Marital status: Married    Spouse name: N/A  . Number of children: N/A  . Years of education: N/A   Occupational History  . retired    Social History Main Topics  . Smoking status: Never Smoker  . Smokeless tobacco: Never Used  . Alcohol use No  . Drug use: No  . Sexual activity: Yes   Other Topics Concern  . Not on file   Social History Narrative  . No narrative on file    Past Surgical History:  Procedure Laterality Date  . ABDOMINAL HYSTERECTOMY     partial, both ovaries left in place  . BCC removed below lower lip on left  26 yrs ago  . cataract extraction, b/l    . COLONOSCOPY    . Left knee arthroscopy and cleaning prior to TKR    . skin bx, right anterior CW , squamous cancer, had to redo to margins  12/2010  .  total knee replacement, b/l      Family History  Problem Relation Age of Onset  . Arthritis Mother     RA  . Myelodysplastic syndrome Mother   . Cancer Father     skin  . Stroke Father   . Colon polyps Father     73 in [place] intes removed carcinoid tumor confined in the tumor  . Stroke Maternal Grandmother   . Kidney failure Maternal Grandfather   . Esophageal cancer Neg Hx   . Rectal cancer Neg Hx   . Stomach cancer Neg Hx     Allergies  Allergen Reactions  . Contrast Media [Iodinated Diagnostic Agents] Shortness Of Breath    Current Outpatient Prescriptions on File Prior to Visit  Medication Sig Dispense Refill  . FREESTYLE LITE test strip USE TO TEST BLOOD SUGAR TWICE DAILY AS DIRECTED 100 each 6  . glimepiride (AMARYL) 1 MG  tablet TAKE 1 TABLET(1 MG) BY MOUTH DAILY WITH BREAKFAST 90 tablet 0  . Insulin Pen Needle (NOVOFINE) 32G X 6 MM MISC Use with Victoza 100 each 6  . Lancets MISC by Does not apply route. Check blood sugars qam and once daily prn     . Liraglutide (VICTOZA) 18 MG/3ML SOPN Inject 0.3 mLs (1.8 mg total) into the skin daily. 9 mL 3  . lisinopril (PRINIVIL,ZESTRIL) 5 MG tablet TAKE 1 TABLET(5 MG) BY MOUTH DAILY 90 tablet 0  . lovastatin (MEVACOR) 20 MG tablet TAKE 1 TABLET BY MOUTH AT BEDTIME 90 tablet 0  . metFORMIN (GLUCOPHAGE-XR) 500 MG 24 hr tablet TAKE 1 TABLET BY MOUTH IN THE MORNING AND 2 TABLETS EVERY NIGHT AT BEDTIME 270 tablet 0  . omeprazole (PRILOSEC) 20 MG capsule TAKE 1 CAPSULE (20 MG TOTAL) BY MOUTH DAILY. 90 capsule 1   No current facility-administered medications on file prior to visit.     BP (!) 104/52 (BP Location: Left Arm, Cuff Size: Large)   Pulse 88   Temp 98.1 F (36.7 C) (Oral)   Resp 18   Ht 5\' 3"  (1.6 m)   Wt 216 lb 3.2 oz (98.1 kg)   SpO2 97% Comment: room air  BMI 38.30 kg/m      Objective:   Physical Exam  Constitutional: She appears well-developed and well-nourished. She is cooperative.  HENT:  Head: Normocephalic and atraumatic.  Right Ear: Tympanic membrane, external ear and ear canal normal. Tympanic membrane is not erythematous, not retracted and not bulging.  Left Ear: Tympanic membrane, external ear and ear canal normal. Tympanic membrane is not erythematous, not retracted and not bulging.  Nose: Right sinus exhibits maxillary sinus tenderness. Left sinus exhibits maxillary sinus tenderness.  Mouth/Throat: Uvula is midline. Posterior oropharyngeal erythema present. No posterior oropharyngeal edema.  Cardiovascular: Normal rate, regular rhythm and normal heart sounds.   Pulmonary/Chest: Effort normal and breath sounds normal. She has no wheezes. She has no rhonchi. She has no rales.  Abdominal: Normal appearance. There is no tenderness. There is no  CVA tenderness.  Genitourinary:    There is lesion on the right labia. There is lesion on the left labia. There is tenderness in the vagina.  Neurological: She is alert.  Nursing note and vitals reviewed.      Assessment & Plan:  1. Cystitis and acute non-recurrent maxillary sinusitis UA consistent with cystitis. Will use Augmentin to treat for cystitis and sinusitis. Will send for culture and alter antibiotic if warranted.  - POCT Urinalysis Dipstick (CPT 81002)  3.  Vulvar lesions Wet prep performed and pending at this time. Possible acute vulvar aphthous ulcers, refer to Obstetrics / Gynecology for second opinion and further evaluation and management. Discussed sitz baths, gentle mild soap, and avoiding use of sanitary pads. Zinc oxide for barrier protection, lidocaine jelly for discomfort and may use a peri-bottle while urinating.  Inda Coke PA-C 01/06/17

## 2017-01-06 NOTE — Patient Instructions (Signed)
We are going to treat your sinus infection and urinary tract infection with an antibiotic. Please take as prescribed. Take with food to prevent upset stomach.  Use the flonase to help with your facial pressure.  I'm setting you up with OB-Gyn to have them evaluate your skin irritation. You may also try taking a sitz bath.   How to Take a Sitz Bath A sitz bath is a warm water bath that is taken while you are sitting down. The water should only come up to your hips and should cover your buttocks. Your health care provider may recommend a sitz bath to help you:  Clean the lower part of your body, including your genital area.  With itching.  With pain.  With sore muscles or muscles that tighten or spasm. How to take a sitz bath Take 3-4 sitz baths per day or as told by your health care provider. 1. Partially fill a bathtub with warm water. You will only need the water to be deep enough to cover your hips and buttocks when you are sitting in it. 2. If your health care provider told you to put medicine in the water, follow the directions exactly. 3. Sit in the water and open the tub drain a little. 4. Turn on the warm water again to keep the tub at the correct level. Keep the water running constantly. 5. Soak in the water for 15-20 minutes or as told by your health care provider. 6. After the sitz bath, pat the affected area dry first. Do not rub it. 7. Be careful when you stand up after the sitz bath because you may feel dizzy. Contact a health care provider if:  Your symptoms get worse. Do not continue with sitz baths if your symptoms get worse.  You have new symptoms. Do not continue with sitz baths until you talk with your health care provider. This information is not intended to replace advice given to you by your health care provider. Make sure you discuss any questions you have with your health care provider. Document Released: 09/03/2004 Document Revised: 05/11/2016 Document  Reviewed: 12/10/2014 Elsevier Interactive Patient Education  2017 [employer]. try sitz baths. Avoid wearing pads to prevent worsening of skin irritation.

## 2017-01-06 NOTE — Addendum Note (Signed)
Addended by: Erlene Quan on: 01/06/2017 08:31 AM   Modules accepted: Orders

## 2017-01-06 NOTE — Progress Notes (Signed)
Pre visit review using our clinic review tool, if applicable. No additional management support is needed unless otherwise documented below in the visit note. 

## 2017-01-08 LAB — URINE CULTURE

## 2017-01-09 ENCOUNTER — Other Ambulatory Visit: Payer: Self-pay | Admitting: Physician Assistant

## 2017-01-09 ENCOUNTER — Telehealth: Payer: Self-pay | Admitting: Family Medicine

## 2017-01-09 LAB — CERVICOVAGINAL ANCILLARY ONLY: Wet Prep (BD Affirm): POSITIVE — AB

## 2017-01-09 MED ORDER — METRONIDAZOLE 500 MG PO TABS: 500.0000 mg | ORAL_TABLET | Freq: Two times a day (BID) | ORAL | 0 refills | Status: AC

## 2017-01-09 MED ORDER — FLUCONAZOLE 150 MG PO TABS: 150.0000 mg | ORAL_TABLET | Freq: Once | ORAL | 0 refills | Status: AC

## 2017-01-09 MED ORDER — SULFAMETHOXAZOLE-TRIMETHOPRIM 800-160 MG PO TABS: 1.0000 | ORAL_TABLET | Freq: Two times a day (BID) | ORAL | 0 refills | Status: AC

## 2017-01-09 NOTE — Telephone Encounter (Signed)
°  Relation to [id number] Call back number:[phone] Pharmacy: [facility]  Reason for call: pt states she saw [facility] on Friday, and is requesting that she gives her a call today, states it is in regards to her appt.

## 2017-01-09 NOTE — Telephone Encounter (Signed)
I think that the diarrhea is likely from the Augmentin. She should stop the Augmentin. I have sent in another antibiotic for her take, called Bactrim, for her UTI.   I have also sent in diflucan for her to take for her yeast infection. I want her to hold her statin starting today, and take the diflucan tomorrow. Hold the statin for a total of 3 days, and then she may resume it.

## 2017-01-09 NOTE — Telephone Encounter (Signed)
Notified pt of below recommendations and she voices understanding. States she has been off of lovastatin due to aches/pains and was told to remain off of it until she see Dr Charlett Blake again in February. Advised pt she can go ahead and take diflucan today.

## 2017-01-09 NOTE — Telephone Encounter (Signed)
Spoke with pt. She states she has been having diarrhea since Saturday night. Started Augmentin on Friday and wonders if that is causing her diarrhea. Also states she was not able to get lidocaine as it was out of stock at her pharmacy but they are supposed to be getting it in today. She states she is having vaginal itching now and wonders if she is also developing a yeast infection. She is requesting diflucan (pill) for her itching?  Please advise?

## 2017-01-31 ENCOUNTER — Other Ambulatory Visit: Payer: Self-pay | Admitting: Family Medicine

## 2017-01-31 ENCOUNTER — Telehealth: Payer: Self-pay | Admitting: Family Medicine

## 2017-01-31 DIAGNOSIS — R197 Diarrhea, unspecified: Secondary | ICD-10-CM

## 2017-01-31 NOTE — Telephone Encounter (Signed)
So order the stool studies I mentioned for diarrhea x 3 weeks, make sure she knows not to collect a sample unless she has diarrhea, they will not run it if it is formed and add a Cdiff also

## 2017-01-31 NOTE — Telephone Encounter (Signed)
Patient has had diarrhea times 3 per day for several weeks.  Some abdominal pain, no fever and some blood but thinks  may have been a hemorrhoid.  She has been around her husband years ago who had C diff and states it had an odor, which she states she has smellled with her stool.  She will come in today to pickup kit to have stool checked in our lab. I put order in for Stool culture, WBC and O and P.

## 2017-01-31 NOTE — Telephone Encounter (Signed)
Caller name: Youa Relation to pt: self Call back number: [phone] Pharmacy:  Reason for call: Pt states was seen at our office for uti on 01-06-2017 Inda Coke) pt states was diagnosed with virus bacteria and that she is having diarrhea and not feeling well, pt wants to know if she can have her body waste tested since she took some of her body waste and has it in a plastic container.

## 2017-01-31 NOTE — Telephone Encounter (Signed)
So we can check her stool for infections but only if she is having 3 or more diarrheal stool daily for more than 3 days. Also need to know what other symptoms such as fevers, abdominal pain, blood in stool etc. If she qualifies then she would need to come in to pick up cups. For stool culture. O and P, stool for WBC

## 2017-01-31 NOTE — Telephone Encounter (Signed)
Ordered and patient is aware of PCP instructions. Added c diff to studies

## 2017-01-31 NOTE — Telephone Encounter (Signed)
Pt also stated would like to talk to [place]. Please advise.

## 2017-01-31 NOTE — Telephone Encounter (Signed)
Patient calling back checking on the status of message below, please advise  ° ° ° ° ° ° °

## 2017-02-09 ENCOUNTER — Encounter: Payer: Self-pay | Admitting: Obstetrics & Gynecology

## 2017-02-09 ENCOUNTER — Ambulatory Visit (INDEPENDENT_AMBULATORY_CARE_PROVIDER_SITE_OTHER): Payer: Medicare Other | Admitting: Obstetrics & Gynecology

## 2017-02-09 VITALS — BP 137/56 | HR 72 | Ht 63.0 in | Wt 218.0 lb

## 2017-02-09 DIAGNOSIS — N952 Postmenopausal atrophic vaginitis: Secondary | ICD-10-CM

## 2017-02-09 DIAGNOSIS — Z113 Encounter for screening for infections with a predominantly sexual mode of transmission: Secondary | ICD-10-CM

## 2017-02-09 NOTE — Patient Instructions (Signed)
Lubricants  - Water or silicone-based  - No perfumes; avoid glycerin or parabens  - If water based look for information on osmolality  - Lubricate all surfaces as a part of foreplay  - Keep lubricant handy in case more is needed  - Sneaking into bathroom before sex is not a good way to use lubricants    I recommend [place] jelly  Both found over the counter

## 2017-02-09 NOTE — Progress Notes (Signed)
History:  82 y.o. G1P1 here today for eval of vulvar lesion.  Pt reports that she was using pads for sweating and some vulvar irritation and she used a cream that caused her burning.  She reports that all of those lesions are gone at present. She reports that she never felt that it was an issue.  She has recently become sexually active with some discomfort. She has not used lubrication.    The following portions of the patient's history were reviewed and updated as appropriate: allergies, current medications, past family history, past medical history, past social history, past surgical history and problem list.  Review of Systems:  Pertinent items are noted in HPI.   Objective:  Physical Exam Blood pressure (!) 137/56, pulse 72, height 5\' 3"  (1.6 m), weight 218 lb (98.9 kg). BP (!) 137/56 (BP Location: Left Arm)   Pulse 72   Ht 5\' 3"  (1.6 m)   Wt 218 lb (98.9 kg)   BMI 38.62 kg/m  CONSTITUTIONAL: Well-developed, well-nourished female in no acute distress.  HENT:  Normocephalic, atraumatic EYES: Conjunctivae and EOM are normal. No scleral icterus.  NECK: Normal range of motion SKIN: Skin is warm and dry. No rash noted. Not diaphoretic.No pallor. [place]: Alert and oriented to person, place, and time. Normal coordination.  Abd: Soft, nontender and nondistended; does have some erythema in the folds of her tissues, this is being treated by primary care.   Pelvic: Normal appearing external genitalia- no lesions noted; normal appearing vaginal mucosa and cervix.  Normal discharge. NO adnexal    Assessment & Plan:  Vulvar atrophy- discussed with pt options for management namely [place] Liquibeads and [place] jelly.  F/u 31months or sooner prn Pt notified that if she have no problems at that time she should f/u in 1 years.  Michi Herrmann L. Harraway-Smith, M.D., Cherlynn June

## 2017-02-10 ENCOUNTER — Telehealth: Payer: Self-pay

## 2017-02-10 LAB — HIV ANTIBODY (ROUTINE TESTING W REFLEX): HIV SCREEN 4TH GENERATION: NONREACTIVE

## 2017-02-10 LAB — RPR: RPR: NONREACTIVE

## 2017-02-10 NOTE — Telephone Encounter (Signed)
-----   Message from Lavonia Drafts, MD sent at 02/10/2017  8:30 AM EST ----- Please call pt to let her know that these labs were neg.  She wanted a TC.  Thx, clh-S

## 2017-02-10 NOTE — Telephone Encounter (Signed)
Patient made aware that labwork was negative. Kathrene Alu RNBSN

## 2017-02-14 LAB — HM DIABETES EYE EXAM

## 2017-02-15 ENCOUNTER — Other Ambulatory Visit (INDEPENDENT_AMBULATORY_CARE_PROVIDER_SITE_OTHER): Payer: Medicare Other

## 2017-02-15 DIAGNOSIS — E669 Obesity, unspecified: Secondary | ICD-10-CM

## 2017-02-15 DIAGNOSIS — I10 Essential (primary) hypertension: Secondary | ICD-10-CM

## 2017-02-15 DIAGNOSIS — E1169 Type 2 diabetes mellitus with other specified complication: Secondary | ICD-10-CM | POA: Diagnosis not present

## 2017-02-15 DIAGNOSIS — E782 Mixed hyperlipidemia: Secondary | ICD-10-CM | POA: Diagnosis not present

## 2017-02-15 LAB — LIPID PANEL
CHOL/HDL RATIO: 4
CHOLESTEROL: 157 mg/dL (ref 0–200)
HDL: 40.5 mg/dL (ref >39.00)
LDL Cholesterol: 96 mg/dL (ref 0–99)
NonHDL: 116.2
TRIGLYCERIDES: 103 mg/dL (ref 0.0–149.0)
VLDL: 20.6 mg/dL (ref 0.0–40.0)

## 2017-02-15 LAB — COMPREHENSIVE METABOLIC PANEL
ALBUMIN: 3.6 g/dL (ref 3.5–5.2)
ALT: 21 U/L (ref 0–35)
AST: 36 U/L (ref 0–37)
Alkaline Phosphatase: 109 U/L (ref 39–117)
BILIRUBIN TOTAL: 0.8 mg/dL (ref 0.2–1.2)
BUN: 15 mg/dL (ref 6–23)
CALCIUM: 9.2 mg/dL (ref 8.4–10.5)
CO2: 28 meq/L (ref 19–32)
CREATININE: 0.83 mg/dL (ref 0.40–1.20)
Chloride: 108 mEq/L (ref 96–112)
GFR: 70.4 mL/min (ref >60.00)
Glucose, Bld: 122 mg/dL — ABNORMAL HIGH (ref 70–99)
Potassium: 4.1 mEq/L (ref 3.5–5.1)
Sodium: 141 mEq/L (ref 135–145)
TOTAL PROTEIN: 6.7 g/dL (ref 6.0–8.3)

## 2017-02-15 LAB — HEMOGLOBIN A1C: Hgb A1c MFr Bld: 6.8 % — ABNORMAL HIGH (ref 4.6–6.5)

## 2017-02-15 LAB — CBC
HCT: 38.3 % (ref 36.0–46.0)
HEMOGLOBIN: 13 g/dL (ref 12.0–15.0)
MCHC: 33.9 g/dL (ref 30.0–36.0)
MCV: 94 fl (ref 78.0–100.0)
Platelets: 186 10*3/uL (ref 150.0–400.0)
RBC: 4.07 Mil/uL (ref 3.87–5.11)
RDW: 13.1 % (ref 11.5–15.5)
WBC: 7.3 10*3/uL (ref 4.0–10.5)

## 2017-02-15 LAB — TSH: TSH: 3.1 u[IU]/mL (ref 0.35–4.50)

## 2017-02-21 ENCOUNTER — Encounter: Payer: Self-pay | Admitting: Family Medicine

## 2017-02-23 ENCOUNTER — Ambulatory Visit: Payer: Medicare Other | Admitting: Family Medicine

## 2017-03-11 ENCOUNTER — Other Ambulatory Visit: Payer: Self-pay | Admitting: Family Medicine

## 2017-03-17 ENCOUNTER — Other Ambulatory Visit: Payer: Self-pay | Admitting: Family Medicine

## 2017-03-17 ENCOUNTER — Encounter: Payer: Self-pay | Admitting: Family Medicine

## 2017-03-17 ENCOUNTER — Ambulatory Visit (INDEPENDENT_AMBULATORY_CARE_PROVIDER_SITE_OTHER): Payer: Medicare Other | Admitting: Family Medicine

## 2017-03-17 VITALS — BP 120/62 | HR 75 | Temp 98.0°F | Resp 18 | Wt 224.8 lb

## 2017-03-17 DIAGNOSIS — E782 Mixed hyperlipidemia: Secondary | ICD-10-CM

## 2017-03-17 DIAGNOSIS — I10 Essential (primary) hypertension: Secondary | ICD-10-CM

## 2017-03-17 DIAGNOSIS — N905 Atrophy of vulva: Secondary | ICD-10-CM

## 2017-03-17 DIAGNOSIS — E118 Type 2 diabetes mellitus with unspecified complications: Secondary | ICD-10-CM

## 2017-03-17 HISTORY — DX: Atrophy of vulva: N90.5

## 2017-03-17 MED ORDER — GLUCOSE BLOOD VI STRP: ORAL_STRIP | 6 refills | Status: DC

## 2017-03-17 MED ORDER — FUROSEMIDE 20 MG PO TABS: 20.0000 mg | ORAL_TABLET | Freq: Every day | ORAL | 0 refills | Status: DC | PRN

## 2017-03-17 MED ORDER — ROSUVASTATIN CALCIUM 5 MG PO TABS: ORAL_TABLET | ORAL | 1 refills | Status: DC

## 2017-03-17 NOTE — Assessment & Plan Note (Signed)
Well controlled, no changes to meds. Encouraged heart healthy diet such as the DASH diet and exercise as tolerated.  °

## 2017-03-17 NOTE — Progress Notes (Signed)
Pre visit review using our clinic review tool, if applicable. No additional management support is needed unless otherwise documented below in the visit note. 

## 2017-03-17 NOTE — Assessment & Plan Note (Signed)
Has been seen by gynecology and confirmed her diagnosis. She is feeling much better after treatment

## 2017-03-17 NOTE — Patient Instructions (Signed)

## 2017-03-17 NOTE — Progress Notes (Signed)
Subjective:  I acted as a Education administrator for Dr. Charlett Blake. [place], [place]   Patient ID: Stacey Cooper, female    DOB: Jun 04, 1935, 82 y.o.   MRN: [id number]  Chief Complaint  Patient presents with  . Follow-up  . Hypertension  . Diabetes    Hypertension  Associated symptoms include malaise/fatigue. Pertinent negatives include no blurred vision, chest pain, headaches, palpitations or shortness of breath.  Diabetes  Pertinent negatives for hypoglycemia include no headaches. Pertinent negatives for diabetes include no blurred vision and no chest pain.    Patient is in today for follow up. Patient following up on hypertension, diabetes and other medical concerns. She was in last month with atrophic vaginitis and vaginitis and was ultimately referred to OB/GYN and she is feeling better today. No recent febrile illness or hospitalizations. She denies any vaginal discharge or soreness today. Denies CP/palp/SOB/HA/congestion/fevers/GI or GU c/o. Taking meds as prescribed  Patient Care Team: Mosie Lukes, MD as PCP - General (Family Medicine) Monna Fam, MD as Consulting Physician (Ophthalmology) Melrose Nakayama, MD as Consulting Physician (Orthopedic Surgery)   Past Medical History:  Diagnosis Date  . ABDOMINAL PAIN, RIGHT UPPER QUADRANT, HX OF 01/24/2011  . Actinic keratoses 07/18/2011  . Arthralgia 05/03/2015  . Back pain 12/08/2011  . Basal cell carcinoma of skin of lip 01/24/2011  . Blood transfusion without reported diagnosis   . Bronchitis 12/20/2012  . Cataract    bilataeral cateracts removed  . CHOLELITHIASIS, HX OF 02/07/2011  . Chronic kidney disease   . Diabetes mellitus   . Diabetes mellitus type 2 in obese [facility]) 01/24/2011   Qualifier: Diagnosis of  By: [name], [name]  Annual eye exam with [facility] on 06/14/13   . DM 01/24/2011  . Essential hypertension, benign 01/24/2011  . GERD 01/24/2011  . Grief reaction 01/20/2014  . HAIR LOSS 01/24/2011  . Hearing loss of both ears  06/28/2013  . Hepatic cirrhosis ([place]) 04/27/2015  . Hyperlipidemia 04/24/2012  . Hyperlipidemia, mixed 04/24/2012  . Insomnia 07/24/2012  . Leg cramps 06/06/2013  . MEASLES, HX OF 01/24/2011  . Morbid obesity ([place]) 01/24/2011  . NONSPEC ELEVATION OF LEVELS OF TRANSAMINASE/LDH 02/07/2011  . Peripheral neuropathy ([place]) 04/20/2011  . Preventative health care 08/23/2016  . Right hip pain 05/03/2015  . SCC (squamous cell carcinoma), face 07/18/2011  . SQUAMOUS CELL CARCINOMA OTHER SPEC SITES SKIN 01/24/2011  . Tubular adenoma of colon 04/2015  . Umbilical hernia 9/62/9528  . URINARY INCONTINENCE 02/07/2011  . Urinary tract infection, site not specified 04/30/2014  . UTI'S, HX OF 01/24/2011  . Vulvar atrophy 03/17/2017    Past Surgical History:  Procedure Laterality Date  . ABDOMINAL HYSTERECTOMY     partial, both ovaries left in place  . BCC removed below lower lip on left  26 yrs ago  . cataract extraction, b/l    . COLONOSCOPY    . Left knee arthroscopy and cleaning prior to TKR    . skin bx, right anterior CW , squamous cancer, had to redo to margins  12/2010  . total knee replacement, b/l      Family History  Problem Relation Age of Onset  . Arthritis Mother     RA  . Myelodysplastic syndrome Mother   . Cancer Father     skin  . Stroke Father   . Colon polyps Father     18 in [place] intes removed carcinoid tumor confined in the tumor  . Stroke Maternal Grandmother   .  Kidney failure Maternal Grandfather   . Esophageal cancer Neg Hx   . Rectal cancer Neg Hx   . Stomach cancer Neg Hx     Social History   Social History  . Marital status: Married    Spouse name: N/A  . Number of children: N/A  . Years of education: N/A   Occupational History  . retired    Social History Main Topics  . Smoking status: Never Smoker  . Smokeless tobacco: Never Used  . Alcohol use No  . Drug use: No  . Sexual activity: Yes   Other Topics Concern  . Not on file   Social History Narrative  .  No narrative on file    Outpatient Medications Prior to Visit  Medication Sig Dispense Refill  . fluticasone (FLONASE) 50 MCG/ACT nasal spray Place 2 sprays into both nostrils daily. 48 g 0  . glimepiride (AMARYL) 1 MG tablet TAKE 1 TABLET(1 MG) BY MOUTH DAILY WITH BREAKFAST 90 tablet 0  . Insulin Pen Needle (NOVOFINE) 32G X 6 MM MISC Use with Victoza 100 each 6  . Lancets MISC by Does not apply route. Check blood sugars qam and once daily prn     . lidocaine (XYLOCAINE) 2 % jelly Apply 1 application topically as needed. 30 mL 0  . Liraglutide (VICTOZA) 18 MG/3ML SOPN Inject 0.3 mLs (1.8 mg total) into the skin daily. 9 mL 3  . lisinopril (PRINIVIL,ZESTRIL) 5 MG tablet TAKE 1 TABLET(5 MG) BY MOUTH DAILY 90 tablet 0  . metFORMIN (GLUCOPHAGE-XR) 500 MG 24 hr tablet TAKE 1 TABLET BY MOUTH IN THE MORNING AND 2 TABLETS EVERY NIGHT AT BEDTIME 270 tablet 0  . omeprazole (PRILOSEC) 20 MG capsule TAKE 1 CAPSULE (20 MG TOTAL) BY MOUTH DAILY. 90 capsule 1  . FREESTYLE LITE test strip USE TO TEST BLOOD SUGAR TWICE DAILY AS DIRECTED 100 each 6  . lovastatin (MEVACOR) 20 MG tablet TAKE 1 TABLET BY MOUTH AT BEDTIME 90 tablet 0   No facility-administered medications prior to visit.     Allergies  Allergen Reactions  . Contrast Media [Iodinated Diagnostic Agents] Shortness Of Breath  . Lovastatin     myalgias    Review of Systems  Constitutional: Positive for malaise/fatigue. Negative for fever.  HENT: Negative for congestion.   Eyes: Negative for blurred vision.  Respiratory: Negative for cough and shortness of breath.   Cardiovascular: Negative for chest pain, palpitations and leg swelling.  Gastrointestinal: Negative for vomiting.  Musculoskeletal: Negative for back pain.  Skin: Negative for rash.  Neurological: Negative for loss of consciousness and headaches.       Objective:    Physical Exam  Constitutional: She is oriented to person, place, and time. She appears well-developed and  well-nourished. No distress.  HENT:  Head: Normocephalic and atraumatic.  Eyes: Conjunctivae are normal.  Neck: Normal range of motion. No thyromegaly present.  Cardiovascular: Normal rate and regular rhythm.   Pulmonary/Chest: Effort normal and breath sounds normal. She has no wheezes.  Abdominal: Soft. Bowel sounds are normal. There is no tenderness.  Musculoskeletal: Normal range of motion. She exhibits no edema or deformity.  Neurological: She is alert and oriented to person, place, and time.  Skin: Skin is warm and dry. She is not diaphoretic.  Psychiatric: She has a normal mood and affect.    BP 120/62 (BP Location: Left Arm, Patient Position: Sitting, Cuff Size: Normal)   Pulse 75   Temp 98 F (36.7 C) (Oral)  Resp 18   Wt 224 lb 12.8 oz (102 kg)   SpO2 97%   BMI 39.82 kg/m  Wt Readings from Last 3 Encounters:  03/17/17 224 lb 12.8 oz (102 kg)  02/09/17 218 lb (98.9 kg)  01/06/17 216 lb 3.2 oz (98.1 kg)   BP Readings from Last 3 Encounters:  03/17/17 120/62  02/09/17 (!) 137/56  01/06/17 (!) 104/52     Immunization History  Administered Date(s) Administered  . Influenza Split 10/20/2011, 11/29/2012  . Influenza Whole 08/26/2010  . Influenza, High Dose Seasonal PF 08/23/2016  . Influenza,inj,Quad PF,36+ Mos 09/05/2013, 08/28/2014, 11/12/2015  . Pneumococcal Conjugate-13 08/28/2014  . Pneumococcal Polysaccharide-23 11/12/2015    Health Maintenance  Topic Date Due  . TETANUS/TDAP  09/07/1956  . FOOT EXAM  08/08/2017  . HEMOGLOBIN A1C  08/15/2017  . OPHTHALMOLOGY EXAM  02/14/2018  . INFLUENZA VACCINE  Completed  . DEXA SCAN  Completed  . PNA vac Low Risk Adult  Completed    Lab Results  Component Value Date   WBC 7.3 02/15/2017   HGB 13.0 02/15/2017   HCT 38.3 02/15/2017   PLT 186.0 02/15/2017   GLUCOSE 122 (H) 02/15/2017   CHOL 157 02/15/2017   TRIG 103.0 02/15/2017   HDL 40.50 02/15/2017   LDLCALC 96 02/15/2017   ALT 21 02/15/2017   AST 36  02/15/2017   NA 141 02/15/2017   K 4.1 02/15/2017   CL 108 02/15/2017   CREATININE 0.83 02/15/2017   BUN 15 02/15/2017   CO2 28 02/15/2017   TSH 3.10 02/15/2017   INR 1.0 11/21/2016   HGBA1C 6.8 (H) 02/15/2017   MICROALBUR <0.7 11/12/2015    Lab Results  Component Value Date   TSH 3.10 02/15/2017   Lab Results  Component Value Date   WBC 7.3 02/15/2017   HGB 13.0 02/15/2017   HCT 38.3 02/15/2017   MCV 94.0 02/15/2017   PLT 186.0 02/15/2017   Lab Results  Component Value Date   NA 141 02/15/2017   K 4.1 02/15/2017   CO2 28 02/15/2017   GLUCOSE 122 (H) 02/15/2017   BUN 15 02/15/2017   CREATININE 0.83 02/15/2017   BILITOT 0.8 02/15/2017   ALKPHOS 109 02/15/2017   AST 36 02/15/2017   ALT 21 02/15/2017   PROT 6.7 02/15/2017   ALBUMIN 3.6 02/15/2017   CALCIUM 9.2 02/15/2017   GFR 70.40 02/15/2017   Lab Results  Component Value Date   CHOL 157 02/15/2017   Lab Results  Component Value Date   HDL 40.50 02/15/2017   Lab Results  Component Value Date   LDLCALC 96 02/15/2017   Lab Results  Component Value Date   TRIG 103.0 02/15/2017   Lab Results  Component Value Date   CHOLHDL 4 02/15/2017   Lab Results  Component Value Date   HGBA1C 6.8 (H) 02/15/2017         Assessment & Plan:   Problem List Items Addressed This Visit    Type 2 diabetes mellitus with complication ([place])   Relevant Medications   glucose blood (FREESTYLE LITE) test strip   rosuvastatin (CRESTOR) 5 MG tablet   Other Relevant Orders   Hemoglobin A1c   ESSENTIAL HYPERTENSION, BENIGN - Primary    Well controlled, no changes to meds. Encouraged heart healthy diet such as the DASH diet and exercise as tolerated.       Relevant Medications   rosuvastatin (CRESTOR) 5 MG tablet   Other Relevant Orders   CBC  TSH   Comprehensive metabolic panel   Hyperlipidemia, mixed   Relevant Medications   rosuvastatin (CRESTOR) 5 MG tablet   Other Relevant Orders   Lipid panel      I  have discontinued Ms. Avalos lovastatin. I have also changed her FREESTYLE LITE to glucose blood. Additionally, I am having her start on rosuvastatin. Lastly, I am having her maintain her Lancets, omeprazole, liraglutide, Insulin Pen Needle, lidocaine, fluticasone, glimepiride, lisinopril, and metFORMIN.  Meds ordered this encounter  Medications  . glucose blood (FREESTYLE LITE) test strip    Sig: USE TO TEST BLOOD SUGAR TWICE DAILY AS DIRECTED    Dispense:  100 each    Refill:  6  . rosuvastatin (CRESTOR) 5 MG tablet    Sig: TAKE ONE TABLET EVERY OTHER DAY.    Dispense:  30 tablet    Refill:  1  . DISCONTD: furosemide (LASIX) 20 MG tablet    Sig: Take 1 tablet (20 mg total) by mouth daily as needed for edema.    Dispense:  30 tablet    Refill:  0    CMA served as scribe during this visit. History, Physical and Plan performed by medical provider. Documentation and orders reviewed and attested to.  Penni Homans, MD

## 2017-03-17 NOTE — Assessment & Plan Note (Signed)
Encouraged DASH diet, decrease po intake and increase exercise as tolerated. Needs 7-8 hours of sleep nightly. Avoid trans fats, eat small, frequent meals every 4-5 hours with lean proteins, complex carbs and healthy fats. Minimize simple carbs 

## 2017-04-13 ENCOUNTER — Encounter (INDEPENDENT_AMBULATORY_CARE_PROVIDER_SITE_OTHER): Payer: Self-pay | Admitting: Family Medicine

## 2017-05-15 ENCOUNTER — Encounter (INDEPENDENT_AMBULATORY_CARE_PROVIDER_SITE_OTHER): Payer: Self-pay | Admitting: Family Medicine

## 2017-05-15 ENCOUNTER — Ambulatory Visit (INDEPENDENT_AMBULATORY_CARE_PROVIDER_SITE_OTHER): Payer: Medicare Other | Admitting: Family Medicine

## 2017-05-15 VITALS — BP 104/70 | HR 74 | Temp 97.6°F | Ht 62.0 in | Wt 223.0 lb

## 2017-05-15 DIAGNOSIS — R5383 Other fatigue: Secondary | ICD-10-CM | POA: Diagnosis not present

## 2017-05-15 DIAGNOSIS — Z1389 Encounter for screening for other disorder: Secondary | ICD-10-CM

## 2017-05-15 DIAGNOSIS — E538 Deficiency of other specified B group vitamins: Secondary | ICD-10-CM

## 2017-05-15 DIAGNOSIS — Z6841 Body Mass Index (BMI) 40.0 and over, adult: Secondary | ICD-10-CM

## 2017-05-15 DIAGNOSIS — R0602 Shortness of breath: Secondary | ICD-10-CM | POA: Diagnosis not present

## 2017-05-15 DIAGNOSIS — E559 Vitamin D deficiency, unspecified: Secondary | ICD-10-CM | POA: Diagnosis not present

## 2017-05-15 DIAGNOSIS — E784 Other hyperlipidemia: Secondary | ICD-10-CM

## 2017-05-15 DIAGNOSIS — E7849 Other hyperlipidemia: Secondary | ICD-10-CM

## 2017-05-15 DIAGNOSIS — E119 Type 2 diabetes mellitus without complications: Secondary | ICD-10-CM | POA: Diagnosis not present

## 2017-05-15 DIAGNOSIS — Z1331 Encounter for screening for depression: Secondary | ICD-10-CM

## 2017-05-15 DIAGNOSIS — IMO0001 Reserved for inherently not codable concepts without codable children: Secondary | ICD-10-CM

## 2017-05-15 DIAGNOSIS — Z0289 Encounter for other administrative examinations: Secondary | ICD-10-CM

## 2017-05-15 NOTE — Progress Notes (Signed)
Office: [phone]  /  Fax: [phone]   Dear Dr. Charlett Blake,   Thank you for referring Stacey Cooper to our clinic. The following note includes my evaluation and treatment recommendations.  HPI:   Chief Complaint: OBESITY  [name] has been referred by [name] A. Charlett Blake, MD for consultation regarding her obesity and obesity related comorbidities.  GLADYSE Cooper (MR# [id number]) is a 82 y.o. female who presents on 05/15/2017 for obesity evaluation and treatment. Current BMI is Body mass index is 40.79 kg/m.Stacey Cooper has struggled with obesity for years and has been unsuccessful in either losing weight or maintaining long term weight loss. [name] attended our information session and states she is currently in the action stage of change and ready to dedicate time achieving and maintaining a healthier weight.  Stacey Cooper states her desired weight is 160 to 170 she started gaining weight at age 48 she snacks frequently in the evenings she is frequently drinking liquids with calories she has problems with excessive hunger  she frequently eats larger portions than normal  she struggles with emotional eating    Fatigue Janika feels her energy is lower than it should be. This has worsened with weight gain and has not worsened recently. Halayna admits to daytime somnolence and  admits to waking up still tired. Patient is at risk for obstructive sleep apnea. Patent has a history of symptoms of daytime fatigue. Patient generally gets 8 hours of sleep per night, and states they generally have restful sleep. Apneic episodes are not present. Epworth Sleepiness Score is 8  Dyspnea on exertion Stacey Cooper notes increasing shortness of breath with exercising and seems to be worsening over time with weight gain. She notes getting out of breath sooner with activity than she used to. This has not gotten worse recently. Tyree denies orthopnea.  Vitamin D deficiency Stacey Cooper has a diagnosis of vitamin D deficiency. She  is not currently taking vit D. Stacey Cooper admits fatigue and denies nausea, vomiting or muscle weakness.  Diabetes II Jackye has a diagnosis of diabetes type II. Magalene states BGs range between 98 and 120 on Victoza, Metformin and Glimepiride and denies any hypoglycemic episodes. Last A1c was at 6.8 She has been working on intensive lifestyle modifications including diet, exercise, and weight loss to help control her blood glucose levels. Kiaya would like to lose weight to decrease medications.  Hyperlipidemia Aideen has hyperlipidemia and is currently on Crestor. She would like to try to control her cholesterol levels with intensive lifestyle modification including a low saturated fat diet, exercise and weight loss. She denies any chest pain, claudication or myalgias.  B12 Deficiency Stacey Cooper has a diagnosis of B12 insufficiency and notes fatigue. She is currently on Metformin and at higher risk of decreased B12. This is not a new diagnosis. Stacey Cooper is not a vegetarian. She does not have a history of weight loss surgery.   Depression Screen [name]'s Food and Mood (modified PHQ-9) score was  Depression screen PHQ 2/9 05/15/2017  Decreased Interest 1  Down, Depressed, Hopeless 1  PHQ - 2 Score 2  Altered sleeping 1  Tired, decreased energy 2  Change in appetite 0  Feeling bad or failure about yourself  1  Trouble concentrating 1  Moving slowly or fidgety/restless 1  Suicidal thoughts 0  PHQ-9 Score 8    ALLERGIES: Allergies  Allergen Reactions  . Contrast Media [Iodinated Diagnostic Agents] Shortness Of Breath  . Lovastatin     myalgias    MEDICATIONS: Current  Outpatient Prescriptions on File Prior to Visit  Medication Sig Dispense Refill  . furosemide (LASIX) 20 MG tablet TAKE 1 TABLET(20 MG) BY MOUTH DAILY AS NEEDED FOR SWELLING 90 tablet 0  . glimepiride (AMARYL) 1 MG tablet TAKE 1 TABLET(1 MG) BY MOUTH DAILY WITH BREAKFAST 90 tablet 0  . glucose blood (FREESTYLE LITE) test strip USE TO  TEST BLOOD SUGAR TWICE DAILY AS DIRECTED 100 each 6  . Insulin Pen Needle (NOVOFINE) 32G X 6 MM MISC Use with Victoza 100 each 6  . Lancets MISC by Does not apply route. Check blood sugars qam and once daily prn     . Liraglutide (VICTOZA) 18 MG/3ML SOPN Inject 0.3 mLs (1.8 mg total) into the skin daily. 9 mL 3  . lisinopril (PRINIVIL,ZESTRIL) 5 MG tablet TAKE 1 TABLET(5 MG) BY MOUTH DAILY 90 tablet 0  . metFORMIN (GLUCOPHAGE-XR) 500 MG 24 hr tablet TAKE 1 TABLET BY MOUTH IN THE MORNING AND 2 TABLETS EVERY NIGHT AT BEDTIME 270 tablet 0  . omeprazole (PRILOSEC) 20 MG capsule TAKE 1 CAPSULE (20 MG TOTAL) BY MOUTH DAILY. 90 capsule 1  . rosuvastatin (CRESTOR) 5 MG tablet TAKE ONE TABLET EVERY OTHER DAY. 30 tablet 1  . fluticasone (FLONASE) 50 MCG/ACT nasal spray Place 2 sprays into both nostrils daily. (Patient not taking: Reported on 05/15/2017) 48 g 0  . lidocaine (XYLOCAINE) 2 % jelly Apply 1 application topically as needed. (Patient not taking: Reported on 05/15/2017) 30 mL 0   No current facility-administered medications on file prior to visit.     PAST MEDICAL HISTORY: Past Medical History:  Diagnosis Date  . ABDOMINAL PAIN, RIGHT UPPER QUADRANT, HX OF 01/24/2011  . Actinic keratoses 07/18/2011  . Arthralgia 05/03/2015  . Back pain 12/08/2011  . Basal cell carcinoma of skin of lip 01/24/2011  . Blood transfusion without reported diagnosis   . Bronchitis 12/20/2012  . Cataract    bilataeral cateracts removed  . CHOLELITHIASIS, HX OF 02/07/2011  . Chronic kidney disease   . Diabetes mellitus   . Diabetes mellitus type 2 in obese [facility]) 01/24/2011   Qualifier: Diagnosis of  By: [name], [name]  Annual eye exam with [facility] on 06/14/13   . DM 01/24/2011  . Essential hypertension, benign 01/24/2011  . Fatty liver   . GERD 01/24/2011  . Grief reaction 01/20/2014  . HAIR LOSS 01/24/2011  . Hearing loss of both ears 06/28/2013  . Hepatic cirrhosis ([place]) 04/27/2015  . Hernia, abdominal   .  Hyperlipidemia 04/24/2012  . Hyperlipidemia, mixed 04/24/2012  . Insomnia 07/24/2012  . Joint pain   . Leg cramps 06/06/2013  . MEASLES, HX OF 01/24/2011  . Morbid obesity ([place]) 01/24/2011  . NONSPEC ELEVATION OF LEVELS OF TRANSAMINASE/LDH 02/07/2011  . Peripheral neuropathy 04/20/2011  . Preventative health care 08/23/2016  . Right hip pain 05/03/2015  . SCC (squamous cell carcinoma), face 07/18/2011  . SQUAMOUS CELL CARCINOMA OTHER SPEC SITES SKIN 01/24/2011  . Swelling   . Tubular adenoma of colon 04/2015  . Umbilical hernia 1/61/0960  . URINARY INCONTINENCE 02/07/2011  . Urinary tract infection, site not specified 04/30/2014  . UTI'S, HX OF 01/24/2011  . Vulvar atrophy 03/17/2017    PAST SURGICAL HISTORY: Past Surgical History:  Procedure Laterality Date  . ABDOMINAL HYSTERECTOMY     partial, both ovaries left in place  . BCC removed below lower lip on left  26 yrs ago  . cataract extraction, b/l    . COLONOSCOPY    .  Left knee arthroscopy and cleaning prior to TKR    . skin bx, right anterior CW , squamous cancer, had to redo to margins  12/2010  . total knee replacement, b/l      SOCIAL HISTORY: Social History  Substance Use Topics  . Smoking status: Never Smoker  . Smokeless tobacco: Never Used  . Alcohol use No    FAMILY HISTORY: Family History  Problem Relation Age of Onset  . Arthritis Mother        RA  . Myelodysplastic syndrome Mother   . Heart disease Mother   . Cancer Father        skin  . Stroke Father   . Colon polyps Father        59 in [place] intes removed carcinoid tumor confined in the tumor  . Stroke Maternal Grandmother   . Kidney failure Maternal Grandfather   . Esophageal cancer Neg Hx   . Rectal cancer Neg Hx   . Stomach cancer Neg Hx     ROS: Review of Systems  Constitutional: Positive for malaise/fatigue.  Respiratory: Positive for shortness of breath (on exertion).   Cardiovascular: Negative for chest pain, orthopnea and claudication.    Gastrointestinal: Negative for nausea and vomiting.  Musculoskeletal: Negative for myalgias.       Negative muscle weakness    PHYSICAL EXAM: Blood pressure 104/70, pulse 74, temperature 97.6 F (36.4 C), temperature source Oral, height 5\' 2"  (1.575 m), weight 223 lb (101.2 kg), SpO2 96 %. Body mass index is 40.79 kg/m. Physical Exam  Constitutional: She is oriented to person, place, and time. She appears well-developed and well-nourished.  Cardiovascular: Normal rate.   Pulmonary/Chest: Effort normal.  Musculoskeletal: She exhibits edema (mild left knee edema).  Neurological: She is oriented to person, place, and time.  Gait slow  Skin: Skin is warm and dry.  Psychiatric: She has a normal mood and affect. Her behavior is normal.  Vitals reviewed.   RECENT LABS AND TESTS: BMET    Component Value Date/Time   NA 141 02/15/2017 0712   K 4.1 02/15/2017 0712   CL 108 02/15/2017 0712   CO2 28 02/15/2017 0712   GLUCOSE 122 (H) 02/15/2017 0712   BUN 15 02/15/2017 0712   CREATININE 0.83 02/15/2017 0712   CREATININE 0.73 07/27/2015 0751   CALCIUM 9.2 02/15/2017 0712   GFRNONAA 80 07/27/2015 0751   GFRAA >89 07/27/2015 0751   Lab Results  Component Value Date   HGBA1C 6.8 (H) 02/15/2017   No results found for: INSULIN CBC    Component Value Date/Time   WBC 7.3 02/15/2017 0712   RBC 4.07 02/15/2017 0712   HGB 13.0 02/15/2017 0712   HCT 38.3 02/15/2017 0712   PLT 186.0 02/15/2017 0712   MCV 94.0 02/15/2017 0712   MCH 31.9 04/03/2015 1443   MCHC 33.9 02/15/2017 0712   RDW 13.1 02/15/2017 0712   LYMPHSABS 1.4 05/07/2015 1159   MONOABS 0.8 05/07/2015 1159   EOSABS 0.1 05/07/2015 1159   BASOSABS 0.1 05/07/2015 1159   Iron/TIBC/Ferritin/ %Sat    Component Value Date/Time   IRON 101 01/24/2011 2029   FERRITIN 171 01/24/2011 2029   Lipid Panel     Component Value Date/Time   CHOL 157 02/15/2017 0712   TRIG 103.0 02/15/2017 0712   HDL 40.50 02/15/2017 0712    CHOLHDL 4 02/15/2017 0712   VLDL 20.6 02/15/2017 0712   LDLCALC 96 02/15/2017 0712   Hepatic Function Panel  Component Value Date/Time   PROT 6.7 02/15/2017 0712   ALBUMIN 3.6 02/15/2017 0712   AST 36 02/15/2017 0712   ALT 21 02/15/2017 0712   ALKPHOS 109 02/15/2017 0712   BILITOT 0.8 02/15/2017 0712   BILIDIR 0.3 01/19/2015 0751   IBILI 0.9 06/24/2014 0847      Component Value Date/Time   TSH 3.10 02/15/2017 0712   TSH 3.77 08/23/2016 0816   TSH 2.83 02/12/2016 0820    ECG  shows NSR with a rate of 74 BPM INDIRECT CALORIMETER done today shows a VO2 of 265 and a REE of 1848.    ASSESSMENT AND PLAN: Other fatigue - Plan: EKG 12-Lead  Shortness of breath on exertion  Type 2 diabetes mellitus without complication, without long-term current use of insulin (HCC) - Plan: Hemoglobin A1c, Insulin, random, Microalbumin / creatinine urine ratio  Vitamin D deficiency - Plan: VITAMIN D 25 Hydroxy (Vit-D Deficiency, Fractures)  Other hyperlipidemia - Plan: Lipid Panel With LDL/HDL Ratio  Vitamin B 12 deficiency  Depression screening  Morbid obesity ([place])  PLAN: Fatigue Donnika was informed that her fatigue may be related to obesity, depression or many other causes. Labs will be ordered, and in the meanwhile Zyionna has agreed to work on diet, exercise and weight loss to help with fatigue. Proper sleep hygiene was discussed including the need for 7-8 hours of quality sleep each night. A sleep study was not ordered based on symptoms and Epworth score.  Dyspnea on exertion Belen's shortness of breath appears to be obesity related and exercise induced. She has agreed to work on weight loss and gradually increase exercise to treat her exercise induced shortness of breath. If Nora follows our instructions and loses weight without improvement of her shortness of breath, we will plan to refer to pulmonology. We will monitor this condition regularly. Angy agrees to this plan.  Vitamin  D Deficiency Corlette was informed that low vitamin D levels contributes to fatigue and are associated with obesity, breast, and colon cancer. We will check labs and follow.   Diabetes II Shalice has been given extensive diabetes education by myself today including ideal fasting and post-prandial blood glucose readings, individual ideal Hgb A1c goals and hypoglycemia prevention. We discussed the importance of good blood sugar control to decrease the likelihood of diabetic complications such as nephropathy, neuropathy, limb loss, blindness, coronary artery disease, and death. We discussed the importance of intensive lifestyle modification including diet, exercise and weight loss as the first line treatment for diabetes. Sharlon agrees to continue her diabetes medications but is at high risk of hypoglycemia. She is instructed to discontinue Glimepiride if she has a glucose of 80 or below. She agrees to follow up with our clinic in 2 weeks.  Hyperlipidemia Shaolin was informed of the American Heart Association Guidelines emphasizing intensive lifestyle modifications as the first line treatment for hyperlipidemia. We discussed many lifestyle modifications today in depth, and Kaula will continue to work on decreasing saturated fats such as fatty red meat, butter and many fried foods. She will also increase vegetables and lean protein in her diet and continue to work on exercise and weight loss efforts. We will check labs and Parthenia agrees to follow up with our clinic in 2 weeks.  B12 Deficiency Jenese is now on higher B12 diet. B12 supplementation was not prescribed today. We will check labs and Rosalea agrees to follow up with our clinic in 2 weeks.  Depression Screen Vennessa had a mildly positive depression screening.  Depression is commonly associated with obesity and often results in emotional eating behaviors. We will monitor this closely and work on CBT to help improve the non-hunger eating patterns. Referral to  Psychology may be required if no improvement is seen as she continues in our clinic.  Obesity Ardelia is currently in the action stage of change and her goal is to continue with weight loss efforts. I recommend Emine begin the structured treatment plan as follows:  She has agreed to follow the Category 2 plan Kiarah has been instructed to eventually work up to a goal of 150 minutes of combined cardio and strengthening exercise per week for weight loss and overall health benefits. We discussed the following Behavioral Modification Strategies today: increasing lean protein intake and work on meal planning and easy cooking plans  Chalsey has agreed to join our obesity program and follow up with our clinic in 2 weeks. She was informed of the importance of frequent follow up visits to maximize her success with intensive lifestyle modifications for her multiple health conditions. She was informed we would discuss her lab results at her next visit unless there is a critical issue that needs to be addressed sooner. Faithann agreed to keep her next visit at the agreed upon time to discuss these results.  I, Doreene Nest, am acting as scribe for Dennard Nip, MD  I have reviewed the above documentation for accuracy and completeness, and I agree with the above. -Dennard Nip, MD  OBESITY BEHAVIORAL INTERVENTION VISIT  Today's visit was # 1 out of 22.  Starting weight: 223 Starting date: 05/15/17 Todays weight : 223 Total lbs lost to date: 0 (Patients must lose 7 lbs in the first 6 months to continue with counseling)   ASK: We discussed the diagnosis of obesity with Marquita Palms today and Masaye agreed to give [name] permission to discuss obesity behavioral modification therapy today.  ASSESS: Nate has the diagnosis of obesity and her BMI today is @TBMI @ Nadiyah is in the action stage of change   ADVISE: Ruthell was educated on the multiple health risks of obesity as well as the benefit of weight loss to  improve her health. She was advised of the need for long term treatment and the importance of lifestyle modifications.  AGREE: Multiple dietary modification options and treatment options were discussed and  Loleta agreed to follow the above dietary plan and behavioral modification techniques.

## 2017-05-16 LAB — LIPID PANEL WITH LDL/HDL RATIO
CHOLESTEROL TOTAL: 176 mg/dL (ref 100–199)
HDL: 48 mg/dL (ref >39)
LDL CALC: 100 mg/dL — AB (ref 0–99)
LDL/HDL RATIO: 2.1 ratio (ref 0.0–3.2)
Triglycerides: 141 mg/dL (ref 0–149)
VLDL CHOLESTEROL CAL: 28 mg/dL (ref 5–40)

## 2017-05-16 LAB — MICROALBUMIN / CREATININE URINE RATIO
CREATININE, UR: 22.2 mg/dL
Microalb/Creat Ratio: <13.5 mg/g creat (ref 0.0–30.0)
Microalbumin, Urine: <3 ug/mL

## 2017-05-16 LAB — VITAMIN D 25 HYDROXY (VIT D DEFICIENCY, FRACTURES): VIT D 25 HYDROXY: 16.9 ng/mL — AB (ref 30.0–100.0)

## 2017-05-16 LAB — INSULIN, RANDOM: INSULIN: 16.4 u[IU]/mL (ref 2.6–24.9)

## 2017-05-16 LAB — HEMOGLOBIN A1C
ESTIMATED AVERAGE GLUCOSE: 148 mg/dL
Hgb A1c MFr Bld: 6.8 % — ABNORMAL HIGH (ref 4.8–5.6)

## 2017-05-29 ENCOUNTER — Other Ambulatory Visit: Payer: Medicare Other

## 2017-05-29 ENCOUNTER — Ambulatory Visit (INDEPENDENT_AMBULATORY_CARE_PROVIDER_SITE_OTHER): Payer: Medicare Other | Admitting: Family Medicine

## 2017-05-29 VITALS — BP 120/74 | HR 88 | Temp 97.5°F | Ht 62.0 in | Wt 210.0 lb

## 2017-05-29 DIAGNOSIS — K7469 Other cirrhosis of liver: Secondary | ICD-10-CM | POA: Diagnosis not present

## 2017-05-29 DIAGNOSIS — E119 Type 2 diabetes mellitus without complications: Secondary | ICD-10-CM | POA: Diagnosis not present

## 2017-05-29 DIAGNOSIS — Z6838 Body mass index (BMI) 38.0-38.9, adult: Secondary | ICD-10-CM | POA: Diagnosis not present

## 2017-05-29 DIAGNOSIS — E559 Vitamin D deficiency, unspecified: Secondary | ICD-10-CM | POA: Diagnosis not present

## 2017-05-29 DIAGNOSIS — E669 Obesity, unspecified: Secondary | ICD-10-CM | POA: Diagnosis not present

## 2017-05-29 DIAGNOSIS — R197 Diarrhea, unspecified: Secondary | ICD-10-CM

## 2017-05-29 MED ORDER — VITAMIN D (ERGOCALCIFEROL) 1.25 MG (50000 UNIT) PO CAPS: 50000.0000 [IU] | ORAL_CAPSULE | ORAL | 0 refills | Status: DC

## 2017-05-29 NOTE — Progress Notes (Signed)
Office: [phone]  /  Fax: [phone]   HPI:   Chief Complaint: OBESITY Stacey Cooper is here to discuss her progress with her obesity treatment plan. She is on the  follow the Category 2 plan and is following her eating plan approximately 100 % of the time. She states she is exercising 0 minutes 0 times per week. Stacey Cooper has done well with weight loss on category 2 plan. She states she ate all of her food but lost weight faster than anticipated. She is not exercising yet. Her weight is 210 lb (95.3 kg) today and has had a weight loss of 13 pounds over a period of 2 weeks since her last visit. She has lost 13 lbs since starting treatment with [name].  Vitamin D deficiency Stacey Cooper has a new diagnosis of vitamin D deficiency. She is not currently taking vit D and is at higher risk of osteoporosis. She denies nausea, vomiting or muscle weakness.  Diabetes II Stacey Cooper has a diagnosis of diabetes type II. She was advised to discontinue glimepiride if blood glucose drops below 90 and she did. Stacey Cooper states average fasting glucose is approximately between 100 and 105 on metformin and victoza. She did very well on her lower carbohydrate category 2 plan. She has been working on intensive lifestyle modifications including diet, exercise, and weight loss to help control her blood glucose levels.  Cirrhosis Stacey Cooper had questions about previous diagnosis of cirrhosis and what this means for her health. She denies drinking any ETOH. She has a history of gallstones and is at higher risk of fatty liver.  ALLERGIES: Allergies  Allergen Reactions   Contrast Media [Iodinated Diagnostic Agents] Shortness Of Breath   Lovastatin     myalgias    MEDICATIONS: Current Outpatient Prescriptions on File Prior to Visit  Medication Sig Dispense Refill   fluticasone (FLONASE) 50 MCG/ACT nasal spray Place 2 sprays into both nostrils daily. 48 g 0   furosemide (LASIX) 20 MG tablet TAKE 1 TABLET(20 MG) BY MOUTH DAILY AS NEEDED  FOR SWELLING 90 tablet 0   glimepiride (AMARYL) 1 MG tablet TAKE 1 TABLET(1 MG) BY MOUTH DAILY WITH BREAKFAST 90 tablet 0   glucose blood (FREESTYLE LITE) test strip USE TO TEST BLOOD SUGAR TWICE DAILY AS DIRECTED 100 each 6   Insulin Pen Needle (NOVOFINE) 32G X 6 MM MISC Use with Victoza 100 each 6   Lancets MISC by Does not apply route. Check blood sugars qam and once daily prn      lidocaine (XYLOCAINE) 2 % jelly Apply 1 application topically as needed. 30 mL 0   Liraglutide (VICTOZA) 18 MG/3ML SOPN Inject 0.3 mLs (1.8 mg total) into the skin daily. 9 mL 3   lisinopril (PRINIVIL,ZESTRIL) 5 MG tablet TAKE 1 TABLET(5 MG) BY MOUTH DAILY 90 tablet 0   metFORMIN (GLUCOPHAGE-XR) 500 MG 24 hr tablet TAKE 1 TABLET BY MOUTH IN THE MORNING AND 2 TABLETS EVERY NIGHT AT BEDTIME 270 tablet 0   omeprazole (PRILOSEC) 20 MG capsule TAKE 1 CAPSULE (20 MG TOTAL) BY MOUTH DAILY. 90 capsule 1   rosuvastatin (CRESTOR) 5 MG tablet TAKE ONE TABLET EVERY OTHER DAY. 30 tablet 1   No current facility-administered medications on file prior to visit.     PAST MEDICAL HISTORY: Past Medical History:  Diagnosis Date   ABDOMINAL PAIN, RIGHT UPPER QUADRANT, HX OF 01/24/2011   Actinic keratoses 07/18/2011   Arthralgia 05/03/2015   Back pain 12/08/2011   Basal cell carcinoma of skin of lip  01/24/2011   Blood transfusion without reported diagnosis    Bronchitis 12/20/2012   Cataract    bilataeral cateracts removed   CHOLELITHIASIS, HX OF 02/07/2011   Chronic kidney disease    Diabetes mellitus    Diabetes mellitus type 2 in obese ([place]) 01/24/2011   Qualifier: Diagnosis of  By: [name], Stacey Cooper  Annual eye exam with [facility] on 06/14/13    DM 01/24/2011   Essential hypertension, benign 01/24/2011   Fatty liver    GERD 01/24/2011   Grief reaction 01/20/2014   HAIR LOSS 01/24/2011   Hearing loss of both ears 06/28/2013   Hepatic cirrhosis ([place]) 04/27/2015   Hernia, abdominal     Hyperlipidemia 04/24/2012   Hyperlipidemia, mixed 04/24/2012   Insomnia 07/24/2012   Joint pain    Leg cramps 06/06/2013   MEASLES, HX OF 01/24/2011   Morbid obesity ([place]) 01/24/2011   NONSPEC ELEVATION OF LEVELS OF TRANSAMINASE/LDH 02/07/2011   Peripheral neuropathy 04/20/2011   Preventative health care 08/23/2016   Right hip pain 05/03/2015   SCC (squamous cell carcinoma), face 07/18/2011   SQUAMOUS CELL CARCINOMA OTHER SPEC SITES SKIN 01/24/2011   Swelling    Tubular adenoma of colon 27/2536   Umbilical hernia 6/44/0347   URINARY INCONTINENCE 02/07/2011   Urinary tract infection, site not specified 04/30/2014   UTI'S, HX OF 01/24/2011   Vulvar atrophy 03/17/2017    PAST SURGICAL HISTORY: Past Surgical History:  Procedure Laterality Date   ABDOMINAL HYSTERECTOMY     partial, both ovaries left in place   [facility] removed below lower lip on left  26 yrs ago   cataract extraction, b/l     COLONOSCOPY     Left knee arthroscopy and cleaning prior to TKR     skin bx, right anterior CW , squamous cancer, had to redo to margins  12/2010   total knee replacement, b/l      SOCIAL HISTORY: Social History  Substance Use Topics   Smoking status: Never Smoker   Smokeless tobacco: Never Used   Alcohol use No    FAMILY HISTORY: Family History  Problem Relation Age of Onset   Arthritis Mother        RA   Myelodysplastic syndrome Mother    Heart disease Mother    Cancer Father        skin   Stroke Father    Colon polyps Father        29 in [place] intes removed carcinoid tumor confined in the tumor   Stroke Maternal Grandmother    Kidney failure Maternal Grandfather    Esophageal cancer Neg Hx    Rectal cancer Neg Hx    Stomach cancer Neg Hx     ROS: Review of Systems  Constitutional: Positive for weight loss.  Gastrointestinal: Negative for nausea and vomiting.  Musculoskeletal:       Negative muscle weakness  Endo/Heme/Allergies:        Hypoglycemia    PHYSICAL EXAM: Blood pressure 120/74, pulse 88, temperature 97.5 F (36.4 C), temperature source Oral, height 5\' 2"  (1.575 m), weight 210 lb (95.3 kg), SpO2 98 %. Body mass index is 38.41 kg/m. Physical Exam  Constitutional: She is oriented to person, place, and time. She appears well-developed and well-nourished.  Cardiovascular: Normal rate.   Pulmonary/Chest: Effort normal.  Musculoskeletal: Normal range of motion.  Neurological: She is oriented to person, place, and time.  Skin: Skin is warm and dry.  Psychiatric: She has a normal mood  and affect. Her behavior is normal.  Vitals reviewed.   RECENT LABS AND TESTS: BMET    Component Value Date/Time   NA 141 02/15/2017 0712   K 4.1 02/15/2017 0712   CL 108 02/15/2017 0712   CO2 28 02/15/2017 0712   GLUCOSE 122 (H) 02/15/2017 0712   BUN 15 02/15/2017 0712   CREATININE 0.83 02/15/2017 0712   CREATININE 0.73 07/27/2015 0751   CALCIUM 9.2 02/15/2017 0712   GFRNONAA 80 07/27/2015 0751   GFRAA >89 07/27/2015 0751   Lab Results  Component Value Date   HGBA1C 6.8 (H) 05/15/2017   HGBA1C 6.8 (H) 02/15/2017   HGBA1C 6.3 08/23/2016   HGBA1C 6.6 (H) 05/11/2016   HGBA1C 6.5 02/12/2016   Lab Results  Component Value Date   INSULIN 16.4 05/15/2017   CBC    Component Value Date/Time   WBC 7.3 02/15/2017 0712   RBC 4.07 02/15/2017 0712   HGB 13.0 02/15/2017 0712   HCT 38.3 02/15/2017 0712   PLT 186.0 02/15/2017 0712   MCV 94.0 02/15/2017 0712   MCH 31.9 04/03/2015 1443   MCHC 33.9 02/15/2017 0712   RDW 13.1 02/15/2017 0712   LYMPHSABS 1.4 05/07/2015 1159   MONOABS 0.8 05/07/2015 1159   EOSABS 0.1 05/07/2015 1159   BASOSABS 0.1 05/07/2015 1159   Iron/TIBC/Ferritin/ %Sat    Component Value Date/Time   IRON 101 01/24/2011 2029   FERRITIN 171 01/24/2011 2029   Lipid Panel     Component Value Date/Time   CHOL 176 05/15/2017 1001   TRIG 141 05/15/2017 1001   HDL 48 05/15/2017 1001   CHOLHDL 4  02/15/2017 0712   VLDL 20.6 02/15/2017 0712   LDLCALC 100 (H) 05/15/2017 1001   Hepatic Function Panel     Component Value Date/Time   PROT 6.7 02/15/2017 0712   ALBUMIN 3.6 02/15/2017 0712   AST 36 02/15/2017 0712   ALT 21 02/15/2017 0712   ALKPHOS 109 02/15/2017 0712   BILITOT 0.8 02/15/2017 0712   BILIDIR 0.3 01/19/2015 0751   IBILI 0.9 06/24/2014 0847      Component Value Date/Time   TSH 3.10 02/15/2017 0712   TSH 3.77 08/23/2016 0816   TSH 2.83 02/12/2016 0820    ASSESSMENT AND PLAN: Vitamin D deficiency - Plan: Vitamin D, Ergocalciferol, (DRISDOL) 50000 units CAPS capsule  Type 2 diabetes mellitus without complication, without long-term current use of insulin (HCC)  Other cirrhosis of liver (HCC)  Class 2 obesity without serious comorbidity with body mass index (BMI) of 38.0 to 38.9 in adult, unspecified obesity type  PLAN:  Vitamin D Deficiency Stacey Cooper was informed that low vitamin D levels contributes to fatigue and are associated with obesity, breast, and colon cancer. She agrees to start to take prescription Vit D @50 ,000 IU every week #4 with no refills and will follow up for routine testing of vitamin D, at least 2-3 times per year. She was informed of the risk of over-replacement of vitamin D and agrees to not increase her dose unless he discusses this with [name] first. Stacey Cooper agrees to follow up with our clinic in 2 weeks.  Diabetes II Stacey Cooper has been given extensive diabetes education by myself today including ideal fasting and post-prandial blood glucose readings, individual ideal Hgb A1c goals  and hypoglycemia prevention. We discussed the importance of good blood sugar control to decrease the likelihood of diabetic complications such as nephropathy, neuropathy, limb loss, blindness, coronary artery disease, and death. We discussed the importance of  intensive lifestyle modification including diet, exercise and weight loss as the first line treatment for diabetes. Stacey Cooper  agrees to continue metformin and victoza and check blood glucose daily sometimes in morning and sometimes 1 to 2 hours post prandial. Stacey Cooper agrees to continue diet and  follow up at the agreed upon time.  Cirrhosis We discussed lab test results and emphasized need for weight loss. We discussed avoiding ETOH and Tylenol. Stacey Cooper agrees to follow up with our clinic in 2 weeks.  Obesity Stacey Cooper is currently in the action stage of change. As such, her goal is to continue with weight loss efforts She has agreed to follow the Category 2 plan +100 calories Stacey Cooper has been instructed to work up to a goal of 150 minutes of combined cardio and strengthening exercise per week for weight loss and overall health benefits. We discussed the following Behavioral Modification Strategies today: increasing lean protein intake, no skipping meals and better snacking choices. Stacey Cooper was re-educated on how to weigh and measure portion and eat all her prescribed food. Added extra 100 to 150 grams of protein at bedtime snack.  Stacey Cooper has agreed to follow up with our clinic in 2 weeks. She was informed of the importance of frequent follow up visits to maximize her success with intensive lifestyle modifications for her multiple health conditions.  I, Stacey Cooper, am acting as scribe for Stacey Nip, MD  I have reviewed the above documentation for accuracy and completeness, and I agree with the above. -Stacey Nip, MD  OBESITY BEHAVIORAL INTERVENTION VISIT  Today's visit was # 2 out of 30.  Starting weight: 223 lbs Starting date: 05/15/17 Today's weight : 210 lbs Today's date: 05/29/2017 Total lbs lost to date: 13 (Patients must lose 7 lbs in the first 6 months to continue with counseling)   ASK: We discussed the diagnosis of obesity with Stacey Cooper today and Stacey Cooper agreed to give [name] permission to discuss obesity behavioral modification therapy today.  ASSESS: Stacey Cooper has the diagnosis of obesity and her BMI  today is 38.5 Stacey Cooper is in the action stage of change   ADVISE: Stacey Cooper was educated on the multiple health risks of obesity as well as the benefit of weight loss to improve her health. She was advised of the need for long term treatment and the importance of lifestyle modifications.  AGREE: Multiple dietary modification options and treatment options were discussed and  Stacey Cooper agreed to follow the Category 2 plan +100 calories We discussed the following Behavioral Modification Strategies today: increasing lean protein intake, no skipping meals and better snacking choices.

## 2017-05-30 ENCOUNTER — Telehealth: Payer: Self-pay

## 2017-05-30 DIAGNOSIS — K7469 Other cirrhosis of liver: Secondary | ICD-10-CM

## 2017-05-30 LAB — AFP TUMOR MARKER: AFP-Tumor Marker: 3.9 ng/mL (ref <6.1)

## 2017-05-30 NOTE — Telephone Encounter (Signed)
Patient has been scheduled for [facility] on 06/06/17 9:00.  She is notified to be NPO after midnight and arrive at 8:45

## 2017-05-30 NOTE — Telephone Encounter (Signed)
-----   Message from Marlon Pel, RN sent at 11/29/2016  9:52 AM EST ----- Needs [name]- see [name] report 11/29/16- [place] Plan

## 2017-06-01 ENCOUNTER — Other Ambulatory Visit: Payer: Self-pay

## 2017-06-01 DIAGNOSIS — K7469 Other cirrhosis of liver: Secondary | ICD-10-CM

## 2017-06-06 ENCOUNTER — Ambulatory Visit (HOSPITAL_COMMUNITY)
Admission: RE | Admit: 2017-06-06 | Discharge: 2017-06-06 | Disposition: A | Discharge status: Home / Self Care | Payer: Medicare Other | Source: Ambulatory Visit | Attending: Gastroenterology | Admitting: Gastroenterology

## 2017-06-06 DIAGNOSIS — K8020 Calculus of gallbladder without cholecystitis without obstruction: Secondary | ICD-10-CM | POA: Insufficient documentation

## 2017-06-06 DIAGNOSIS — N200 Calculus of kidney: Secondary | ICD-10-CM | POA: Insufficient documentation

## 2017-06-06 DIAGNOSIS — K7469 Other cirrhosis of liver: Secondary | ICD-10-CM | POA: Diagnosis present

## 2017-06-07 ENCOUNTER — Other Ambulatory Visit: Payer: Self-pay | Admitting: Family Medicine

## 2017-06-15 ENCOUNTER — Ambulatory Visit (INDEPENDENT_AMBULATORY_CARE_PROVIDER_SITE_OTHER): Payer: Medicare Other | Admitting: Family Medicine

## 2017-06-15 VITALS — BP 102/69 | HR 95 | Temp 97.5°F | Ht 62.0 in | Wt 204.0 lb

## 2017-06-15 DIAGNOSIS — E559 Vitamin D deficiency, unspecified: Secondary | ICD-10-CM

## 2017-06-15 DIAGNOSIS — E669 Obesity, unspecified: Secondary | ICD-10-CM | POA: Diagnosis not present

## 2017-06-15 DIAGNOSIS — Z6837 Body mass index (BMI) 37.0-37.9, adult: Secondary | ICD-10-CM

## 2017-06-15 DIAGNOSIS — E119 Type 2 diabetes mellitus without complications: Secondary | ICD-10-CM

## 2017-06-15 MED ORDER — VITAMIN D (ERGOCALCIFEROL) 1.25 MG (50000 UNIT) PO CAPS: 50000.0000 [IU] | ORAL_CAPSULE | ORAL | 0 refills | Status: DC

## 2017-06-15 NOTE — Progress Notes (Signed)
Office: [phone]  /  Fax: [phone]   HPI:   Chief Complaint: OBESITY Stacey Cooper is here to discuss her progress with her obesity treatment plan. She is on the  follow the Category 2 plan and is following her eating plan approximately 100 % of the time. She states she is exercising 0 minutes 0 times per week. Stacey Cooper continues to do well on category 2 plan. She still struggles to eat all her food. She has good support at home and with friends. Stacey Cooper is getting ready to travel for the next 3 to 4 weeks. Her weight is 204 lb (92.5 kg) today and has had a weight loss of 6 pounds over a period of 2 weeks since her last visit. She has lost 19 lbs since starting treatment with [name].  Vitamin D deficiency Stacey Cooper has a diagnosis of vitamin D deficiency. She is currently stable on vit D, not yet at goal and denies nausea, vomiting or muscle weakness.  Diabetes II Stacey Cooper has a diagnosis of diabetes type II. Stacey Cooper states BGs range between 100 and 120's and denies any hypoglycemic episodes. Last A1c was at 6.8 She has been working on intensive lifestyle modifications including diet, exercise, and weight loss to help control her blood glucose levels. Stacey Cooper notes decreased polyphagia on victoza and metformin.   ALLERGIES: Allergies  Allergen Reactions  . Contrast Media [Iodinated Diagnostic Agents] Shortness Of Breath  . Lovastatin     myalgias    MEDICATIONS: Current Outpatient Prescriptions on File Prior to Visit  Medication Sig Dispense Refill  . fluticasone (FLONASE) 50 MCG/ACT nasal spray Place 2 sprays into both nostrils daily. 48 g 0  . furosemide (LASIX) 20 MG tablet TAKE 1 TABLET(20 MG) BY MOUTH DAILY AS NEEDED FOR SWELLING 90 tablet 0  . glimepiride (AMARYL) 1 MG tablet TAKE 1 TABLET(1 MG) BY MOUTH DAILY WITH BREAKFAST 90 tablet 0  . glucose blood (FREESTYLE LITE) test strip USE TO TEST BLOOD SUGAR TWICE DAILY AS DIRECTED 100 each 6  . Insulin Pen Needle (NOVOFINE) 32G X 6 MM MISC Use  with Victoza 100 each 6  . Lancets MISC by Does not apply route. Check blood sugars qam and once daily prn     . lidocaine (XYLOCAINE) 2 % jelly Apply 1 application topically as needed. 30 mL 0  . Liraglutide (VICTOZA) 18 MG/3ML SOPN Inject 0.3 mLs (1.8 mg total) into the skin daily. 9 mL 3  . lisinopril (PRINIVIL,ZESTRIL) 5 MG tablet TAKE 1 TABLET(5 MG) BY MOUTH DAILY 90 tablet 1  . metFORMIN (GLUCOPHAGE-XR) 500 MG 24 hr tablet TAKE 1 TABLET BY MOUTH IN THE MORNING AND 2 TABLETS EVERY NIGHT AT BEDTIME 270 tablet 1  . omeprazole (PRILOSEC) 20 MG capsule TAKE 1 CAPSULE (20 MG TOTAL) BY MOUTH DAILY. 90 capsule 1  . rosuvastatin (CRESTOR) 5 MG tablet TAKE ONE TABLET EVERY OTHER DAY. 30 tablet 1   No current facility-administered medications on file prior to visit.     PAST MEDICAL HISTORY: Past Medical History:  Diagnosis Date  . ABDOMINAL PAIN, RIGHT UPPER QUADRANT, HX OF 01/24/2011  . Actinic keratoses 07/18/2011  . Arthralgia 05/03/2015  . Back pain 12/08/2011  . Basal cell carcinoma of skin of lip 01/24/2011  . Blood transfusion without reported diagnosis   . Bronchitis 12/20/2012  . Cataract    bilataeral cateracts removed  . CHOLELITHIASIS, HX OF 02/07/2011  . Chronic kidney disease   . Diabetes mellitus   . Diabetes mellitus type 2  in obese [facility]) 01/24/2011   Qualifier: Diagnosis of  By: [name], [name]  Annual eye exam with [facility] on 06/14/13   . DM 01/24/2011  . Essential hypertension, benign 01/24/2011  . Fatty liver   . GERD 01/24/2011  . Grief reaction 01/20/2014  . HAIR LOSS 01/24/2011  . Hearing loss of both ears 06/28/2013  . Hepatic cirrhosis ([place]) 04/27/2015  . Hernia, abdominal   . Hyperlipidemia 04/24/2012  . Hyperlipidemia, mixed 04/24/2012  . Insomnia 07/24/2012  . Joint pain   . Leg cramps 06/06/2013  . MEASLES, HX OF 01/24/2011  . Morbid obesity ([place]) 01/24/2011  . NONSPEC ELEVATION OF LEVELS OF TRANSAMINASE/LDH 02/07/2011  . Peripheral neuropathy 04/20/2011  .  Preventative health care 08/23/2016  . Right hip pain 05/03/2015  . SCC (squamous cell carcinoma), face 07/18/2011  . SQUAMOUS CELL CARCINOMA OTHER SPEC SITES SKIN 01/24/2011  . Swelling   . Tubular adenoma of colon 04/2015  . Umbilical hernia 1/93/7902  . URINARY INCONTINENCE 02/07/2011  . Urinary tract infection, site not specified 04/30/2014  . UTI'S, HX OF 01/24/2011  . Vulvar atrophy 03/17/2017    PAST SURGICAL HISTORY: Past Surgical History:  Procedure Laterality Date  . ABDOMINAL HYSTERECTOMY     partial, both ovaries left in place  . BCC removed below lower lip on left  26 yrs ago  . cataract extraction, b/l    . COLONOSCOPY    . Left knee arthroscopy and cleaning prior to TKR    . skin bx, right anterior CW , squamous cancer, had to redo to margins  12/2010  . total knee replacement, b/l      SOCIAL HISTORY: Social History  Substance Use Topics  . Smoking status: Never Smoker  . Smokeless tobacco: Never Used  . Alcohol use No    FAMILY HISTORY: Family History  Problem Relation Age of Onset  . Arthritis Mother        RA  . Myelodysplastic syndrome Mother   . Heart disease Mother   . Cancer Father        skin  . Stroke Father   . Colon polyps Father        16 in [place] intes removed carcinoid tumor confined in the tumor  . Stroke Maternal Grandmother   . Kidney failure Maternal Grandfather   . Esophageal cancer Neg Hx   . Rectal cancer Neg Hx   . Stomach cancer Neg Hx     ROS: Review of Systems  Constitutional: Positive for weight loss.  Gastrointestinal: Negative for nausea and vomiting.  Musculoskeletal:       Negative muscle weakness   Endo/Heme/Allergies:       Polyphagia Negative hypoglycemia    PHYSICAL EXAM: Blood pressure 102/69, pulse 95, temperature 97.5 F (36.4 C), temperature source Oral, height 5\' 2"  (1.575 m), weight 204 lb (92.5 kg), SpO2 94 %. Body mass index is 37.31 kg/m. Physical Exam  Constitutional: She is oriented to person,  place, and time. She appears well-developed and well-nourished.  Cardiovascular: Normal rate.   Pulmonary/Chest: Effort normal.  Musculoskeletal: Normal range of motion.  Neurological: She is oriented to person, place, and time.  Skin: Skin is warm and dry.  Psychiatric: She has a normal mood and affect. Her behavior is normal.  Vitals reviewed.   RECENT LABS AND TESTS: BMET    Component Value Date/Time   NA 141 02/15/2017 0712   K 4.1 02/15/2017 0712   CL 108 02/15/2017 4097  CO2 28 02/15/2017 0712   GLUCOSE 122 (H) 02/15/2017 0712   BUN 15 02/15/2017 0712   CREATININE 0.83 02/15/2017 0712   CREATININE 0.73 07/27/2015 0751   CALCIUM 9.2 02/15/2017 0712   GFRNONAA 80 07/27/2015 0751   GFRAA >89 07/27/2015 0751   Lab Results  Component Value Date   HGBA1C 6.8 (H) 05/15/2017   HGBA1C 6.8 (H) 02/15/2017   HGBA1C 6.3 08/23/2016   HGBA1C 6.6 (H) 05/11/2016   HGBA1C 6.5 02/12/2016   Lab Results  Component Value Date   INSULIN 16.4 05/15/2017   CBC    Component Value Date/Time   WBC 7.3 02/15/2017 0712   RBC 4.07 02/15/2017 0712   HGB 13.0 02/15/2017 0712   HCT 38.3 02/15/2017 0712   PLT 186.0 02/15/2017 0712   MCV 94.0 02/15/2017 0712   MCH 31.9 04/03/2015 1443   MCHC 33.9 02/15/2017 0712   RDW 13.1 02/15/2017 0712   LYMPHSABS 1.4 05/07/2015 1159   MONOABS 0.8 05/07/2015 1159   EOSABS 0.1 05/07/2015 1159   BASOSABS 0.1 05/07/2015 1159   Iron/TIBC/Ferritin/ %Sat    Component Value Date/Time   IRON 101 01/24/2011 2029   FERRITIN 171 01/24/2011 2029   Lipid Panel     Component Value Date/Time   CHOL 176 05/15/2017 1001   TRIG 141 05/15/2017 1001   HDL 48 05/15/2017 1001   CHOLHDL 4 02/15/2017 0712   VLDL 20.6 02/15/2017 0712   LDLCALC 100 (H) 05/15/2017 1001   Hepatic Function Panel     Component Value Date/Time   PROT 6.7 02/15/2017 0712   ALBUMIN 3.6 02/15/2017 0712   AST 36 02/15/2017 0712   ALT 21 02/15/2017 0712   ALKPHOS 109 02/15/2017 0712     BILITOT 0.8 02/15/2017 0712   BILIDIR 0.3 01/19/2015 0751   IBILI 0.9 06/24/2014 0847      Component Value Date/Time   TSH 3.10 02/15/2017 0712   TSH 3.77 08/23/2016 0816   TSH 2.83 02/12/2016 0820    ASSESSMENT AND PLAN: Type 2 diabetes mellitus without complication, without long-term current use of insulin (HCC)  Vitamin D deficiency - Plan: Vitamin D, Ergocalciferol, (DRISDOL) 50000 units CAPS capsule  Class 2 obesity without serious comorbidity with body mass index (BMI) of 37.0 to 37.9 in adult, unspecified obesity type  PLAN:  Vitamin D Deficiency Stacey Cooper was informed that low vitamin D levels contributes to fatigue and are associated with obesity, breast, and colon cancer. She agrees to continue to take prescription Vit D @50 ,000 IU every week, we will refill for 1 month and will follow up for routine testing of vitamin D, at least 2-3 times per year. She was informed of the risk of over-replacement of vitamin D and agrees to not increase her dose unless he discusses this with [name] first. Stacey Cooper agrees to follow up with our clinic in 4 weeks.  Diabetes II Stacey Cooper has been given extensive diabetes education by myself today including ideal fasting and post-prandial blood glucose readings, individual ideal Hgb A1c goals  and hypoglycemia prevention. We discussed the importance of good blood sugar control to decrease the likelihood of diabetic complications such as nephropathy, neuropathy, limb loss, blindness, coronary artery disease, and death. We discussed the importance of intensive lifestyle modification including diet, exercise and weight loss as the first line treatment for diabetes. Stacey Cooper agrees to continue victoza and metformin and will continue with diet exercise and weight loss. Stacey Cooper agrees to follow up at the agreed upon time.  Obesity Stacey Cooper  is currently in the action stage of change. As such, her goal is to continue with weight loss efforts She has agreed to follow the  Category 2 plan Stacey Cooper has been instructed to work up to a goal of 150 minutes of combined cardio and strengthening exercise per week for weight loss and overall health benefits. We discussed the following Behavioral Modification Strategies today: no skipping meals and travel eating strategies  Stacey Cooper has agreed to follow up with our clinic in 4 weeks. She was informed of the importance of frequent follow up visits to maximize her success with intensive lifestyle modifications for her multiple health conditions.  I, Doreene Nest, am acting as transcriptionist for Dennard Nip, MD  I have reviewed the above documentation for accuracy and completeness, and I agree with the above. -Dennard Nip, MD  OBESITY BEHAVIORAL INTERVENTION VISIT  Today's visit was # 3 out of 22.  Starting weight: 223 lbs Starting date: 05/15/17 Today's weight : 204 lbs Today's date: 06/15/2017 Total lbs lost to date: 32 (Patients must lose 7 lbs in the first 6 months to continue with counseling)   ASK: We discussed the diagnosis of obesity with Marquita Palms today and Kendra agreed to give [name] permission to discuss obesity behavioral modification therapy today.  ASSESS: Xan has the diagnosis of obesity and her BMI today is 37.4 Aryona is in the action stage of change   ADVISE: Fadia was educated on the multiple health risks of obesity as well as the benefit of weight loss to improve her health. She was advised of the need for long term treatment and the importance of lifestyle modifications.  AGREE: Multiple dietary modification options and treatment options were discussed and  Briseida agreed to follow the Category 2 plan We discussed the following Behavioral Modification Strategies today: no skipping meals and travel eating strategies

## 2017-07-13 ENCOUNTER — Ambulatory Visit (INDEPENDENT_AMBULATORY_CARE_PROVIDER_SITE_OTHER): Payer: Medicare Other | Admitting: Family Medicine

## 2017-07-13 VITALS — BP 114/70 | HR 88 | Temp 97.5°F | Ht 62.0 in | Wt 196.0 lb

## 2017-07-13 DIAGNOSIS — Z6835 Body mass index (BMI) 35.0-35.9, adult: Secondary | ICD-10-CM | POA: Diagnosis not present

## 2017-07-13 DIAGNOSIS — E669 Obesity, unspecified: Secondary | ICD-10-CM | POA: Diagnosis not present

## 2017-07-13 DIAGNOSIS — E559 Vitamin D deficiency, unspecified: Secondary | ICD-10-CM

## 2017-07-13 DIAGNOSIS — E119 Type 2 diabetes mellitus without complications: Secondary | ICD-10-CM

## 2017-07-13 DIAGNOSIS — IMO0001 Reserved for inherently not codable concepts without codable children: Secondary | ICD-10-CM

## 2017-07-13 MED ORDER — VITAMIN D (ERGOCALCIFEROL) 1.25 MG (50000 UNIT) PO CAPS: 50000.0000 [IU] | ORAL_CAPSULE | ORAL | 0 refills | Status: DC

## 2017-07-13 NOTE — Progress Notes (Signed)
Office: [phone]  /  Fax: [phone]   HPI:   Chief Complaint: OBESITY Stacey Cooper is here to discuss her progress with her obesity treatment plan. She is on the  follow the Category 2 plan and is following her eating plan approximately 100 % of the time. She states she is exercising 0 minutes 0 times per week. Stacey Cooper continues to do well with weight loss. She is following category 2 plan closely, hunger is controlled on plan but she is still struggling with night time cravings. Her weight is 196 lb (88.9 kg) today and has had a weight loss of 8 pounds over a period of 4 weeks since her last visit. She has lost 27 lbs since starting treatment with [name].  Vitamin D deficiency Stacey Cooper has a diagnosis of vitamin D deficiency. She is currently stable on vit D, not yet at goal of >50. She denies nausea, vomiting or muscle weakness.  Diabetes II Stacey Cooper has a diagnosis of diabetes type II. Stacey Cooper states fasting BGs range between 95 and 105 on average and denies any hypoglycemic episodes. Last A1c was at 6.8  She has been working on intensive lifestyle modifications including diet, exercise, and weight loss to help control her blood glucose levels and is doing well with diet and weight loss. She is on metformin and victoza.   ALLERGIES: Allergies  Allergen Reactions  . Contrast Media [Iodinated Diagnostic Agents] Shortness Of Breath  . Lovastatin     myalgias    MEDICATIONS: Current Outpatient Prescriptions on File Prior to Visit  Medication Sig Dispense Refill  . fluticasone (FLONASE) 50 MCG/ACT nasal spray Place 2 sprays into both nostrils daily. 48 g 0  . furosemide (LASIX) 20 MG tablet TAKE 1 TABLET(20 MG) BY MOUTH DAILY AS NEEDED FOR SWELLING 90 tablet 0  . glucose blood (FREESTYLE LITE) test strip USE TO TEST BLOOD SUGAR TWICE DAILY AS DIRECTED 100 each 6  . Insulin Pen Needle (NOVOFINE) 32G X 6 MM MISC Use with Victoza 100 each 6  . Lancets MISC by Does not apply route. Check blood sugars  qam and once daily prn     . lidocaine (XYLOCAINE) 2 % jelly Apply 1 application topically as needed. 30 mL 0  . Liraglutide (VICTOZA) 18 MG/3ML SOPN Inject 0.3 mLs (1.8 mg total) into the skin daily. 9 mL 3  . lisinopril (PRINIVIL,ZESTRIL) 5 MG tablet TAKE 1 TABLET(5 MG) BY MOUTH DAILY 90 tablet 1  . metFORMIN (GLUCOPHAGE-XR) 500 MG 24 hr tablet TAKE 1 TABLET BY MOUTH IN THE MORNING AND 2 TABLETS EVERY NIGHT AT BEDTIME 270 tablet 1  . rosuvastatin (CRESTOR) 5 MG tablet TAKE ONE TABLET EVERY OTHER DAY. 30 tablet 1   No current facility-administered medications on file prior to visit.     PAST MEDICAL HISTORY: Past Medical History:  Diagnosis Date  . ABDOMINAL PAIN, RIGHT UPPER QUADRANT, HX OF 01/24/2011  . Actinic keratoses 07/18/2011  . Arthralgia 05/03/2015  . Back pain 12/08/2011  . Basal cell carcinoma of skin of lip 01/24/2011  . Blood transfusion without reported diagnosis   . Bronchitis 12/20/2012  . Cataract    bilataeral cateracts removed  . CHOLELITHIASIS, HX OF 02/07/2011  . Chronic kidney disease   . Diabetes mellitus   . Diabetes mellitus type 2 in obese [facility]) 01/24/2011   Qualifier: Diagnosis of  By: [name], [name]  Annual eye exam with [facility] on 06/14/13   . DM 01/24/2011  . Essential hypertension, benign 01/24/2011  .  Fatty liver   . GERD 01/24/2011  . Grief reaction 01/20/2014  . HAIR LOSS 01/24/2011  . Hearing loss of both ears 06/28/2013  . Hepatic cirrhosis ([place]) 04/27/2015  . Hernia, abdominal   . Hyperlipidemia 04/24/2012  . Hyperlipidemia, mixed 04/24/2012  . Insomnia 07/24/2012  . Joint pain   . Leg cramps 06/06/2013  . MEASLES, HX OF 01/24/2011  . Morbid obesity ([place]) 01/24/2011  . NONSPEC ELEVATION OF LEVELS OF TRANSAMINASE/LDH 02/07/2011  . Peripheral neuropathy 04/20/2011  . Preventative health care 08/23/2016  . Right hip pain 05/03/2015  . SCC (squamous cell carcinoma), face 07/18/2011  . SQUAMOUS CELL CARCINOMA OTHER SPEC SITES SKIN 01/24/2011  .  Swelling   . Tubular adenoma of colon 04/2015  . Umbilical hernia 04/21/622  . URINARY INCONTINENCE 02/07/2011  . Urinary tract infection, site not specified 04/30/2014  . UTI'S, HX OF 01/24/2011  . Vulvar atrophy 03/17/2017    PAST SURGICAL HISTORY: Past Surgical History:  Procedure Laterality Date  . ABDOMINAL HYSTERECTOMY     partial, both ovaries left in place  . BCC removed below lower lip on left  26 yrs ago  . cataract extraction, b/l    . COLONOSCOPY    . Left knee arthroscopy and cleaning prior to TKR    . skin bx, right anterior CW , squamous cancer, had to redo to margins  12/2010  . total knee replacement, b/l      SOCIAL HISTORY: Social History  Substance Use Topics  . Smoking status: Never Smoker  . Smokeless tobacco: Never Used  . Alcohol use No    FAMILY HISTORY: Family History  Problem Relation Age of Onset  . Arthritis Mother        RA  . Myelodysplastic syndrome Mother   . Heart disease Mother   . Cancer Father        skin  . Stroke Father   . Colon polyps Father        78 in [place] intes removed carcinoid tumor confined in the tumor  . Stroke Maternal Grandmother   . Kidney failure Maternal Grandfather   . Esophageal cancer Neg Hx   . Rectal cancer Neg Hx   . Stomach cancer Neg Hx     ROS: Review of Systems  Constitutional: Positive for weight loss.  Gastrointestinal: Negative for nausea and vomiting.  Musculoskeletal:       Negative muscle weakness  Endo/Heme/Allergies:       Negative hypoglycemia    PHYSICAL EXAM: Blood pressure 114/70, pulse 88, temperature (!) 97.5 F (36.4 C), temperature source Oral, height 5\' 2"  (1.575 m), weight 196 lb (88.9 kg), SpO2 98 %. Body mass index is 35.85 kg/m. Physical Exam  Constitutional: She is oriented to person, place, and time. She appears well-developed and well-nourished.  Cardiovascular: Normal rate.   Pulmonary/Chest: Effort normal.  Musculoskeletal: Normal range of motion.  Neurological:  She is oriented to person, place, and time.  Skin: Skin is warm and dry.  Psychiatric: She has a normal mood and affect. Her behavior is normal.  Vitals reviewed.   RECENT LABS AND TESTS: BMET    Component Value Date/Time   NA 141 02/15/2017 0712   K 4.1 02/15/2017 0712   CL 108 02/15/2017 0712   CO2 28 02/15/2017 0712   GLUCOSE 122 (H) 02/15/2017 0712   BUN 15 02/15/2017 0712   CREATININE 0.83 02/15/2017 0712   CREATININE 0.73 07/27/2015 0751   CALCIUM 9.2 02/15/2017 7628  GFRNONAA 80 07/27/2015 0751   GFRAA >89 07/27/2015 0751   Lab Results  Component Value Date   HGBA1C 6.8 (H) 05/15/2017   HGBA1C 6.8 (H) 02/15/2017   HGBA1C 6.3 08/23/2016   HGBA1C 6.6 (H) 05/11/2016   HGBA1C 6.5 02/12/2016   Lab Results  Component Value Date   INSULIN 16.4 05/15/2017   CBC    Component Value Date/Time   WBC 7.3 02/15/2017 0712   RBC 4.07 02/15/2017 0712   HGB 13.0 02/15/2017 0712   HCT 38.3 02/15/2017 0712   PLT 186.0 02/15/2017 0712   MCV 94.0 02/15/2017 0712   MCH 31.9 04/03/2015 1443   MCHC 33.9 02/15/2017 0712   RDW 13.1 02/15/2017 0712   LYMPHSABS 1.4 05/07/2015 1159   MONOABS 0.8 05/07/2015 1159   EOSABS 0.1 05/07/2015 1159   BASOSABS 0.1 05/07/2015 1159   Iron/TIBC/Ferritin/ %Sat    Component Value Date/Time   IRON 101 01/24/2011 2029   FERRITIN 171 01/24/2011 2029   Lipid Panel     Component Value Date/Time   CHOL 176 05/15/2017 1001   TRIG 141 05/15/2017 1001   HDL 48 05/15/2017 1001   CHOLHDL 4 02/15/2017 0712   VLDL 20.6 02/15/2017 0712   LDLCALC 100 (H) 05/15/2017 1001   Hepatic Function Panel     Component Value Date/Time   PROT 6.7 02/15/2017 0712   ALBUMIN 3.6 02/15/2017 0712   AST 36 02/15/2017 0712   ALT 21 02/15/2017 0712   ALKPHOS 109 02/15/2017 0712   BILITOT 0.8 02/15/2017 0712   BILIDIR 0.3 01/19/2015 0751   IBILI 0.9 06/24/2014 0847      Component Value Date/Time   TSH 3.10 02/15/2017 0712   TSH 3.77 08/23/2016 0816   TSH  2.83 02/12/2016 0820    ASSESSMENT AND PLAN: Type 2 diabetes mellitus without complication, without long-term current use of insulin (HCC)  Vitamin D deficiency - Plan: Vitamin D, Ergocalciferol, (DRISDOL) 50000 units CAPS capsule  Class 2 obesity with serious comorbidity and body mass index (BMI) of 35.0 to 35.9 in adult, unspecified obesity type  PLAN:  Vitamin D Deficiency Stacey Cooper was informed that low vitamin D levels contributes to fatigue and are associated with obesity, breast, and colon cancer. She agrees to continue to take prescription Vit D @50 ,000 IU every week, we will refill for 1 month and will re-check labs in 1 month and will follow up for routine testing of vitamin D, at least 2-3 times per year. She was informed of the risk of over-replacement of vitamin D and agrees to not increase her dose unless he discusses this with [name] first. Stacey Cooper agrees to follow up with our clinic in 4 weeks.  Diabetes II Stacey Cooper has been given extensive diabetes education by myself today including ideal fasting and post-prandial blood glucose readings, individual ideal Hgb A1c goals  and hypoglycemia prevention. We discussed the importance of good blood sugar control to decrease the likelihood of diabetic complications such as nephropathy, neuropathy, limb loss, blindness, coronary artery disease, and death. We discussed the importance of intensive lifestyle modification including diet, exercise and weight loss as the first line treatment for diabetes. Stacey Cooper agrees to continue with diet and exercise and continue her diabetes medications and will follow up at the agreed upon time.  Obesity Stacey Cooper is currently in the action stage of change. As such, her goal is to continue with weight loss efforts She has agreed to follow the Category 2 plan Stacey Cooper has been instructed to work up  to a goal of 150 minutes of combined cardio and strengthening exercise per week for weight loss and overall health benefits. We  discussed the following Behavioral Modification Strategies today: emotional eating strategies, increase H2O intake, travel eating strategies and planning for success  Stacey Cooper has agreed to follow up with our clinic in 4 weeks. She was informed of the importance of frequent follow up visits to maximize her success with intensive lifestyle modifications for her multiple health conditions.  I, Stacey Cooper, am acting as transcriptionist for Dennard Nip, MD  I have reviewed the above documentation for accuracy and completeness, and I agree with the above. -Dennard Nip, MD   OBESITY BEHAVIORAL INTERVENTION VISIT  Today's visit was # 4 out of 22.  Starting weight: 223 lbs Starting date: 05/15/17 Today's weight : 196 lbs Today's date: 07/13/2017 Total lbs lost to date: 3 (Patients must lose 7 lbs in the first 6 months to continue with counseling)   ASK: We discussed the diagnosis of obesity with Stacey Cooper today and Stacey Cooper agreed to give [name] permission to discuss obesity behavioral modification therapy today.  ASSESS: Stacey Cooper has the diagnosis of obesity and her BMI today is 35.9 Stacey Cooper is in the action stage of change   ADVISE: Stacey Cooper was educated on the multiple health risks of obesity as well as the benefit of weight loss to improve her health. She was advised of the need for long term treatment and the importance of lifestyle modifications.  AGREE: Multiple dietary modification options and treatment options were discussed and  Stacey Cooper agreed to follow the Category 2 plan We discussed the following Behavioral Modification Strategies today: emotional eating strategies, increase H2O intake, travel eating strategies and planning for success

## 2017-07-17 LAB — HM MAMMOGRAPHY

## 2017-07-25 ENCOUNTER — Encounter: Payer: Self-pay | Admitting: Family Medicine

## 2017-08-10 ENCOUNTER — Ambulatory Visit (INDEPENDENT_AMBULATORY_CARE_PROVIDER_SITE_OTHER): Payer: Medicare Other | Admitting: Family Medicine

## 2017-08-10 VITALS — BP 107/65 | HR 79 | Temp 98.0°F | Ht 62.0 in | Wt 192.0 lb

## 2017-08-10 DIAGNOSIS — Z6835 Body mass index (BMI) 35.0-35.9, adult: Secondary | ICD-10-CM | POA: Diagnosis not present

## 2017-08-10 DIAGNOSIS — Z794 Long term (current) use of insulin: Secondary | ICD-10-CM | POA: Diagnosis not present

## 2017-08-10 DIAGNOSIS — E559 Vitamin D deficiency, unspecified: Secondary | ICD-10-CM

## 2017-08-10 DIAGNOSIS — E119 Type 2 diabetes mellitus without complications: Secondary | ICD-10-CM

## 2017-08-10 DIAGNOSIS — IMO0001 Reserved for inherently not codable concepts without codable children: Secondary | ICD-10-CM

## 2017-08-10 DIAGNOSIS — E669 Obesity, unspecified: Secondary | ICD-10-CM

## 2017-08-10 NOTE — Progress Notes (Signed)
Office: [phone]  /  Fax: [phone]   HPI:   Chief Complaint: OBESITY Stacey Cooper is here to discuss her progress with her obesity treatment plan. She is on the Category 2 plan and is following her eating plan approximately 100 % of the time. She states she is walking and mowing for 30 minutes 5 times per week. Stacey Cooper continues to do well with weight loss. She plans ahead well and follows the plan. Her weight is 192 lb (87.1 kg) today and has had a weight loss of 4 pounds over a period of 4 weeks since her last visit. She has lost 31 lbs since starting treatment with [name].  Vitamin D deficiency Stacey Cooper has a diagnosis of vitamin D deficiency. She is currently taking vit D and denies nausea, vomiting or muscle weakness.  Diabetes II Stacey Cooper has a diagnosis of diabetes type II. Stacey Cooper states fasting BGs range in the 100's and she denies any hypoglycemic episodes. She has been working on intensive lifestyle modifications including diet, exercise, and weight loss to help control her blood glucose levels.   ALLERGIES: Allergies  Allergen Reactions   Contrast Media [Iodinated Diagnostic Agents] Shortness Of Breath   Lovastatin     myalgias    MEDICATIONS: Current Outpatient Prescriptions on File Prior to Visit  Medication Sig Dispense Refill   fluticasone (FLONASE) 50 MCG/ACT nasal spray Place 2 sprays into both nostrils daily. 48 g 0   furosemide (LASIX) 20 MG tablet TAKE 1 TABLET(20 MG) BY MOUTH DAILY AS NEEDED FOR SWELLING 90 tablet 0   glucose blood (FREESTYLE LITE) test strip USE TO TEST BLOOD SUGAR TWICE DAILY AS DIRECTED 100 each 6   Insulin Pen Needle (NOVOFINE) 32G X 6 MM MISC Use with Victoza 100 each 6   Lancets MISC by Does not apply route. Check blood sugars qam and once daily prn      lidocaine (XYLOCAINE) 2 % jelly Apply 1 application topically as needed. 30 mL 0   Liraglutide (VICTOZA) 18 MG/3ML SOPN Inject 0.3 mLs (1.8 mg total) into the skin daily. 9 mL 3    lisinopril (PRINIVIL,ZESTRIL) 5 MG tablet TAKE 1 TABLET(5 MG) BY MOUTH DAILY 90 tablet 1   metFORMIN (GLUCOPHAGE-XR) 500 MG 24 hr tablet TAKE 1 TABLET BY MOUTH IN THE MORNING AND 2 TABLETS EVERY NIGHT AT BEDTIME 270 tablet 1   rosuvastatin (CRESTOR) 5 MG tablet TAKE ONE TABLET EVERY OTHER DAY. 30 tablet 1   Vitamin D, Ergocalciferol, (DRISDOL) 50000 units CAPS capsule Take 1 capsule (50,000 Units total) by mouth every 7 (seven) days. 4 capsule 0   No current facility-administered medications on file prior to visit.     PAST MEDICAL HISTORY: Past Medical History:  Diagnosis Date   ABDOMINAL PAIN, RIGHT UPPER QUADRANT, HX OF 01/24/2011   Actinic keratoses 07/18/2011   Arthralgia 05/03/2015   Back pain 12/08/2011   Basal cell carcinoma of skin of lip 01/24/2011   Blood transfusion without reported diagnosis    Bronchitis 12/20/2012   Cataract    bilataeral cateracts removed   CHOLELITHIASIS, HX OF 02/07/2011   Chronic kidney disease    Diabetes mellitus    Diabetes mellitus type 2 in obese ([employer]) 01/24/2011   Qualifier: Diagnosis of  By: [name], [name]  Annual eye exam with [facility] on 06/14/13    DM 01/24/2011   Essential hypertension, benign 01/24/2011   Fatty liver    GERD 01/24/2011   Grief reaction 01/20/2014   HAIR LOSS 01/24/2011  Hearing loss of both ears 06/28/2013   Hepatic cirrhosis (HCC) 04/27/2015   Hernia, abdominal    Hyperlipidemia 04/24/2012   Hyperlipidemia, mixed 04/24/2012   Insomnia 07/24/2012   Joint pain    Leg cramps 06/06/2013   MEASLES, HX OF 01/24/2011   Morbid obesity ([place]) 01/24/2011   NONSPEC ELEVATION OF LEVELS OF TRANSAMINASE/LDH 02/07/2011   Peripheral neuropathy 04/20/2011   Preventative health care 08/23/2016   Right hip pain 05/03/2015   SCC (squamous cell carcinoma), face 07/18/2011   SQUAMOUS CELL CARCINOMA OTHER SPEC SITES SKIN 01/24/2011   Swelling    Tubular adenoma of colon 16/1096   Umbilical hernia  0/45/4098   URINARY INCONTINENCE 02/07/2011   Urinary tract infection, site not specified 04/30/2014   UTI'S, HX OF 01/24/2011   Vulvar atrophy 03/17/2017    PAST SURGICAL HISTORY: Past Surgical History:  Procedure Laterality Date   ABDOMINAL HYSTERECTOMY     partial, both ovaries left in place   [facility] removed below lower lip on left  26 yrs ago   cataract extraction, b/l     COLONOSCOPY     Left knee arthroscopy and cleaning prior to TKR     skin bx, right anterior CW , squamous cancer, had to redo to margins  12/2010   total knee replacement, b/l      SOCIAL HISTORY: Social History  Substance Use Topics   Smoking status: Never Smoker   Smokeless tobacco: Never Used   Alcohol use No    FAMILY HISTORY: Family History  Problem Relation Age of Onset   Arthritis Mother        RA   Myelodysplastic syndrome Mother    Heart disease Mother    Cancer Father        skin   Stroke Father    Colon polyps Father        25 in [place] intes removed carcinoid tumor confined in the tumor   Stroke Maternal Grandmother    Kidney failure Maternal Grandfather    Esophageal cancer Neg Hx    Rectal cancer Neg Hx    Stomach cancer Neg Hx     ROS: Review of Systems  Constitutional: Positive for weight loss.  Gastrointestinal: Negative for nausea and vomiting.  Musculoskeletal:       Negative muscle weakness  Endo/Heme/Allergies:       Negative hypoglycemia    PHYSICAL EXAM: Blood pressure 107/65, pulse 79, temperature 98 F (36.7 C), temperature source Oral, height 5\' 2"  (1.575 m), weight 192 lb (87.1 kg), SpO2 97 %. Body mass index is 35.12 kg/m. Physical Exam  Constitutional: She is oriented to person, place, and time. She appears well-developed and well-nourished.  Cardiovascular: Normal rate.   Pulmonary/Chest: Effort normal.  Musculoskeletal: Normal range of motion.  Neurological: She is oriented to person, place, and time.  Skin: Skin is warm and dry.    Psychiatric: She has a normal mood and affect. Her behavior is normal.  Vitals reviewed.   RECENT LABS AND TESTS: BMET    Component Value Date/Time   NA 141 02/15/2017 0712   K 4.1 02/15/2017 0712   CL 108 02/15/2017 0712   CO2 28 02/15/2017 0712   GLUCOSE 122 (H) 02/15/2017 0712   BUN 15 02/15/2017 0712   CREATININE 0.83 02/15/2017 0712   CREATININE 0.73 07/27/2015 0751   CALCIUM 9.2 02/15/2017 0712   GFRNONAA 80 07/27/2015 0751   GFRAA >89 07/27/2015 0751   Lab Results  Component Value Date  HGBA1C 6.8 (H) 05/15/2017   HGBA1C 6.8 (H) 02/15/2017   HGBA1C 6.3 08/23/2016   HGBA1C 6.6 (H) 05/11/2016   HGBA1C 6.5 02/12/2016   Lab Results  Component Value Date   INSULIN 16.4 05/15/2017   CBC    Component Value Date/Time   WBC 7.3 02/15/2017 0712   RBC 4.07 02/15/2017 0712   HGB 13.0 02/15/2017 0712   HCT 38.3 02/15/2017 0712   PLT 186.0 02/15/2017 0712   MCV 94.0 02/15/2017 0712   MCH 31.9 04/03/2015 1443   MCHC 33.9 02/15/2017 0712   RDW 13.1 02/15/2017 0712   LYMPHSABS 1.4 05/07/2015 1159   MONOABS 0.8 05/07/2015 1159   EOSABS 0.1 05/07/2015 1159   BASOSABS 0.1 05/07/2015 1159   Iron/TIBC/Ferritin/ %Sat    Component Value Date/Time   IRON 101 01/24/2011 2029   FERRITIN 171 01/24/2011 2029   Lipid Panel     Component Value Date/Time   CHOL 176 05/15/2017 1001   TRIG 141 05/15/2017 1001   HDL 48 05/15/2017 1001   CHOLHDL 4 02/15/2017 0712   VLDL 20.6 02/15/2017 0712   LDLCALC 100 (H) 05/15/2017 1001   Hepatic Function Panel     Component Value Date/Time   PROT 6.7 02/15/2017 0712   ALBUMIN 3.6 02/15/2017 0712   AST 36 02/15/2017 0712   ALT 21 02/15/2017 0712   ALKPHOS 109 02/15/2017 0712   BILITOT 0.8 02/15/2017 0712   BILIDIR 0.3 01/19/2015 0751   IBILI 0.9 06/24/2014 0847      Component Value Date/Time   TSH 3.10 02/15/2017 0712   TSH 3.77 08/23/2016 0816   TSH 2.83 02/12/2016 0820    ASSESSMENT AND PLAN: Vitamin D deficiency -  Plan: VITAMIN D 25 Hydroxy (Vit-D Deficiency, Fractures)  Type 2 diabetes mellitus without complication, with long-term current use of insulin (HCC) - Plan: Hemoglobin A1c, Insulin, random  Class 2 obesity with serious comorbidity and body mass index (BMI) of 35.0 to 35.9 in adult, unspecified obesity type  PLAN:  Vitamin D Deficiency Stacey Cooper was informed that low vitamin D levels contributes to fatigue and are associated with obesity, breast, and colon cancer. She agrees to continue to take prescription Vit D @50 ,000 IU every week, we will refill for 1 month and will check labs. and will follow up for routine testing of vitamin D, at least 2-3 times per year. She was informed of the risk of over-replacement of vitamin D and agrees to not increase her dose unless he discusses this with [name] first. Stacey Cooper agrees to follow up with our clinic in 3 to 4 weeks.  Diabetes II Stacey Cooper has been given extensive diabetes education by myself today including ideal fasting and post-prandial blood glucose readings, individual ideal Hgb A1c goals  and hypoglycemia prevention. We discussed the importance of good blood sugar control to decrease the likelihood of diabetic complications such as nephropathy, neuropathy, limb loss, blindness, coronary artery disease, and death. We discussed the importance of intensive lifestyle modification including diet, exercise and weight loss as the first line treatment for diabetes. We will check labs and Stacey Cooper agrees to continue victoza 0.6 mg and follow up at the agreed upon time.  Obesity Stacey Cooper is currently in the action stage of change. As such, her goal is to continue with weight loss efforts She has agreed to follow the Category 2 plan Stacey Cooper has been instructed to work up to a goal of 150 minutes of combined cardio and strengthening exercise per week for weight loss and  overall health benefits. We discussed the following Behavioral Modification Strategies today: increasing lean  protein intake and travel eating strategies   Stacey Cooper has agreed to follow up with our clinic in 3 to 4 weeks. She was informed of the importance of frequent follow up visits to maximize her success with intensive lifestyle modifications for her multiple health conditions.  I, Doreene Nest, am acting as transcriptionist for Lacy Duverney, PA-C  I have reviewed the above documentation for accuracy and completeness, and I agree with the above. -Lacy Duverney, PA-C  I have reviewed the above note and agree with the plan. -Dennard Nip, MD  OBESITY BEHAVIORAL INTERVENTION VISIT  Today's visit was # 5 out of 22.  Starting weight: 223 lbs Starting date: 05/15/17 Today's weight : 192 lbs Today's date: 08/10/2017 Total lbs lost to date: 70 (Patients must lose 7 lbs in the first 6 months to continue with counseling)   ASK: We discussed the diagnosis of obesity with Stacey Cooper today and Stacey Cooper agreed to give [name] permission to discuss obesity behavioral modification therapy today.  ASSESS: Stacey Cooper has the diagnosis of obesity and her BMI today is 35.2 Stacey Cooper is in the action stage of change   ADVISE: Stacey Cooper was educated on the multiple health risks of obesity as well as the benefit of weight loss to improve her health. She was advised of the need for long term treatment and the importance of lifestyle modifications.  AGREE: Multiple dietary modification options and treatment options were discussed and  Stacey Cooper agreed to follow the Category 2 plan We discussed the following Behavioral Modification Strategies today: increasing lean protein intake and travel eating strategies

## 2017-08-15 ENCOUNTER — Other Ambulatory Visit (INDEPENDENT_AMBULATORY_CARE_PROVIDER_SITE_OTHER): Payer: Self-pay

## 2017-08-15 ENCOUNTER — Encounter (INDEPENDENT_AMBULATORY_CARE_PROVIDER_SITE_OTHER): Payer: Self-pay

## 2017-08-15 ENCOUNTER — Telehealth (INDEPENDENT_AMBULATORY_CARE_PROVIDER_SITE_OTHER): Payer: Self-pay | Admitting: Family Medicine

## 2017-08-15 DIAGNOSIS — E559 Vitamin D deficiency, unspecified: Secondary | ICD-10-CM

## 2017-08-15 MED ORDER — VITAMIN D (ERGOCALCIFEROL) 1.25 MG (50000 UNIT) PO CAPS: 50000.0000 [IU] | ORAL_CAPSULE | ORAL | 0 refills | Status: DC

## 2017-08-15 NOTE — Telephone Encounter (Signed)
Pt states vitamin d has not been called into pharmacy.  Pharmacy is [facility] in [place] [address] number is [phone]

## 2017-08-15 NOTE — Telephone Encounter (Signed)
Thank [name],  I sent her refill through and I sent her a message through [place] letting her know it has been sent.   Thanks Again!

## 2017-09-06 ENCOUNTER — Ambulatory Visit (INDEPENDENT_AMBULATORY_CARE_PROVIDER_SITE_OTHER): Payer: Medicare Other | Admitting: Physician Assistant

## 2017-09-06 VITALS — BP 111/70 | HR 83 | Temp 98.2°F | Ht 62.0 in | Wt 185.0 lb

## 2017-09-06 DIAGNOSIS — E559 Vitamin D deficiency, unspecified: Secondary | ICD-10-CM

## 2017-09-06 DIAGNOSIS — E669 Obesity, unspecified: Secondary | ICD-10-CM | POA: Diagnosis not present

## 2017-09-06 DIAGNOSIS — Z6833 Body mass index (BMI) 33.0-33.9, adult: Secondary | ICD-10-CM

## 2017-09-06 DIAGNOSIS — E119 Type 2 diabetes mellitus without complications: Secondary | ICD-10-CM | POA: Diagnosis not present

## 2017-09-06 MED ORDER — VITAMIN D (ERGOCALCIFEROL) 1.25 MG (50000 UNIT) PO CAPS: 50000.0000 [IU] | ORAL_CAPSULE | ORAL | 0 refills | Status: DC

## 2017-09-06 NOTE — Progress Notes (Signed)
Office: [phone]  /  Fax: [phone]   HPI:   Chief Complaint: OBESITY Stacey Cooper is here to discuss her progress with her obesity treatment plan. She is on the Category 2 plan and is following her eating plan approximately 100 % of the time. She states she is exercising 0 minutes 0 times per week. Stacey Cooper was on vacation at the beach but she maintained with following the meal plan and walked more. She states that she would like more breakfast options. Her weight is 185 lb (83.9 kg) today and has had a weight loss of 7 pounds over a period of 4 weeks since her last visit. She has lost 38 lbs since starting treatment with [name].  Vitamin D deficiency Stacey Cooper has a diagnosis of vitamin D deficiency. She is currently taking prescription Vit D and denies nausea, vomiting or muscle weakness.  Diabetes II Stacey Cooper has a diagnosis of diabetes type II. Stacey Cooper's fasting BGs range in 100's and she denies any hypoglycemic episodes. Last A1c was 6.8 on 05/15/17. She has been working on intensive lifestyle modifications including diet, exercise, and weight loss to help control her blood glucose levels.  ALLERGIES: Allergies  Allergen Reactions  . Contrast Media [Iodinated Diagnostic Agents] Shortness Of Breath  . Lovastatin     myalgias    MEDICATIONS: Current Outpatient Prescriptions on File Prior to Visit  Medication Sig Dispense Refill  . fluticasone (FLONASE) 50 MCG/ACT nasal spray Place 2 sprays into both nostrils daily. 48 g 0  . furosemide (LASIX) 20 MG tablet TAKE 1 TABLET(20 MG) BY MOUTH DAILY AS NEEDED FOR SWELLING 90 tablet 0  . glucose blood (FREESTYLE LITE) test strip USE TO TEST BLOOD SUGAR TWICE DAILY AS DIRECTED 100 each 6  . Insulin Pen Needle (NOVOFINE) 32G X 6 MM MISC Use with Victoza 100 each 6  . Lancets MISC by Does not apply route. Check blood sugars qam and once daily prn     . lidocaine (XYLOCAINE) 2 % jelly Apply 1 application topically as needed. 30 mL 0  . Liraglutide  (VICTOZA) 18 MG/3ML SOPN Inject 0.3 mLs (1.8 mg total) into the skin daily. 9 mL 3  . lisinopril (PRINIVIL,ZESTRIL) 5 MG tablet TAKE 1 TABLET(5 MG) BY MOUTH DAILY 90 tablet 1  . metFORMIN (GLUCOPHAGE-XR) 500 MG 24 hr tablet TAKE 1 TABLET BY MOUTH IN THE MORNING AND 2 TABLETS EVERY NIGHT AT BEDTIME 270 tablet 1  . rosuvastatin (CRESTOR) 5 MG tablet TAKE ONE TABLET EVERY OTHER DAY. 30 tablet 1  . Vitamin D, Ergocalciferol, (DRISDOL) 50000 units CAPS capsule Take 1 capsule (50,000 Units total) by mouth every 7 (seven) days. 4 capsule 0   No current facility-administered medications on file prior to visit.     PAST MEDICAL HISTORY: Past Medical History:  Diagnosis Date  . ABDOMINAL PAIN, RIGHT UPPER QUADRANT, HX OF 01/24/2011  . Actinic keratoses 07/18/2011  . Arthralgia 05/03/2015  . Back pain 12/08/2011  . Basal cell carcinoma of skin of lip 01/24/2011  . Blood transfusion without reported diagnosis   . Bronchitis 12/20/2012  . Cataract    bilataeral cateracts removed  . CHOLELITHIASIS, HX OF 02/07/2011  . Chronic kidney disease   . Diabetes mellitus   . Diabetes mellitus type 2 in obese [facility]) 01/24/2011   Qualifier: Diagnosis of  By: [name], [name]  Annual eye exam with  [facility] on 06/14/13   . DM 01/24/2011  . Essential hypertension, benign 01/24/2011  . Fatty liver   .  GERD 01/24/2011  . Grief reaction 01/20/2014  . HAIR LOSS 01/24/2011  . Hearing loss of both ears 06/28/2013  . Hepatic cirrhosis ([place]) 04/27/2015  . Hernia, abdominal   . Hyperlipidemia 04/24/2012  . Hyperlipidemia, mixed 04/24/2012  . Insomnia 07/24/2012  . Joint pain   . Leg cramps 06/06/2013  . MEASLES, HX OF 01/24/2011  . Morbid obesity ([place]) 01/24/2011  . NONSPEC ELEVATION OF LEVELS OF TRANSAMINASE/LDH 02/07/2011  . Peripheral neuropathy 04/20/2011  . Preventative health care 08/23/2016  . Right hip pain 05/03/2015  . SCC (squamous cell carcinoma), face 07/18/2011  . SQUAMOUS CELL CARCINOMA OTHER SPEC SITES SKIN  01/24/2011  . Swelling   . Tubular adenoma of colon 04/2015  . Umbilical hernia 08/05/9146  . URINARY INCONTINENCE 02/07/2011  . Urinary tract infection, site not specified 04/30/2014  . UTI'S, HX OF 01/24/2011  . Vulvar atrophy 03/17/2017    PAST SURGICAL HISTORY: Past Surgical History:  Procedure Laterality Date  . ABDOMINAL HYSTERECTOMY     partial, both ovaries left in place  . BCC removed below lower lip on left  26 yrs ago  . cataract extraction, b/l    . COLONOSCOPY    . Left knee arthroscopy and cleaning prior to TKR    . skin bx, right anterior CW , squamous cancer, had to redo to margins  12/2010  . total knee replacement, b/l      SOCIAL HISTORY: Social History  Substance Use Topics  . Smoking status: Never Smoker  . Smokeless tobacco: Never Used  . Alcohol use No    FAMILY HISTORY: Family History  Problem Relation Age of Onset  . Arthritis Mother        RA  . Myelodysplastic syndrome Mother   . Heart disease Mother   . Cancer Father        skin  . Stroke Father   . Colon polyps Father        48 in [place] intes removed carcinoid tumor confined in the tumor  . Stroke Maternal Grandmother   . Kidney failure Maternal Grandfather   . Esophageal cancer Neg Hx   . Rectal cancer Neg Hx   . Stomach cancer Neg Hx     ROS: Review of Systems  Constitutional: Positive for weight loss.  Gastrointestinal: Negative for nausea and vomiting.  Musculoskeletal:       Negative muscle weakness  Endo/Heme/Allergies:       Negative hypoglycemia    PHYSICAL EXAM: Blood pressure 111/70, pulse 83, temperature 98.2 F (36.8 C), temperature source Oral, height 5\' 2"  (1.575 m), weight 185 lb (83.9 kg), SpO2 96 %. Body mass index is 33.84 kg/m. Physical Exam  Constitutional: She is oriented to person, place, and time. She appears well-developed and well-nourished.  Cardiovascular: Normal rate.   Pulmonary/Chest: Effort normal.  Musculoskeletal: Normal range of motion.    Neurological: She is oriented to person, place, and time.  Skin: Skin is warm and dry.  Psychiatric: She has a normal mood and affect. Her behavior is normal.  Vitals reviewed.   RECENT LABS AND TESTS: BMET    Component Value Date/Time   NA 141 02/15/2017 0712   K 4.1 02/15/2017 0712   CL 108 02/15/2017 0712   CO2 28 02/15/2017 0712   GLUCOSE 122 (H) 02/15/2017 0712   BUN 15 02/15/2017 0712   CREATININE 0.83 02/15/2017 0712   CREATININE 0.73 07/27/2015 0751   CALCIUM 9.2 02/15/2017 0712   GFRNONAA 80 07/27/2015 0751  GFRAA >89 07/27/2015 0751   Lab Results  Component Value Date   HGBA1C 6.8 (H) 05/15/2017   HGBA1C 6.8 (H) 02/15/2017   HGBA1C 6.3 08/23/2016   HGBA1C 6.6 (H) 05/11/2016   HGBA1C 6.5 02/12/2016   Lab Results  Component Value Date   INSULIN 16.4 05/15/2017   CBC    Component Value Date/Time   WBC 7.3 02/15/2017 0712   RBC 4.07 02/15/2017 0712   HGB 13.0 02/15/2017 0712   HCT 38.3 02/15/2017 0712   PLT 186.0 02/15/2017 0712   MCV 94.0 02/15/2017 0712   MCH 31.9 04/03/2015 1443   MCHC 33.9 02/15/2017 0712   RDW 13.1 02/15/2017 0712   LYMPHSABS 1.4 05/07/2015 1159   MONOABS 0.8 05/07/2015 1159   EOSABS 0.1 05/07/2015 1159   BASOSABS 0.1 05/07/2015 1159   Iron/TIBC/Ferritin/ %Sat    Component Value Date/Time   IRON 101 01/24/2011 2029   FERRITIN 171 01/24/2011 2029   Lipid Panel     Component Value Date/Time   CHOL 176 05/15/2017 1001   TRIG 141 05/15/2017 1001   HDL 48 05/15/2017 1001   CHOLHDL 4 02/15/2017 0712   VLDL 20.6 02/15/2017 0712   LDLCALC 100 (H) 05/15/2017 1001   Hepatic Function Panel     Component Value Date/Time   PROT 6.7 02/15/2017 0712   ALBUMIN 3.6 02/15/2017 0712   AST 36 02/15/2017 0712   ALT 21 02/15/2017 0712   ALKPHOS 109 02/15/2017 0712   BILITOT 0.8 02/15/2017 0712   BILIDIR 0.3 01/19/2015 0751   IBILI 0.9 06/24/2014 0847      Component Value Date/Time   TSH 3.10 02/15/2017 0712   TSH 3.77  08/23/2016 0816   TSH 2.83 02/12/2016 0820    ASSESSMENT AND PLAN: Vitamin D deficiency - Plan: Vitamin D, Ergocalciferol, (DRISDOL) 50000 units CAPS capsule  Type 2 diabetes mellitus without complication, without long-term current use of insulin (HCC)  Class 1 obesity with serious comorbidity and body mass index (BMI) of 33.0 to 33.9 in adult, unspecified obesity type  PLAN:  Vitamin D Deficiency Adelita was informed that low vitamin D levels contributes to fatigue and are associated with obesity, breast, and colon cancer. She agrees to continue taking prescription Vit D @50 ,000 IU every week #4 and we will refill for 1 month. She will follow up for routine testing of vitamin D, at least 2-3 times per year. She was informed of the risk of over-replacement of vitamin D and agrees to not increase her dose unless he discusses this with [name] first. Edward agrees to follow up with our clinic in 2 to 3 weeks.  Diabetes II Jaclene has been given extensive diabetes education by myself today including ideal fasting and post-prandial blood glucose readings, individual ideal Hgb A1c goals and hypoglycemia prevention. We discussed the importance of good blood sugar control to decrease the likelihood of diabetic complications such as nephropathy, neuropathy, limb loss, blindness, coronary artery disease, and death. We discussed the importance of intensive lifestyle modification including diet, exercise and weight loss as the first line treatment for diabetes. Curley agrees to continue her diabetes medications and she will follow up with our clinic in 2 to 3 weeks.  Obesity Faiza is currently in the action stage of change. As such, her goal is to continue with weight loss efforts She has agreed to follow the Category 2 plan Favour has been instructed to work up to a goal of 150 minutes of combined cardio and strengthening exercise per  week for weight loss and overall health benefits. We discussed the following  Behavioral Modification Strategies today: increasing lean protein intake   Genni has agreed to follow up with our clinic in 2 to 3 weeks. She was informed of the importance of frequent follow up visits to maximize her success with intensive lifestyle modifications for her multiple health conditions.  I, Trixie Dredge, am acting as transcriptionist for Lacy Duverney, PA-C  I have reviewed the above documentation for accuracy and completeness, and I agree with the above. -Lacy Duverney, PA-C  I have reviewed the above note and agree with the plan. -Dennard Nip, MD   OBESITY BEHAVIORAL INTERVENTION VISIT  Today's visit was # 6 out of 22.  Starting weight: 223 lbs Starting date: 05/15/17 Today's weight: 185 lbs Today's date: 09/06/17 Total lbs lost to date: 65 (Patients must lose 7 lbs in the first 6 months to continue with counseling)   ASK: We discussed the diagnosis of obesity with Marquita Palms today and Lowen agreed to give [name] permission to discuss obesity behavioral modification therapy today.  ASSESS: Brittany has the diagnosis of obesity and her BMI today is 59 Nica is in the action stage of change   ADVISE: Tikisha was educated on the multiple health risks of obesity as well as the benefit of weight loss to improve her health. She was advised of the need for long term treatment and the importance of lifestyle modifications.  AGREE: Multiple dietary modification options and treatment options were discussed and  Anairis agreed to follow the Category 2 plan We discussed the following Behavioral Modification Strategies today: increasing lean protein intake

## 2017-09-11 ENCOUNTER — Other Ambulatory Visit (INDEPENDENT_AMBULATORY_CARE_PROVIDER_SITE_OTHER): Payer: Medicare Other

## 2017-09-11 DIAGNOSIS — E782 Mixed hyperlipidemia: Secondary | ICD-10-CM

## 2017-09-11 DIAGNOSIS — I10 Essential (primary) hypertension: Secondary | ICD-10-CM

## 2017-09-11 LAB — COMPREHENSIVE METABOLIC PANEL
ALBUMIN: 3.6 g/dL (ref 3.5–5.2)
ALT: 15 U/L (ref 0–35)
AST: 25 U/L (ref 0–37)
Alkaline Phosphatase: 94 U/L (ref 39–117)
BUN: 15 mg/dL (ref 6–23)
CALCIUM: 9.6 mg/dL (ref 8.4–10.5)
CHLORIDE: 105 meq/L (ref 96–112)
CO2: 25 mEq/L (ref 19–32)
Creatinine, Ser: 0.75 mg/dL (ref 0.40–1.20)
GFR: 79.02 mL/min (ref >60.00)
Glucose, Bld: 110 mg/dL — ABNORMAL HIGH (ref 70–99)
POTASSIUM: 3.6 meq/L (ref 3.5–5.1)
SODIUM: 140 meq/L (ref 135–145)
Total Bilirubin: 0.6 mg/dL (ref 0.2–1.2)
Total Protein: 6.6 g/dL (ref 6.0–8.3)

## 2017-09-11 LAB — TSH: TSH: 3.19 u[IU]/mL (ref 0.35–4.50)

## 2017-09-11 LAB — LIPID PANEL
CHOLESTEROL: 152 mg/dL (ref 0–200)
HDL: 42.2 mg/dL (ref >39.00)
LDL CALC: 82 mg/dL (ref 0–99)
NonHDL: 109.69
Total CHOL/HDL Ratio: 4
Triglycerides: 137 mg/dL (ref 0.0–149.0)
VLDL: 27.4 mg/dL (ref 0.0–40.0)

## 2017-09-11 LAB — CBC
HEMATOCRIT: 39.5 % (ref 36.0–46.0)
Hemoglobin: 13.2 g/dL (ref 12.0–15.0)
MCHC: 33.5 g/dL (ref 30.0–36.0)
MCV: 94.6 fl (ref 78.0–100.0)
PLATELETS: 168 10*3/uL (ref 150.0–400.0)
RBC: 4.18 Mil/uL (ref 3.87–5.11)
RDW: 13.2 % (ref 11.5–15.5)
WBC: 7.6 10*3/uL (ref 4.0–10.5)

## 2017-09-18 ENCOUNTER — Ambulatory Visit (INDEPENDENT_AMBULATORY_CARE_PROVIDER_SITE_OTHER): Payer: Medicare Other | Admitting: Family Medicine

## 2017-09-18 ENCOUNTER — Encounter: Payer: Self-pay | Admitting: Family Medicine

## 2017-09-18 ENCOUNTER — Telehealth: Payer: Self-pay | Admitting: Family Medicine

## 2017-09-18 ENCOUNTER — Ambulatory Visit: Payer: Medicare Other | Admitting: Internal Medicine

## 2017-09-18 VITALS — BP 110/58 | HR 82 | Temp 97.8°F | Resp 18 | Wt 189.2 lb

## 2017-09-18 DIAGNOSIS — E119 Type 2 diabetes mellitus without complications: Secondary | ICD-10-CM | POA: Diagnosis not present

## 2017-09-18 DIAGNOSIS — E669 Obesity, unspecified: Secondary | ICD-10-CM

## 2017-09-18 DIAGNOSIS — Z794 Long term (current) use of insulin: Secondary | ICD-10-CM | POA: Diagnosis not present

## 2017-09-18 DIAGNOSIS — I10 Essential (primary) hypertension: Secondary | ICD-10-CM | POA: Diagnosis not present

## 2017-09-18 DIAGNOSIS — IMO0001 Reserved for inherently not codable concepts without codable children: Secondary | ICD-10-CM

## 2017-09-18 DIAGNOSIS — Z6835 Body mass index (BMI) 35.0-35.9, adult: Secondary | ICD-10-CM

## 2017-09-18 DIAGNOSIS — E782 Mixed hyperlipidemia: Secondary | ICD-10-CM | POA: Diagnosis not present

## 2017-09-18 DIAGNOSIS — Z0000 Encounter for general adult medical examination without abnormal findings: Secondary | ICD-10-CM

## 2017-09-18 DIAGNOSIS — E559 Vitamin D deficiency, unspecified: Secondary | ICD-10-CM | POA: Diagnosis not present

## 2017-09-18 LAB — VITAMIN D 25 HYDROXY (VIT D DEFICIENCY, FRACTURES): VITD: 71.99 ng/mL (ref 30.00–100.00)

## 2017-09-18 LAB — HEMOGLOBIN A1C: Hgb A1c MFr Bld: 5.8 % (ref 4.6–6.5)

## 2017-09-18 MED ORDER — METFORMIN HCL 500 MG PO TABS: ORAL_TABLET | ORAL | 1 refills | Status: DC

## 2017-09-18 MED ORDER — GLUCOSE BLOOD VI STRP: ORAL_STRIP | 6 refills | Status: DC

## 2017-09-18 MED ORDER — METFORMIN HCL ER 500 MG PO TB24: ORAL_TABLET | ORAL | 1 refills | Status: DC

## 2017-09-18 MED ORDER — ROSUVASTATIN CALCIUM 5 MG PO TABS: ORAL_TABLET | ORAL | 1 refills | Status: DC

## 2017-09-18 MED ORDER — INSULIN PEN NEEDLE 32G X 6 MM MISC: 6 refills | Status: DC

## 2017-09-18 MED ORDER — LISINOPRIL 5 MG PO TABS: ORAL_TABLET | ORAL | 1 refills | Status: DC

## 2017-09-18 MED ORDER — FUROSEMIDE 20 MG PO TABS: 20.0000 mg | ORAL_TABLET | Freq: Every day | ORAL | 0 refills | Status: DC

## 2017-09-18 MED ORDER — LIRAGLUTIDE 18 MG/3ML ~~LOC~~ SOPN: 1.8000 mg | PEN_INJECTOR | Freq: Every day | SUBCUTANEOUS | 3 refills | Status: DC

## 2017-09-18 MED ORDER — VITAMIN D (ERGOCALCIFEROL) 1.25 MG (50000 UNIT) PO CAPS: 50000.0000 [IU] | ORAL_CAPSULE | ORAL | 0 refills | Status: DC

## 2017-09-18 NOTE — Assessment & Plan Note (Signed)
Is taking 50000 IU weekly, check level today

## 2017-09-18 NOTE — Patient Instructions (Signed)
Consider Biotin, Fish oil caps and Zinc roughly 50 mg daily for the hair Carbohydrate Counting for Diabetes Mellitus, Adult Carbohydrate counting is a method for keeping track of how many carbohydrates you eat. Eating carbohydrates naturally increases the amount of sugar (glucose) in the blood. Counting how many carbohydrates you eat helps keep your blood glucose within normal limits, which helps you manage your diabetes (diabetes mellitus). It is important to know how many carbohydrates you can safely have in each meal. This is different for every person. A diet and nutrition specialist (registered dietitian) can help you make a meal plan and calculate how many carbohydrates you should have at each meal and snack. Carbohydrates are found in the following foods:  Grains, such as breads and cereals.  Dried beans and soy products.  Starchy vegetables, such as potatoes, peas, and corn.  Fruit and fruit juices.  Milk and yogurt.  Sweets and snack foods, such as cake, cookies, candy, chips, and soft drinks.  How do I count carbohydrates? There are two ways to count carbohydrates in food. You can use either of the methods or a combination of both. Reading "Nutrition Facts" on packaged food The "Nutrition Facts" list is included on the labels of almost all packaged foods and beverages in the [place] It includes:  The serving size.  Information about nutrients in each serving, including the grams (g) of carbohydrate per serving.  To use the "Nutrition Facts":  Decide how many servings you will have.  Multiply the number of servings by the number of carbohydrates per serving.  The resulting number is the total amount of carbohydrates that you will be having.  Learning standard serving sizes of other foods When you eat foods containing carbohydrates that are not packaged or do not include "Nutrition Facts" on the label, you need to measure the servings in order to count the amount of  carbohydrates:  Measure the foods that you will eat with a food scale or measuring cup, if needed.  Decide how many standard-size servings you will eat.  Multiply the number of servings by 15. Most carbohydrate-rich foods have about 15 g of carbohydrates per serving. ? For example, if you eat 8 oz (170 g) of strawberries, you will have eaten 2 servings and 30 g of carbohydrates (2 servings x 15 g = 30 g).  For foods that have more than one food mixed, such as soups and casseroles, you must count the carbohydrates in each food that is included.  The following list contains standard serving sizes of common carbohydrate-rich foods. Each of these servings has about 15 g of carbohydrates:   hamburger bun or  English muffin.   oz (15 mL) syrup.   oz (14 g) jelly.  1 slice of bread.  1 six-inch tortilla.  3 oz (85 g) cooked rice or pasta.  4 oz (113 g) cooked dried beans.  4 oz (113 g) starchy vegetable, such as peas, corn, or potatoes.  4 oz (113 g) hot cereal.  4 oz (113 g) mashed potatoes or  of a large baked potato.  4 oz (113 g) canned or frozen fruit.  4 oz (120 mL) fruit juice.  4-6 crackers.  6 chicken nuggets.  6 oz (170 g) unsweetened dry cereal.  6 oz (170 g) plain fat-free yogurt or yogurt sweetened with artificial sweeteners.  8 oz (240 mL) milk.  8 oz (170 g) fresh fruit or one small piece of fruit.  24 oz (680 g) popped popcorn.  Example of carbohydrate counting Sample meal  3 oz (85 g) chicken breast.  6 oz (170 g) brown rice.  4 oz (113 g) corn.  8 oz (240 mL) milk.  8 oz (170 g) strawberries with sugar-free whipped topping. Carbohydrate calculation 1. Identify the foods that contain carbohydrates: ? Rice. ? Corn. ? Milk. ? Strawberries. 2. Calculate how many servings you have of each food: ? 2 servings rice. ? 1 serving corn. ? 1 serving milk. ? 1 serving strawberries. 3. Multiply each number of servings by 15 g: ? 2 servings  rice x 15 g = 30 g. ? 1 serving corn x 15 g = 15 g. ? 1 serving milk x 15 g = 15 g. ? 1 serving strawberries x 15 g = 15 g. 4. Add together all of the amounts to find the total grams of carbohydrates eaten: ? 30 g + 15 g + 15 g + 15 g = 75 g of carbohydrates total. This information is not intended to replace advice given to you by your health care provider. Make sure you discuss any questions you have with your health care provider. Document Released: 12/12/2005 Document Revised: 07/01/2016 Document Reviewed: 05/25/2016 Elsevier Interactive Patient Education  2018 [employer] Inc.  

## 2017-09-18 NOTE — Assessment & Plan Note (Signed)
Is doing well with bariatric program. Will continue with Dr Leafy Ro

## 2017-09-18 NOTE — Assessment & Plan Note (Signed)
Well controlled, no changes to meds. Encouraged heart healthy diet such as the DASH diet and exercise as tolerated.  °

## 2017-09-18 NOTE — Assessment & Plan Note (Signed)
Patient encouraged to maintain heart healthy diet, regular exercise, adequate sleep. Consider daily probiotics. Take medications as prescribed 

## 2017-09-18 NOTE — Progress Notes (Signed)
Subjective:  I acted as a Education administrator for Dr. Charlett Blake. [place], [place]  Patient ID: Stacey Cooper, female    DOB: 30-Sep-1935, 82 y.o.   MRN: [id number]  No chief complaint on file.   HPI  Patient is in today for an annual exam And follow-up on chronic medical problems including diabetes, hypertension, obesity and hyperlipidemia. She reports her blood sugar this morning was 105. She feels well. No recent febrile illness or acute hospitalization. She denies polyuria or polydipsia. She is pleased with the bariatric program and is maintaining a heart healthy diet. Is doing well with activities of daily living at home and offers no acute concerns. Denies CP/palp/SOB/HA/congestion/fevers/GI or GU c/o. Taking meds as prescribed  Patient Care Team: Mosie Lukes, MD as PCP - General (Family Medicine) Monna Fam, MD as Consulting Physician (Ophthalmology) Melrose Nakayama, MD as Consulting Physician (Orthopedic Surgery)   Past Medical History:  Diagnosis Date  . ABDOMINAL PAIN, RIGHT UPPER QUADRANT, HX OF 01/24/2011  . Actinic keratoses 07/18/2011  . Arthralgia 05/03/2015  . Back pain 12/08/2011  . Basal cell carcinoma of skin of lip 01/24/2011  . Blood transfusion without reported diagnosis   . Bronchitis 12/20/2012  . Cataract    bilataeral cateracts removed  . CHOLELITHIASIS, HX OF 02/07/2011  . Chronic kidney disease   . Diabetes mellitus   . Diabetes mellitus type 2 in obese [facility]) 01/24/2011   Qualifier: Diagnosis of  By: [name], [name]  Annual eye exam with [facility] on 06/14/13   . DM 01/24/2011  . Essential hypertension, benign 01/24/2011  . Fatty liver   . GERD 01/24/2011  . Grief reaction 01/20/2014  . HAIR LOSS 01/24/2011  . Hearing loss of both ears 06/28/2013  . Hepatic cirrhosis ([place]) 04/27/2015  . Hernia, abdominal   . Hyperlipidemia 04/24/2012  . Hyperlipidemia, mixed 04/24/2012  . Insomnia 07/24/2012  . Joint pain   . Leg cramps 06/06/2013  . MEASLES, HX OF 01/24/2011  .  Morbid obesity ([place]) 01/24/2011  . NONSPEC ELEVATION OF LEVELS OF TRANSAMINASE/LDH 02/07/2011  . Peripheral neuropathy 04/20/2011  . Preventative health care 08/23/2016  . Right hip pain 05/03/2015  . SCC (squamous cell carcinoma), face 07/18/2011  . SQUAMOUS CELL CARCINOMA OTHER SPEC SITES SKIN 01/24/2011  . Swelling   . Tubular adenoma of colon 04/2015  . Umbilical hernia 4/43/1540  . URINARY INCONTINENCE 02/07/2011  . Urinary tract infection, site not specified 04/30/2014  . UTI'S, HX OF 01/24/2011  . Vulvar atrophy 03/17/2017    Past Surgical History:  Procedure Laterality Date  . ABDOMINAL HYSTERECTOMY     partial, both ovaries left in place  . BCC removed below lower lip on left  26 yrs ago  . cataract extraction, b/l    . COLONOSCOPY    . Left knee arthroscopy and cleaning prior to TKR    . skin bx, right anterior CW , squamous cancer, had to redo to margins  12/2010  . total knee replacement, b/l      Family History  Problem Relation Age of Onset  . Arthritis Mother        RA  . Myelodysplastic syndrome Mother   . Heart disease Mother   . Cancer Father        skin  . Stroke Father   . Colon polyps Father        40 in [place] intes removed carcinoid tumor confined in the tumor  . Stroke Maternal Grandmother   .  Kidney failure Maternal Grandfather   . Esophageal cancer Neg Hx   . Rectal cancer Neg Hx   . Stomach cancer Neg Hx     Social History   Social History  . Marital status: Married    Spouse name: N/A  . Number of children: N/A  . Years of education: N/A   Occupational History  . retired    Social History Main Topics  . Smoking status: Never Smoker  . Smokeless tobacco: Never Used  . Alcohol use No  . Drug use: No  . Sexual activity: Yes   Other Topics Concern  . Not on file   Social History Narrative  . No narrative on file    Outpatient Medications Prior to Visit  Medication Sig Dispense Refill  . Lancets MISC by Does not apply route. Check  blood sugars qam and once daily prn     . fluticasone (FLONASE) 50 MCG/ACT nasal spray Place 2 sprays into both nostrils daily. 48 g 0  . furosemide (LASIX) 20 MG tablet TAKE 1 TABLET(20 MG) BY MOUTH DAILY AS NEEDED FOR SWELLING 90 tablet 0  . glucose blood (FREESTYLE LITE) test strip USE TO TEST BLOOD SUGAR TWICE DAILY AS DIRECTED 100 each 6  . Insulin Pen Needle (NOVOFINE) 32G X 6 MM MISC Use with Victoza 100 each 6  . lidocaine (XYLOCAINE) 2 % jelly Apply 1 application topically as needed. 30 mL 0  . Liraglutide (VICTOZA) 18 MG/3ML SOPN Inject 0.3 mLs (1.8 mg total) into the skin daily. 9 mL 3  . lisinopril (PRINIVIL,ZESTRIL) 5 MG tablet TAKE 1 TABLET(5 MG) BY MOUTH DAILY 90 tablet 1  . metFORMIN (GLUCOPHAGE-XR) 500 MG 24 hr tablet TAKE 1 TABLET BY MOUTH IN THE MORNING AND 2 TABLETS EVERY NIGHT AT BEDTIME 270 tablet 1  . rosuvastatin (CRESTOR) 5 MG tablet TAKE ONE TABLET EVERY OTHER DAY. 30 tablet 1  . Vitamin D, Ergocalciferol, (DRISDOL) 50000 units CAPS capsule Take 1 capsule (50,000 Units total) by mouth every 7 (seven) days. 4 capsule 0   No facility-administered medications prior to visit.     Allergies  Allergen Reactions  . Contrast Media [Iodinated Diagnostic Agents] Shortness Of Breath  . Lovastatin     myalgias    Review of Systems  Constitutional: Negative for chills, fever and malaise/fatigue.  HENT: Negative for congestion and hearing loss.   Eyes: Negative for discharge.  Respiratory: Negative for cough, sputum production and shortness of breath.   Cardiovascular: Negative for chest pain, palpitations and leg swelling.  Gastrointestinal: Negative for abdominal pain, blood in stool, constipation, diarrhea, heartburn, nausea and vomiting.  Genitourinary: Negative for dysuria, frequency, hematuria and urgency.  Musculoskeletal: Negative for back pain, falls and myalgias.  Skin: Negative for rash.  Neurological: Negative for dizziness, sensory change, loss of  consciousness, weakness and headaches.  Endo/Heme/Allergies: Negative for environmental allergies. Does not bruise/bleed easily.  Psychiatric/Behavioral: Negative for depression and suicidal ideas. The patient is not nervous/anxious and does not have insomnia.        Objective:    Physical Exam  Constitutional: She is oriented to person, place, and time. She appears well-developed and well-nourished. No distress.  HENT:  Head: Normocephalic and atraumatic.  Eyes: Conjunctivae are normal.  Neck: Neck supple. No thyromegaly present.  Cardiovascular: Normal rate, regular rhythm and normal heart sounds.   No murmur heard. Pulmonary/Chest: Effort normal and breath sounds normal. No respiratory distress.  Abdominal: Soft. Bowel sounds are normal. She exhibits no distension  and no mass. There is no tenderness.  Musculoskeletal: She exhibits no edema.  Lymphadenopathy:    She has no cervical adenopathy.  Neurological: She is alert and oriented to person, place, and time.  Skin: Skin is warm and dry.  Psychiatric: She has a normal mood and affect. Her behavior is normal.    BP (!) 110/58 (BP Location: Left Arm, Patient Position: Sitting, Cuff Size: Normal)   Pulse 82   Temp 97.8 F (36.6 C) (Oral)   Resp 18   Wt 189 lb 3.2 oz (85.8 kg)   SpO2 98%   BMI 34.61 kg/m  Wt Readings from Last 3 Encounters:  09/18/17 189 lb 3.2 oz (85.8 kg)  09/06/17 185 lb (83.9 kg)  08/10/17 192 lb (87.1 kg)   BP Readings from Last 3 Encounters:  09/18/17 (!) 110/58  09/06/17 111/70  08/10/17 107/65     Immunization History  Administered Date(s) Administered  . Influenza Split 10/20/2011, 11/29/2012  . Influenza Whole 08/26/2010  . Influenza, High Dose Seasonal PF 08/23/2016  . Influenza,inj,Quad PF,6+ Mos 09/05/2013, 08/28/2014, 11/12/2015  . Pneumococcal Conjugate-13 08/28/2014  . Pneumococcal Polysaccharide-23 11/12/2015    Health Maintenance  Topic Date Due  . TETANUS/TDAP  09/07/1956    . INFLUENZA VACCINE  07/26/2017  . FOOT EXAM  08/08/2017  . HEMOGLOBIN A1C  11/15/2017  . OPHTHALMOLOGY EXAM  02/14/2018  . DEXA SCAN  Completed  . PNA vac Low Risk Adult  Completed    Lab Results  Component Value Date   WBC 7.6 09/11/2017   HGB 13.2 09/11/2017   HCT 39.5 09/11/2017   PLT 168.0 09/11/2017   GLUCOSE 110 (H) 09/11/2017   CHOL 152 09/11/2017   TRIG 137.0 09/11/2017   HDL 42.20 09/11/2017   LDLCALC 82 09/11/2017   ALT 15 09/11/2017   AST 25 09/11/2017   NA 140 09/11/2017   K 3.6 09/11/2017   CL 105 09/11/2017   CREATININE 0.75 09/11/2017   BUN 15 09/11/2017   CO2 25 09/11/2017   TSH 3.19 09/11/2017   INR 1.0 11/21/2016   HGBA1C 6.8 (H) 05/15/2017   MICROALBUR <0.7 11/12/2015    Lab Results  Component Value Date   TSH 3.19 09/11/2017   Lab Results  Component Value Date   WBC 7.6 09/11/2017   HGB 13.2 09/11/2017   HCT 39.5 09/11/2017   MCV 94.6 09/11/2017   PLT 168.0 09/11/2017   Lab Results  Component Value Date   NA 140 09/11/2017   K 3.6 09/11/2017   CO2 25 09/11/2017   GLUCOSE 110 (H) 09/11/2017   BUN 15 09/11/2017   CREATININE 0.75 09/11/2017   BILITOT 0.6 09/11/2017   ALKPHOS 94 09/11/2017   AST 25 09/11/2017   ALT 15 09/11/2017   PROT 6.6 09/11/2017   ALBUMIN 3.6 09/11/2017   CALCIUM 9.6 09/11/2017   GFR 79.02 09/11/2017   Lab Results  Component Value Date   CHOL 152 09/11/2017   Lab Results  Component Value Date   HDL 42.20 09/11/2017   Lab Results  Component Value Date   LDLCALC 82 09/11/2017   Lab Results  Component Value Date   TRIG 137.0 09/11/2017   Lab Results  Component Value Date   CHOLHDL 4 09/11/2017   Lab Results  Component Value Date   HGBA1C 6.8 (H) 05/15/2017         Assessment & Plan:   Problem List Items Addressed This Visit    Type 2 diabetes mellitus without  complication, with long-term current use of insulin ([place]) - Primary    hgba1c acceptable, minimize simple carbs. Increase exercise  as tolerated. Continue current meds. Is passing through intact so will try and switch to the short acting Metformin to see if that helps and monitor sugars      Relevant Medications   lisinopril (PRINIVIL,ZESTRIL) 5 MG tablet   liraglutide (VICTOZA) 18 MG/3ML SOPN   rosuvastatin (CRESTOR) 5 MG tablet   metFORMIN (GLUCOPHAGE) 500 MG tablet   Other Relevant Orders   Hemoglobin A1c   Class 2 obesity with serious comorbidity and body mass index (BMI) of 35.0 to 35.9 in adult    Is doing well with bariatric program. Will continue with Dr Leafy Ro      Relevant Medications   liraglutide (VICTOZA) 18 MG/3ML SOPN   metFORMIN (GLUCOPHAGE) 500 MG tablet   ESSENTIAL HYPERTENSION, BENIGN    Well controlled, no changes to meds. Encouraged heart healthy diet such as the DASH diet and exercise as tolerated.       Relevant Medications   furosemide (LASIX) 20 MG tablet   lisinopril (PRINIVIL,ZESTRIL) 5 MG tablet   rosuvastatin (CRESTOR) 5 MG tablet   Hyperlipidemia, mixed    Tolerating statin, encouraged heart healthy diet, avoid trans fats, minimize simple carbs and saturated fats. Increase exercise as tolerated      Relevant Medications   furosemide (LASIX) 20 MG tablet   lisinopril (PRINIVIL,ZESTRIL) 5 MG tablet   rosuvastatin (CRESTOR) 5 MG tablet   Preventative health care    Patient encouraged to maintain heart healthy diet, regular exercise, adequate sleep. Consider daily probiotics. Take medications as prescribed      Vitamin D deficiency    Is taking 50000 IU weekly, check level today      Relevant Medications   Vitamin D, Ergocalciferol, (DRISDOL) 50000 units CAPS capsule   Other Relevant Orders   VITAMIN D 25 Hydroxy (Vit-D Deficiency, Fractures)      I have discontinued Ms. Romack's lidocaine, fluticasone, metFORMIN, and metFORMIN. I have also changed her furosemide. Additionally, I am having her start on metFORMIN. Lastly, I am having her maintain her Lancets, lisinopril,  Insulin Pen Needle, Vitamin D (Ergocalciferol), liraglutide, rosuvastatin, and glucose blood.  Meds ordered this encounter  Medications  . furosemide (LASIX) 20 MG tablet    Sig: Take 1 tablet (20 mg total) by mouth daily.    Dispense:  90 tablet    Refill:  0    **Patient requests 90 days supply**  . lisinopril (PRINIVIL,ZESTRIL) 5 MG tablet    Sig: TAKE 1 TABLET(5 MG) BY MOUTH DAILY    Dispense:  90 tablet    Refill:  1  . DISCONTD: metFORMIN (GLUCOPHAGE-XR) 500 MG 24 hr tablet    Sig: TAKE 1 TABLET BY MOUTH IN THE MORNING AND 2 TABLETS EVERY NIGHT AT BEDTIME    Dispense:  270 tablet    Refill:  1  . Insulin Pen Needle (NOVOFINE) 32G X 6 MM MISC    Sig: Use with Victoza    Dispense:  100 each    Refill:  6  . Vitamin D, Ergocalciferol, (DRISDOL) 50000 units CAPS capsule    Sig: Take 1 capsule (50,000 Units total) by mouth every 7 (seven) days.    Dispense:  4 capsule    Refill:  0  . liraglutide (VICTOZA) 18 MG/3ML SOPN    Sig: Inject 0.3 mLs (1.8 mg total) into the skin daily.    Dispense:  9  mL    Refill:  3  . rosuvastatin (CRESTOR) 5 MG tablet    Sig: TAKE ONE TABLET EVERY OTHER DAY.    Dispense:  30 tablet    Refill:  1  . glucose blood (FREESTYLE LITE) test strip    Sig: USE TO TEST BLOOD SUGAR TWICE DAILY AS DIRECTED    Dispense:  100 each    Refill:  6  . metFORMIN (GLUCOPHAGE) 500 MG tablet    Sig: 1 tab po in am and 2 tabs po q pm    Dispense:  270 tablet    Refill:  1    CMA served as Education administrator during this visit. History, Physical and Plan performed by medical provider. Documentation and orders reviewed and attested to.  Penni Homans, MD

## 2017-09-18 NOTE — Assessment & Plan Note (Addendum)
hgba1c acceptable, minimize simple carbs. Increase exercise as tolerated. Continue current meds. Is passing through intact so will try and switch to the short acting Metformin to see if that helps and monitor sugars

## 2017-09-18 NOTE — Telephone Encounter (Signed)
[place], [place] [phone] (Phone) [phone] (Fax)   Pharmacy in need of clarification regarding metFORMIN (GLUCOPHAGE) 500 MG tablet  extended release or non extended release

## 2017-09-18 NOTE — Assessment & Plan Note (Signed)
Tolerating statin, encouraged heart healthy diet, avoid trans fats, minimize simple carbs and saturated fats. Increase exercise as tolerated 

## 2017-09-19 MED ORDER — METFORMIN HCL 500 MG PO TABS: ORAL_TABLET | ORAL | 1 refills | Status: DC

## 2017-09-19 NOTE — Telephone Encounter (Signed)
I tried to call pharm but no answer/refaxed Rx with note in pharm section stating (Not extended release)/thx dmf

## 2017-09-20 NOTE — Telephone Encounter (Signed)
Pharmacy called in to follow up. I advised per note.

## 2017-09-26 ENCOUNTER — Ambulatory Visit (INDEPENDENT_AMBULATORY_CARE_PROVIDER_SITE_OTHER): Payer: Medicare Other | Admitting: Physician Assistant

## 2017-09-26 VITALS — BP 101/62 | HR 89 | Temp 97.5°F | Ht 62.0 in | Wt 183.0 lb

## 2017-09-26 DIAGNOSIS — E669 Obesity, unspecified: Secondary | ICD-10-CM

## 2017-09-26 DIAGNOSIS — E11649 Type 2 diabetes mellitus with hypoglycemia without coma: Secondary | ICD-10-CM | POA: Diagnosis not present

## 2017-09-26 DIAGNOSIS — E119 Type 2 diabetes mellitus without complications: Secondary | ICD-10-CM | POA: Insufficient documentation

## 2017-09-26 DIAGNOSIS — E559 Vitamin D deficiency, unspecified: Secondary | ICD-10-CM | POA: Diagnosis not present

## 2017-09-26 DIAGNOSIS — Z6833 Body mass index (BMI) 33.0-33.9, adult: Secondary | ICD-10-CM | POA: Diagnosis not present

## 2017-09-26 MED ORDER — VITAMIN D (ERGOCALCIFEROL) 1.25 MG (50000 UNIT) PO CAPS: 50000.0000 [IU] | ORAL_CAPSULE | ORAL | 0 refills | Status: DC

## 2017-09-26 NOTE — Progress Notes (Signed)
Office: [phone]  /  Fax: [phone]   HPI:   Chief Complaint: OBESITY Stacey Cooper is here to discuss her progress with her obesity treatment plan. She is on the Category 2 plan and is following her eating plan approximately 100 % of the time. She states she is doing yard work and mowing for 60 minutes 3 times per week for exercise. Stacey Cooper has an upper respiratory infection, but has still managed to follow the plan and get her protein intake. Hunger is well controlled. Her weight is 183 lb (83 kg) today and has had a weight loss of 2 pounds over a period of 3 weeks since her last visit. She has lost 40 lbs since starting treatment with [name].  Vitamin D deficiency Stacey Cooper has a diagnosis of vitamin D deficiency. She is currently taking vit D and denies nausea, vomiting or muscle weakness.  Diabetes II Stacey Cooper has a diagnosis of diabetes type II. Stacey Cooper states fasting BGs range in the 90's and has felt hypoglycemic with blood sugars. She has been working on intensive lifestyle modifications including diet, exercise, and weight loss to help control her blood glucose levels.  ALLERGIES: Allergies  Allergen Reactions  . Contrast Media [Iodinated Diagnostic Agents] Shortness Of Breath  . Lovastatin     myalgias    MEDICATIONS: Current Outpatient Prescriptions on File Prior to Visit  Medication Sig Dispense Refill  . furosemide (LASIX) 20 MG tablet Take 1 tablet (20 mg total) by mouth daily. 90 tablet 0  . glucose blood (FREESTYLE LITE) test strip USE TO TEST BLOOD SUGAR TWICE DAILY AS DIRECTED 100 each 6  . Insulin Pen Needle (NOVOFINE) 32G X 6 MM MISC Use with Victoza 100 each 6  . Lancets MISC by Does not apply route. Check blood sugars qam and once daily prn     . liraglutide (VICTOZA) 18 MG/3ML SOPN Inject 0.3 mLs (1.8 mg total) into the skin daily. (Patient taking differently: Inject 0.9 mg into the skin daily. ) 9 mL 3  . lisinopril (PRINIVIL,ZESTRIL) 5 MG tablet TAKE 1 TABLET(5 MG) BY  MOUTH DAILY 90 tablet 1  . rosuvastatin (CRESTOR) 5 MG tablet TAKE ONE TABLET EVERY OTHER DAY. 30 tablet 1  . Vitamin D, Ergocalciferol, (DRISDOL) 50000 units CAPS capsule Take 1 capsule (50,000 Units total) by mouth every 7 (seven) days. 4 capsule 0   No current facility-administered medications on file prior to visit.     PAST MEDICAL HISTORY: Past Medical History:  Diagnosis Date  . ABDOMINAL PAIN, RIGHT UPPER QUADRANT, HX OF 01/24/2011  . Actinic keratoses 07/18/2011  . Arthralgia 05/03/2015  . Back pain 12/08/2011  . Basal cell carcinoma of skin of lip 01/24/2011  . Blood transfusion without reported diagnosis   . Bronchitis 12/20/2012  . Cataract    bilataeral cateracts removed  . CHOLELITHIASIS, HX OF 02/07/2011  . Chronic kidney disease   . Diabetes mellitus   . Diabetes mellitus type 2 in obese [facility]) 01/24/2011   Qualifier: Diagnosis of  By: [name], [name]  Annual eye exam with [facility] on 06/14/13   . DM 01/24/2011  . Essential hypertension, benign 01/24/2011  . Fatty liver   . GERD 01/24/2011  . Grief reaction 01/20/2014  . HAIR LOSS 01/24/2011  . Hearing loss of both ears 06/28/2013  . Hepatic cirrhosis ([place]) 04/27/2015  . Hernia, abdominal   . Hyperlipidemia 04/24/2012  . Hyperlipidemia, mixed 04/24/2012  . Insomnia 07/24/2012  . Joint pain   . Leg cramps  06/06/2013  . MEASLES, HX OF 01/24/2011  . Morbid obesity ([place]) 01/24/2011  . NONSPEC ELEVATION OF LEVELS OF TRANSAMINASE/LDH 02/07/2011  . Peripheral neuropathy 04/20/2011  . Preventative health care 08/23/2016  . Right hip pain 05/03/2015  . SCC (squamous cell carcinoma), face 07/18/2011  . SQUAMOUS CELL CARCINOMA OTHER SPEC SITES SKIN 01/24/2011  . Swelling   . Tubular adenoma of colon 04/2015  . Umbilical hernia 4/85/4627  . URINARY INCONTINENCE 02/07/2011  . Urinary tract infection, site not specified 04/30/2014  . UTI'S, HX OF 01/24/2011  . Vulvar atrophy 03/17/2017    PAST SURGICAL HISTORY: Past Surgical History:   Procedure Laterality Date  . ABDOMINAL HYSTERECTOMY     partial, both ovaries left in place  . BCC removed below lower lip on left  26 yrs ago  . cataract extraction, b/l    . COLONOSCOPY    . Left knee arthroscopy and cleaning prior to TKR    . skin bx, right anterior CW , squamous cancer, had to redo to margins  12/2010  . total knee replacement, b/l      SOCIAL HISTORY: Social History  Substance Use Topics  . Smoking status: Never Smoker  . Smokeless tobacco: Never Used  . Alcohol use No    FAMILY HISTORY: Family History  Problem Relation Age of Onset  . Arthritis Mother        RA  . Myelodysplastic syndrome Mother   . Heart disease Mother   . Cancer Father        skin  . Stroke Father   . Colon polyps Father        40 in [place] intes removed carcinoid tumor confined in the tumor  . Stroke Maternal Grandmother   . Kidney failure Maternal Grandfather   . Esophageal cancer Neg Hx   . Rectal cancer Neg Hx   . Stomach cancer Neg Hx     ROS: Review of Systems  Constitutional: Positive for weight loss.  Gastrointestinal: Negative for nausea and vomiting.  Musculoskeletal:       Negative muscle weakness  Endo/Heme/Allergies:       Positive hypoglycemia    PHYSICAL EXAM: Blood pressure 101/62, pulse 89, temperature (!) 97.5 F (36.4 C), temperature source Oral, height 5\' 2"  (1.575 m), weight 183 lb (83 kg), SpO2 99 %. Body mass index is 33.47 kg/m. Physical Exam  Constitutional: She is oriented to person, place, and time. She appears well-developed and well-nourished.  Cardiovascular: Normal rate.   Pulmonary/Chest: Effort normal.  Musculoskeletal: Normal range of motion.  Neurological: She is oriented to person, place, and time.  Skin: Skin is warm and dry.  Psychiatric: She has a normal mood and affect. Her behavior is normal.  Vitals reviewed.   RECENT LABS AND TESTS: BMET    Component Value Date/Time   NA 140 09/11/2017 0756   K 3.6 09/11/2017 0756    CL 105 09/11/2017 0756   CO2 25 09/11/2017 0756   GLUCOSE 110 (H) 09/11/2017 0756   BUN 15 09/11/2017 0756   CREATININE 0.75 09/11/2017 0756   CREATININE 0.73 07/27/2015 0751   CALCIUM 9.6 09/11/2017 0756   GFRNONAA 80 07/27/2015 0751   GFRAA >89 07/27/2015 0751   Lab Results  Component Value Date   HGBA1C 5.8 09/18/2017   HGBA1C 6.8 (H) 05/15/2017   HGBA1C 6.8 (H) 02/15/2017   HGBA1C 6.3 08/23/2016   HGBA1C 6.6 (H) 05/11/2016   Lab Results  Component Value Date   INSULIN 16.4  05/15/2017   CBC    Component Value Date/Time   WBC 7.6 09/11/2017 0756   RBC 4.18 09/11/2017 0756   HGB 13.2 09/11/2017 0756   HCT 39.5 09/11/2017 0756   PLT 168.0 09/11/2017 0756   MCV 94.6 09/11/2017 0756   MCH 31.9 04/03/2015 1443   MCHC 33.5 09/11/2017 0756   RDW 13.2 09/11/2017 0756   LYMPHSABS 1.4 05/07/2015 1159   MONOABS 0.8 05/07/2015 1159   EOSABS 0.1 05/07/2015 1159   BASOSABS 0.1 05/07/2015 1159   Iron/TIBC/Ferritin/ %Sat    Component Value Date/Time   IRON 101 01/24/2011 2029   FERRITIN 171 01/24/2011 2029   Lipid Panel     Component Value Date/Time   CHOL 152 09/11/2017 0756   CHOL 176 05/15/2017 1001   TRIG 137.0 09/11/2017 0756   HDL 42.20 09/11/2017 0756   HDL 48 05/15/2017 1001   CHOLHDL 4 09/11/2017 0756   VLDL 27.4 09/11/2017 0756   LDLCALC 82 09/11/2017 0756   LDLCALC 100 (H) 05/15/2017 1001   Hepatic Function Panel     Component Value Date/Time   PROT 6.6 09/11/2017 0756   ALBUMIN 3.6 09/11/2017 0756   AST 25 09/11/2017 0756   ALT 15 09/11/2017 0756   ALKPHOS 94 09/11/2017 0756   BILITOT 0.6 09/11/2017 0756   BILIDIR 0.3 01/19/2015 0751   IBILI 0.9 06/24/2014 0847      Component Value Date/Time   TSH 3.19 09/11/2017 0756   TSH 3.10 02/15/2017 0712   TSH 3.77 08/23/2016 0816    ASSESSMENT AND PLAN: Vitamin D deficiency - Plan: VITAMIN D 25 Hydroxy (Vit-D Deficiency, Fractures), Vitamin D, Ergocalciferol, (DRISDOL) 50000 units CAPS  capsule  Type 2 diabetes mellitus with hypoglycemia without coma, without long-term current use of insulin (HCC)  Class 1 obesity with serious comorbidity and body mass index (BMI) of 33.0 to 33.9 in adult, unspecified obesity type  PLAN:  Vitamin D Deficiency Stacey Cooper was informed that low vitamin D levels contributes to fatigue and are associated with obesity, breast, and colon cancer. She agrees to continue to take prescription Vit D @50 ,000 IU every week #4 with no refills, we will check labs and will follow up for routine testing of vitamin D, at least 2-3 times per year. She was informed of the risk of over-replacement of vitamin D and agrees to not increase her dose unless he discusses this with [name] first. Stacey Cooper agrees to follow up with our clinic in 2 weeks.  Diabetes II Stacey Cooper has been given extensive diabetes education by myself today including ideal fasting and post-prandial blood glucose readings, individual ideal Hgb A1c goals  and hypoglycemia prevention. We discussed the importance of good blood sugar control to decrease the likelihood of diabetic complications such as nephropathy, neuropathy, limb loss, blindness, coronary artery disease, and death. We discussed the importance of intensive lifestyle modification including diet, exercise and weight loss as the first line treatment for diabetes. Stacey Cooper agrees to stop metformin and agrees to increase Victoza to 0.9 mg and will follow up at the agreed upon time.  Obesity Stacey Cooper is currently in the action stage of change. As such, her goal is to continue with weight loss efforts She has agreed to follow the Category 2 plan Stacey Cooper has been instructed to work up to a goal of 150 minutes of combined cardio and strengthening exercise per week for weight loss and overall health benefits. We discussed the following Behavioral Modification Strategies today: increasing lean protein intake and work on  meal planning and easy cooking plans  Stacey Cooper has  agreed to follow up with our clinic in 2 weeks. She was informed of the importance of frequent follow up visits to maximize her success with intensive lifestyle modifications for her multiple health conditions.  I, Stacey Cooper, am acting as transcriptionist for Stacey Duverney, PA-C  I have reviewed the above documentation for accuracy and completeness, and I agree with the above. -Stacey Duverney, PA-C  I have reviewed the above note and agree with the plan. -Stacey Nip, MD   OBESITY BEHAVIORAL INTERVENTION VISIT  Today's visit was # 7 out of 22.  Starting weight: 223 lbs Starting date: 05/15/17 Today's weight : 183 lbs Today's date: 09/26/2017 Total lbs lost to date: 46 (Patients must lose 7 lbs in the first 6 months to continue with counseling)   ASK: We discussed the diagnosis of obesity with Marquita Palms today and Caitlin agreed to give [name] permission to discuss obesity behavioral modification therapy today.  ASSESS: Jamiaya has the diagnosis of obesity and her BMI today is 33.46 Alaze is in the action stage of change   ADVISE: Sherise was educated on the multiple health risks of obesity as well as the benefit of weight loss to improve her health. She was advised of the need for long term treatment and the importance of lifestyle modifications.  AGREE: Multiple dietary modification options and treatment options were discussed and  Ayslin agreed to follow the Category 2 plan We discussed the following Behavioral Modification Strategies today: increasing lean protein intake and work on meal planning and easy cooking plans

## 2017-09-27 LAB — VITAMIN D 25 HYDROXY (VIT D DEFICIENCY, FRACTURES): VIT D 25 HYDROXY: 52.2 ng/mL (ref 30.0–100.0)

## 2017-10-11 ENCOUNTER — Ambulatory Visit (INDEPENDENT_AMBULATORY_CARE_PROVIDER_SITE_OTHER): Payer: Medicare Other | Admitting: Physician Assistant

## 2017-10-11 ENCOUNTER — Encounter (INDEPENDENT_AMBULATORY_CARE_PROVIDER_SITE_OTHER): Payer: Self-pay

## 2017-10-12 ENCOUNTER — Ambulatory Visit (INDEPENDENT_AMBULATORY_CARE_PROVIDER_SITE_OTHER): Payer: Medicare Other | Admitting: Physician Assistant

## 2017-10-12 VITALS — BP 114/73 | HR 83 | Temp 97.6°F | Ht 62.0 in | Wt 181.0 lb

## 2017-10-12 DIAGNOSIS — Z6833 Body mass index (BMI) 33.0-33.9, adult: Secondary | ICD-10-CM

## 2017-10-12 DIAGNOSIS — E669 Obesity, unspecified: Secondary | ICD-10-CM | POA: Diagnosis not present

## 2017-10-12 DIAGNOSIS — K5909 Other constipation: Secondary | ICD-10-CM

## 2017-10-12 DIAGNOSIS — E119 Type 2 diabetes mellitus without complications: Secondary | ICD-10-CM | POA: Diagnosis not present

## 2017-10-12 NOTE — Progress Notes (Signed)
Office: [phone]  /  Fax: [phone]   HPI:   Chief Complaint: OBESITY Stacey Cooper is here to discuss her progress with her obesity treatment plan. She is on the Category 2 plan and is following her eating plan approximately 85 % of the time. She states she is exercising 0 minutes 0 times per week. Stacey Cooper continues to do well with weight loss. She states a structured meal plan is easy to follow and plan for. She states she is satified with her options and would like more microwave meal ideas. She will be out of town for the next 4 weeks.  Her weight is 181 lb (82.1 kg) today and has had a weight loss of 2 pounds over a period of 2 weeks since her last visit. She has lost 42 lbs since starting treatment with [name].  Diabetes II Stacey Cooper has a diagnosis of diabetes type II. [name]'s fasting BGs range between 90's and 110's and postprandials range in 130's. She denies any hypoglycemic episodes. Last A1c was 5.8 on 09/18/17. She has been working on intensive lifestyle modifications including diet, exercise, and weight loss to help control her blood glucose levels.  Constipation Stacey Cooper notes constipation for the last few weeks, worse since attempting weight loss. She states BM are less frequent and are not hard and painful. She denies hematochezia or melena. She denies drinking less H20 recently.  ALLERGIES: Allergies  Allergen Reactions  . Contrast Media [Iodinated Diagnostic Agents] Shortness Of Breath  . Lovastatin     myalgias    MEDICATIONS: Current Outpatient Prescriptions on File Prior to Visit  Medication Sig Dispense Refill  . furosemide (LASIX) 20 MG tablet Take 1 tablet (20 mg total) by mouth daily. 90 tablet 0  . glucose blood (FREESTYLE LITE) test strip USE TO TEST BLOOD SUGAR TWICE DAILY AS DIRECTED 100 each 6  . Insulin Pen Needle (NOVOFINE) 32G X 6 MM MISC Use with Victoza 100 each 6  . Lancets MISC by Does not apply route. Check blood sugars qam and once daily prn     .  liraglutide (VICTOZA) 18 MG/3ML SOPN Inject 0.3 mLs (1.8 mg total) into the skin daily. (Patient taking differently: Inject 0.9 mg into the skin daily. ) 9 mL 3  . lisinopril (PRINIVIL,ZESTRIL) 5 MG tablet TAKE 1 TABLET(5 MG) BY MOUTH DAILY 90 tablet 1  . rosuvastatin (CRESTOR) 5 MG tablet TAKE ONE TABLET EVERY OTHER DAY. 30 tablet 1  . Vitamin D, Ergocalciferol, (DRISDOL) 50000 units CAPS capsule Take 1 capsule (50,000 Units total) by mouth every 7 (seven) days. 4 capsule 0   No current facility-administered medications on file prior to visit.     PAST MEDICAL HISTORY: Past Medical History:  Diagnosis Date  . ABDOMINAL PAIN, RIGHT UPPER QUADRANT, HX OF 01/24/2011  . Actinic keratoses 07/18/2011  . Arthralgia 05/03/2015  . Back pain 12/08/2011  . Basal cell carcinoma of skin of lip 01/24/2011  . Blood transfusion without reported diagnosis   . Bronchitis 12/20/2012  . Cataract    bilataeral cateracts removed  . CHOLELITHIASIS, HX OF 02/07/2011  . Chronic kidney disease   . Diabetes mellitus   . Diabetes mellitus type 2 in obese [facility]) 01/24/2011   Qualifier: Diagnosis of  By: [name], [name]  Annual [facility] exam with [facility] on 06/14/13   . DM 01/24/2011  . Essential hypertension, benign 01/24/2011  . Fatty liver   . GERD 01/24/2011  . Grief reaction 01/20/2014  . HAIR LOSS 01/24/2011  .  Hearing loss of both ears 06/28/2013  . Hepatic cirrhosis ([place]) 04/27/2015  . Hernia, abdominal   . Hyperlipidemia 04/24/2012  . Hyperlipidemia, mixed 04/24/2012  . Insomnia 07/24/2012  . Joint pain   . Leg cramps 06/06/2013  . MEASLES, HX OF 01/24/2011  . Morbid obesity ([place]) 01/24/2011  . NONSPEC ELEVATION OF LEVELS OF TRANSAMINASE/LDH 02/07/2011  . Peripheral neuropathy 04/20/2011  . Preventative health care 08/23/2016  . Right hip pain 05/03/2015  . SCC (squamous cell carcinoma), face 07/18/2011  . SQUAMOUS CELL CARCINOMA OTHER SPEC SITES SKIN 01/24/2011  . Swelling   . Tubular adenoma of colon 04/2015    . Umbilical hernia 02/06/9416  . URINARY INCONTINENCE 02/07/2011  . Urinary tract infection, site not specified 04/30/2014  . UTI'S, HX OF 01/24/2011  . Vulvar atrophy 03/17/2017    PAST SURGICAL HISTORY: Past Surgical History:  Procedure Laterality Date  . ABDOMINAL HYSTERECTOMY     partial, both ovaries left in place  . BCC removed below lower lip on left  26 yrs ago  . cataract extraction, b/l    . COLONOSCOPY    . Left knee arthroscopy and cleaning prior to TKR    . skin bx, right anterior CW , squamous cancer, had to redo to margins  12/2010  . total knee replacement, b/l      SOCIAL HISTORY: Social History  Substance Use Topics  . Smoking status: Never Smoker  . Smokeless tobacco: Never Used  . Alcohol use No    FAMILY HISTORY: Family History  Problem Relation Age of Onset  . Arthritis Mother        RA  . Myelodysplastic syndrome Mother   . Heart disease Mother   . Cancer Father        skin  . Stroke Father   . Colon polyps Father        82 in [place] intes removed carcinoid tumor confined in the tumor  . Stroke Maternal Grandmother   . Kidney failure Maternal Grandfather   . Esophageal cancer Neg Hx   . Rectal cancer Neg Hx   . Stomach cancer Neg Hx     ROS: Review of Systems  Constitutional: Positive for weight loss.  Gastrointestinal: Positive for constipation. Negative for melena.       Negative hematochezia Negative hard and painful stools  Endo/Heme/Allergies:       Negative hypoglycemia    PHYSICAL EXAM: Blood pressure 114/73, pulse 83, temperature 97.6 F (36.4 C), height 5\' 2"  (1.575 m), weight 181 lb (82.1 kg), SpO2 99 %. Body mass index is 33.11 kg/m. Physical Exam  Constitutional: She is oriented to person, place, and time. She appears well-developed and well-nourished.  Cardiovascular: Normal rate.   Pulmonary/Chest: Effort normal.  Musculoskeletal: Normal range of motion.  Neurological: She is oriented to person, place, and time.   Skin: Skin is warm and dry.  Psychiatric: She has a normal mood and affect. Her behavior is normal.  Vitals reviewed.   RECENT LABS AND TESTS: BMET    Component Value Date/Time   NA 140 09/11/2017 0756   K 3.6 09/11/2017 0756   CL 105 09/11/2017 0756   CO2 25 09/11/2017 0756   GLUCOSE 110 (H) 09/11/2017 0756   BUN 15 09/11/2017 0756   CREATININE 0.75 09/11/2017 0756   CREATININE 0.73 07/27/2015 0751   CALCIUM 9.6 09/11/2017 0756   GFRNONAA 80 07/27/2015 0751   GFRAA >89 07/27/2015 0751   Lab Results  Component Value Date  HGBA1C 5.8 09/18/2017   HGBA1C 6.8 (H) 05/15/2017   HGBA1C 6.8 (H) 02/15/2017   HGBA1C 6.3 08/23/2016   HGBA1C 6.6 (H) 05/11/2016   Lab Results  Component Value Date   INSULIN 16.4 05/15/2017   CBC    Component Value Date/Time   WBC 7.6 09/11/2017 0756   RBC 4.18 09/11/2017 0756   HGB 13.2 09/11/2017 0756   HCT 39.5 09/11/2017 0756   PLT 168.0 09/11/2017 0756   MCV 94.6 09/11/2017 0756   MCH 31.9 04/03/2015 1443   MCHC 33.5 09/11/2017 0756   RDW 13.2 09/11/2017 0756   LYMPHSABS 1.4 05/07/2015 1159   MONOABS 0.8 05/07/2015 1159   EOSABS 0.1 05/07/2015 1159   BASOSABS 0.1 05/07/2015 1159   Iron/TIBC/Ferritin/ %Sat    Component Value Date/Time   IRON 101 01/24/2011 2029   FERRITIN 171 01/24/2011 2029   Lipid Panel     Component Value Date/Time   CHOL 152 09/11/2017 0756   CHOL 176 05/15/2017 1001   TRIG 137.0 09/11/2017 0756   HDL 42.20 09/11/2017 0756   HDL 48 05/15/2017 1001   CHOLHDL 4 09/11/2017 0756   VLDL 27.4 09/11/2017 0756   LDLCALC 82 09/11/2017 0756   LDLCALC 100 (H) 05/15/2017 1001   Hepatic Function Panel     Component Value Date/Time   PROT 6.6 09/11/2017 0756   ALBUMIN 3.6 09/11/2017 0756   AST 25 09/11/2017 0756   ALT 15 09/11/2017 0756   ALKPHOS 94 09/11/2017 0756   BILITOT 0.6 09/11/2017 0756   BILIDIR 0.3 01/19/2015 0751   IBILI 0.9 06/24/2014 0847      Component Value Date/Time   TSH 3.19  09/11/2017 0756   TSH 3.10 02/15/2017 0712   TSH 3.77 08/23/2016 0816    ASSESSMENT AND PLAN: Type 2 diabetes mellitus without complication, without long-term current use of insulin (HCC)  Other constipation  Class 1 obesity with serious comorbidity and body mass index (BMI) of 33.0 to 33.9 in adult, unspecified obesity type  PLAN:  Diabetes II Taytem has been given extensive diabetes education by myself today including ideal fasting and post-prandial blood glucose readings, individual ideal Hgb A1c goals and hypoglycemia prevention. We discussed the importance of good blood sugar control to decrease the likelihood of diabetic complications such as nephropathy, neuropathy, limb loss, blindness, coronary artery disease, and death. We discussed the importance of intensive lifestyle modification including diet, exercise and weight loss as the first line treatment for diabetes. Kitzia agrees to continue Victoza as prescribed and she will follow up with our clinic in 5 weeks.  Constipation Fleeta was informed decrease bowel movement frequency is normal while losing weight, but stools should not be hard or painful. She was advised to increase her H20 intake and work on increasing her fiber intake. High fiber foods were discussed today. Maley agrees to start miralax 17 g 1 capful daily and she will follow up with our clinic in 5 weeks.  Obesity Thao is currently in the action stage of change. As such, her goal is to continue with weight loss efforts She has agreed to follow the Category 2 plan Zakiyah has been instructed to work up to a goal of 150 minutes of combined cardio and strengthening exercise per week for weight loss and overall health benefits. We discussed the following Behavioral Modification Strategies today: increasing lean protein intake and work on meal planning and easy cooking plans   Marjean has agreed to follow up with our clinic in 5 weeks.  She was informed of the importance of  frequent follow up visits to maximize her success with intensive lifestyle modifications for her multiple health conditions.  I, Trixie Dredge, am acting as transcriptionist for Lacy Duverney, PA-C  I have reviewed the above documentation for accuracy and completeness, and I agree with the above. -Lacy Duverney, PA-C  I have reviewed the above note and agree with the plan. -Dennard Nip, MD     Today's visit was # 8 out of 22.  Starting weight: 223 lbs Starting date: 05/15/17 Today's weight : 181 lbs  Today's date: 10/12/2017 Total lbs lost to date: 43 (Patients must lose 7 lbs in the first 6 months to continue with counseling)   ASK: We discussed the diagnosis of obesity with Marquita Palms today and Mikaelah agreed to give [name] permission to discuss obesity behavioral modification therapy today.  ASSESS: Lakiya has the diagnosis of obesity and her BMI today is 16 Charolette is in the action stage of change   ADVISE: Querida was educated on the multiple health risks of obesity as well as the benefit of weight loss to improve her health. She was advised of the need for long term treatment and the importance of lifestyle modifications.  AGREE: Multiple dietary modification options and treatment options were discussed and  Yaeli agreed to follow the Category 2 plan We discussed the following Behavioral Modification Strategies today: increasing lean protein intake and work on meal planning and easy cooking plans

## 2017-11-14 ENCOUNTER — Ambulatory Visit (INDEPENDENT_AMBULATORY_CARE_PROVIDER_SITE_OTHER): Payer: Medicare Other | Admitting: Physician Assistant

## 2017-11-14 VITALS — BP 107/68 | HR 80 | Temp 97.6°F | Ht 62.0 in | Wt 178.0 lb

## 2017-11-14 DIAGNOSIS — E559 Vitamin D deficiency, unspecified: Secondary | ICD-10-CM | POA: Diagnosis not present

## 2017-11-14 DIAGNOSIS — Z6832 Body mass index (BMI) 32.0-32.9, adult: Secondary | ICD-10-CM | POA: Diagnosis not present

## 2017-11-14 DIAGNOSIS — E669 Obesity, unspecified: Secondary | ICD-10-CM | POA: Diagnosis not present

## 2017-11-14 DIAGNOSIS — E119 Type 2 diabetes mellitus without complications: Secondary | ICD-10-CM

## 2017-11-14 MED ORDER — VITAMIN D (ERGOCALCIFEROL) 1.25 MG (50000 UNIT) PO CAPS: 50000.0000 [IU] | ORAL_CAPSULE | ORAL | 0 refills | Status: DC

## 2017-11-14 NOTE — Progress Notes (Signed)
Office: [phone]  /  Fax: [phone]   HPI:   Chief Complaint: OBESITY Stacey Cooper is here to discuss her progress with her obesity treatment plan. She is on the Category 2 plan and is following her eating plan approximately 100 % of the time. She states she is walking for 60 minutes 7 times per week. Stacey Cooper continues to do well with weight loss. She would like more meal planning ideas and holiday eating strategies. Her weight is 178 lb (80.7 kg) today and has had a weight loss of 3 pounds over a period of 4 weeks since her last visit. She has lost 45 lbs since starting treatment with [name].  Vitamin D deficiency Stacey Cooper has a diagnosis of vitamin D deficiency. She is currently taking vit D and denies nausea, vomiting or muscle weakness.  Diabetes II Stacey Cooper has a diagnosis of diabetes type II. Stacey Cooper states fasting BGs range between 100's and 120's and denies any hypoglycemic episodes. She has been working on intensive lifestyle modifications including diet, exercise, and weight loss to help control her blood glucose levels.  ALLERGIES: Allergies  Allergen Reactions  . Contrast Media [Iodinated Diagnostic Agents] Shortness Of Breath  . Lovastatin     myalgias    MEDICATIONS: Current Outpatient Medications on File Prior to Visit  Medication Sig Dispense Refill  . furosemide (LASIX) 20 MG tablet Take 1 tablet (20 mg total) by mouth daily. 90 tablet 0  . glucose blood (FREESTYLE LITE) test strip USE TO TEST BLOOD SUGAR TWICE DAILY AS DIRECTED 100 each 6  . Insulin Pen Needle (NOVOFINE) 32G X 6 MM MISC Use with Victoza 100 each 6  . Lancets MISC by Does not apply route. Check blood sugars qam and once daily prn     . liraglutide (VICTOZA) 18 MG/3ML SOPN Inject 0.3 mLs (1.8 mg total) into the skin daily. (Patient taking differently: Inject 0.9 mg into the skin daily. ) 9 mL 3  . lisinopril (PRINIVIL,ZESTRIL) 5 MG tablet TAKE 1 TABLET(5 MG) BY MOUTH DAILY 90 tablet 1  . rosuvastatin (CRESTOR)  5 MG tablet TAKE ONE TABLET EVERY OTHER DAY. 30 tablet 1   No current facility-administered medications on file prior to visit.     PAST MEDICAL HISTORY: Past Medical History:  Diagnosis Date  . ABDOMINAL PAIN, RIGHT UPPER QUADRANT, HX OF 01/24/2011  . Actinic keratoses 07/18/2011  . Arthralgia 05/03/2015  . Back pain 12/08/2011  . Basal cell carcinoma of skin of lip 01/24/2011  . Blood transfusion without reported diagnosis   . Bronchitis 12/20/2012  . Cataract    bilataeral cateracts removed  . CHOLELITHIASIS, HX OF 02/07/2011  . Chronic kidney disease   . Diabetes mellitus   . Diabetes mellitus type 2 in obese [facility]) 01/24/2011   Qualifier: Diagnosis of  By: [name], [name]  Annual eye exam with [facility] on 06/14/13   . DM 01/24/2011  . Essential hypertension, benign 01/24/2011  . Fatty liver   . GERD 01/24/2011  . Grief reaction 01/20/2014  . HAIR LOSS 01/24/2011  . Hearing loss of both ears 06/28/2013  . Hepatic cirrhosis ([place]) 04/27/2015  . Hernia, abdominal   . Hyperlipidemia 04/24/2012  . Hyperlipidemia, mixed 04/24/2012  . Insomnia 07/24/2012  . Joint pain   . Leg cramps 06/06/2013  . MEASLES, HX OF 01/24/2011  . Morbid obesity ([place]) 01/24/2011  . NONSPEC ELEVATION OF LEVELS OF TRANSAMINASE/LDH 02/07/2011  . Peripheral neuropathy 04/20/2011  . Preventative health care 08/23/2016  . Right  hip pain 05/03/2015  . SCC (squamous cell carcinoma), face 07/18/2011  . SQUAMOUS CELL CARCINOMA OTHER SPEC SITES SKIN 01/24/2011  . Swelling   . Tubular adenoma of colon 04/2015  . Umbilical hernia 8/67/6195  . URINARY INCONTINENCE 02/07/2011  . Urinary tract infection, site not specified 04/30/2014  . UTI'S, HX OF 01/24/2011  . Vulvar atrophy 03/17/2017    PAST SURGICAL HISTORY: Past Surgical History:  Procedure Laterality Date  . ABDOMINAL HYSTERECTOMY     partial, both ovaries left in place  . BCC removed below lower lip on left  26 yrs ago  . cataract extraction, b/l    . COLONOSCOPY     . Left knee arthroscopy and cleaning prior to TKR    . skin bx, right anterior CW , squamous cancer, had to redo to margins  12/2010  . total knee replacement, b/l      SOCIAL HISTORY: Social History   Tobacco Use  . Smoking status: Never Smoker  . Smokeless tobacco: Never Used  Substance Use Topics  . Alcohol use: No    Alcohol/week: 0.0 oz  . Drug use: No    FAMILY HISTORY: Family History  Problem Relation Age of Onset  . Arthritis Mother        RA  . Myelodysplastic syndrome Mother   . Heart disease Mother   . Cancer Father        skin  . Stroke Father   . Colon polyps Father        43 in [place] intes removed carcinoid tumor confined in the tumor  . Stroke Maternal Grandmother   . Kidney failure Maternal Grandfather   . Esophageal cancer Neg Hx   . Rectal cancer Neg Hx   . Stomach cancer Neg Hx     ROS: Review of Systems  Constitutional: Positive for weight loss.  Gastrointestinal: Negative for nausea and vomiting.  Musculoskeletal:       Negative muscle weakness  Endo/Heme/Allergies:       Negative hypoglycemia    PHYSICAL EXAM: Blood pressure 107/68, pulse 80, temperature 97.6 F (36.4 C), temperature source Oral, height 5\' 2"  (1.575 m), weight 178 lb (80.7 kg), SpO2 98 %. Body mass index is 32.56 kg/m. Physical Exam  Constitutional: She is oriented to person, place, and time. She appears well-developed and well-nourished.  Cardiovascular: Normal rate.  Pulmonary/Chest: Effort normal.  Musculoskeletal: Normal range of motion.  Neurological: She is oriented to person, place, and time.  Skin: Skin is warm and dry.  Psychiatric: She has a normal mood and affect. Her behavior is normal.  Vitals reviewed.   RECENT LABS AND TESTS: BMET    Component Value Date/Time   NA 140 09/11/2017 0756   K 3.6 09/11/2017 0756   CL 105 09/11/2017 0756   CO2 25 09/11/2017 0756   GLUCOSE 110 (H) 09/11/2017 0756   BUN 15 09/11/2017 0756   CREATININE 0.75  09/11/2017 0756   CREATININE 0.73 07/27/2015 0751   CALCIUM 9.6 09/11/2017 0756   GFRNONAA 80 07/27/2015 0751   GFRAA >89 07/27/2015 0751   Lab Results  Component Value Date   HGBA1C 5.8 09/18/2017   HGBA1C 6.8 (H) 05/15/2017   HGBA1C 6.8 (H) 02/15/2017   HGBA1C 6.3 08/23/2016   HGBA1C 6.6 (H) 05/11/2016   Lab Results  Component Value Date   INSULIN 16.4 05/15/2017   CBC    Component Value Date/Time   WBC 7.6 09/11/2017 0756   RBC 4.18 09/11/2017 0756  HGB 13.2 09/11/2017 0756   HCT 39.5 09/11/2017 0756   PLT 168.0 09/11/2017 0756   MCV 94.6 09/11/2017 0756   MCH 31.9 04/03/2015 1443   MCHC 33.5 09/11/2017 0756   RDW 13.2 09/11/2017 0756   LYMPHSABS 1.4 05/07/2015 1159   MONOABS 0.8 05/07/2015 1159   EOSABS 0.1 05/07/2015 1159   BASOSABS 0.1 05/07/2015 1159   Iron/TIBC/Ferritin/ %Sat    Component Value Date/Time   IRON 101 01/24/2011 2029   FERRITIN 171 01/24/2011 2029   Lipid Panel     Component Value Date/Time   CHOL 152 09/11/2017 0756   CHOL 176 05/15/2017 1001   TRIG 137.0 09/11/2017 0756   HDL 42.20 09/11/2017 0756   HDL 48 05/15/2017 1001   CHOLHDL 4 09/11/2017 0756   VLDL 27.4 09/11/2017 0756   LDLCALC 82 09/11/2017 0756   LDLCALC 100 (H) 05/15/2017 1001   Hepatic Function Panel     Component Value Date/Time   PROT 6.6 09/11/2017 0756   ALBUMIN 3.6 09/11/2017 0756   AST 25 09/11/2017 0756   ALT 15 09/11/2017 0756   ALKPHOS 94 09/11/2017 0756   BILITOT 0.6 09/11/2017 0756   BILIDIR 0.3 01/19/2015 0751   IBILI 0.9 06/24/2014 0847      Component Value Date/Time   TSH 3.19 09/11/2017 0756   TSH 3.10 02/15/2017 0712   TSH 3.77 08/23/2016 0816    ASSESSMENT AND PLAN: Vitamin D deficiency - Plan: Vitamin D, Ergocalciferol, (DRISDOL) 50000 units CAPS capsule  Type 2 diabetes mellitus without complication, without long-term current use of insulin (HCC)  Class 1 obesity with serious comorbidity and body mass index (BMI) of 32.0 to 32.9 in  adult, unspecified obesity type  PLAN:  Vitamin D Deficiency Stacey Cooper was informed that low vitamin D levels contributes to fatigue and are associated with obesity, breast, and colon cancer. She agrees to continue to take prescription Vit D @50 ,000 IU, one pill every other week #2 with no refills and will follow up for routine testing of vitamin D, at least 2-3 times per year. She was informed of the risk of over-replacement of vitamin D and agrees to not increase her dose unless he discusses this with [name] first. Stacey Cooper agrees to follow up with our clinic in 4 weeks.  Diabetes II Stacey Cooper has been given extensive diabetes education by myself today including ideal fasting and post-prandial blood glucose readings, individual ideal Hgb A1c goals  and hypoglycemia prevention. We discussed the importance of good blood sugar control to decrease the likelihood of diabetic complications such as nephropathy, neuropathy, limb loss, blindness, coronary artery disease, and death. We discussed the importance of intensive lifestyle modification including diet, exercise and weight loss as the first line treatment for diabetes. Stacey Cooper agrees to continue her diabetes medications and will follow up at the agreed upon time.  Obesity Stacey Cooper is currently in the action stage of change. As such, her goal is to continue with weight loss efforts She has agreed to keep a food journal with 1200 calories and 85 grams of protein daily Stacey Cooper has been instructed to work up to a goal of 150 minutes of combined cardio and strengthening exercise per week for weight loss and overall health benefits. We discussed the following Behavioral Modification Strategies today: increasing lean protein intake and holiday eating strategies   Stacey Cooper has agreed to follow up with our clinic in 4 weeks. She was informed of the importance of frequent follow up visits to maximize her success with intensive  lifestyle modifications for her multiple health  conditions.  I, Doreene Nest, am acting as transcriptionist for Lacy Duverney, PA-C  I have reviewed the above documentation for accuracy and completeness, and I agree with the above. -Lacy Duverney, PA-C  I have reviewed the above note and agree with the plan. -Dennard Nip, MD   OBESITY BEHAVIORAL INTERVENTION VISIT  Today's visit was # 9 out of 22.  Starting weight: 223 lbs Starting date: 05/15/17 Today's weight : 178 lbs Today's date: 11/14/2017 Total lbs lost to date: 57 (Patients must lose 7 lbs in the first 6 months to continue with counseling)   ASK: We discussed the diagnosis of obesity with Stacey Cooper today and Stacey Cooper agreed to give [name] permission to discuss obesity behavioral modification therapy today.  ASSESS: Stacey Cooper has the diagnosis of obesity and her BMI today is 32.55 Stacey Cooper is in the action stage of change   ADVISE: Stacey Cooper was educated on the multiple health risks of obesity as well as the benefit of weight loss to improve her health. She was advised of the need for long term treatment and the importance of lifestyle modifications.  AGREE: Multiple dietary modification options and treatment options were discussed and  Stacey Cooper agreed to keep a food journal with 1200 calories and 85 grams of protein daily We discussed the following Behavioral Modification Strategies today: increasing lean protein intake and holiday eating strategies

## 2017-12-06 ENCOUNTER — Telehealth: Payer: Self-pay

## 2017-12-06 DIAGNOSIS — K7460 Unspecified cirrhosis of liver: Secondary | ICD-10-CM

## 2017-12-06 NOTE — Telephone Encounter (Signed)
Patient is scheduled at [facility] for 12/11/17 7:00 patient notified to be NPO after midnight and arrive at 6:45

## 2017-12-11 ENCOUNTER — Ambulatory Visit (HOSPITAL_COMMUNITY)
Admission: RE | Admit: 2017-12-11 | Discharge: 2017-12-11 | Disposition: A | Discharge status: Home / Self Care | Payer: Medicare Other | Source: Ambulatory Visit | Attending: Gastroenterology | Admitting: Gastroenterology

## 2017-12-11 ENCOUNTER — Other Ambulatory Visit: Payer: Medicare Other

## 2017-12-11 DIAGNOSIS — K8020 Calculus of gallbladder without cholecystitis without obstruction: Secondary | ICD-10-CM | POA: Insufficient documentation

## 2017-12-11 DIAGNOSIS — K7469 Other cirrhosis of liver: Secondary | ICD-10-CM

## 2017-12-11 DIAGNOSIS — K7460 Unspecified cirrhosis of liver: Secondary | ICD-10-CM | POA: Insufficient documentation

## 2017-12-12 ENCOUNTER — Other Ambulatory Visit: Payer: Self-pay

## 2017-12-12 ENCOUNTER — Ambulatory Visit (INDEPENDENT_AMBULATORY_CARE_PROVIDER_SITE_OTHER): Payer: Medicare Other | Admitting: Physician Assistant

## 2017-12-12 VITALS — BP 109/70 | HR 80 | Temp 97.6°F | Ht 62.0 in | Wt 177.0 lb

## 2017-12-12 DIAGNOSIS — E559 Vitamin D deficiency, unspecified: Secondary | ICD-10-CM

## 2017-12-12 DIAGNOSIS — E119 Type 2 diabetes mellitus without complications: Secondary | ICD-10-CM | POA: Diagnosis not present

## 2017-12-12 DIAGNOSIS — Z6832 Body mass index (BMI) 32.0-32.9, adult: Secondary | ICD-10-CM

## 2017-12-12 DIAGNOSIS — E669 Obesity, unspecified: Secondary | ICD-10-CM

## 2017-12-12 DIAGNOSIS — K7460 Unspecified cirrhosis of liver: Secondary | ICD-10-CM

## 2017-12-12 LAB — AFP TUMOR MARKER: AFP TUMOR MARKER: 3.5 ng/mL

## 2017-12-12 NOTE — Progress Notes (Signed)
Office: [phone]  /  Fax: [phone]   HPI:   Chief Complaint: OBESITY Stacey Cooper is here to discuss her progress with her obesity treatment plan. She is on the keep a food journal with 1200 calories and 85 grams of protein daily and is following her eating plan approximately 100 % of the time. She states she is exercising 0 minutes 0 times per week. Stacey Cooper continues to do well with weight loss. She would like more breakfast options and convenient meal options.  Her weight is 177 lb (80.3 kg) today and has had a weight loss of 1 pound over a period of 4 weeks since her last visit. She has lost 46 lbs since starting treatment with [name].  Vitamin D deficiency Stacey Cooper has a diagnosis of vitamin D deficiency. She is currently taking prescription Vit D and denies nausea, vomiting or muscle weakness.  Diabetes II without complications, without Insulin use Stacey Cooper has a diagnosis of diabetes type II. Stacey Cooper states fasting BGs range average in 110's and she denies any hypoglycemic episodes. Last A1c was 5.8 on 09/18/17. She has been working on intensive lifestyle modifications including diet, exercise, and weight loss to help control her blood glucose levels.  ALLERGIES: Allergies  Allergen Reactions  . Contrast Media [Iodinated Diagnostic Agents] Shortness Of Breath  . Lovastatin     myalgias    MEDICATIONS: Current Outpatient Medications on File Prior to Visit  Medication Sig Dispense Refill  . furosemide (LASIX) 20 MG tablet Take 1 tablet (20 mg total) by mouth daily. 90 tablet 0  . glucose blood (FREESTYLE LITE) test strip USE TO TEST BLOOD SUGAR TWICE DAILY AS DIRECTED 100 each 6  . Insulin Pen Needle (NOVOFINE) 32G X 6 MM MISC Use with Victoza 100 each 6  . Lancets MISC by Does not apply route. Check blood sugars qam and once daily prn     . liraglutide (VICTOZA) 18 MG/3ML SOPN Inject 0.3 mLs (1.8 mg total) into the skin daily. (Patient taking differently: Inject 0.9 mg into the skin daily.  ) 9 mL 3  . lisinopril (PRINIVIL,ZESTRIL) 5 MG tablet TAKE 1 TABLET(5 MG) BY MOUTH DAILY 90 tablet 1  . rosuvastatin (CRESTOR) 5 MG tablet TAKE ONE TABLET EVERY OTHER DAY. 30 tablet 1  . Vitamin D, Ergocalciferol, (DRISDOL) 50000 units CAPS capsule Take 1 capsule (50,000 Units total) every 7 (seven) days by mouth. 2 capsule 0   No current facility-administered medications on file prior to visit.     PAST MEDICAL HISTORY: Past Medical History:  Diagnosis Date  . ABDOMINAL PAIN, RIGHT UPPER QUADRANT, HX OF 01/24/2011  . Actinic keratoses 07/18/2011  . Arthralgia 05/03/2015  . Back pain 12/08/2011  . Basal cell carcinoma of skin of lip 01/24/2011  . Blood transfusion without reported diagnosis   . Bronchitis 12/20/2012  . Cataract    bilataeral cateracts removed  . CHOLELITHIASIS, HX OF 02/07/2011  . Chronic kidney disease   . Diabetes mellitus   . Diabetes mellitus type 2 in obese [facility]) 01/24/2011   Qualifier: Diagnosis of  By: [name], [name]  Annual eye exam with [facility] on 06/14/13   . DM 01/24/2011  . Essential hypertension, benign 01/24/2011  . Fatty liver   . GERD 01/24/2011  . Grief reaction 01/20/2014  . HAIR LOSS 01/24/2011  . Hearing loss of both ears 06/28/2013  . Hepatic cirrhosis ([place]) 04/27/2015  . Hernia, abdominal   . Hyperlipidemia 04/24/2012  . Hyperlipidemia, mixed 04/24/2012  . Insomnia  07/24/2012  . Joint pain   . Leg cramps 06/06/2013  . MEASLES, HX OF 01/24/2011  . Morbid obesity ([place]) 01/24/2011  . NONSPEC ELEVATION OF LEVELS OF TRANSAMINASE/LDH 02/07/2011  . Peripheral neuropathy 04/20/2011  . Preventative health care 08/23/2016  . Right hip pain 05/03/2015  . SCC (squamous cell carcinoma), face 07/18/2011  . SQUAMOUS CELL CARCINOMA OTHER SPEC SITES SKIN 01/24/2011  . Swelling   . Tubular adenoma of colon 04/2015  . Umbilical hernia 01/03/3234  . URINARY INCONTINENCE 02/07/2011  . Urinary tract infection, site not specified 04/30/2014  . UTI'S, HX OF 01/24/2011  .  Vulvar atrophy 03/17/2017    PAST SURGICAL HISTORY: Past Surgical History:  Procedure Laterality Date  . ABDOMINAL HYSTERECTOMY     partial, both ovaries left in place  . BCC removed below lower lip on left  26 yrs ago  . cataract extraction, b/l    . COLONOSCOPY    . Left knee arthroscopy and cleaning prior to TKR    . skin bx, right anterior CW , squamous cancer, had to redo to margins  12/2010  . total knee replacement, b/l      SOCIAL HISTORY: Social History   Tobacco Use  . Smoking status: Never Smoker  . Smokeless tobacco: Never Used  Substance Use Topics  . Alcohol use: No    Alcohol/week: 0.0 oz  . Drug use: No    FAMILY HISTORY: Family History  Problem Relation Age of Onset  . Arthritis Mother        RA  . Myelodysplastic syndrome Mother   . Heart disease Mother   . Cancer Father        skin  . Stroke Father   . Colon polyps Father        66 in [place] intes removed carcinoid tumor confined in the tumor  . Stroke Maternal Grandmother   . Kidney failure Maternal Grandfather   . Esophageal cancer Neg Hx   . Rectal cancer Neg Hx   . Stomach cancer Neg Hx     ROS: Review of Systems  Constitutional: Positive for weight loss.  Gastrointestinal: Negative for nausea and vomiting.  Musculoskeletal:       Negative muscle weakness  Endo/Heme/Allergies:       Negative hypoglycemia    PHYSICAL EXAM: Blood pressure 109/70, pulse 80, temperature 97.6 F (36.4 C), temperature source Oral, height 5\' 2"  (1.575 m), weight 177 lb (80.3 kg), SpO2 98 %. Body mass index is 32.37 kg/m. Physical Exam  Constitutional: She is oriented to person, place, and time. She appears well-developed and well-nourished.  Cardiovascular: Normal rate.  Pulmonary/Chest: Effort normal.  Musculoskeletal: Normal range of motion.  Neurological: She is oriented to person, place, and time.  Skin: Skin is warm and dry.  Psychiatric: She has a normal mood and affect. Her behavior is normal.    Vitals reviewed.   RECENT LABS AND TESTS: BMET    Component Value Date/Time   NA 140 09/11/2017 0756   K 3.6 09/11/2017 0756   CL 105 09/11/2017 0756   CO2 25 09/11/2017 0756   GLUCOSE 110 (H) 09/11/2017 0756   BUN 15 09/11/2017 0756   CREATININE 0.75 09/11/2017 0756   CREATININE 0.73 07/27/2015 0751   CALCIUM 9.6 09/11/2017 0756   GFRNONAA 80 07/27/2015 0751   GFRAA >89 07/27/2015 0751   Lab Results  Component Value Date   HGBA1C 5.8 09/18/2017   HGBA1C 6.8 (H) 05/15/2017   HGBA1C 6.8 (H)  02/15/2017   HGBA1C 6.3 08/23/2016   HGBA1C 6.6 (H) 05/11/2016   Lab Results  Component Value Date   INSULIN 16.4 05/15/2017   CBC    Component Value Date/Time   WBC 7.6 09/11/2017 0756   RBC 4.18 09/11/2017 0756   HGB 13.2 09/11/2017 0756   HCT 39.5 09/11/2017 0756   PLT 168.0 09/11/2017 0756   MCV 94.6 09/11/2017 0756   MCH 31.9 04/03/2015 1443   MCHC 33.5 09/11/2017 0756   RDW 13.2 09/11/2017 0756   LYMPHSABS 1.4 05/07/2015 1159   MONOABS 0.8 05/07/2015 1159   EOSABS 0.1 05/07/2015 1159   BASOSABS 0.1 05/07/2015 1159   Iron/TIBC/Ferritin/ %Sat    Component Value Date/Time   IRON 101 01/24/2011 2029   FERRITIN 171 01/24/2011 2029   Lipid Panel     Component Value Date/Time   CHOL 152 09/11/2017 0756   CHOL 176 05/15/2017 1001   TRIG 137.0 09/11/2017 0756   HDL 42.20 09/11/2017 0756   HDL 48 05/15/2017 1001   CHOLHDL 4 09/11/2017 0756   VLDL 27.4 09/11/2017 0756   LDLCALC 82 09/11/2017 0756   LDLCALC 100 (H) 05/15/2017 1001   Hepatic Function Panel     Component Value Date/Time   PROT 6.6 09/11/2017 0756   ALBUMIN 3.6 09/11/2017 0756   AST 25 09/11/2017 0756   ALT 15 09/11/2017 0756   ALKPHOS 94 09/11/2017 0756   BILITOT 0.6 09/11/2017 0756   BILIDIR 0.3 01/19/2015 0751   IBILI 0.9 06/24/2014 0847      Component Value Date/Time   TSH 3.19 09/11/2017 0756   TSH 3.10 02/15/2017 0712   TSH 3.77 08/23/2016 0816    ASSESSMENT AND PLAN: Vitamin D  deficiency - Plan: Vitamin D, Ergocalciferol, (DRISDOL) 50000 units CAPS capsule  Type 2 diabetes mellitus without complication, without long-term current use of insulin (HCC)  Class 1 obesity with serious comorbidity and body mass index (BMI) of 32.0 to 32.9 in adult, unspecified obesity type  PLAN:  Vitamin D Deficiency Stacey Cooper was informed that low vitamin D levels contributes to fatigue and are associated with obesity, breast, and colon cancer. Stacey Cooper agrees to continue taking prescription Vit D @50 ,000 IU every week #4 and we will refill for 1 month. She will follow up for routine testing of vitamin D, at least 2-3 times per year. She was informed of the risk of over-replacement of vitamin D and agrees to not increase her dose unless he discusses this with [name] first. Stacey Cooper agrees to follow up with our clinic in 4 weeks.  Diabetes II without complications, without Insulin use Stacey Cooper has been given extensive diabetes education by myself today including ideal fasting and post-prandial blood glucose readings, individual ideal Hgb A1c goals and hypoglycemia prevention. We discussed the importance of good blood sugar control to decrease the likelihood of diabetic complications such as nephropathy, neuropathy, limb loss, blindness, coronary artery disease, and death. We discussed the importance of intensive lifestyle modification including diet, exercise and weight loss as the first line treatment for diabetes. Stacey Cooper agrees to continue her diabetes medications and she will follow up with our clinic in 4 weeks.  Obesity Stacey Cooper is currently in the action stage of change. As such, her goal is to continue with weight loss efforts She has agreed to keep a food journal with 1200 calories and 85 grams of protein daily Stacey Cooper has been instructed to work up to a goal of 150 minutes of combined cardio and strengthening exercise per week  for weight loss and overall health benefits. We discussed the following  Behavioral Modification Strategies today: increasing lean protein intake and work on meal planning and easy cooking plans   Stacey Cooper has agreed to follow up with our clinic in 4 weeks. She was informed of the importance of frequent follow up visits to maximize her success with intensive lifestyle modifications for her multiple health conditions.  I, Trixie Dredge, am acting as transcriptionist for Lacy Duverney, PA-C  I have reviewed the above documentation for accuracy and completeness, and I agree with the above. -Lacy Duverney, PA-C  I have reviewed the above note and agree with the plan. -Dennard Nip, MD     Today's visit was # 10 out of 22.  Starting weight: 223 lbs Starting date: 05/15/17 Today's weight : 177 lbs  Today's date: 12/12/2017 Total lbs lost to date: 89 (Patients must lose 7 lbs in the first 6 months to continue with counseling)   ASK: We discussed the diagnosis of obesity with Stacey Cooper today and Stacey Cooper agreed to give [name] permission to discuss obesity behavioral modification therapy today.  ASSESS: Stacey Cooper has the diagnosis of obesity and her BMI today is 32.37 Stacey Cooper is in the action stage of change   ADVISE: Stacey Cooper was educated on the multiple health risks of obesity as well as the benefit of weight loss to improve her health. She was advised of the need for long term treatment and the importance of lifestyle modifications.  AGREE: Multiple dietary modification options and treatment options were discussed and  Stacey Cooper agreed to keep a food journal with 1200 calories and 85 grams of protein daily We discussed the following Behavioral Modification Strategies today: increasing lean protein intake and work on meal planning and easy cooking plans

## 2017-12-13 MED ORDER — VITAMIN D (ERGOCALCIFEROL) 1.25 MG (50000 UNIT) PO CAPS: 50000.0000 [IU] | ORAL_CAPSULE | ORAL | 0 refills | Status: DC

## 2018-01-09 ENCOUNTER — Ambulatory Visit (INDEPENDENT_AMBULATORY_CARE_PROVIDER_SITE_OTHER): Payer: Medicare HMO | Admitting: Physician Assistant

## 2018-01-09 VITALS — BP 106/67 | HR 79 | Temp 97.6°F | Ht 62.0 in | Wt 178.0 lb

## 2018-01-09 DIAGNOSIS — E559 Vitamin D deficiency, unspecified: Secondary | ICD-10-CM | POA: Diagnosis not present

## 2018-01-09 DIAGNOSIS — E119 Type 2 diabetes mellitus without complications: Secondary | ICD-10-CM

## 2018-01-09 DIAGNOSIS — L659 Nonscarring hair loss, unspecified: Secondary | ICD-10-CM

## 2018-01-09 DIAGNOSIS — E7849 Other hyperlipidemia: Secondary | ICD-10-CM

## 2018-01-09 DIAGNOSIS — I10 Essential (primary) hypertension: Secondary | ICD-10-CM

## 2018-01-09 DIAGNOSIS — Z6832 Body mass index (BMI) 32.0-32.9, adult: Secondary | ICD-10-CM | POA: Diagnosis not present

## 2018-01-09 DIAGNOSIS — E669 Obesity, unspecified: Secondary | ICD-10-CM | POA: Diagnosis not present

## 2018-01-09 NOTE — Progress Notes (Addendum)
Office: [phone]  /  Fax: [phone]   HPI:   Chief Complaint: OBESITY Stacey Cooper is here to discuss her progress with her obesity treatment plan. She is on the keep a food journal with 1200 calories and 85 grams of protein daily and is following her eating plan approximately 70 % of the time. She states she is walking for 60 minutes 5-7 times per week. Stacey Cooper deviated with her eating over the holidays. She continues to be mindful of her food choices. She is now back on track with her eating.  Her weight is 178 lb (80.7 kg) today and has gained 1 pound since her last visit. She has lost 45 lbs since starting treatment with [name].  Diabetes II Stacey Cooper has a diagnosis of diabetes type II. Ashe states fasting BGs range in 100's. She denies any hypoglycemic episodes. Last A1c was 5.8 on 09/18/17. She has been working on intensive lifestyle modifications including diet, exercise, and weight loss to help control her blood glucose levels.  Vitamin D deficiency Stacey Cooper has a diagnosis of vitamin D deficiency. She is currently taking prescription Vit D and denies nausea, vomiting or muscle weakness.  Hyperlipidemia Stacey Cooper has hyperlipidemia and has been trying to improve her cholesterol levels with intensive lifestyle modification including a low saturated fat diet, exercise and weight loss. She is on statin and denies any chest pain, claudication or myalgias.  Hypertension Stacey Cooper is a 83 y.o. female with hypertension. Stacey Cooper's blood pressure is stable and she denies chest pain or shortness of breath. She is working weight loss to help control her blood pressure with the goal of decreasing her risk of heart attack and stroke. Stacey Cooper's blood pressure is currently controlled.  Hair loss Complains of recent hair loss. Denies nay change in hair products. Denies any new medications. Denies any itching, pain, redness, swelling, or any rash on the scalp. Denies any episodes in the past. Has started taking  Biotin but states has increased her appetite so she stopped taking it.    ALLERGIES: Allergies  Allergen Reactions  . Contrast Media [Iodinated Diagnostic Agents] Shortness Of Breath  . Lovastatin     myalgias    MEDICATIONS: Current Outpatient Medications on File Prior to Visit  Medication Sig Dispense Refill  . furosemide (LASIX) 20 MG tablet Take 1 tablet (20 mg total) by mouth daily. 90 tablet 0  . glucose blood (FREESTYLE LITE) test strip USE TO TEST BLOOD SUGAR TWICE DAILY AS DIRECTED 100 each 6  . Insulin Pen Needle (NOVOFINE) 32G X 6 MM MISC Use with Victoza 100 each 6  . Lancets MISC by Does not apply route. Check blood sugars qam and once daily prn     . liraglutide (VICTOZA) 18 MG/3ML SOPN Inject 0.3 mLs (1.8 mg total) into the skin daily. (Patient taking differently: Inject 0.9 mg into the skin daily. ) 9 mL 3  . lisinopril (PRINIVIL,ZESTRIL) 5 MG tablet TAKE 1 TABLET(5 MG) BY MOUTH DAILY 90 tablet 1  . Vitamin D, Ergocalciferol, (DRISDOL) 50000 units CAPS capsule Take 1 capsule (50,000 Units total) by mouth as directed. Take one tab every 14 days 4 capsule 0  . rosuvastatin (CRESTOR) 5 MG tablet TAKE ONE TABLET EVERY OTHER DAY. (Patient not taking: Reported on 01/09/2018) 30 tablet 1   No current facility-administered medications on file prior to visit.     PAST MEDICAL HISTORY: Past Medical History:  Diagnosis Date  . ABDOMINAL PAIN, RIGHT UPPER QUADRANT, HX OF 01/24/2011  .  Actinic keratoses 07/18/2011  . Arthralgia 05/03/2015  . Back pain 12/08/2011  . Basal cell carcinoma of skin of lip 01/24/2011  . Blood transfusion without reported diagnosis   . Bronchitis 12/20/2012  . Cataract    bilataeral cateracts removed  . CHOLELITHIASIS, HX OF 02/07/2011  . Chronic kidney disease   . Diabetes mellitus   . Diabetes mellitus type 2 in obese [facility]) 01/24/2011   Qualifier: Diagnosis of  By: [name], [name]  Annual [facility] exam with [facility] on 06/14/13   . DM 01/24/2011    . Essential hypertension, benign 01/24/2011  . Fatty liver   . GERD 01/24/2011  . Grief reaction 01/20/2014  . HAIR LOSS 01/24/2011  . Hearing loss of both ears 06/28/2013  . Hepatic cirrhosis (Stacey Cooper) 04/27/2015  . Hernia, abdominal   . Hyperlipidemia 04/24/2012  . Hyperlipidemia, mixed 04/24/2012  . Insomnia 07/24/2012  . Joint pain   . Leg cramps 06/06/2013  . MEASLES, HX OF 01/24/2011  . Morbid obesity ([place]) 01/24/2011  . NONSPEC ELEVATION OF LEVELS OF TRANSAMINASE/LDH 02/07/2011  . Peripheral neuropathy 04/20/2011  . Preventative health care 08/23/2016  . Right hip pain 05/03/2015  . SCC (squamous cell carcinoma), face 07/18/2011  . SQUAMOUS CELL CARCINOMA OTHER SPEC SITES SKIN 01/24/2011  . Swelling   . Tubular adenoma of colon 04/2015  . Umbilical hernia 04/30/3975  . URINARY INCONTINENCE 02/07/2011  . Urinary tract infection, site not specified 04/30/2014  . UTI'S, HX OF 01/24/2011  . Vulvar atrophy 03/17/2017    PAST SURGICAL HISTORY: Past Surgical History:  Procedure Laterality Date  . ABDOMINAL HYSTERECTOMY     partial, both ovaries left in place  . BCC removed below lower lip on left  26 yrs ago  . cataract extraction, b/l    . COLONOSCOPY    . Left knee arthroscopy and cleaning prior to TKR    . skin bx, right anterior CW , squamous cancer, had to redo to margins  12/2010  . total knee replacement, b/l      SOCIAL HISTORY: Social History   Tobacco Use  . Smoking status: Never Smoker  . Smokeless tobacco: Never Used  Substance Use Topics  . Alcohol use: No    Alcohol/week: 0.0 oz  . Drug use: No    FAMILY HISTORY: Family History  Problem Relation Age of Onset  . Arthritis Mother        RA  . Myelodysplastic syndrome Mother   . Heart disease Mother   . Cancer Father        skin  . Stroke Father   . Colon polyps Father        71 in [place] intes removed carcinoid tumor confined in the tumor  . Stroke Maternal Grandmother   . Kidney failure Maternal Grandfather   .  Esophageal cancer Neg Hx   . Rectal cancer Neg Hx   . Stomach cancer Neg Hx     ROS: Review of Systems  Constitutional: Negative for weight loss.       Positive hair loss  Respiratory: Negative for shortness of breath.   Cardiovascular: Negative for chest pain and claudication.  Gastrointestinal: Negative for nausea and vomiting.  Musculoskeletal: Negative for myalgias.       Negative muscle weakness  Endo/Heme/Allergies:       Negative hypoglycemia    PHYSICAL EXAM: Blood pressure 106/67, pulse 79, temperature 97.6 F (36.4 C), temperature source Oral, height 5\' 2"  (1.575 m), weight 178 lb (80.7  kg), SpO2 99 %. Body mass index is 32.56 kg/m. Physical Exam  Constitutional: She is oriented to person, place, and time. She appears well-developed and well-nourished.  Cardiovascular: Normal rate.  Pulmonary/Chest: Effort normal.  Musculoskeletal: Normal range of motion.  Neurological: She is oriented to person, place, and time.  Skin: Skin is warm and dry.  Psychiatric: She has a normal mood and affect. Her behavior is normal.  Vitals reviewed.   RECENT LABS AND TESTS: BMET    Component Value Date/Time   NA 144 01/09/2018 0838   K 4.3 01/09/2018 0838   CL 104 01/09/2018 0838   CO2 25 01/09/2018 0838   GLUCOSE 117 (H) 01/09/2018 0838   GLUCOSE 110 (H) 09/11/2017 0756   BUN 16 01/09/2018 0838   CREATININE 0.76 01/09/2018 0838   CREATININE 0.73 07/27/2015 0751   CALCIUM 9.5 01/09/2018 0838   GFRNONAA 74 01/09/2018 0838   GFRNONAA 80 07/27/2015 0751   GFRAA 86 01/09/2018 0838   GFRAA >89 07/27/2015 0751   Lab Results  Component Value Date   HGBA1C 6.0 (H) 01/09/2018   HGBA1C 5.8 09/18/2017   HGBA1C 6.8 (H) 05/15/2017   HGBA1C 6.8 (H) 02/15/2017   HGBA1C 6.3 08/23/2016   Lab Results  Component Value Date   INSULIN 12.7 01/09/2018   INSULIN 16.4 05/15/2017   CBC    Component Value Date/Time   WBC 7.6 09/11/2017 0756   RBC 4.18 09/11/2017 0756   HGB 13.2  09/11/2017 0756   HCT 39.5 09/11/2017 0756   PLT 168.0 09/11/2017 0756   MCV 94.6 09/11/2017 0756   MCH 31.9 04/03/2015 1443   MCHC 33.5 09/11/2017 0756   RDW 13.2 09/11/2017 0756   LYMPHSABS 1.4 05/07/2015 1159   MONOABS 0.8 05/07/2015 1159   EOSABS 0.1 05/07/2015 1159   BASOSABS 0.1 05/07/2015 1159   Iron/TIBC/Ferritin/ %Sat    Component Value Date/Time   IRON 101 01/24/2011 2029   FERRITIN 171 01/24/2011 2029   Lipid Panel     Component Value Date/Time   CHOL 175 01/09/2018 0838   TRIG 147 01/09/2018 0838   HDL 47 01/09/2018 0838   CHOLHDL 4 09/11/2017 0756   VLDL 27.4 09/11/2017 0756   LDLCALC 99 01/09/2018 0838   Hepatic Function Panel     Component Value Date/Time   PROT 7.0 01/09/2018 0838   ALBUMIN 3.8 01/09/2018 0838   AST 24 01/09/2018 0838   ALT 12 01/09/2018 0838   ALKPHOS 128 (H) 01/09/2018 0838   BILITOT 0.6 01/09/2018 0838   BILIDIR 0.3 01/19/2015 0751   IBILI 0.9 06/24/2014 0847      Component Value Date/Time   TSH 6.170 (H) 01/09/2018 0838   TSH 3.19 09/11/2017 0756   TSH 3.10 02/15/2017 0712    ASSESSMENT AND PLAN: Type 2 diabetes mellitus without complication, without long-term current use of insulin (HCC) - Plan: Insulin, random, Hemoglobin A1c, Comprehensive metabolic panel  Vitamin D deficiency - Plan: VITAMIN D 25 Hydroxy (Vit-D Deficiency, Fractures)  Other hyperlipidemia - Plan: Lipid Panel With LDL/HDL Ratio  Essential hypertension  Hair loss - Plan: T3, T4, free, TSH  Class 1 obesity with serious comorbidity and body mass index (BMI) of 32.0 to 32.9 in adult, unspecified obesity type  PLAN:  Diabetes II Berlie has been given extensive diabetes education by myself today including ideal fasting and post-prandial blood glucose readings, individual ideal Hgb A1c goals and hypoglycemia prevention. We discussed the importance of good blood sugar control to decrease the likelihood  of diabetic complications such as nephropathy,  neuropathy, limb loss, blindness, coronary artery disease, and death. We discussed the importance of intensive lifestyle modification including diet, exercise and weight loss as the first line treatment for diabetes. Yanil agrees to continue her diabetes medications as prescribed. We will check labs and Gretel agrees to follow up with our clinic in 3 weeks.  Vitamin D Deficiency Halleigh was informed that low vitamin D levels contributes to fatigue and are associated with obesity, breast, and colon cancer. Margarethe agrees to discontinue prescription Vit D @50 ,000 IU every week #4 and will follow up for routine testing of vitamin D, at least 2-3 times per year. She was informed of the risk of over-replacement of vitamin D and agrees to not increase her dose unless she discusses this with [name] first. We will check labs and Oluwademilade agrees to follow up with our clinic in 3 weeks.  Hyperlipidemia Yuka was informed of the American Heart Association Guidelines emphasizing intensive lifestyle modifications as the first line treatment for hyperlipidemia. We discussed many lifestyle modifications today in depth, and Floride will continue to work on decreasing saturated fats such as fatty red meat, butter and many fried foods. She will also increase vegetables and lean protein in her diet and continue to work on exercise and weight loss efforts. She will continue taking her medications as prescribed. We will check labs and Marvell agrees to follow up with our clinic in 3 weeks.  Hypertension We discussed sodium restriction, working on healthy weight loss, and a regular exercise program as the means to achieve improved blood pressure control. Toniya agreed with this plan and agreed to follow up as directed. We will continue to monitor her blood pressure as well as her progress with the above lifestyle modifications. She will continue her medications as prescribed and will watch for signs of hypotension as she continues her lifestyle  modifications. We will check labs and Karyn agrees to follow up with our clinic in 3 weeks.  Hair loss Will recheck thyroid panel.  Obesity Jenniferlynn is currently in the action stage of change. As such, her goal is to continue with weight loss efforts She has agreed to keep a food journal with 1200 calories and 85 grams of protein daily Misbah has been instructed to work up to a goal of 150 minutes of combined cardio and strengthening exercise per week for weight loss and overall health benefits. We discussed the following Behavioral Modification Strategies today: increasing lean protein intake and work on meal planning and easy cooking plans   Shweta has agreed to follow up with our clinic in 3 weeks. She was informed of the importance of frequent follow up visits to maximize her success with intensive lifestyle modifications for her multiple health conditions.    OBESITY BEHAVIORAL INTERVENTION VISIT  Today's visit was # 11 out of 22.  Starting weight: 223 lbs Starting date: 05/15/17 Today's weight : 178 lbs  Today's date: 01/10/2018 Total lbs lost to date: 66 (Patients must lose 7 lbs in the first 6 months to continue with counseling)   ASK: We discussed the diagnosis of obesity with Marquita Palms today and Raneisha agreed to give [name] permission to discuss obesity behavioral modification therapy today.  ASSESS: Jaylene has the diagnosis of obesity and her BMI today is 32.55 Shakiera is in the action stage of change   ADVISE: Rosy was educated on the multiple health risks of obesity as well as the benefit of weight loss to  improve her health. She was advised of the need for long term treatment and the importance of lifestyle modifications.  AGREE: Multiple dietary modification options and treatment options were discussed and  Cynethia agreed to the above obesity treatment plan.   Wilhemena Durie, am acting as transcriptionist for Lacy Duverney, [place] [facility] have reviewed this  note and agree with its contents  I have reviewed the above documentation for accuracy and completeness, and I agree with the above. -Dennard Nip, MD

## 2018-01-10 LAB — LIPID PANEL WITH LDL/HDL RATIO
CHOLESTEROL TOTAL: 175 mg/dL (ref 100–199)
HDL: 47 mg/dL (ref >39)
LDL CALC: 99 mg/dL (ref 0–99)
LDL/HDL RATIO: 2.1 ratio (ref 0.0–3.2)
Triglycerides: 147 mg/dL (ref 0–149)
VLDL CHOLESTEROL CAL: 29 mg/dL (ref 5–40)

## 2018-01-10 LAB — T4, FREE: FREE T4: 0.92 ng/dL (ref 0.82–1.77)

## 2018-01-10 LAB — COMPREHENSIVE METABOLIC PANEL
A/G RATIO: 1.2 (ref 1.2–2.2)
ALT: 12 IU/L (ref 0–32)
AST: 24 IU/L (ref 0–40)
Albumin: 3.8 g/dL (ref 3.5–4.7)
Alkaline Phosphatase: 128 IU/L — ABNORMAL HIGH (ref 39–117)
BILIRUBIN TOTAL: 0.6 mg/dL (ref 0.0–1.2)
BUN / CREAT RATIO: 21 (ref 12–28)
BUN: 16 mg/dL (ref 8–27)
CO2: 25 mmol/L (ref 20–29)
Calcium: 9.5 mg/dL (ref 8.7–10.3)
Chloride: 104 mmol/L (ref 96–106)
Creatinine, Ser: 0.76 mg/dL (ref 0.57–1.00)
GFR, EST AFRICAN AMERICAN: 86 mL/min/{1.73_m2} (ref >59)
GFR, EST NON AFRICAN AMERICAN: 74 mL/min/{1.73_m2} (ref >59)
GLOBULIN, TOTAL: 3.2 g/dL (ref 1.5–4.5)
Glucose: 117 mg/dL — ABNORMAL HIGH (ref 65–99)
POTASSIUM: 4.3 mmol/L (ref 3.5–5.2)
SODIUM: 144 mmol/L (ref 134–144)
TOTAL PROTEIN: 7 g/dL (ref 6.0–8.5)

## 2018-01-10 LAB — INSULIN, RANDOM: INSULIN: 12.7 u[IU]/mL (ref 2.6–24.9)

## 2018-01-10 LAB — VITAMIN D 25 HYDROXY (VIT D DEFICIENCY, FRACTURES): Vit D, 25-Hydroxy: 41.6 ng/mL (ref 30.0–100.0)

## 2018-01-10 LAB — HEMOGLOBIN A1C
Est. average glucose Bld gHb Est-mCnc: 126 mg/dL
Hgb A1c MFr Bld: 6 % — ABNORMAL HIGH (ref 4.8–5.6)

## 2018-01-10 LAB — TSH: TSH: 6.17 u[IU]/mL — ABNORMAL HIGH (ref 0.450–4.500)

## 2018-01-10 LAB — T3: T3 TOTAL: 102 ng/dL (ref 71–180)

## 2018-01-11 DIAGNOSIS — N281 Cyst of kidney, acquired: Secondary | ICD-10-CM | POA: Diagnosis not present

## 2018-01-11 DIAGNOSIS — N393 Stress incontinence (female) (male): Secondary | ICD-10-CM | POA: Diagnosis not present

## 2018-01-30 ENCOUNTER — Ambulatory Visit (INDEPENDENT_AMBULATORY_CARE_PROVIDER_SITE_OTHER): Payer: Medicare HMO | Admitting: Physician Assistant

## 2018-01-30 VITALS — BP 116/71 | HR 88 | Temp 97.6°F | Ht 62.0 in | Wt 179.0 lb

## 2018-01-30 DIAGNOSIS — E039 Hypothyroidism, unspecified: Secondary | ICD-10-CM

## 2018-01-30 DIAGNOSIS — E119 Type 2 diabetes mellitus without complications: Secondary | ICD-10-CM

## 2018-01-30 DIAGNOSIS — Z6832 Body mass index (BMI) 32.0-32.9, adult: Secondary | ICD-10-CM

## 2018-01-30 DIAGNOSIS — E038 Other specified hypothyroidism: Secondary | ICD-10-CM | POA: Insufficient documentation

## 2018-01-30 DIAGNOSIS — E669 Obesity, unspecified: Secondary | ICD-10-CM | POA: Diagnosis not present

## 2018-01-30 NOTE — Progress Notes (Signed)
Office: [phone]  /  Fax: [phone]   HPI:   Chief Complaint: OBESITY Stacey Cooper is here to discuss Stacey Cooper progress with Stacey Cooper obesity treatment plan. She is on the keep a food journal with 1200 calories and 85 grams of protein daily and is following Stacey Cooper eating plan approximately 0 % of the time. She states she is walking 45 minutes 5 to 7 times per week. Stacey Cooper has been sick with an upper respiratory infection and has not been following the meal plan, as she is eating more soups. Stacey Cooper requests a longer follow up time, due to insurance issues. Stacey Cooper weight is 179 lb (81.2 kg) today and has had a weight gain of 1 pound over a period of 6 weeks since Stacey Cooper last visit. She has lost 44 lbs since starting treatment with [name].  Diabetes II Stacey Cooper has a diagnosis of diabetes type II. Stacey Cooper states fasting BGs range between 100 and 110's and denies any hypoglycemic episodes. Last A1c improved to 6.0 She has been working on intensive lifestyle modifications including diet, exercise, and weight loss to help control Stacey Cooper blood glucose levels. Stacey Cooper is currently on Victoza.  Subclinical Hypothyroidism Stacey Cooper has a diagnosis of subclinical hypothyroidism. Stacey Cooper T3 and T4 was within normal limits. Stacey Cooper admits to dry skin, constipation and thinning of Stacey Cooper hair. She is not on levothyroxine.   ALLERGIES: Allergies  Allergen Reactions  . Contrast Media [Iodinated Diagnostic Agents] Shortness Of Breath  . Lovastatin     myalgias    MEDICATIONS: Current Outpatient Medications on File Prior to Visit  Medication Sig Dispense Refill  . furosemide (LASIX) 20 MG tablet Take 1 tablet (20 mg total) by mouth daily. 90 tablet 0  . glucose blood (FREESTYLE LITE) test strip USE TO TEST BLOOD SUGAR TWICE DAILY AS DIRECTED 100 each 6  . Insulin Pen Needle (NOVOFINE) 32G X 6 MM MISC Use with Victoza 100 each 6  . Lancets MISC by Does not apply route. Check blood sugars qam and once daily prn     . liraglutide (VICTOZA) 18 MG/3ML  SOPN Inject 0.3 mLs (1.8 mg total) into the skin daily. (Patient taking differently: Inject 0.9 mg into the skin daily. ) 9 mL 3  . lisinopril (PRINIVIL,ZESTRIL) 5 MG tablet TAKE 1 TABLET(5 MG) BY MOUTH DAILY 90 tablet 1  . rosuvastatin (CRESTOR) 5 MG tablet TAKE ONE TABLET EVERY OTHER DAY. 30 tablet 1  . Vitamin D, Ergocalciferol, (DRISDOL) 50000 units CAPS capsule Take 1 capsule (50,000 Units total) by mouth as directed. Take one tab every 14 days 4 capsule 0   No current facility-administered medications on file prior to visit.     PAST MEDICAL HISTORY: Past Medical History:  Diagnosis Date  . ABDOMINAL PAIN, RIGHT UPPER QUADRANT, HX OF 01/24/2011  . Actinic keratoses 07/18/2011  . Arthralgia 05/03/2015  . Back pain 12/08/2011  . Basal cell carcinoma of skin of lip 01/24/2011  . Blood transfusion without reported diagnosis   . Bronchitis 12/20/2012  . Cataract    bilataeral cateracts removed  . CHOLELITHIASIS, HX OF 02/07/2011  . Chronic kidney disease   . Diabetes mellitus   . Diabetes mellitus type 2 in obese [facility]) 01/24/2011   Qualifier: Diagnosis of  By: [name], [name]  Annual eye exam with [facility] on 06/14/13   . DM 01/24/2011  . Essential hypertension, benign 01/24/2011  . Fatty liver   . GERD 01/24/2011  . Grief reaction 01/20/2014  . HAIR LOSS 01/24/2011  .  Hearing loss of both ears 06/28/2013  . Hepatic cirrhosis ([place]) 04/27/2015  . Hernia, abdominal   . Hyperlipidemia 04/24/2012  . Hyperlipidemia, mixed 04/24/2012  . Insomnia 07/24/2012  . Joint pain   . Leg cramps 06/06/2013  . MEASLES, HX OF 01/24/2011  . Morbid obesity ([place]) 01/24/2011  . NONSPEC ELEVATION OF LEVELS OF TRANSAMINASE/LDH 02/07/2011  . Peripheral neuropathy 04/20/2011  . Preventative health care 08/23/2016  . Right hip pain 05/03/2015  . SCC (squamous cell carcinoma), face 07/18/2011  . SQUAMOUS CELL CARCINOMA OTHER SPEC SITES SKIN 01/24/2011  . Swelling   . Tubular adenoma of colon 04/2015  . Umbilical  hernia 2/37/6283  . URINARY INCONTINENCE 02/07/2011  . Urinary tract infection, site not specified 04/30/2014  . UTI'S, HX OF 01/24/2011  . Vulvar atrophy 03/17/2017    PAST SURGICAL HISTORY: Past Surgical History:  Procedure Laterality Date  . ABDOMINAL HYSTERECTOMY     partial, both ovaries left in place  . BCC removed below lower lip on left  26 yrs ago  . cataract extraction, b/l    . COLONOSCOPY    . Left knee arthroscopy and cleaning prior to TKR    . skin bx, right anterior CW , squamous cancer, had to redo to margins  12/2010  . total knee replacement, b/l      SOCIAL HISTORY: Social History   Tobacco Use  . Smoking status: Never Smoker  . Smokeless tobacco: Never Used  Substance Use Topics  . Alcohol use: No    Alcohol/week: 0.0 oz  . Drug use: No    FAMILY HISTORY: Family History  Problem Relation Age of Onset  . Arthritis Mother        RA  . Myelodysplastic syndrome Mother   . Heart disease Mother   . Cancer Father        skin  . Stroke Father   . Colon polyps Father        68 in [place] intes removed carcinoid tumor confined in the tumor  . Stroke Maternal Grandmother   . Kidney failure Maternal Grandfather   . Esophageal cancer Neg Hx   . Rectal cancer Neg Hx   . Stomach cancer Neg Hx     ROS: Review of Systems  Constitutional: Negative for weight loss.  HENT:       Positive Hair Thinning  Gastrointestinal: Positive for constipation.  Skin:       Positive Dry Skin  Endo/Heme/Allergies:       Negative hypoglycemia    PHYSICAL EXAM: Blood pressure 116/71, pulse 88, temperature 97.6 F (36.4 C), temperature source Oral, height 5\' 2"  (1.575 m), weight 179 lb (81.2 kg), SpO2 98 %. Body mass index is 32.74 kg/m. Physical Exam  Constitutional: She is oriented to person, place, and time. She appears well-developed and well-nourished.  Cardiovascular: Normal rate.  Pulmonary/Chest: Effort normal.  Musculoskeletal: Normal range of motion.    Neurological: She is oriented to person, place, and time.  Skin: Skin is warm and dry.  Psychiatric: She has a normal mood and affect. Stacey Cooper behavior is normal.  Vitals reviewed.   RECENT LABS AND TESTS: BMET    Component Value Date/Time   NA 144 01/09/2018 0838   K 4.3 01/09/2018 0838   CL 104 01/09/2018 0838   CO2 25 01/09/2018 0838   GLUCOSE 117 (H) 01/09/2018 0838   GLUCOSE 110 (H) 09/11/2017 0756   BUN 16 01/09/2018 0838   CREATININE 0.76 01/09/2018 0838   CREATININE  0.73 07/27/2015 0751   CALCIUM 9.5 01/09/2018 0838   GFRNONAA 74 01/09/2018 0838   GFRNONAA 80 07/27/2015 0751   GFRAA 86 01/09/2018 0838   GFRAA >89 07/27/2015 0751   Lab Results  Component Value Date   HGBA1C 6.0 (H) 01/09/2018   HGBA1C 5.8 09/18/2017   HGBA1C 6.8 (H) 05/15/2017   HGBA1C 6.8 (H) 02/15/2017   HGBA1C 6.3 08/23/2016   Lab Results  Component Value Date   INSULIN 12.7 01/09/2018   INSULIN 16.4 05/15/2017   CBC    Component Value Date/Time   WBC 7.6 09/11/2017 0756   RBC 4.18 09/11/2017 0756   HGB 13.2 09/11/2017 0756   HCT 39.5 09/11/2017 0756   PLT 168.0 09/11/2017 0756   MCV 94.6 09/11/2017 0756   MCH 31.9 04/03/2015 1443   MCHC 33.5 09/11/2017 0756   RDW 13.2 09/11/2017 0756   LYMPHSABS 1.4 05/07/2015 1159   MONOABS 0.8 05/07/2015 1159   EOSABS 0.1 05/07/2015 1159   BASOSABS 0.1 05/07/2015 1159   Iron/TIBC/Ferritin/ %Sat    Component Value Date/Time   IRON 101 01/24/2011 2029   FERRITIN 171 01/24/2011 2029   Lipid Panel     Component Value Date/Time   CHOL 175 01/09/2018 0838   TRIG 147 01/09/2018 0838   HDL 47 01/09/2018 0838   CHOLHDL 4 09/11/2017 0756   VLDL 27.4 09/11/2017 0756   LDLCALC 99 01/09/2018 0838   Hepatic Function Panel     Component Value Date/Time   PROT 7.0 01/09/2018 0838   ALBUMIN 3.8 01/09/2018 0838   AST 24 01/09/2018 0838   ALT 12 01/09/2018 0838   ALKPHOS 128 (H) 01/09/2018 0838   BILITOT 0.6 01/09/2018 0838   BILIDIR 0.3  01/19/2015 0751   IBILI 0.9 06/24/2014 0847      Component Value Date/Time   TSH 6.170 (H) 01/09/2018 0838   TSH 3.19 09/11/2017 0756   TSH 3.10 02/15/2017 0712    ASSESSMENT AND PLAN: Type 2 diabetes mellitus without complication, without long-term current use of insulin (HCC)  Subclinical hypothyroidism  Class 1 obesity with serious comorbidity and body mass index (BMI) of 32.0 to 32.9 in adult, unspecified obesity type  PLAN:  Diabetes II Katheen has been given extensive diabetes education by myself today including ideal fasting and post-prandial blood glucose readings, individual ideal Hgb A1c goals and hypoglycemia prevention. We discussed the importance of good blood sugar control to decrease the likelihood of diabetic complications such as nephropathy, neuropathy, limb loss, blindness, coronary artery disease, and death. We discussed the importance of intensive lifestyle modification including diet, exercise and weight loss as the first line treatment for diabetes. Kahli agrees to continue Stacey Cooper diabetes medications and will follow up at the agreed upon time.  Subclinical Hypothyroidism Alaiya was informed of the importance of good thyroid control to help with weight loss efforts. She was also informed that supertheraputic thyroid levels are dangerous and will not improve weight loss results. Gwendloyn will re-evaluate thyroid panel at Stacey Cooper next PCP visit.  We spent > than 50% of the 15 minute visit on the counseling as documented in the note.  Obesity Alexxa is currently in the action stage of change. As such, Stacey Cooper goal is to continue with weight loss efforts She has agreed to follow the Category 2 plan Antwonette has been instructed to work up to a goal of 150 minutes of combined cardio and strengthening exercise per week for weight loss and overall health benefits. We discussed the following Behavioral  Modification Strategies today: increasing lean protein intake and planning for  success  Lailynn has agreed to follow up with our clinic in 6 weeks. She was informed of the importance of frequent follow up visits to maximize Stacey Cooper success with intensive lifestyle modifications for Stacey Cooper multiple health conditions.   OBESITY BEHAVIORAL INTERVENTION VISIT  Today's visit was # 12 out of 22.  Starting weight: 223 lbs Starting date: 05/15/17 Today's weight : 179 lbs Today's date: 01/30/2018 Total lbs lost to date: 43 (Patients must lose 7 lbs in the first 6 months to continue with counseling)   ASK: We discussed the diagnosis of obesity with Marquita Palms today and Bridie agreed to give [name] permission to discuss obesity behavioral modification therapy today.  ASSESS: Savvy has the diagnosis of obesity and Stacey Cooper BMI today is 32.73 Takeyla is in the action stage of change   ADVISE: Tiffny was educated on the multiple health risks of obesity as well as the benefit of weight loss to improve Stacey Cooper health. She was advised of the need for long term treatment and the importance of lifestyle modifications.  AGREE: Multiple dietary modification options and treatment options were discussed and  Natali agreed to the above obesity treatment plan.   Corey Skains, am acting as transcriptionist for [employer], PA-C I, [facility], have reviewed this note and agree with its content.

## 2018-02-13 ENCOUNTER — Telehealth: Payer: Self-pay | Admitting: Family Medicine

## 2018-02-13 NOTE — Telephone Encounter (Signed)
Copied from [place]. Topic: Quick Communication - Rx Refill/Question >> Feb 13, 2018  4:27 PM Margot Ables wrote: Pt states she needs RX for new meter and supplies sent to pharmacy - [facility] to [place]. [facility] which is what [facility] currently [facility].  [id number] [place], [place] (Phone) [phone] (Fax)

## 2018-02-15 DIAGNOSIS — H353131 Nonexudative age-related macular degeneration, bilateral, early dry stage: Secondary | ICD-10-CM | POA: Diagnosis not present

## 2018-02-15 DIAGNOSIS — H40013 Open angle with borderline findings, low risk, bilateral: Secondary | ICD-10-CM | POA: Diagnosis not present

## 2018-02-15 DIAGNOSIS — H04123 Dry eye syndrome of bilateral lacrimal glands: Secondary | ICD-10-CM | POA: Diagnosis not present

## 2018-02-15 DIAGNOSIS — E119 Type 2 diabetes mellitus without complications: Secondary | ICD-10-CM | POA: Diagnosis not present

## 2018-02-15 LAB — HM DIABETES EYE EXAM

## 2018-02-19 DIAGNOSIS — R69 Illness, unspecified: Secondary | ICD-10-CM | POA: Diagnosis not present

## 2018-02-19 MED ORDER — PRODIGY AUTOCODE BLOOD GLUCOSE W/DEVICE KIT: 1.0000 | PACK | Freq: Three times a day (TID) | 0 refills | Status: AC | PRN

## 2018-02-19 MED ORDER — GLUCOSE BLOOD VI STRP: ORAL_STRIP | 6 refills | Status: DC

## 2018-02-19 MED ORDER — INSULIN PEN NEEDLE 32G X 6 MM MISC: 6 refills | Status: DC

## 2018-02-19 NOTE — Telephone Encounter (Signed)
Rx sent 

## 2018-02-28 DIAGNOSIS — Z85828 Personal history of other malignant neoplasm of skin: Secondary | ICD-10-CM | POA: Diagnosis not present

## 2018-02-28 DIAGNOSIS — L720 Epidermal cyst: Secondary | ICD-10-CM | POA: Diagnosis not present

## 2018-02-28 DIAGNOSIS — L821 Other seborrheic keratosis: Secondary | ICD-10-CM | POA: Diagnosis not present

## 2018-02-28 DIAGNOSIS — L570 Actinic keratosis: Secondary | ICD-10-CM | POA: Diagnosis not present

## 2018-03-12 ENCOUNTER — Ambulatory Visit (INDEPENDENT_AMBULATORY_CARE_PROVIDER_SITE_OTHER): Payer: Medicare HMO | Admitting: Family Medicine

## 2018-03-12 DIAGNOSIS — E119 Type 2 diabetes mellitus without complications: Secondary | ICD-10-CM | POA: Diagnosis not present

## 2018-03-12 DIAGNOSIS — R7989 Other specified abnormal findings of blood chemistry: Secondary | ICD-10-CM | POA: Diagnosis not present

## 2018-03-12 DIAGNOSIS — E559 Vitamin D deficiency, unspecified: Secondary | ICD-10-CM

## 2018-03-12 DIAGNOSIS — I10 Essential (primary) hypertension: Secondary | ICD-10-CM

## 2018-03-12 DIAGNOSIS — E782 Mixed hyperlipidemia: Secondary | ICD-10-CM

## 2018-03-12 MED ORDER — ROSUVASTATIN CALCIUM 5 MG PO TABS: ORAL_TABLET | ORAL | 3 refills | Status: DC

## 2018-03-12 MED ORDER — METFORMIN HCL 500 MG PO TABS: 1000.0000 mg | ORAL_TABLET | Freq: Two times a day (BID) | ORAL | 3 refills | Status: DC

## 2018-03-12 NOTE — Assessment & Plan Note (Signed)
Well controlled, no changes to meds. Encouraged heart healthy diet such as the DASH diet and exercise as tolerated.  °

## 2018-03-12 NOTE — Assessment & Plan Note (Signed)
Check labs with next blood draw

## 2018-03-12 NOTE — Assessment & Plan Note (Signed)
Tolerating statin, encouraged heart healthy diet, avoid trans fats, minimize simple carbs and saturated fats. Increase exercise as tolerated 

## 2018-03-12 NOTE — Assessment & Plan Note (Signed)
hgba1c acceptable, minimize simple carbs. Increase exercise as tolerated. Continue current meds 

## 2018-03-12 NOTE — Patient Instructions (Signed)

## 2018-03-12 NOTE — Progress Notes (Signed)
Subjective:  I acted as a Education administrator for Dr. Charlett Blake. [place], [place]  Patient ID: Stacey Cooper, female    DOB: December 14, 1935, 83 y.o.   MRN: [id number]  No chief complaint on file.   HPI  Patient is in today for 6 month follow up and she is doing well. No recent febrile illness or hospitalization. No c/o polyuria or polydipsia. Is trying to eat well and minimize processed food. Had to stop with the bariatric service due to cost. Is also requesting to come off of Victoza due to cost. Denies CP/palp/SOB/HA/congestion/fevers/GI or GU c/o. Taking meds as prescribed  Patient Care Team: Mosie Lukes, MD as PCP - General (Family Medicine) Monna Fam, MD as Consulting Physician (Ophthalmology) Melrose Nakayama, MD as Consulting Physician (Orthopedic Surgery)   Past Medical History:  Diagnosis Date  . ABDOMINAL PAIN, RIGHT UPPER QUADRANT, HX OF 01/24/2011  . Actinic keratoses 07/18/2011  . Arthralgia 05/03/2015  . Back pain 12/08/2011  . Basal cell carcinoma of skin of lip 01/24/2011  . Blood transfusion without reported diagnosis   . Bronchitis 12/20/2012  . Cataract    bilataeral cateracts removed  . CHOLELITHIASIS, HX OF 02/07/2011  . Chronic kidney disease   . Diabetes mellitus   . Diabetes mellitus type 2 in obese [facility]) 01/24/2011   Qualifier: Diagnosis of  By: [name], [name]  Annual eye exam with [facility] on 06/14/13   . DM 01/24/2011  . Essential hypertension, benign 01/24/2011  . Fatty liver   . GERD 01/24/2011  . Grief reaction 01/20/2014  . HAIR LOSS 01/24/2011  . Hearing loss of both ears 06/28/2013  . Hepatic cirrhosis ([place]) 04/27/2015  . Hernia, abdominal   . Hyperlipidemia 04/24/2012  . Hyperlipidemia, mixed 04/24/2012  . Insomnia 07/24/2012  . Joint pain   . Leg cramps 06/06/2013  . MEASLES, HX OF 01/24/2011  . Morbid obesity ([place]) 01/24/2011  . NONSPEC ELEVATION OF LEVELS OF TRANSAMINASE/LDH 02/07/2011  . Peripheral neuropathy 04/20/2011  . Preventative health care  08/23/2016  . Right hip pain 05/03/2015  . SCC (squamous cell carcinoma), face 07/18/2011  . SQUAMOUS CELL CARCINOMA OTHER SPEC SITES SKIN 01/24/2011  . Swelling   . Tubular adenoma of colon 04/2015  . Umbilical hernia 7/90/2409  . URINARY INCONTINENCE 02/07/2011  . Urinary tract infection, site not specified 04/30/2014  . UTI'S, HX OF 01/24/2011  . Vulvar atrophy 03/17/2017    Past Surgical History:  Procedure Laterality Date  . ABDOMINAL HYSTERECTOMY     partial, both ovaries left in place  . BCC removed below lower lip on left  26 yrs ago  . cataract extraction, b/l    . COLONOSCOPY    . Left knee arthroscopy and cleaning prior to TKR    . skin bx, right anterior CW , squamous cancer, had to redo to margins  12/2010  . total knee replacement, b/l      Family History  Problem Relation Age of Onset  . Arthritis Mother        RA  . Myelodysplastic syndrome Mother   . Heart disease Mother   . Cancer Father        skin  . Stroke Father   . Colon polyps Father        56 in [place] intes removed carcinoid tumor confined in the tumor  . Stroke Maternal Grandmother   . Kidney failure Maternal Grandfather   . Esophageal cancer Neg Hx   . Rectal cancer Neg Hx   .  Stomach cancer Neg Hx     Social History   Socioeconomic History  . Marital status: Widowed    Spouse name: Not on file  . Number of children: Not on file  . Years of education: Not on file  . Highest education level: Not on file  Social Needs  . Financial resource strain: Not on file  . Food insecurity - worry: Not on file  . Food insecurity - inability: Not on file  . Transportation needs - medical: Not on file  . Transportation needs - non-medical: Not on file  Occupational History  . Occupation: retired  Tobacco Use  . Smoking status: Never Smoker  . Smokeless tobacco: Never Used  Substance and Sexual Activity  . Alcohol use: No    Alcohol/week: 0.0 oz  . Drug use: No  . Sexual activity: Yes  Other Topics  Concern  . Not on file  Social History Narrative  . Not on file    Outpatient Medications Prior to Visit  Medication Sig Dispense Refill  . Blood Glucose Monitoring Suppl (PRODIGY AUTOCODE BLOOD GLUCOSE) w/Device KIT 1 Device by Does not apply route 3 (three) times daily between meals as needed. 1 each 0  . furosemide (LASIX) 20 MG tablet Take 1 tablet (20 mg total) by mouth daily. 90 tablet 0  . glucose blood (FREESTYLE LITE) test strip USE TO TEST BLOOD SUGAR TWICE DAILY AS DIRECTED 100 each 6  . Insulin Pen Needle (NOVOFINE) 32G X 6 MM MISC Use with Victoza 100 each 6  . Lancets MISC by Does not apply route. Check blood sugars qam and once daily prn     . lisinopril (PRINIVIL,ZESTRIL) 5 MG tablet TAKE 1 TABLET(5 MG) BY MOUTH DAILY 90 tablet 1  . Vitamin D, Ergocalciferol, (DRISDOL) 50000 units CAPS capsule Take 1 capsule (50,000 Units total) by mouth as directed. Take one tab every 14 days 4 capsule 0  . liraglutide (VICTOZA) 18 MG/3ML SOPN Inject 0.3 mLs (1.8 mg total) into the skin daily. (Patient taking differently: Inject 0.9 mg into the skin daily. ) 9 mL 3  . metFORMIN (GLUCOPHAGE) 500 MG tablet Take 2 tablets by mouth at bedtime.  0  . rosuvastatin (CRESTOR) 5 MG tablet TAKE ONE TABLET EVERY OTHER DAY. (Patient not taking: Reported on 03/12/2018) 30 tablet 1   No facility-administered medications prior to visit.     Allergies  Allergen Reactions  . Contrast Media [Iodinated Diagnostic Agents] Shortness Of Breath  . Lovastatin     myalgias    Review of Systems  Constitutional: Negative for fever and malaise/fatigue.  HENT: Negative for congestion.   Eyes: Negative for blurred vision.  Respiratory: Negative for shortness of breath.   Cardiovascular: Negative for chest pain, palpitations and leg swelling.  Gastrointestinal: Negative for abdominal pain, blood in stool and nausea.  Genitourinary: Negative for dysuria and frequency.  Musculoskeletal: Negative for falls.    Skin: Negative for rash.  Neurological: Negative for dizziness, loss of consciousness and headaches.  Endo/Heme/Allergies: Negative for environmental allergies.  Psychiatric/Behavioral: Negative for depression. The patient is not nervous/anxious.        Objective:    Physical Exam  Constitutional: She is oriented to person, place, and time. She appears well-developed and well-nourished. No distress.  HENT:  Head: Normocephalic and atraumatic.  Nose: Nose normal.  Eyes: Right eye exhibits no discharge. Left eye exhibits no discharge.  Neck: Normal range of motion. Neck supple.  Cardiovascular: Normal rate and regular rhythm.  No murmur heard. Pulmonary/Chest: Effort normal and breath sounds normal.  Abdominal: Soft. Bowel sounds are normal. There is no tenderness.  Musculoskeletal: She exhibits no edema.  Neurological: She is alert and oriented to person, place, and time.  Skin: Skin is warm and dry.  Psychiatric: She has a normal mood and affect.  Nursing note and vitals reviewed.   BP 112/70 (BP Location: Left Arm, Patient Position: Sitting, Cuff Size: Normal)   Pulse 73   Temp (!) 97.5 F (36.4 C) (Oral)   Resp 18   Wt 186 lb 6.4 oz (84.6 kg)   SpO2 97%   BMI 34.09 kg/m  Wt Readings from Last 3 Encounters:  03/12/18 186 lb 6.4 oz (84.6 kg)  01/30/18 179 lb (81.2 kg)  01/09/18 178 lb (80.7 kg)   BP Readings from Last 3 Encounters:  03/12/18 112/70  01/30/18 116/71  01/09/18 106/67     Immunization History  Administered Date(s) Administered  . Influenza Split 10/20/2011, 11/29/2012  . Influenza Whole 08/26/2010  . Influenza, High Dose Seasonal PF 08/23/2016  . Influenza,inj,Quad PF,6+ Mos 09/05/2013, 08/28/2014, 11/12/2015  . Pneumococcal Conjugate-13 08/28/2014  . Pneumococcal Polysaccharide-23 11/12/2015    Health Maintenance  Topic Date Due  . TETANUS/TDAP  09/07/1956  . INFLUENZA VACCINE  07/26/2017  . FOOT EXAM  08/08/2017  . OPHTHALMOLOGY EXAM   02/14/2018  . HEMOGLOBIN A1C  07/09/2018  . DEXA SCAN  Completed  . PNA vac Low Risk Adult  Completed    Lab Results  Component Value Date   WBC 7.6 09/11/2017   HGB 13.2 09/11/2017   HCT 39.5 09/11/2017   PLT 168.0 09/11/2017   GLUCOSE 117 (H) 01/09/2018   CHOL 175 01/09/2018   TRIG 147 01/09/2018   HDL 47 01/09/2018   LDLCALC 99 01/09/2018   ALT 12 01/09/2018   AST 24 01/09/2018   NA 144 01/09/2018   K 4.3 01/09/2018   CL 104 01/09/2018   CREATININE 0.76 01/09/2018   BUN 16 01/09/2018   CO2 25 01/09/2018   TSH 6.170 (H) 01/09/2018   INR 1.0 11/21/2016   HGBA1C 6.0 (H) 01/09/2018   MICROALBUR <0.7 11/12/2015    Lab Results  Component Value Date   TSH 6.170 (H) 01/09/2018   Lab Results  Component Value Date   WBC 7.6 09/11/2017   HGB 13.2 09/11/2017   HCT 39.5 09/11/2017   MCV 94.6 09/11/2017   PLT 168.0 09/11/2017   Lab Results  Component Value Date   NA 144 01/09/2018   K 4.3 01/09/2018   CO2 25 01/09/2018   GLUCOSE 117 (H) 01/09/2018   BUN 16 01/09/2018   CREATININE 0.76 01/09/2018   BILITOT 0.6 01/09/2018   ALKPHOS 128 (H) 01/09/2018   AST 24 01/09/2018   ALT 12 01/09/2018   PROT 7.0 01/09/2018   ALBUMIN 3.8 01/09/2018   CALCIUM 9.5 01/09/2018   GFR 79.02 09/11/2017   Lab Results  Component Value Date   CHOL 175 01/09/2018   Lab Results  Component Value Date   HDL 47 01/09/2018   Lab Results  Component Value Date   LDLCALC 99 01/09/2018   Lab Results  Component Value Date   TRIG 147 01/09/2018   Lab Results  Component Value Date   CHOLHDL 4 09/11/2017   Lab Results  Component Value Date   HGBA1C 6.0 (H) 01/09/2018         Assessment & Plan:   Problem List Items Addressed This Visit  Morbid obesity ([place])    Encouraged DASH diet, decrease po intake and increase exercise as tolerated. Needs 7-8 hours of sleep nightly. Avoid trans fats, eat small, frequent meals every 4-5 hours with lean proteins, complex carbs and healthy  fats. Minimize simple carbs. Tried Dr Leafy Ro but cannot afford the copay.      Relevant Medications   metFORMIN (GLUCOPHAGE) 500 MG tablet   ESSENTIAL HYPERTENSION, BENIGN    Well controlled, no changes to meds. Encouraged heart healthy diet such as the DASH diet and exercise as tolerated.       Relevant Orders   CBC   Comprehensive metabolic panel   TSH   Hyperlipidemia, mixed    Tolerating statin, encouraged heart healthy diet, avoid trans fats, minimize simple carbs and saturated fats. Increase exercise as tolerated      Relevant Orders   Lipid panel   Vitamin D deficiency    Check with next blood draw, encouraged 2000 IU daily      Type 2 diabetes mellitus without complication, without long-term current use of insulin (HCC)    hgba1c acceptable, minimize simple carbs. Increase exercise as tolerated. Continue current meds      Relevant Medications   metFORMIN (GLUCOPHAGE) 500 MG tablet   Other Relevant Orders   Hemoglobin A1c   Abnormal TSH    Check labs with next blood draw      Relevant Orders   TSH   T4, free      I have discontinued Stacey Cooper's liraglutide and rosuvastatin. I have also changed her metFORMIN. Additionally, I am having her maintain her Lancets, furosemide, lisinopril, Vitamin D (Ergocalciferol), glucose blood, Insulin Pen Needle, and Prodigy Autocode Blood Glucose.  Meds ordered this encounter  Medications  . metFORMIN (GLUCOPHAGE) 500 MG tablet    Sig: Take 2 tablets (1,000 mg total) by mouth 2 (two) times daily with a meal.    Dispense:  120 tablet    Refill:  3    CMA served as scribe during this visit. History, Physical and Plan performed by medical provider. Documentation and orders reviewed and attested to.  Penni Homans, MD

## 2018-03-12 NOTE — Assessment & Plan Note (Addendum)
Check with next blood draw, encouraged 2000 IU daily

## 2018-03-12 NOTE — Assessment & Plan Note (Signed)
Encouraged DASH diet, decrease po intake and increase exercise as tolerated. Needs 7-8 hours of sleep nightly. Avoid trans fats, eat small, frequent meals every 4-5 hours with lean proteins, complex carbs and healthy fats. Minimize simple carbs. Tried Dr Leafy Ro but cannot afford the copay.

## 2018-03-13 ENCOUNTER — Ambulatory Visit (INDEPENDENT_AMBULATORY_CARE_PROVIDER_SITE_OTHER): Payer: Medicare HMO | Admitting: Physician Assistant

## 2018-04-20 ENCOUNTER — Encounter: Payer: Self-pay | Admitting: Family Medicine

## 2018-04-20 DIAGNOSIS — M67911 Unspecified disorder of synovium and tendon, right shoulder: Secondary | ICD-10-CM | POA: Diagnosis not present

## 2018-04-20 DIAGNOSIS — M25511 Pain in right shoulder: Secondary | ICD-10-CM | POA: Diagnosis not present

## 2018-05-01 ENCOUNTER — Other Ambulatory Visit (INDEPENDENT_AMBULATORY_CARE_PROVIDER_SITE_OTHER): Payer: Medicare HMO

## 2018-05-01 DIAGNOSIS — I10 Essential (primary) hypertension: Secondary | ICD-10-CM

## 2018-05-01 DIAGNOSIS — R7989 Other specified abnormal findings of blood chemistry: Secondary | ICD-10-CM

## 2018-05-01 DIAGNOSIS — E782 Mixed hyperlipidemia: Secondary | ICD-10-CM

## 2018-05-01 DIAGNOSIS — E119 Type 2 diabetes mellitus without complications: Secondary | ICD-10-CM

## 2018-05-01 LAB — COMPREHENSIVE METABOLIC PANEL
ALT: 11 U/L (ref 0–35)
AST: 22 U/L (ref 0–37)
Albumin: 3.6 g/dL (ref 3.5–5.2)
Alkaline Phosphatase: 100 U/L (ref 39–117)
BUN: 22 mg/dL (ref 6–23)
CHLORIDE: 102 meq/L (ref 96–112)
CO2: 28 meq/L (ref 19–32)
Calcium: 9.5 mg/dL (ref 8.4–10.5)
Creatinine, Ser: 0.87 mg/dL (ref 0.40–1.20)
GFR: 66.48 mL/min (ref >60.00)
GLUCOSE: 125 mg/dL — AB (ref 70–99)
Potassium: 4.1 mEq/L (ref 3.5–5.1)
SODIUM: 138 meq/L (ref 135–145)
Total Bilirubin: 0.7 mg/dL (ref 0.2–1.2)
Total Protein: 7.1 g/dL (ref 6.0–8.3)

## 2018-05-01 LAB — CBC
HCT: 39.2 % (ref 36.0–46.0)
Hemoglobin: 13.2 g/dL (ref 12.0–15.0)
MCHC: 33.8 g/dL (ref 30.0–36.0)
MCV: 94.6 fl (ref 78.0–100.0)
Platelets: 248 10*3/uL (ref 150.0–400.0)
RBC: 4.14 Mil/uL (ref 3.87–5.11)
RDW: 13.6 % (ref 11.5–15.5)
WBC: 12.5 10*3/uL — ABNORMAL HIGH (ref 4.0–10.5)

## 2018-05-01 LAB — LIPID PANEL
CHOL/HDL RATIO: 3
Cholesterol: 142 mg/dL (ref 0–200)
HDL: 42.5 mg/dL (ref >39.00)
LDL CALC: 75 mg/dL (ref 0–99)
NonHDL: 99.69
Triglycerides: 121 mg/dL (ref 0.0–149.0)
VLDL: 24.2 mg/dL (ref 0.0–40.0)

## 2018-05-01 LAB — HEMOGLOBIN A1C: HEMOGLOBIN A1C: 6.5 % (ref 4.6–6.5)

## 2018-05-01 LAB — TSH: TSH: 5.19 u[IU]/mL — ABNORMAL HIGH (ref 0.35–4.50)

## 2018-05-01 LAB — T4, FREE: Free T4: 0.86 ng/dL (ref 0.60–1.60)

## 2018-05-06 ENCOUNTER — Other Ambulatory Visit: Payer: Self-pay | Admitting: Family Medicine

## 2018-05-08 ENCOUNTER — Encounter: Payer: Self-pay | Admitting: Family Medicine

## 2018-05-08 ENCOUNTER — Ambulatory Visit (HOSPITAL_BASED_OUTPATIENT_CLINIC_OR_DEPARTMENT_OTHER)
Admission: RE | Admit: 2018-05-08 | Discharge: 2018-05-08 | Disposition: A | Discharge status: Home / Self Care | Payer: Medicare HMO | Source: Ambulatory Visit | Attending: Family Medicine | Admitting: Family Medicine

## 2018-05-08 ENCOUNTER — Ambulatory Visit (INDEPENDENT_AMBULATORY_CARE_PROVIDER_SITE_OTHER): Payer: Medicare HMO | Admitting: Family Medicine

## 2018-05-08 VITALS — BP 102/68 | HR 75 | Temp 97.9°F | Resp 18 | Wt 188.0 lb

## 2018-05-08 DIAGNOSIS — R062 Wheezing: Secondary | ICD-10-CM | POA: Diagnosis not present

## 2018-05-08 DIAGNOSIS — J40 Bronchitis, not specified as acute or chronic: Secondary | ICD-10-CM

## 2018-05-08 DIAGNOSIS — I10 Essential (primary) hypertension: Secondary | ICD-10-CM

## 2018-05-08 DIAGNOSIS — J309 Allergic rhinitis, unspecified: Secondary | ICD-10-CM | POA: Diagnosis not present

## 2018-05-08 DIAGNOSIS — R05 Cough: Secondary | ICD-10-CM | POA: Diagnosis not present

## 2018-05-08 DIAGNOSIS — E11649 Type 2 diabetes mellitus with hypoglycemia without coma: Secondary | ICD-10-CM | POA: Diagnosis not present

## 2018-05-08 DIAGNOSIS — E782 Mixed hyperlipidemia: Secondary | ICD-10-CM | POA: Diagnosis not present

## 2018-05-08 DIAGNOSIS — R7989 Other specified abnormal findings of blood chemistry: Secondary | ICD-10-CM | POA: Diagnosis not present

## 2018-05-08 DIAGNOSIS — E559 Vitamin D deficiency, unspecified: Secondary | ICD-10-CM

## 2018-05-08 DIAGNOSIS — R059 Cough, unspecified: Secondary | ICD-10-CM

## 2018-05-08 DIAGNOSIS — L659 Nonscarring hair loss, unspecified: Secondary | ICD-10-CM

## 2018-05-08 MED ORDER — ALBUTEROL SULFATE HFA 108 (90 BASE) MCG/ACT IN AERS
2.0000 | INHALATION_SPRAY | Freq: Four times a day (QID) | RESPIRATORY_TRACT | 0 refills | Status: DC | PRN
Start: 2018-05-08 — End: 2018-08-09

## 2018-05-08 MED ORDER — CETIRIZINE HCL 10 MG PO TABS: 10.0000 mg | ORAL_TABLET | Freq: Every day | ORAL | 4 refills | Status: DC | PRN

## 2018-05-08 MED ORDER — DOXYCYCLINE HYCLATE 100 MG PO TABS
100.0000 mg | ORAL_TABLET | Freq: Two times a day (BID) | ORAL | 0 refills | Status: DC
Start: 2018-05-08 — End: 2018-08-09

## 2018-05-08 MED ORDER — METFORMIN HCL 500 MG PO TABS: ORAL_TABLET | ORAL | 1 refills | Status: DC

## 2018-05-08 MED ORDER — METHYLPREDNISOLONE 4 MG PO TABS: ORAL_TABLET | ORAL | 0 refills | Status: DC

## 2018-05-08 MED ORDER — FLUTICASONE PROPIONATE 50 MCG/ACT NA SUSP: 2.0000 | Freq: Every day | NASAL | 6 refills | Status: DC

## 2018-05-08 NOTE — Assessment & Plan Note (Signed)
hgba1c acceptable, minimize simple carbs. Increase exercise as tolerated.  

## 2018-05-08 NOTE — Assessment & Plan Note (Signed)
Encouraged heart healthy diet, increase exercise, avoid trans fats, consider a krill oil cap daily 

## 2018-05-08 NOTE — Assessment & Plan Note (Signed)
Well controlled, no changes to meds. Encouraged heart healthy diet such as the DASH diet and exercise as tolerated.  °

## 2018-05-08 NOTE — Assessment & Plan Note (Signed)
Symptoms began about 5 weeks ago after she mowed for the first time. She has not been taking any antihistamines or nasal meds. Early on she had fevers, chills but none now. Start Mucinex twice daily, Zyrtec and Flonase daily and nasal saline prn if no response then is given a prescription for an antibiotic

## 2018-05-08 NOTE — Assessment & Plan Note (Signed)
Added Collagen and Vitamin C and is improving

## 2018-05-08 NOTE — Assessment & Plan Note (Signed)
Start Medrol, mucinex, Doxycycyline, CXR today. If no improvement come in

## 2018-05-08 NOTE — Assessment & Plan Note (Signed)
Daily supplements 

## 2018-05-08 NOTE — Progress Notes (Signed)
Subjective:  I acted as a Education administrator for Dr. Charlett Blake. [place], [place]  Patient ID: Stacey Cooper, female    DOB: May 14, 1935, 83 y.o.   MRN: [id number]  No chief complaint on file.   HPI  Patient is in today for 2 month follow up and she has numerous concerns. She has been seen by Dr Novella Olive of orthopaedics for right shoulder pain and was given a prescription for Meloxicam which she only took a couple of times due to risk of adverse reaction. She notes since starting some exercises pain is tolerable. She also notes some mild increase in constipation but no increase in abdominal pain or bloody or tarry stool. Notes a cough that is keeping her up at night and malaise. Feels it started 5 weeks ago when she first mowed her lawn and it persists. Was having Fevers and Chills but those are better. Just malaise and myalgias. Denies CP/palp/SOB/HA/fevers/GI or GU c/o. Taking meds as prescribed  Patient Care Team: Mosie Lukes, MD as PCP - General (Family Medicine) Monna Fam, MD as Consulting Physician (Ophthalmology) Melrose Nakayama, MD as Consulting Physician (Orthopedic Surgery)   Past Medical History:  Diagnosis Date  . ABDOMINAL PAIN, RIGHT UPPER QUADRANT, HX OF 01/24/2011  . Actinic keratoses 07/18/2011  . Arthralgia 05/03/2015  . Back pain 12/08/2011  . Basal cell carcinoma of skin of lip 01/24/2011  . Blood transfusion without reported diagnosis   . Bronchitis 12/20/2012  . Cataract    bilataeral cateracts removed  . CHOLELITHIASIS, HX OF 02/07/2011  . Chronic kidney disease   . Diabetes mellitus   . Diabetes mellitus type 2 in obese [facility]) 01/24/2011   Qualifier: Diagnosis of  By: [name], [name]  Annual [facility] exam with [facility] on 06/14/13   . DM 01/24/2011  . Essential hypertension, benign 01/24/2011  . Fatty liver   . GERD 01/24/2011  . Grief reaction 01/20/2014  . HAIR LOSS 01/24/2011  . Hearing loss of both ears 06/28/2013  . Hepatic cirrhosis ([place]) 04/27/2015  . Hernia,  abdominal   . Hyperlipidemia 04/24/2012  . Hyperlipidemia, mixed 04/24/2012  . Insomnia 07/24/2012  . Joint pain   . Leg cramps 06/06/2013  . MEASLES, HX OF 01/24/2011  . Morbid obesity ([place]) 01/24/2011  . NONSPEC ELEVATION OF LEVELS OF TRANSAMINASE/LDH 02/07/2011  . Peripheral neuropathy 04/20/2011  . Preventative health care 08/23/2016  . Right hip pain 05/03/2015  . SCC (squamous cell carcinoma), face 07/18/2011  . SQUAMOUS CELL CARCINOMA OTHER SPEC SITES SKIN 01/24/2011  . Swelling   . Tubular adenoma of colon 04/2015  . Umbilical hernia 6/76/1950  . URINARY INCONTINENCE 02/07/2011  . Urinary tract infection, site not specified 04/30/2014  . UTI'S, HX OF 01/24/2011  . Vulvar atrophy 03/17/2017    Past Surgical History:  Procedure Laterality Date  . ABDOMINAL HYSTERECTOMY     partial, both ovaries left in place  . BCC removed below lower lip on left  26 yrs ago  . cataract extraction, b/l    . COLONOSCOPY    . Left knee arthroscopy and cleaning prior to TKR    . skin bx, right anterior CW , squamous cancer, had to redo to margins  12/2010  . total knee replacement, b/l      Family History  Problem Relation Age of Onset  . Arthritis Mother        RA  . Myelodysplastic syndrome Mother   . Heart disease Mother   . Cancer Father  skin  . Stroke Father   . Colon polyps Father        79 in [place] intes removed carcinoid tumor confined in the tumor  . Stroke Maternal Grandmother   . Kidney failure Maternal Grandfather   . Esophageal cancer Neg Hx   . Rectal cancer Neg Hx   . Stomach cancer Neg Hx     Social History   Socioeconomic History  . Marital status: Widowed    Spouse name: Not on file  . Number of children: Not on file  . Years of education: Not on file  . Highest education level: Not on file  Occupational History  . Occupation: retired  Scientific laboratory technician  . Financial resource strain: Not on file  . Food insecurity:    Worry: Not on file    Inability: Not on file    . Transportation needs:    Medical: Not on file    Non-medical: Not on file  Tobacco Use  . Smoking status: Never Smoker  . Smokeless tobacco: Never Used  Substance and Sexual Activity  . Alcohol use: No    Alcohol/week: 0.0 oz  . Drug use: No  . Sexual activity: Yes  Lifestyle  . Physical activity:    Days per week: Not on file    Minutes per session: Not on file  . Stress: Not on file  Relationships  . Social connections:    Talks on phone: Not on file    Gets together: Not on file    Attends religious service: Not on file    Active member of club or organization: Not on file    Attends meetings of clubs or organizations: Not on file    Relationship status: Not on file  . Intimate partner violence:    Fear of current or ex partner: Not on file    Emotionally abused: Not on file    Physically abused: Not on file    Forced sexual activity: Not on file  Other Topics Concern  . Not on file  Social History Narrative  . Not on file    Outpatient Medications Prior to Visit  Medication Sig Dispense Refill  . Blood Glucose Monitoring Suppl (PRODIGY AUTOCODE BLOOD GLUCOSE) w/Device KIT 1 Device by Does not apply route 3 (three) times daily between meals as needed. 1 each 0  . furosemide (LASIX) 20 MG tablet Take 1 tablet (20 mg total) by mouth daily. 90 tablet 0  . glucose blood (FREESTYLE LITE) test strip USE TO TEST BLOOD SUGAR TWICE DAILY AS DIRECTED 100 each 6  . Insulin Pen Needle (NOVOFINE) 32G X 6 MM MISC Use with Victoza 100 each 6  . Lancets MISC by Does not apply route. Check blood sugars qam and once daily prn     . lisinopril (PRINIVIL,ZESTRIL) 5 MG tablet TAKE 1 TABLET(5 MG) BY MOUTH DAILY 90 tablet 1  . rosuvastatin (CRESTOR) 5 MG tablet TAKE ONE TABLET EVERY OTHER DAY. 30 tablet 3  . Vitamin D, Ergocalciferol, (DRISDOL) 50000 units CAPS capsule Take 1 capsule (50,000 Units total) by mouth as directed. Take one tab every 14 days 4 capsule 0  . metFORMIN  (GLUCOPHAGE) 500 MG tablet TAKE 1 TABLET BY MOUTH IN THE MORNING AND TAKE 2 TABLETS BY MOUTH IN THE EVENING 270 tablet 0   No facility-administered medications prior to visit.     Allergies  Allergen Reactions  . Contrast Media [Iodinated Diagnostic Agents] Shortness Of Breath  . Lovastatin  myalgias    Review of Systems  Constitutional: Positive for malaise/fatigue. Negative for fever.  HENT: Positive for congestion.   Eyes: Negative for blurred vision.  Respiratory: Positive for cough, sputum production and shortness of breath.   Cardiovascular: Negative for chest pain, palpitations and leg swelling.  Gastrointestinal: Negative for abdominal pain, blood in stool and nausea.  Genitourinary: Negative for dysuria and frequency.  Musculoskeletal: Negative for falls.  Skin: Negative for rash.  Neurological: Negative for dizziness, loss of consciousness and headaches.  Endo/Heme/Allergies: Negative for environmental allergies.  Psychiatric/Behavioral: Negative for depression. The patient has insomnia. The patient is not nervous/anxious.        Objective:    Physical Exam  Constitutional: She is oriented to person, place, and time. She appears well-developed and well-nourished. No distress.  HENT:  Head: Normocephalic and atraumatic.  Nose: Nose normal.  Eyes: Right eye exhibits no discharge. Left eye exhibits no discharge.  Neck: Normal range of motion. Neck supple.  Cardiovascular: Normal rate and regular rhythm.  No murmur heard. Pulmonary/Chest: Effort normal. She has wheezes.  Abdominal: Soft. Bowel sounds are normal. There is no tenderness.  Musculoskeletal: She exhibits no edema.  Neurological: She is alert and oriented to person, place, and time.  Skin: Skin is warm and dry.  Psychiatric: She has a normal mood and affect.  Nursing note and vitals reviewed.   BP 102/68 (BP Location: Left Arm, Patient Position: Sitting, Cuff Size: Normal)   Pulse 75   Temp 97.9  F (36.6 C) (Oral)   Resp 18   Wt 188 lb (85.3 kg)   SpO2 98%   BMI 34.39 kg/m  Wt Readings from Last 3 Encounters:  05/08/18 188 lb (85.3 kg)  03/12/18 186 lb 6.4 oz (84.6 kg)  01/30/18 179 lb (81.2 kg)   BP Readings from Last 3 Encounters:  05/08/18 102/68  03/12/18 112/70  01/30/18 116/71     Immunization History  Administered Date(s) Administered  . Influenza Split 10/20/2011, 11/29/2012  . Influenza Whole 08/26/2010  . Influenza, High Dose Seasonal PF 08/23/2016  . Influenza,inj,Quad PF,6+ Mos 09/05/2013, 08/28/2014, 11/12/2015  . Pneumococcal Conjugate-13 08/28/2014  . Pneumococcal Polysaccharide-23 11/12/2015    Health Maintenance  Topic Date Due  . TETANUS/TDAP  09/07/1956  . FOOT EXAM  08/08/2017  . INFLUENZA VACCINE  07/26/2018  . HEMOGLOBIN A1C  11/01/2018  . OPHTHALMOLOGY EXAM  02/15/2019  . DEXA SCAN  Completed  . PNA vac Low Risk Adult  Completed    Lab Results  Component Value Date   WBC 12.5 (H) 05/01/2018   HGB 13.2 05/01/2018   HCT 39.2 05/01/2018   PLT 248.0 05/01/2018   GLUCOSE 125 (H) 05/01/2018   CHOL 142 05/01/2018   TRIG 121.0 05/01/2018   HDL 42.50 05/01/2018   LDLCALC 75 05/01/2018   ALT 11 05/01/2018   AST 22 05/01/2018   NA 138 05/01/2018   K 4.1 05/01/2018   CL 102 05/01/2018   CREATININE 0.87 05/01/2018   BUN 22 05/01/2018   CO2 28 05/01/2018   TSH 5.19 (H) 05/01/2018   INR 1.0 11/21/2016   HGBA1C 6.5 05/01/2018   MICROALBUR <0.7 11/12/2015    Lab Results  Component Value Date   TSH 5.19 (H) 05/01/2018   Lab Results  Component Value Date   WBC 12.5 (H) 05/01/2018   HGB 13.2 05/01/2018   HCT 39.2 05/01/2018   MCV 94.6 05/01/2018   PLT 248.0 05/01/2018   Lab Results  Component Value  Date   NA 138 05/01/2018   K 4.1 05/01/2018   CO2 28 05/01/2018   GLUCOSE 125 (H) 05/01/2018   BUN 22 05/01/2018   CREATININE 0.87 05/01/2018   BILITOT 0.7 05/01/2018   ALKPHOS 100 05/01/2018   AST 22 05/01/2018   ALT 11  05/01/2018   PROT 7.1 05/01/2018   ALBUMIN 3.6 05/01/2018   CALCIUM 9.5 05/01/2018   GFR 66.48 05/01/2018   Lab Results  Component Value Date   CHOL 142 05/01/2018   Lab Results  Component Value Date   HDL 42.50 05/01/2018   Lab Results  Component Value Date   LDLCALC 75 05/01/2018   Lab Results  Component Value Date   TRIG 121.0 05/01/2018   Lab Results  Component Value Date   CHOLHDL 3 05/01/2018   Lab Results  Component Value Date   HGBA1C 6.5 05/01/2018         Assessment & Plan:   Problem List Items Addressed This Visit    ESSENTIAL HYPERTENSION, BENIGN    Well controlled, no changes to meds. Encouraged heart healthy diet such as the DASH diet and exercise as tolerated.       Alopecia    Added Collagen and Vitamin C and is improving      Hyperlipidemia, mixed    Encouraged heart healthy diet, increase exercise, avoid trans fats, consider a krill oil cap daily      Bronchitis    Start Medrol, mucinex, Doxycycyline, CXR today. If no improvement come in      Vitamin D deficiency    Daily supplements      Type 2 diabetes mellitus with hypoglycemia without coma, without long-term current use of insulin (HCC)    hgba1c acceptable, minimize simple carbs. Increase exercise as tolerated.      Relevant Medications   metFORMIN (GLUCOPHAGE) 500 MG tablet   Abnormal TSH    Repeat labs      Allergic rhinitis    Symptoms began about 5 weeks ago after she mowed for the first time. She has not been taking any antihistamines or nasal meds. Early on she had fevers, chills but none now. Start Mucinex twice daily, Zyrtec and Flonase daily and nasal saline prn if no response then is given a prescription for an antibiotic       Other Visit Diagnoses    Cough    -  Primary   Relevant Orders   DG Chest 2 View      I have changed Dailey T. Harl's metFORMIN. I am also having her start on doxycycline, albuterol, methylPREDNISolone, cetirizine, and  fluticasone. Additionally, I am having her maintain her Lancets, furosemide, lisinopril, Vitamin D (Ergocalciferol), glucose blood, Insulin Pen Needle, Prodigy Autocode Blood Glucose, and rosuvastatin.  Meds ordered this encounter  Medications  . doxycycline (VIBRA-TABS) 100 MG tablet    Sig: Take 1 tablet (100 mg total) by mouth 2 (two) times daily.    Dispense:  20 tablet    Refill:  0  . albuterol (PROVENTIL HFA;VENTOLIN HFA) 108 (90 Base) MCG/ACT inhaler    Sig: Inhale 2 puffs into the lungs every 6 (six) hours as needed for wheezing or shortness of breath.    Dispense:  1 Inhaler    Refill:  0  . methylPREDNISolone (MEDROL) 4 MG tablet    Sig: 5 tab po qd X 1d then 4 tab po qd X 1d then 3 tab po qd X 1d then 2 tab po qd then 1  tab po qd    Dispense:  15 tablet    Refill:  0  . cetirizine (ZYRTEC) 10 MG tablet    Sig: Take 1 tablet (10 mg total) by mouth daily as needed for allergies.    Dispense:  30 tablet    Refill:  4  . fluticasone (FLONASE) 50 MCG/ACT nasal spray    Sig: Place 2 sprays into both nostrils daily.    Dispense:  16 g    Refill:  6  . metFORMIN (GLUCOPHAGE) 500 MG tablet    Sig: 2 tabs po bid and 1 tab po q noon    Dispense:  450 tablet    Refill:  1    CMA served as scribe during this visit. History, Physical and Plan performed by medical provider. Documentation and orders reviewed and attested to.  Penni Homans, MD

## 2018-05-08 NOTE — Assessment & Plan Note (Signed)
Repeat labs:

## 2018-05-08 NOTE — Patient Instructions (Addendum)
Consider Melatonin 1-10 mg if no results If no help can consider UNISOM or Benadryl/Diphenhydramine 25 mg tab at bed   Encouraged increased hydration and fiber in diet. Daily probiotics. If bowels not moving can use MOM 2 tbls po in 4 oz of warm prune juice by mouth every 2-3 days. If no results then repeat in 4 hours with  Dulcolax suppository pr, may repeat again in 4 more hours as needed. Seek care if symptoms worsen. Consider daily Miralax and/or Dulcolax if symptoms persist.   Can also use Miralax mixed with Benefiber once to twice  Take plain Mucinex twice daily x 10 days.  Start Zyrtec 10 mg daily and Flonase 2 sprays each nostril daily If no improvement then you can start the antibiotic given   Use nasal saline to flush nose after working outside   Insomnia Insomnia is a sleep disorder that makes it difficult to fall asleep or to stay asleep. Insomnia can cause tiredness (fatigue), low energy, difficulty concentrating, mood swings, and poor performance at work or school. There are three different ways to classify insomnia:  Difficulty falling asleep.  Difficulty staying asleep.  Waking up too early in the morning.  Any type of insomnia can be long-term (chronic) or short-term (acute). Both are common. Short-term insomnia usually lasts for three months or less. Chronic insomnia occurs at least three times a week for longer than three months. What are the causes? Insomnia may be caused by another condition, situation, or substance, such as:  Anxiety.  Certain medicines.  Gastroesophageal reflux disease (GERD) or other gastrointestinal conditions.  Asthma or other breathing conditions.  Restless legs syndrome, sleep apnea, or other sleep disorders.  Chronic pain.  Menopause. This may include hot flashes.  Stroke.  Abuse of alcohol, tobacco, or illegal drugs.  Depression.  Caffeine.  Neurological disorders, such as Alzheimer disease.  An overactive thyroid  (hyperthyroidism).  The cause of insomnia may not be known. What increases the risk? Risk factors for insomnia include:  Gender. Women are more commonly affected than men.  Age. Insomnia is more common as you get older.  Stress. This may involve your professional or personal life.  Income. Insomnia is more common in people with lower income.  Lack of exercise.  Irregular work schedule or night shifts.  Traveling between different time zones.  What are the signs or symptoms? If you have insomnia, trouble falling asleep or trouble staying asleep is the main symptom. This may lead to other symptoms, such as:  Feeling fatigued.  Feeling nervous about going to sleep.  Not feeling rested in the morning.  Having trouble concentrating.  Feeling irritable, anxious, or depressed.  How is this treated? Treatment for insomnia depends on the cause. If your insomnia is caused by an underlying condition, treatment will focus on addressing the condition. Treatment may also include:  Medicines to help you sleep.  Counseling or therapy.  Lifestyle adjustments.  Follow these instructions at home:  Take medicines only as directed by your health care provider.  Keep regular sleeping and waking hours. Avoid naps.  Keep a sleep diary to help you and your health care provider figure out what could be causing your insomnia. Include: ? When you sleep. ? When you wake up during the night. ? How well you sleep. ? How rested you feel the next day. ? Any side effects of medicines you are taking. ? What you eat and drink.  Make your bedroom a comfortable place where it is  easy to fall asleep: ? Put up shades or special blackout curtains to block light from outside. ? Use a white noise machine to block noise. ? Keep the temperature cool.  Exercise regularly as directed by your health care provider. Avoid exercising right before bedtime.  Use relaxation techniques to manage stress. Ask  your health care provider to suggest some techniques that may work well for you. These may include: ? Breathing exercises. ? Routines to release muscle tension. ? Visualizing peaceful scenes.  Cut back on alcohol, caffeinated beverages, and cigarettes, especially close to bedtime. These can disrupt your sleep.  Do not overeat or eat spicy foods right before bedtime. This can lead to digestive discomfort that can make it hard for you to sleep.  Limit screen use before bedtime. This includes: ? Watching TV. ? Using your smartphone, tablet, and computer.  Stick to a routine. This can help you fall asleep faster. Try to do a quiet activity, brush your teeth, and go to bed at the same time each night.  Get out of bed if you are still awake after 15 minutes of trying to sleep. Keep the lights down, but try reading or doing a quiet activity. When you feel sleepy, go back to bed.  Make sure that you drive carefully. Avoid driving if you feel very sleepy.  Keep all follow-up appointments as directed by your health care provider. This is important. Contact a health care provider if:  You are tired throughout the day or have trouble in your daily routine due to sleepiness.  You continue to have sleep problems or your sleep problems get worse. Get help right away if:  You have serious thoughts about hurting yourself or someone else. This information is not intended to replace advice given to you by your health care provider. Make sure you discuss any questions you have with your health care provider. Document Released: 12/09/2000 Document Revised: 05/13/2016 Document Reviewed: 09/12/2014 Elsevier Interactive Patient Education  [employer].

## 2018-05-14 DIAGNOSIS — E785 Hyperlipidemia, unspecified: Secondary | ICD-10-CM | POA: Diagnosis not present

## 2018-05-14 DIAGNOSIS — E669 Obesity, unspecified: Secondary | ICD-10-CM | POA: Diagnosis not present

## 2018-05-14 DIAGNOSIS — G8929 Other chronic pain: Secondary | ICD-10-CM | POA: Diagnosis not present

## 2018-05-14 DIAGNOSIS — R32 Unspecified urinary incontinence: Secondary | ICD-10-CM | POA: Diagnosis not present

## 2018-05-14 DIAGNOSIS — M810 Age-related osteoporosis without current pathological fracture: Secondary | ICD-10-CM | POA: Diagnosis not present

## 2018-05-14 DIAGNOSIS — K08409 Partial loss of teeth, unspecified cause, unspecified class: Secondary | ICD-10-CM | POA: Diagnosis not present

## 2018-05-14 DIAGNOSIS — H04129 Dry eye syndrome of unspecified lacrimal gland: Secondary | ICD-10-CM | POA: Diagnosis not present

## 2018-05-14 DIAGNOSIS — E119 Type 2 diabetes mellitus without complications: Secondary | ICD-10-CM | POA: Diagnosis not present

## 2018-05-14 DIAGNOSIS — J302 Other seasonal allergic rhinitis: Secondary | ICD-10-CM | POA: Diagnosis not present

## 2018-05-14 DIAGNOSIS — M1990 Unspecified osteoarthritis, unspecified site: Secondary | ICD-10-CM | POA: Diagnosis not present

## 2018-05-20 DIAGNOSIS — R69 Illness, unspecified: Secondary | ICD-10-CM | POA: Diagnosis not present

## 2018-06-05 ENCOUNTER — Ambulatory Visit (INDEPENDENT_AMBULATORY_CARE_PROVIDER_SITE_OTHER): Payer: Medicare HMO | Admitting: Family Medicine

## 2018-06-05 ENCOUNTER — Encounter: Payer: Self-pay | Admitting: Family Medicine

## 2018-06-05 VITALS — BP 106/60 | HR 67 | Temp 98.5°F | Resp 18 | Wt 189.2 lb

## 2018-06-05 DIAGNOSIS — K219 Gastro-esophageal reflux disease without esophagitis: Secondary | ICD-10-CM | POA: Diagnosis not present

## 2018-06-05 DIAGNOSIS — I10 Essential (primary) hypertension: Secondary | ICD-10-CM

## 2018-06-05 DIAGNOSIS — E119 Type 2 diabetes mellitus without complications: Secondary | ICD-10-CM | POA: Diagnosis not present

## 2018-06-05 DIAGNOSIS — J0100 Acute maxillary sinusitis, unspecified: Secondary | ICD-10-CM | POA: Diagnosis not present

## 2018-06-05 DIAGNOSIS — R05 Cough: Secondary | ICD-10-CM

## 2018-06-05 DIAGNOSIS — G4700 Insomnia, unspecified: Secondary | ICD-10-CM | POA: Diagnosis not present

## 2018-06-05 DIAGNOSIS — R059 Cough, unspecified: Secondary | ICD-10-CM

## 2018-06-05 MED ORDER — PREDNISONE 20 MG PO TABS: 20.0000 mg | ORAL_TABLET | Freq: Two times a day (BID) | ORAL | 0 refills | Status: DC

## 2018-06-05 MED ORDER — HYDROCODONE-HOMATROPINE 5-1.5 MG/5ML PO SYRP: 2.5000 mL | ORAL_SOLUTION | Freq: Every evening | ORAL | 0 refills | Status: DC | PRN

## 2018-06-05 MED ORDER — CEFTRIAXONE SODIUM 500 MG IJ SOLR
500.0000 mg | Freq: Once | INTRAMUSCULAR | Status: AC
  Administered 2018-06-05: 500 mg via INTRAMUSCULAR

## 2018-06-05 MED ORDER — CEFDINIR 300 MG PO CAPS: 300.0000 mg | ORAL_CAPSULE | Freq: Two times a day (BID) | ORAL | 0 refills | Status: AC

## 2018-06-05 MED ORDER — METHYLPREDNISOLONE ACETATE 40 MG/ML IJ SUSP
40.0000 mg | Freq: Once | INTRAMUSCULAR | Status: AC
  Administered 2018-06-05: 40 mg via INTRAMUSCULAR

## 2018-06-05 NOTE — Patient Instructions (Addendum)
Probiotic daily for 1 month such as [facility] or [place] or [place] mucinex twice daily Sinusitis, Adult Sinusitis is soreness and inflammation of your sinuses. Sinuses are hollow spaces in the bones around your face. Your sinuses are located:  Around your eyes.  In the middle of your forehead.  Behind your nose.  In your cheekbones.  Your sinuses and nasal passages are lined with a stringy fluid (mucus). Mucus normally drains out of your sinuses. When your nasal tissues become inflamed or swollen, the mucus can become trapped or blocked so air cannot flow through your sinuses. This allows bacteria, viruses, and funguses to grow, which leads to infection. Sinusitis can develop quickly and last for 7?10 days (acute) or for more than 12 weeks (chronic). Sinusitis often develops after a cold. What are the causes? This condition is caused by anything that creates swelling in the sinuses or stops mucus from draining, including:  Allergies.  Asthma.  Bacterial or viral infection.  Abnormally shaped bones between the nasal passages.  Nasal growths that contain mucus (nasal polyps).  Narrow sinus openings.  Pollutants, such as chemicals or irritants in the air.  A foreign object stuck in the nose.  A fungal infection. This is rare.  What increases the risk? The following factors may make you more likely to develop this condition:  Having allergies or asthma.  Having had a recent cold or respiratory tract infection.  Having structural deformities or blockages in your nose or sinuses.  Having a weak immune system.  Doing a lot of swimming or diving.  Overusing nasal sprays.  Smoking.  What are the signs or symptoms? The main symptoms of this condition are pain and a feeling of pressure around the affected sinuses. Other symptoms include:  Upper toothache.  Earache.  Headache.  Bad breath.  Decreased sense of smell and taste.  A  cough that may get worse at night.  Fatigue.  Fever.  Thick drainage from your nose. The drainage is often green and it may contain pus (purulent).  Stuffy nose or congestion.  Postnasal drip. This is when extra mucus collects in the throat or back of the nose.  Swelling and warmth over the affected sinuses.  Sore throat.  Sensitivity to light.  How is this diagnosed? This condition is diagnosed based on symptoms, a medical history, and a physical exam. To find out if your condition is acute or chronic, your health care provider may:  Look in your nose for signs of nasal polyps.  Tap over the affected sinus to check for signs of infection.  View the inside of your sinuses using an imaging device that has a light attached (endoscope).  If your health care provider suspects that you have chronic sinusitis, you may also:  Be tested for allergies.  Have a sample of mucus taken from your nose (nasal culture) and checked for bacteria.  Have a mucus sample examined to see if your sinusitis is related to an allergy.  If your sinusitis does not respond to treatment and it lasts longer than 8 weeks, you may have an MRI or CT scan to check your sinuses. These scans also help to determine how severe your infection is. In rare cases, a bone biopsy may be done to rule out more serious types of fungal sinus disease. How is this treated? Treatment for sinusitis depends on the cause and whether your condition is chronic or acute. If a virus is causing your sinusitis, your  symptoms will go away on their own within 10 days. You may be given medicines to relieve your symptoms, including:  Topical nasal decongestants. They shrink swollen nasal passages and let mucus drain from your sinuses.  Antihistamines. These drugs block inflammation that is triggered by allergies. This can help to ease swelling in your nose and sinuses.  Topical nasal corticosteroids. These are nasal sprays that ease  inflammation and swelling in your nose and sinuses.  Nasal saline washes. These rinses can help to get rid of thick mucus in your nose.  If your condition is caused by bacteria, you will be given an antibiotic medicine. If your condition is caused by a fungus, you will be given an antifungal medicine. Surgery may be needed to correct underlying conditions, such as narrow nasal passages. Surgery may also be needed to remove polyps. Follow these instructions at home: Medicines  Take, use, or apply over-the-counter and prescription medicines only as told by your health care provider. These may include nasal sprays.  If you were prescribed an antibiotic medicine, take it as told by your health care provider. Do not stop taking the antibiotic even if you start to feel better. Hydrate and Humidify  Drink enough water to keep your urine clear or pale yellow. Staying hydrated will help to thin your mucus.  Use a cool mist humidifier to keep the humidity level in your home above 50%.  Inhale steam for 10-15 minutes, 3-4 times a day or as told by your health care provider. You can do this in the bathroom while a hot shower is running.  Limit your exposure to cool or dry air. Rest  Rest as much as possible.  Sleep with your head raised (elevated).  Make sure to get enough sleep each night. General instructions  Apply a warm, moist washcloth to your face 3-4 times a day or as told by your health care provider. This will help with discomfort.  Wash your hands often with soap and water to reduce your exposure to viruses and other germs. If soap and water are not available, use hand sanitizer.  Do not smoke. Avoid being around people who are smoking (secondhand smoke).  Keep all follow-up visits as told by your health care provider. This is important. Contact a health care provider if:  You have a fever.  Your symptoms get worse.  Your symptoms do not improve within 10 days. Get help  right away if:  You have a severe headache.  You have persistent vomiting.  You have pain or swelling around your face or eyes.  You have vision problems.  You develop confusion.  Your neck is stiff.  You have trouble breathing. This information is not intended to replace advice given to you by your health care provider. Make sure you discuss any questions you have with your health care provider. Document Released: 12/12/2005 Document Revised: 08/07/2016 Document Reviewed: 10/07/2015 Elsevier Interactive Patient Education  [employer].

## 2018-06-06 DIAGNOSIS — J0100 Acute maxillary sinusitis, unspecified: Secondary | ICD-10-CM | POA: Insufficient documentation

## 2018-06-06 NOTE — Assessment & Plan Note (Signed)
Encouraged good sleep hygiene such as dark, quiet room. No blue/green glowing lights such as computer screens in bedroom. No alcohol or stimulants in evening. Cut down on caffeine as able. Regular exercise is helpful but not just prior to bed time. Worse with cough, use Hydromet prn

## 2018-06-06 NOTE — Assessment & Plan Note (Signed)
Well controlled, no changes to meds. Encouraged heart healthy diet such as the DASH diet and exercise as tolerated.  °

## 2018-06-06 NOTE — Assessment & Plan Note (Signed)
hgba1c acceptable, minimize simple carbs. Increase exercise as tolerated. Continue current meds 

## 2018-06-06 NOTE — Assessment & Plan Note (Signed)
Avoid offending foods, start probiotics. Do not eat large meals in late evening and consider raising head of bed.  

## 2018-06-06 NOTE — Assessment & Plan Note (Signed)
Started on IM antibiotics and steroids and given oral to start tomorrow. Use albuterol prn, Hydromet prn

## 2018-06-06 NOTE — Progress Notes (Signed)
Subjective:    Patient ID: Stacey Cooper, female    DOB: 1935/07/16, 83 y.o.   MRN: [id number]  No chief complaint on file.   HPI Patient is in today for evaluation of a cough that is keeping her up at night. The cough is generally dry. She endorses a headache and facial pain, nas nasal congestion. Does not feel the Doxycycline was helpful. She has been unable to sleep due to the cough. Denies CP/palp/SOB/fevers/GI or GU c/o. Taking meds as prescribed  Past Medical History:  Diagnosis Date  . ABDOMINAL PAIN, RIGHT UPPER QUADRANT, HX OF 01/24/2011  . Actinic keratoses 07/18/2011  . Arthralgia 05/03/2015  . Back pain 12/08/2011  . Basal cell carcinoma of skin of lip 01/24/2011  . Blood transfusion without reported diagnosis   . Bronchitis 12/20/2012  . Cataract    bilataeral cateracts removed  . CHOLELITHIASIS, HX OF 02/07/2011  . Chronic kidney disease   . Diabetes mellitus   . Diabetes mellitus type 2 in obese [facility]) 01/24/2011   Qualifier: Diagnosis of  By: [name], [name]  [facility]ual [facility] exam with [facility] on 06/14/13   . DM 01/24/2011  . Essential hypertension, benign 01/24/2011  . Fatty liver   . GERD 01/24/2011  . Grief reaction 01/20/2014  . HAIR LOSS 01/24/2011  . Hearing loss of both ears 06/28/2013  . Hepatic cirrhosis ([place]) 04/27/2015  . Hernia, abdominal   . Hyperlipidemia 04/24/2012  . Hyperlipidemia, mixed 04/24/2012  . Insomnia 07/24/2012  . Joint pain   . Leg cramps 06/06/2013  . MEASLES, HX OF 01/24/2011  . Morbid obesity ([place]) 01/24/2011  . NONSPEC ELEVATION OF LEVELS OF TRANSAMINASE/LDH 02/07/2011  . Peripheral neuropathy 04/20/2011  . Preventative health care 08/23/2016  . Right hip pain 05/03/2015  . SCC (squamous cell carcinoma), face 07/18/2011  . SQUAMOUS CELL CARCINOMA OTHER SPEC SITES SKIN 01/24/2011  . Swelling   . Tubular adenoma of colon 04/2015  . Umbilical hernia 04/17/5360  . URINARY INCONTINENCE 02/07/2011  . Urinary tract infection, site not specified  04/30/2014  . UTI'S, HX OF 01/24/2011  . Vulvar atrophy 03/17/2017    Past Surgical History:  Procedure Laterality Date  . ABDOMINAL HYSTERECTOMY     partial, both ovaries left in place  . BCC removed below lower lip on left  26 yrs ago  . cataract extraction, b/l    . COLONOSCOPY    . Left knee arthroscopy and cleaning prior to TKR    . skin bx, right anterior CW , squamous cancer, had to redo to margins  12/2010  . total knee replacement, b/l      Family History  Problem Relation Age of Onset  . Arthritis Mother        RA  . Myelodysplastic syndrome Mother   . Heart disease Mother   . Cancer Father        skin  . Stroke Father   . Colon polyps Father        70 in [place] intes removed carcinoid tumor confined in the tumor  . Stroke Maternal Grandmother   . Kidney failure Maternal Grandfather   . Esophageal cancer Neg Hx   . Rectal cancer Neg Hx   . Stomach cancer Neg Hx     Social History   Socioeconomic History  . Marital status: Widowed    Spouse name: Not on file  . Number of children: Not on file  . Years of education: Not on file  .  Highest education level: Not on file  Occupational History  . Occupation: retired  Scientific laboratory technician  . Financial resource strain: Not on file  . Food insecurity:    Worry: Not on file    Inability: Not on file  . Transportation needs:    Medical: Not on file    Non-medical: Not on file  Tobacco Use  . Smoking status: Never Smoker  . Smokeless tobacco: Never Used  Substance and Sexual Activity  . Alcohol use: No    Alcohol/week: 0.0 oz  . Drug use: No  . Sexual activity: Yes  Lifestyle  . Physical activity:    Days per week: Not on file    Minutes per session: Not on file  . Stress: Not on file  Relationships  . Social connections:    Talks on phone: Not on file    Gets together: Not on file    Attends religious service: Not on file    Active member of club or organization: Not on file    Attends meetings of clubs or  organizations: Not on file    Relationship status: Not on file  . Intimate partner violence:    Fear of current or ex partner: Not on file    Emotionally abused: Not on file    Physically abused: Not on file    Forced sexual activity: Not on file  Other Topics Concern  . Not on file  Social History Narrative  . Not on file    Outpatient Medications Prior to Visit  Medication Sig Dispense Refill  . albuterol (PROVENTIL HFA;VENTOLIN HFA) 108 (90 Base) MCG/ACT inhaler Inhale 2 puffs into the lungs every 6 (six) hours as needed for wheezing or shortness of breath. 1 Inhaler 0  . Blood Glucose Monitoring Suppl (PRODIGY AUTOCODE BLOOD GLUCOSE) w/Device KIT 1 Device by Does not apply route 3 (three) times daily between meals as needed. 1 each 0  . cetirizine (ZYRTEC) 10 MG tablet Take 1 tablet (10 mg total) by mouth daily as needed for allergies. 30 tablet 4  . doxycycline (VIBRA-TABS) 100 MG tablet Take 1 tablet (100 mg total) by mouth 2 (two) times daily. 20 tablet 0  . fluticasone (FLONASE) 50 MCG/ACT nasal spray Place 2 sprays into both nostrils daily. 16 g 6  . furosemide (LASIX) 20 MG tablet Take 1 tablet (20 mg total) by mouth daily. 90 tablet 0  . glucose blood (FREESTYLE LITE) test strip USE TO TEST BLOOD SUGAR TWICE DAILY AS DIRECTED 100 each 6  . Insulin Pen Needle (NOVOFINE) 32G X 6 MM MISC Use with Victoza 100 each 6  . Lancets MISC by Does not apply route. Check blood sugars qam and once daily prn     . lisinopril (PRINIVIL,ZESTRIL) 5 MG tablet TAKE 1 TABLET(5 MG) BY MOUTH DAILY 90 tablet 1  . metFORMIN (GLUCOPHAGE) 500 MG tablet 2 tabs po bid and 1 tab po q noon 450 tablet 1  . methylPREDNISolone (MEDROL) 4 MG tablet 5 tab po qd X 1d then 4 tab po qd X 1d then 3 tab po qd X 1d then 2 tab po qd then 1 tab po qd 15 tablet 0  . rosuvastatin (CRESTOR) 5 MG tablet TAKE ONE TABLET EVERY OTHER DAY. 30 tablet 3  . Vitamin D, Ergocalciferol, (DRISDOL) 50000 units CAPS capsule Take 1  capsule (50,000 Units total) by mouth as directed. Take one tab every 14 days 4 capsule 0   No facility-administered medications prior to  visit.     Allergies  Allergen Reactions  . Contrast Media [Iodinated Diagnostic Agents] Shortness Of Breath  . Doxycycline     Severe oral ulcers.  . Lovastatin     myalgias    Review of Systems  Constitutional: Positive for malaise/fatigue. Negative for fever.  HENT: Positive for congestion.   Eyes: Negative for blurred vision.  Respiratory: Positive for cough and sputum production. Negative for shortness of breath.   Cardiovascular: Negative for chest pain, palpitations and leg swelling.  Gastrointestinal: Negative for abdominal pain, blood in stool and nausea.  Genitourinary: Negative for dysuria and frequency.  Musculoskeletal: Negative for falls.  Skin: Negative for rash.  Neurological: Positive for headaches. Negative for dizziness and loss of consciousness.  Endo/Heme/Allergies: Negative for environmental allergies.  Psychiatric/Behavioral: Negative for depression. The patient is not nervous/anxious.        Objective:    Physical Exam  Constitutional: She is oriented to person, place, and time. She appears well-developed and well-nourished. No distress.  HENT:  Head: Normocephalic and atraumatic.  Nose: Nose normal.  Eyes: Right eye exhibits no discharge. Left eye exhibits no discharge.  Neck: Normal range of motion. Neck supple.  Cardiovascular: Normal rate and regular rhythm.  No murmur heard. Pulmonary/Chest: Effort normal and breath sounds normal.  Abdominal: Soft. Bowel sounds are normal. There is no tenderness.  Musculoskeletal: She exhibits no edema.  Neurological: She is alert and oriented to person, place, and time.  Skin: Skin is warm and dry.  Psychiatric: She has a normal mood and affect.  Nursing note and vitals reviewed.   BP 106/60   Pulse 67   Temp 98.5 F (36.9 C) (Oral)   Resp 18   Wt 189 lb 3.2 oz  (85.8 kg)   SpO2 97%   BMI 34.61 kg/m  Wt Readings from Last 3 Encounters:  06/05/18 189 lb 3.2 oz (85.8 kg)  05/08/18 188 lb (85.3 kg)  03/12/18 186 lb 6.4 oz (84.6 kg)     Lab Results  Component Value Date   WBC 12.5 (H) 05/01/2018   HGB 13.2 05/01/2018   HCT 39.2 05/01/2018   PLT 248.0 05/01/2018   GLUCOSE 125 (H) 05/01/2018   CHOL 142 05/01/2018   TRIG 121.0 05/01/2018   HDL 42.50 05/01/2018   LDLCALC 75 05/01/2018   ALT 11 05/01/2018   AST 22 05/01/2018   NA 138 05/01/2018   K 4.1 05/01/2018   CL 102 05/01/2018   CREATININE 0.87 05/01/2018   BUN 22 05/01/2018   CO2 28 05/01/2018   TSH 5.19 (H) 05/01/2018   INR 1.0 11/21/2016   HGBA1C 6.5 05/01/2018   MICROALBUR <0.7 11/12/2015    Lab Results  Component Value Date   TSH 5.19 (H) 05/01/2018   Lab Results  Component Value Date   WBC 12.5 (H) 05/01/2018   HGB 13.2 05/01/2018   HCT 39.2 05/01/2018   MCV 94.6 05/01/2018   PLT 248.0 05/01/2018   Lab Results  Component Value Date   NA 138 05/01/2018   K 4.1 05/01/2018   CO2 28 05/01/2018   GLUCOSE 125 (H) 05/01/2018   BUN 22 05/01/2018   CREATININE 0.87 05/01/2018   BILITOT 0.7 05/01/2018   ALKPHOS 100 05/01/2018   AST 22 05/01/2018   ALT 11 05/01/2018   PROT 7.1 05/01/2018   ALBUMIN 3.6 05/01/2018   CALCIUM 9.5 05/01/2018   GFR 66.48 05/01/2018   Lab Results  Component Value Date   CHOL 142 05/01/2018  Lab Results  Component Value Date   HDL 42.50 05/01/2018   Lab Results  Component Value Date   LDLCALC 75 05/01/2018   Lab Results  Component Value Date   TRIG 121.0 05/01/2018   Lab Results  Component Value Date   CHOLHDL 3 05/01/2018   Lab Results  Component Value Date   HGBA1C 6.5 05/01/2018       Assessment & Plan:   Problem List Items Addressed This Visit    ESSENTIAL HYPERTENSION, BENIGN    Well controlled, no changes to meds. Encouraged heart healthy diet such as the DASH diet and exercise as tolerated.       GERD      Avoid offending foods, start probiotics. Do not eat large meals in late evening and consider raising head of bed.       Insomnia    Encouraged good sleep hygiene such as dark, quiet room. No blue/green glowing lights such as computer screens in bedroom. No alcohol or stimulants in evening. Cut down on caffeine as able. Regular exercise is helpful but not just prior to bed time. Worse with cough, use Hydromet prn      Type 2 diabetes mellitus without complication, without long-term current use of insulin (HCC)    hgba1c acceptable, minimize simple carbs. Increase exercise as tolerated. Continue current meds      Acute non-recurrent maxillary sinusitis - Primary    Started on IM antibiotics and steroids and given oral to start tomorrow. Use albuterol prn, Hydromet prn      Relevant Medications   cefdinir (OMNICEF) 300 MG capsule   cefTRIAXone (ROCEPHIN) injection 500 mg   predniSONE (DELTASONE) 20 MG tablet   HYDROcodone-homatropine (HYCODAN) 5-1.5 MG/5ML syrup   methylPREDNISolone acetate (DEPO-MEDROL) injection 40 mg   Other Relevant Orders   CBC w/Diff   Comprehensive metabolic panel    Other Visit Diagnoses    Cough       Relevant Medications   predniSONE (DELTASONE) 20 MG tablet   HYDROcodone-homatropine (HYCODAN) 5-1.5 MG/5ML syrup   Other Relevant Orders   CBC w/Diff   Comprehensive metabolic panel      I am having Jaylenn T. Tomasita Crumble start on cefdinir, predniSONE, and HYDROcodone-homatropine. I am also having her maintain her Lancets, furosemide, lisinopril, Vitamin D (Ergocalciferol), glucose blood, Insulin Pen Needle, [place] [employer] Blood Glucose, rosuvastatin, doxycycline, albuterol, methylPREDNISolone, cetirizine, fluticasone, and metFORMIN. We will continue to administer cefTRIAXone and methylPREDNISolone acetate.  Meds ordered this encounter  Medications  . cefdinir (OMNICEF) 300 MG capsule    Sig: Take 1 capsule (300 mg total) by mouth 2 (two) times daily for  10 days.    Dispense:  20 capsule    Refill:  0  . cefTRIAXone (ROCEPHIN) injection 500 mg  . predniSONE (DELTASONE) 20 MG tablet    Sig: Take 1 tablet (20 mg total) by mouth 2 (two) times daily with a meal.    Dispense:  10 tablet    Refill:  0  . HYDROcodone-homatropine (HYCODAN) 5-1.5 MG/5ML syrup    Sig: Take 2.5-5 mLs by mouth at bedtime as needed for cough.    Dispense:  80 mL    Refill:  0  . methylPREDNISolone acetate (DEPO-MEDROL) injection 40 mg     Penni Homans, MD

## 2018-06-12 ENCOUNTER — Telehealth: Payer: Self-pay

## 2018-06-12 DIAGNOSIS — K7460 Unspecified cirrhosis of liver: Secondary | ICD-10-CM

## 2018-06-12 NOTE — Telephone Encounter (Signed)
-----   Message from Marlon Pel, RN sent at 12/12/2017  4:16 PM EST ----- Needs [name] and AFP see results 12/12/17 - [place] Plan

## 2018-06-12 NOTE — Telephone Encounter (Signed)
Patient is scheduled for 07/23/18 9:00 at [facility]. She is notified to be NPO after midnight and to arrive at 8:45.  She Is also notified to come for labs.

## 2018-07-14 ENCOUNTER — Emergency Department (HOSPITAL_COMMUNITY): Payer: Medicare HMO | Admitting: Anesthesiology

## 2018-07-14 ENCOUNTER — Emergency Department (HOSPITAL_BASED_OUTPATIENT_CLINIC_OR_DEPARTMENT_OTHER): Payer: Medicare HMO

## 2018-07-14 ENCOUNTER — Encounter (HOSPITAL_COMMUNITY)
Admission: EM | Disposition: A | Discharge status: Other Acute Inpatient Hospital | Payer: Self-pay | Source: Home / Self Care | Attending: Emergency Medicine

## 2018-07-14 ENCOUNTER — Emergency Department (HOSPITAL_BASED_OUTPATIENT_CLINIC_OR_DEPARTMENT_OTHER)
Admission: EM | Admit: 2018-07-14 | Discharge: 2018-07-15 | Payer: Medicare HMO | Attending: Emergency Medicine | Admitting: Emergency Medicine

## 2018-07-14 ENCOUNTER — Other Ambulatory Visit: Payer: Self-pay

## 2018-07-14 ENCOUNTER — Encounter (HOSPITAL_COMMUNITY): Payer: Self-pay | Admitting: Certified Registered"

## 2018-07-14 DIAGNOSIS — Z85828 Personal history of other malignant neoplasm of skin: Secondary | ICD-10-CM | POA: Insufficient documentation

## 2018-07-14 DIAGNOSIS — Z79899 Other long term (current) drug therapy: Secondary | ICD-10-CM | POA: Diagnosis not present

## 2018-07-14 DIAGNOSIS — E1122 Type 2 diabetes mellitus with diabetic chronic kidney disease: Secondary | ICD-10-CM | POA: Diagnosis not present

## 2018-07-14 DIAGNOSIS — S59901A Unspecified injury of right elbow, initial encounter: Secondary | ICD-10-CM | POA: Diagnosis not present

## 2018-07-14 DIAGNOSIS — Z96653 Presence of artificial knee joint, bilateral: Secondary | ICD-10-CM | POA: Diagnosis not present

## 2018-07-14 DIAGNOSIS — M25511 Pain in right shoulder: Secondary | ICD-10-CM | POA: Diagnosis not present

## 2018-07-14 DIAGNOSIS — Y999 Unspecified external cause status: Secondary | ICD-10-CM | POA: Insufficient documentation

## 2018-07-14 DIAGNOSIS — Z7984 Long term (current) use of oral hypoglycemic drugs: Secondary | ICD-10-CM | POA: Insufficient documentation

## 2018-07-14 DIAGNOSIS — S43014A Anterior dislocation of right humerus, initial encounter: Secondary | ICD-10-CM | POA: Insufficient documentation

## 2018-07-14 DIAGNOSIS — Y93H2 Activity, gardening and landscaping: Secondary | ICD-10-CM | POA: Diagnosis not present

## 2018-07-14 DIAGNOSIS — N189 Chronic kidney disease, unspecified: Secondary | ICD-10-CM | POA: Diagnosis not present

## 2018-07-14 DIAGNOSIS — W010XXA Fall on same level from slipping, tripping and stumbling without subsequent striking against object, initial encounter: Secondary | ICD-10-CM | POA: Diagnosis not present

## 2018-07-14 DIAGNOSIS — Y92007 Garden or yard of unspecified non-institutional (private) residence as the place of occurrence of the external cause: Secondary | ICD-10-CM | POA: Diagnosis not present

## 2018-07-14 DIAGNOSIS — I129 Hypertensive chronic kidney disease with stage 1 through stage 4 chronic kidney disease, or unspecified chronic kidney disease: Secondary | ICD-10-CM | POA: Diagnosis not present

## 2018-07-14 DIAGNOSIS — S43004A Unspecified dislocation of right shoulder joint, initial encounter: Secondary | ICD-10-CM | POA: Diagnosis not present

## 2018-07-14 DIAGNOSIS — Z419 Encounter for procedure for purposes other than remedying health state, unspecified: Secondary | ICD-10-CM

## 2018-07-14 HISTORY — PX: SHOULDER CLOSED REDUCTION: SHX1051

## 2018-07-14 SURGERY — CLOSED REDUCTION, SHOULDER
Anesthesia: General | Laterality: Right

## 2018-07-14 MED ORDER — SUCCINYLCHOLINE CHLORIDE 20 MG/ML IJ SOLN
INTRAMUSCULAR | Status: DC | PRN
  Administered 2018-07-14: 20 mg via INTRAVENOUS

## 2018-07-14 MED ORDER — ONDANSETRON HCL 4 MG/2ML IJ SOLN
INTRAMUSCULAR | Status: AC
  Administered 2018-07-14: 4 mg via INTRAVENOUS
  Filled 2018-07-14: qty 2

## 2018-07-14 MED ORDER — MORPHINE SULFATE (PF) 4 MG/ML IV SOLN
4.0000 mg | Freq: Once | INTRAVENOUS | Status: AC
  Administered 2018-07-14: 4 mg via INTRAVENOUS

## 2018-07-14 MED ORDER — LIDOCAINE HCL (CARDIAC) PF 100 MG/5ML IV SOSY
PREFILLED_SYRINGE | INTRAVENOUS | Status: DC | PRN
  Administered 2018-07-14: 40 mg via INTRATRACHEAL

## 2018-07-14 MED ORDER — MORPHINE SULFATE (PF) 4 MG/ML IV SOLN
INTRAVENOUS | Status: AC
  Administered 2018-07-14: 4 mg via INTRAVENOUS
  Filled 2018-07-14: qty 1

## 2018-07-14 MED ORDER — FENTANYL CITRATE (PF) 250 MCG/5ML IJ SOLN
INTRAMUSCULAR | Status: DC | PRN
  Administered 2018-07-14: 25 ug via INTRAVENOUS

## 2018-07-14 MED ORDER — LACTATED RINGERS IV SOLN
INTRAVENOUS | Status: DC | PRN
  Administered 2018-07-14: 23:00:00 via INTRAVENOUS

## 2018-07-14 MED ORDER — LACTATED RINGERS IV SOLN: INTRAVENOUS | Status: DC | PRN

## 2018-07-14 MED ORDER — PROPOFOL 10 MG/ML IV BOLUS
240.0000 mg | Freq: Once | INTRAVENOUS | Status: AC
  Administered 2018-07-14: 240 mg via INTRAVENOUS

## 2018-07-14 MED ORDER — ONDANSETRON HCL 4 MG/2ML IJ SOLN
INTRAMUSCULAR | Status: DC | PRN
  Administered 2018-07-14: 4 mg via INTRAVENOUS

## 2018-07-14 MED ORDER — MORPHINE SULFATE (PF) 4 MG/ML IV SOLN
4.0000 mg | Freq: Once | INTRAVENOUS | Status: AC
  Administered 2018-07-14: 4 mg via INTRAVENOUS
  Filled 2018-07-14: qty 1

## 2018-07-14 MED ORDER — ACETAMINOPHEN 160 MG/5ML PO SOLN: 325.0000 mg | ORAL | Status: DC | PRN

## 2018-07-14 MED ORDER — PROPOFOL 10 MG/ML IV BOLUS
INTRAVENOUS | Status: DC | PRN
  Administered 2018-07-14: 20 mg via INTRAVENOUS
  Administered 2018-07-14: 30 mg via INTRAVENOUS
  Administered 2018-07-14: 110 mg via INTRAVENOUS

## 2018-07-14 MED ORDER — KETOROLAC TROMETHAMINE 30 MG/ML IJ SOLN
INTRAMUSCULAR | Status: DC | PRN
  Administered 2018-07-14: 30 mg via INTRAVENOUS

## 2018-07-14 MED ORDER — ACETAMINOPHEN 325 MG PO TABS: 325.0000 mg | ORAL_TABLET | ORAL | Status: DC | PRN

## 2018-07-14 MED ORDER — FENTANYL CITRATE (PF) 100 MCG/2ML IJ SOLN: 25.0000 ug | INTRAMUSCULAR | Status: DC | PRN

## 2018-07-14 MED ORDER — PROPOFOL 10 MG/ML IV BOLUS
INTRAVENOUS | Status: AC
  Filled 2018-07-14: qty 20

## 2018-07-14 MED ORDER — FENTANYL CITRATE (PF) 250 MCG/5ML IJ SOLN
INTRAMUSCULAR | Status: AC
  Filled 2018-07-14: qty 5

## 2018-07-14 MED ORDER — HYDROCODONE-ACETAMINOPHEN 5-325 MG PO TABS: 1.0000 | ORAL_TABLET | Freq: Four times a day (QID) | ORAL | 0 refills | Status: DC | PRN

## 2018-07-14 MED ORDER — ONDANSETRON HCL 4 MG/2ML IJ SOLN
4.0000 mg | Freq: Once | INTRAMUSCULAR | Status: AC
  Administered 2018-07-14: 4 mg via INTRAVENOUS

## 2018-07-14 MED ORDER — DEXAMETHASONE SODIUM PHOSPHATE 10 MG/ML IJ SOLN
INTRAMUSCULAR | Status: DC | PRN
  Administered 2018-07-14: 10 mg via INTRAVENOUS

## 2018-07-14 MED ORDER — PROPOFOL 10 MG/ML IV BOLUS
1.0000 mg/kg | Freq: Once | INTRAVENOUS | Status: DC
  Filled 2018-07-14: qty 20

## 2018-07-14 NOTE — Anesthesia Preprocedure Evaluation (Signed)
Anesthesia Evaluation  Patient identified by MRN, date of birth, ID band Patient awake    Reviewed: Allergy & Precautions, NPO status , Patient's Chart, lab work & pertinent test results  History of Anesthesia Complications Negative for: history of anesthetic complications  Airway Mallampati: II  TM Distance: >3 FB Neck ROM: Full    Dental  (+) Upper Dentures, Lower Dentures   Pulmonary neg pulmonary ROS,    breath sounds clear to auscultation       Cardiovascular hypertension, Pt. on medications (-) angina(-) Past MI and (-) CHF  Rhythm:Regular     Neuro/Psych  Neuromuscular disease negative psych ROS   GI/Hepatic GERD  Controlled,  Endo/Other  diabetes, Type 2Hypothyroidism   Renal/GU Nl Cr     Musculoskeletal Dislocated right shoulder   Abdominal   Peds  Hematology   Anesthesia Other Findings   Reproductive/Obstetrics                             Anesthesia Physical Anesthesia Plan  ASA: II  Anesthesia Plan: General   Post-op Pain Management:    Induction: Intravenous  PONV Risk Score and Plan: 3 and Ondansetron and Dexamethasone  Airway Management Planned: Mask  Additional Equipment: None  Intra-op Plan:   Post-operative Plan:   Informed Consent: I have reviewed the patients History and Physical, chart, labs and discussed the procedure including the risks, benefits and alternatives for the proposed anesthesia with the patient or authorized representative who has indicated his/her understanding and acceptance.   Dental advisory given  Plan Discussed with: CRNA and Surgeon  Anesthesia Plan Comments:         Anesthesia Quick Evaluation

## 2018-07-14 NOTE — Anesthesia Procedure Notes (Signed)
Procedure Name: General with mask airway Date/Time: 07/14/2018 11:45 PM Performed by: Babs Bertin, CRNA Pre-anesthesia Checklist: Patient identified, Emergency Drugs available, Suction available, Patient being monitored and Timeout performed Patient Re-evaluated:Patient Re-evaluated prior to induction Oxygen Delivery Method: Circle system utilized Preoxygenation: Pre-oxygenation with 100% oxygen Induction Type: IV induction Ventilation: Mask ventilation without difficulty

## 2018-07-14 NOTE — Sedation Documentation (Signed)
Portable Post reduction xray in progress.

## 2018-07-14 NOTE — ED Notes (Signed)
Report given to Gerald Stabs, RN at [facility] ED.

## 2018-07-14 NOTE — ED Provider Notes (Signed)
[facility] EMERGENCY DEPARTMENT Provider Note   CSN: [id number] Arrival date & time: 07/14/18  1832     History   Chief Complaint Chief Complaint  Patient presents with  . Fall  . Shoulder Injury    HPI Stacey Cooper is a 83 y.o. female.  Patient presents to the emergency department with acute onset of right shoulder and elbow pain after falling while working in a garden.  Patient states that the ground was tilled and uneven and she lost her balance and fell onto her right side.  She did not hit her head and she denies history of anticoagulation.  No neck pain.  Pain is worse with any movement and is severe.  No treatments prior to arrival.  No distal numbness or tingling.  Patient denies history of heart or lung problems.  No chest pain or abdominal pain.  No vomiting or vision change.  The onset of this condition was acute. The course is constant. Aggravating factors: movement. Alleviating factors: none.       Past Medical History:  Diagnosis Date  . ABDOMINAL PAIN, RIGHT UPPER QUADRANT, HX OF 01/24/2011  . Actinic keratoses 07/18/2011  . Arthralgia 05/03/2015  . Back pain 12/08/2011  . Basal cell carcinoma of skin of lip 01/24/2011  . Blood transfusion without reported diagnosis   . Bronchitis 12/20/2012  . Cataract    bilataeral cateracts removed  . CHOLELITHIASIS, HX OF 02/07/2011  . Chronic kidney disease   . Diabetes mellitus   . Diabetes mellitus type 2 in obese [facility]) 01/24/2011   Qualifier: Diagnosis of  By: [name], [name]  Annual [facility] exam with [facility] on 06/14/13   . DM 01/24/2011  . Essential hypertension, benign 01/24/2011  . Fatty liver   . GERD 01/24/2011  . Grief reaction 01/20/2014  . HAIR LOSS 01/24/2011  . Hearing loss of both ears 06/28/2013  . Hepatic cirrhosis ([place]) 04/27/2015  . Hernia, abdominal   . Hyperlipidemia 04/24/2012  . Hyperlipidemia, mixed 04/24/2012  . Insomnia 07/24/2012  . Joint pain   . Leg cramps 06/06/2013  . MEASLES,  HX OF 01/24/2011  . Morbid obesity ([place]) 01/24/2011  . NONSPEC ELEVATION OF LEVELS OF TRANSAMINASE/LDH 02/07/2011  . Peripheral neuropathy 04/20/2011  . Preventative health care 08/23/2016  . Right hip pain 05/03/2015  . SCC (squamous cell carcinoma), face 07/18/2011  . SQUAMOUS CELL CARCINOMA OTHER SPEC SITES SKIN 01/24/2011  . Swelling   . Tubular adenoma of colon 04/2015  . Umbilical hernia 01/25/8656  . URINARY INCONTINENCE 02/07/2011  . Urinary tract infection, site not specified 04/30/2014  . UTI'S, HX OF 01/24/2011  . Vulvar atrophy 03/17/2017    Patient Active Problem List   Diagnosis Date Noted  . Acute non-recurrent maxillary sinusitis 06/06/2018  . Allergic rhinitis 05/08/2018  . Abnormal TSH 03/12/2018  . Subclinical hypothyroidism 01/30/2018  . Type 2 diabetes mellitus without complication, without long-term current use of insulin ([place]) 09/26/2017  . Type 2 diabetes mellitus with hypoglycemia without coma, without long-term current use of insulin ([place]) 09/26/2017  . Vitamin D deficiency 08/10/2017  . Vulvar atrophy 03/17/2017  . Preventative health care 08/23/2016  . Umbilical hernia 84/69/6295  . Left wrist pain 11/20/2015  . Postmenopausal estrogen deficiency 11/20/2015  . Arthralgia 05/03/2015  . Hepatic cirrhosis ([place]) 04/27/2015  . Hypokalemia 10/19/2014  . Dermatitis 06/28/2014  . Grief reaction 01/20/2014  . Medicare annual wellness visit, subsequent 06/28/2013  . Hearing loss of both ears 06/28/2013  .  Leg cramps 06/06/2013  . Bronchitis 12/20/2012  . Insomnia 07/24/2012  . Hyperlipidemia, mixed 04/24/2012  . Back pain 12/08/2011  . SCC (squamous cell carcinoma), face 07/18/2011  . Actinic keratoses 07/18/2011  . Peripheral neuropathy 04/20/2011  . URINARY INCONTINENCE 02/07/2011  . NONSPEC ELEVATION OF LEVELS OF TRANSAMINASE/LDH 02/07/2011  . CHOLELITHIASIS, HX OF 02/07/2011  . BASAL CELL CARCINOMA OF SKIN OF LIP 01/24/2011  . Type 2 diabetes mellitus without  complication, with long-term current use of insulin ([place]) 01/24/2011  . Morbid obesity ([place]) 01/24/2011  . ESSENTIAL HYPERTENSION, BENIGN 01/24/2011  . GERD 01/24/2011  . Alopecia 01/24/2011  . MEASLES, HX OF 01/24/2011  . UTI'S, HX OF 01/24/2011  . History of other specified conditions presenting hazards to health 01/24/2011    Past Surgical History:  Procedure Laterality Date  . ABDOMINAL HYSTERECTOMY     partial, both ovaries left in place  . BCC removed below lower lip on left  26 yrs ago  . cataract extraction, b/l    . COLONOSCOPY    . Left knee arthroscopy and cleaning prior to TKR    . skin bx, right anterior CW , squamous cancer, had to redo to margins  12/2010  . total knee replacement, b/l       OB History    Gravida  1   Para  1   Term      Preterm      AB      Living        SAB      TAB      Ectopic      Multiple      Live Births  1            Home Medications    Prior to Admission medications   Medication Sig Start Date End Date Taking? Authorizing Provider  albuterol (PROVENTIL HFA;VENTOLIN HFA) 108 (90 Base) MCG/ACT inhaler Inhale 2 puffs into the lungs every 6 (six) hours as needed for wheezing or shortness of breath. 05/08/18   Mosie Lukes, MD  Blood Glucose Monitoring Suppl (PRODIGY AUTOCODE BLOOD GLUCOSE) w/Device KIT 1 Device by Does not apply route 3 (three) times daily between meals as needed. 02/19/18   Mosie Lukes, MD  cetirizine (ZYRTEC) 10 MG tablet Take 1 tablet (10 mg total) by mouth daily as needed for allergies. 05/08/18   Mosie Lukes, MD  doxycycline (VIBRA-TABS) 100 MG tablet Take 1 tablet (100 mg total) by mouth 2 (two) times daily. 05/08/18   Mosie Lukes, MD  fluticasone (FLONASE) 50 MCG/ACT nasal spray Place 2 sprays into both nostrils daily. 05/08/18   Mosie Lukes, MD  furosemide (LASIX) 20 MG tablet Take 1 tablet (20 mg total) by mouth daily. 09/18/17   Mosie Lukes, MD  glucose blood (FREESTYLE LITE)  test strip USE TO TEST BLOOD SUGAR TWICE DAILY AS DIRECTED 02/19/18   Mosie Lukes, MD  HYDROcodone-homatropine [facility]) 5-1.5 MG/5ML syrup Take 2.5-5 mLs by mouth at bedtime as needed for cough. 06/05/18   Mosie Lukes, MD  Insulin Pen Needle (NOVOFINE) 32G X 6 MM MISC Use with Victoza 02/19/18   Mosie Lukes, MD  Lancets MISC by Does not apply route. Check blood sugars qam and once daily prn     [provider]  lisinopril (PRINIVIL,ZESTRIL) 5 MG tablet TAKE 1 TABLET(5 MG) BY MOUTH DAILY 09/18/17   Mosie Lukes, MD  metFORMIN (GLUCOPHAGE) 500 MG tablet 2  tabs po bid and 1 tab po q noon 05/08/18   Mosie Lukes, MD  methylPREDNISolone (MEDROL) 4 MG tablet 5 tab po qd X 1d then 4 tab po qd X 1d then 3 tab po qd X 1d then 2 tab po qd then 1 tab po qd 05/08/18   Mosie Lukes, MD  predniSONE (DELTASONE) 20 MG tablet Take 1 tablet (20 mg total) by mouth 2 (two) times daily with a meal. 06/05/18   Mosie Lukes, MD  rosuvastatin (CRESTOR) 5 MG tablet TAKE ONE TABLET EVERY OTHER DAY. 03/12/18   Mosie Lukes, MD  Vitamin D, Ergocalciferol, (DRISDOL) 50000 units CAPS capsule Take 1 capsule (50,000 Units total) by mouth as directed. Take one tab every 14 days 12/13/17   Waldon Merl, PA-C    Family History Family History  Problem Relation Age of Onset  . Arthritis Mother        RA  . Myelodysplastic syndrome Mother   . Heart disease Mother   . Cancer Father        skin  . Stroke Father   . Colon polyps Father        17 in [place] intes removed carcinoid tumor confined in the tumor  . Stroke Maternal Grandmother   . Kidney failure Maternal Grandfather   . Esophageal cancer Neg Hx   . Rectal cancer Neg Hx   . Stomach cancer Neg Hx     Social History Social History   Tobacco Use  . Smoking status: Never Smoker  . Smokeless tobacco: Never Used  Substance Use Topics  . Alcohol use: No    Alcohol/week: 0.0 oz  . Drug use: No     Allergies   Contrast media  [iodinated diagnostic agents]; Doxycycline; and Lovastatin   Review of Systems Review of Systems  Constitutional: Negative for activity change and fatigue.  HENT: Negative for tinnitus.   Eyes: Negative for photophobia, pain and visual disturbance.  Respiratory: Negative for shortness of breath.   Cardiovascular: Negative for chest pain.  Gastrointestinal: Negative for nausea and vomiting.  Musculoskeletal: Positive for arthralgias. Negative for back pain, gait problem, joint swelling and neck pain.  Skin: Negative for wound.  Neurological: Negative for dizziness, weakness, light-headedness, numbness and headaches.  Psychiatric/Behavioral: Negative for confusion and decreased concentration.     Physical Exam Updated Vital Signs BP (!) 144/67 (BP Location: Left Arm)   Pulse 85   Temp 97.7 F (36.5 C) (Oral)   Resp 16   Ht 5' 3"  (1.6 m)   Wt 81.6 kg (180 lb)   SpO2 96%   BMI 31.89 kg/m   Physical Exam  Constitutional: She appears well-developed and well-nourished.  HENT:  Head: Normocephalic and atraumatic.  Eyes: Pupils are equal, round, and reactive to light.  Neck: Normal range of motion. Neck supple.  Cardiovascular: Normal pulses. Exam reveals no decreased pulses.  Musculoskeletal: She exhibits tenderness. She exhibits no edema.       Right shoulder: She exhibits decreased range of motion, tenderness and bony tenderness.       Left shoulder: Normal.       Right elbow: She exhibits decreased range of motion. Tenderness found. No radial head tenderness noted.       Right wrist: Normal.       Cervical back: Normal.       Right upper arm: She exhibits tenderness. She exhibits no bony tenderness, no swelling and no edema.  Right forearm: Normal.       Right hand: Normal.  Neurological: She is alert. No sensory deficit.  Motor, sensation, and vascular distal to the injury is fully intact.   Skin: Skin is warm and dry.  Psychiatric: She has a normal mood and affect.   Nursing note and vitals reviewed.    ED Treatments / Results  Labs (all labs ordered are listed, but only abnormal results are displayed) Labs Reviewed - No data to display  EKG None  Radiology Dg Shoulder Right  Result Date: 07/14/2018 CLINICAL DATA:  Right shoulder pain after fall EXAM: RIGHT SHOULDER - 2+ VIEW COMPARISON:  CXR 05/08/2018 FINDINGS: Acute anterior subcoracoid humeral head dislocation with probable Hill-Sachs deformity. No definite bony [place] lesion is identified. The [facility] joint is maintained. The adjacent ribs and lung are nonacute. IMPRESSION: Acute anterior subcoracoid humeral head dislocation with probable Hill-Sachs deformity. No definite bony [place] lesion of the glenoid. Electronically Signed   By: Ashley Royalty M.D.   On: 07/14/2018 19:39   Dg Elbow Complete Right  Result Date: 07/14/2018 CLINICAL DATA:  Patient fell injuring the right elbow. Patient is unable to fully straighten of the elbow or rotate. EXAM: RIGHT ELBOW - COMPLETE 3+ VIEW COMPARISON:  None. FINDINGS: There is no evidence of fracture, dislocation, or joint effusion. There is no evidence of arthropathy or other focal bone abnormality. Soft tissues are unremarkable. IMPRESSION: No acute fracture, joint effusion or dislocation of the right elbow. Electronically Signed   By: Ashley Royalty M.D.   On: 07/14/2018 19:41   Dg Shoulder Right Portable  Result Date: 07/14/2018 CLINICAL DATA:  Follow-up examination status post reduction. EXAM: PORTABLE RIGHT SHOULDER COMPARISON:  Prior radiograph from earlier same day. FINDINGS: Persistent anterior inferior dislocation of the right humeral head status post attempted reduction. Probable Hill-Sachs deformity again noted. No definite new fracture. Osteoarthritic changes about the right AC joint. IMPRESSION: Persistent anterior inferior dislocation of the right humeral head relative to the glenoid status post attempted reduction. Electronically Signed   By: Jeannine Boga M.D.   On: 07/14/2018 21:49    Procedures Procedures (including critical care time)  Medications Ordered in ED Medications  propofol (DIPRIVAN) 10 mg/mL bolus/IV push 81.6 mg (81.6 mg Intravenous See Procedure Record 07/14/18 2041)  morphine 4 MG/ML injection 4 mg (4 mg Intravenous Given 07/14/18 1901)  ondansetron (ZOFRAN) injection 4 mg (4 mg Intravenous Given 07/14/18 1901)  morphine 4 MG/ML injection 4 mg (4 mg Intravenous Given 07/14/18 2150)  propofol (DIPRIVAN) 10 mg/mL bolus/IV push 240 mg (240 mg Intravenous Given 07/14/18 2041)     Initial Impression / Assessment and Plan / ED Course  I have reviewed the triage vital signs and the nursing notes.  Pertinent labs & imaging results that were available during my care of the patient were reviewed by me and considered in my medical decision making (see chart for details).     Patient seen and examined. Work-up initiated. Medications ordered.   Vital signs reviewed and are as follows: BP (!) 144/67 (BP Location: Left Arm)   Pulse 85   Temp 97.7 F (36.5 C) (Oral)   Resp 16   Ht 5' 3"  (1.6 m)   Wt 81.6 kg (180 lb)   SpO2 96%   BMI 31.89 kg/m   9:54 PM Unable to reduce shoulder after multiple attempts.   I spoke with Dr. Erlinda Hong. He requests patient be transferred to [facility] ED for possible reduction in OR.  Dr. Erlinda Hong will need notified when patient arrives.   Arm remains neurovascularly intact.   10:19 PM Dr. Laverta Baltimore made aware of patient's transfer.   Final Clinical Impressions(s) / ED Diagnoses   Final diagnoses:  Anterior dislocation of right shoulder, initial encounter   Transfer to [facility] given unable to reduce shoulder.   ED Discharge Orders    None       Carlisle Cater, Hershal Coria 07/14/18 2220    Tegeler, Gwenyth Allegra, MD 07/15/18 [phone]

## 2018-07-14 NOTE — Op Note (Signed)
   Date of Surgery: 07/14/2018  INDICATIONS: Stacey Cooper is a 83 y.o.-year-old female with a right shoulder dislocation;  The patient did consent to the procedure after discussion of the risks and benefits.  PREOPERATIVE DIAGNOSIS: Right anterior shoulder dislocation  POSTOPERATIVE DIAGNOSIS: Same.  PROCEDURE: Closed reduction of right shoulder dislocation under general anesthesia  SURGEON: N. Eduard Roux, M.D.  ANESTHESIA:  general  IV FLUIDS AND URINE: See anesthesia.  COMPLICATIONS: None.  DESCRIPTION OF PROCEDURE: The patient was brought to the operating room and placed supine on the operating table.  The patient had been signed prior to the procedure and this was documented. The patient had the anesthesia placed by the anesthesiologist.  A time-out was performed to confirm that this was the correct patient, site, side and location.     After adequate levels of anesthesia. Traction and counter traction was gently applied in order to reduce the shoulder with a palpable clunk.  AP and axillary xrays were taken to confirm concentric reduction.  She was then placed in a shoulder immobilizer.  She tolerated the procedure well.  POSTOPERATIVE PLAN: discharge home with sling at all times  N. Eduard Roux, MD [place] 11:55 PM

## 2018-07-14 NOTE — ED Notes (Signed)
Report given to [facility]. 

## 2018-07-14 NOTE — Sedation Documentation (Signed)
Pt awake, alert and talking on cell phone.

## 2018-07-14 NOTE — Sedation Documentation (Signed)
Sling placed on right arm.

## 2018-07-14 NOTE — ED Notes (Signed)
Contacted [facility] (Tammy) for transport

## 2018-07-14 NOTE — Sedation Documentation (Signed)
Friend at bedside.

## 2018-07-14 NOTE — Sedation Documentation (Signed)
Pt has skin tear to right forearm incurred while doctor was attempting to reduce right shoulder.  Tegaderm placed.

## 2018-07-14 NOTE — ED Triage Notes (Signed)
Pt c/o falling forward while in garden, landing on right shoulder.

## 2018-07-14 NOTE — Sedation Documentation (Signed)
2nd portable post reduction xray in progress.

## 2018-07-15 ENCOUNTER — Emergency Department (HOSPITAL_COMMUNITY): Payer: Medicare HMO

## 2018-07-15 ENCOUNTER — Encounter (HOSPITAL_COMMUNITY): Payer: Self-pay

## 2018-07-15 DIAGNOSIS — Z79899 Other long term (current) drug therapy: Secondary | ICD-10-CM | POA: Diagnosis not present

## 2018-07-15 DIAGNOSIS — I129 Hypertensive chronic kidney disease with stage 1 through stage 4 chronic kidney disease, or unspecified chronic kidney disease: Secondary | ICD-10-CM | POA: Diagnosis not present

## 2018-07-15 DIAGNOSIS — Z7984 Long term (current) use of oral hypoglycemic drugs: Secondary | ICD-10-CM | POA: Diagnosis not present

## 2018-07-15 DIAGNOSIS — W010XXA Fall on same level from slipping, tripping and stumbling without subsequent striking against object, initial encounter: Secondary | ICD-10-CM | POA: Diagnosis not present

## 2018-07-15 DIAGNOSIS — Z85828 Personal history of other malignant neoplasm of skin: Secondary | ICD-10-CM | POA: Diagnosis not present

## 2018-07-15 DIAGNOSIS — E1122 Type 2 diabetes mellitus with diabetic chronic kidney disease: Secondary | ICD-10-CM | POA: Diagnosis not present

## 2018-07-15 DIAGNOSIS — N189 Chronic kidney disease, unspecified: Secondary | ICD-10-CM | POA: Diagnosis not present

## 2018-07-15 DIAGNOSIS — Z96653 Presence of artificial knee joint, bilateral: Secondary | ICD-10-CM | POA: Diagnosis not present

## 2018-07-15 DIAGNOSIS — S43014A Anterior dislocation of right humerus, initial encounter: Secondary | ICD-10-CM

## 2018-07-15 DIAGNOSIS — S43004A Unspecified dislocation of right shoulder joint, initial encounter: Secondary | ICD-10-CM | POA: Diagnosis not present

## 2018-07-15 LAB — GLUCOSE, CAPILLARY: Glucose-Capillary: 210 mg/dL — ABNORMAL HIGH (ref 70–99)

## 2018-07-15 NOTE — Transfer of Care (Signed)
Immediate Anesthesia Transfer of Care Note  Patient: Stacey Cooper  Procedure(s) Performed: CLOSED REDUCTION SHOULDER, RIGHT (Right )  Patient Location: PACU  Anesthesia Type:General  Level of Consciousness: awake, alert  and oriented  Airway & Oxygen Therapy: Patient Spontanous Breathing and Patient connected to face mask oxygen  Post-op Assessment: Report given to RN and Post -op Vital signs reviewed and stable  Post vital signs: Reviewed and stable  Last Vitals:  Vitals Value Taken Time  BP 117/69 07/14/2018 11:59 PM  Temp    Pulse 83 07/15/2018 12:01 AM  Resp 15 07/15/2018 12:01 AM  SpO2 97 % 07/15/2018 12:01 AM  Vitals shown include unvalidated device data.  Last Pain:  Vitals:   07/14/18 2214  TempSrc:   PainSc: 5          Complications: No apparent anesthesia complications

## 2018-07-15 NOTE — H&P (Signed)
ORTHOPAEDIC HISTORY AND PHYSICAL   Chief Complaint: Right shoulder dislocation  HPI: Stacey Cooper is a 83 y.o. female who complains of right shoulder pain and dislocation s/p mechanical fall while working in her garden. She denies LOC, neck pain, abd pain.  Pain is severe with movement.  EDP was unsuccessful in reducing it in the ED.  Ortho consulted.  Past Medical History:  Diagnosis Date  . ABDOMINAL PAIN, RIGHT UPPER QUADRANT, HX OF 01/24/2011  . Actinic keratoses 07/18/2011  . Arthralgia 05/03/2015  . Back pain 12/08/2011  . Basal cell carcinoma of skin of lip 01/24/2011  . Blood transfusion without reported diagnosis   . Bronchitis 12/20/2012  . Cataract    bilataeral cateracts removed  . CHOLELITHIASIS, HX OF 02/07/2011  . Chronic kidney disease   . Diabetes mellitus   . Diabetes mellitus type 2 in obese [facility]) 01/24/2011   Qualifier: Diagnosis of  By: [name], [name]  Annual eye exam with [facility] on 06/14/13   . DM 01/24/2011  . Essential hypertension, benign 01/24/2011  . Fatty liver   . GERD 01/24/2011  . Grief reaction 01/20/2014  . HAIR LOSS 01/24/2011  . Hearing loss of both ears 06/28/2013  . Hepatic cirrhosis ([place]) 04/27/2015  . Hernia, abdominal   . Hyperlipidemia 04/24/2012  . Hyperlipidemia, mixed 04/24/2012  . Insomnia 07/24/2012  . Joint pain   . Leg cramps 06/06/2013  . MEASLES, HX OF 01/24/2011  . Morbid obesity ([place]) 01/24/2011  . NONSPEC ELEVATION OF LEVELS OF TRANSAMINASE/LDH 02/07/2011  . Peripheral neuropathy 04/20/2011  . Preventative health care 08/23/2016  . Right hip pain 05/03/2015  . SCC (squamous cell carcinoma), face 07/18/2011  . SQUAMOUS CELL CARCINOMA OTHER SPEC SITES SKIN 01/24/2011  . Swelling   . Tubular adenoma of colon 04/2015  . Umbilical hernia 08/04/1750  . URINARY INCONTINENCE 02/07/2011  . Urinary tract infection, site not specified 04/30/2014  . UTI'S, HX OF 01/24/2011  . Vulvar atrophy 03/17/2017   Past Surgical History:  Procedure  Laterality Date  . ABDOMINAL HYSTERECTOMY     partial, both ovaries left in place  . BCC removed below lower lip on left  26 yrs ago  . cataract extraction, b/l    . COLONOSCOPY    . Left knee arthroscopy and cleaning prior to TKR    . skin bx, right anterior CW , squamous cancer, had to redo to margins  12/2010  . total knee replacement, b/l     Social History   Socioeconomic History  . Marital status: Widowed    Spouse name: Not on file  . Number of children: Not on file  . Years of education: Not on file  . Highest education level: Not on file  Occupational History  . Occupation: retired  Scientific laboratory technician  . Financial resource strain: Not on file  . Food insecurity:    Worry: Not on file    Inability: Not on file  . Transportation needs:    Medical: Not on file    Non-medical: Not on file  Tobacco Use  . Smoking status: Never Smoker  . Smokeless tobacco: Never Used  Substance and Sexual Activity  . Alcohol use: No    Alcohol/week: 0.0 oz  . Drug use: No  . Sexual activity: Yes  Lifestyle  . Physical activity:    Days per week: Not on file    Minutes per session: Not on file  . Stress: Not on file  Relationships  .  Social connections:    Talks on phone: Not on file    Gets together: Not on file    Attends religious service: Not on file    Active member of club or organization: Not on file    Attends meetings of clubs or organizations: Not on file    Relationship status: Not on file  Other Topics Concern  . Not on file  Social History Narrative  . Not on file   Family History  Problem Relation Age of Onset  . Arthritis Mother        RA  . Myelodysplastic syndrome Mother   . Heart disease Mother   . Cancer Father        skin  . Stroke Father   . Colon polyps Father        36 in [place] intes removed carcinoid tumor confined in the tumor  . Stroke Maternal Grandmother   . Kidney failure Maternal Grandfather   . Esophageal cancer Neg Hx   . Rectal cancer  Neg Hx   . Stomach cancer Neg Hx    Allergies  Allergen Reactions  . Contrast Media [Iodinated Diagnostic Agents] Shortness Of Breath  . Doxycycline     Severe oral ulcers.  . Lovastatin     myalgias   Prior to Admission medications   Medication Sig Start Date End Date Taking? Authorizing Provider  albuterol (PROVENTIL HFA;VENTOLIN HFA) 108 (90 Base) MCG/ACT inhaler Inhale 2 puffs into the lungs every 6 (six) hours as needed for wheezing or shortness of breath. 05/08/18   Mosie Lukes, MD  Blood Glucose Monitoring Suppl (PRODIGY AUTOCODE BLOOD GLUCOSE) w/Device KIT 1 Device by Does not apply route 3 (three) times daily between meals as needed. 02/19/18   Mosie Lukes, MD  cetirizine (ZYRTEC) 10 MG tablet Take 1 tablet (10 mg total) by mouth daily as needed for allergies. 05/08/18   Mosie Lukes, MD  doxycycline (VIBRA-TABS) 100 MG tablet Take 1 tablet (100 mg total) by mouth 2 (two) times daily. 05/08/18   Mosie Lukes, MD  fluticasone (FLONASE) 50 MCG/ACT nasal spray Place 2 sprays into both nostrils daily. 05/08/18   Mosie Lukes, MD  furosemide (LASIX) 20 MG tablet Take 1 tablet (20 mg total) by mouth daily. 09/18/17   Mosie Lukes, MD  glucose blood (FREESTYLE LITE) test strip USE TO TEST BLOOD SUGAR TWICE DAILY AS DIRECTED 02/19/18   Mosie Lukes, MD  HYDROcodone-acetaminophen (NORCO) 5-325 MG tablet Take 1 tablet by mouth every 6 (six) hours as needed. 07/14/18   Leandrew Koyanagi, MD  HYDROcodone-homatropine [facility]) 5-1.5 MG/5ML syrup Take 2.5-5 mLs by mouth at bedtime as needed for cough. 06/05/18   Mosie Lukes, MD  Insulin Pen Needle (NOVOFINE) 32G X 6 MM MISC Use with Victoza 02/19/18   Mosie Lukes, MD  Lancets MISC by Does not apply route. Check blood sugars qam and once daily prn     [provider]  lisinopril (PRINIVIL,ZESTRIL) 5 MG tablet TAKE 1 TABLET(5 MG) BY MOUTH DAILY 09/18/17   Mosie Lukes, MD  metFORMIN (GLUCOPHAGE) 500 MG tablet 2 tabs po  bid and 1 tab po q noon 05/08/18   Mosie Lukes, MD  methylPREDNISolone (MEDROL) 4 MG tablet 5 tab po qd X 1d then 4 tab po qd X 1d then 3 tab po qd X 1d then 2 tab po qd then 1 tab po qd 05/08/18   Mosie Lukes,  MD  predniSONE (DELTASONE) 20 MG tablet Take 1 tablet (20 mg total) by mouth 2 (two) times daily with a meal. 06/05/18   Mosie Lukes, MD  rosuvastatin (CRESTOR) 5 MG tablet TAKE ONE TABLET EVERY OTHER DAY. 03/12/18   Mosie Lukes, MD  Vitamin D, Ergocalciferol, (DRISDOL) 50000 units CAPS capsule Take 1 capsule (50,000 Units total) by mouth as directed. Take one tab every 14 days 12/13/17   Waldon Merl, PA-C   Dg Shoulder Right  Result Date: 07/14/2018 CLINICAL DATA:  Right shoulder pain after fall EXAM: RIGHT SHOULDER - 2+ VIEW COMPARISON:  CXR 05/08/2018 FINDINGS: Acute anterior subcoracoid humeral head dislocation with probable Hill-Sachs deformity. No definite bony Bankart lesion is identified. The [facility] joint is maintained. The adjacent ribs and lung are nonacute. IMPRESSION: Acute anterior subcoracoid humeral head dislocation with probable Hill-Sachs deformity. No definite bony Bankart lesion of the glenoid. Electronically Signed   By: Ashley Royalty M.D.   On: 07/14/2018 19:39   Dg Elbow Complete Right  Result Date: 07/14/2018 CLINICAL DATA:  Patient fell injuring the right elbow. Patient is unable to fully straighten of the elbow or rotate. EXAM: RIGHT ELBOW - COMPLETE 3+ VIEW COMPARISON:  None. FINDINGS: There is no evidence of fracture, dislocation, or joint effusion. There is no evidence of arthropathy or other focal bone abnormality. Soft tissues are unremarkable. IMPRESSION: No acute fracture, joint effusion or dislocation of the right elbow. Electronically Signed   By: Ashley Royalty M.D.   On: 07/14/2018 19:41   Dg Shoulder Right Portable  Result Date: 07/14/2018 CLINICAL DATA:  Follow-up examination status post reduction. EXAM: PORTABLE RIGHT SHOULDER COMPARISON:  Prior  radiograph from earlier same day. FINDINGS: Persistent anterior inferior dislocation of the right humeral head status post attempted reduction. Probable Hill-Sachs deformity again noted. No definite new fracture. Osteoarthritic changes about the right AC joint. IMPRESSION: Persistent anterior inferior dislocation of the right humeral head relative to the glenoid status post attempted reduction. Electronically Signed   By: Jeannine Boga M.D.   On: 07/14/2018 21:49   - pertinent xrays, CT, MRI studies were reviewed and independently interpreted  Positive ROS: All other systems have been reviewed and were otherwise negative with the exception of those mentioned in the HPI and as above.  Physical Exam: General: Alert, no acute distress Cardiovascular: No pedal edema Respiratory: No cyanosis, no use of accessory musculature GI: No organomegaly, abdomen is soft and non-tender Skin: No lesions in the area of chief complaint Neurologic: Sensation intact distally Psychiatric: Patient is competent for consent with normal mood and affect Lymphatic: No axillary or cervical lymphadenopathy  MUSCULOSKELETAL:  - NVI - pain with movement of RUE  Assessment: Right shoulder dislocation  Plan: - plan is to perform closed reduction under general anesthesia - consent obtained  N. Eduard Roux, MD [place] 12:18 AM

## 2018-07-15 NOTE — Discharge Instructions (Signed)
Remain in shoulder sling at all times.    Shoulder Dislocation A shoulder dislocation happens when the upper arm bone (humerus) moves out of the shoulder joint. The shoulder joint is the part of the shoulder where the humerus, shoulder blade (scapula), and collarbone (clavicle) meet. What are the causes? This condition is often caused by:  A fall.  A hit to the shoulder.  A forceful movement of the shoulder.  What increases the risk? This condition is more likely to develop in people who play sports. What are the signs or symptoms? Symptoms of this condition include:  Deformity of the shoulder.  Intense pain.  Inability to move the shoulder.  Numbness, weakness, or tingling in your neck or down your arm.  Bruising or swelling around your shoulder.  How is this diagnosed? This condition is diagnosed with a physical exam. After the exam, tests may be done to check for related problems. Tests that may be done include:  X-ray. This may be done to check for broken bones.  MRI. This may be done to check for damage to the tissues around the shoulder.  Electromyogram. This may be done to check for nerve damage.  How is this treated? This condition is treated with a procedure to place the humerus back in the joint. This procedure is called a reduction. There are two types of reduction:  Closed reduction. In this procedure, the humerus is placed back in the joint without surgery. The health care provider uses his or her hands to guide the bone back into place.  Open reduction. In this procedure, the humerus is placed back in the joint with surgery. An open reduction may be recommended if: ? You have a weak shoulder joint or weak ligaments. ? You have had more than one shoulder dislocation. ? The nerves or blood vessels around your shoulder have been damaged.  After the humerus is placed back into the joint, your arm will be placed in a splint or sling to prevent it from moving.  You will need to wear the splint or sling until your shoulder heals. When the splint or sling is removed, you may have physical therapy to help improve the range of motion in your shoulder joint. Follow these instructions at home: If you have a splint or sling:  Wear it as told by your health care provider. Remove it only as told by your health care provider.  Loosen it if your fingers become numb and tingle, or if they turn cold and blue.  Keep it clean and dry. Bathing  Do not take baths, swim, or use a hot tub until your health care provider approves. Ask your health care provider if you can take showers. You may only be allowed to take sponge baths for bathing.  If your health care provider approves bathing and showering, cover your splint or sling with a watertight plastic bag to protect it from water. Do not let the splint or sling get wet. Managing pain, stiffness, and swelling  If directed, apply ice to the injured area. ? Put ice in a plastic bag. ? Place a towel between your skin and the bag. ? Leave the ice on for 20 minutes, 2-3 times per day.  Move your fingers often to avoid stiffness and to decrease swelling.  Raise (elevate) the injured area above the level of your heart while you are sitting or lying down. Driving  Do not drive while wearing a splint or sling on a hand that  you use for driving.  Do not drive or operate heavy machinery while taking pain medicine. Activity  Return to your normal activities as told by your health care provider. Ask your health care provider what activities are safe for you.  Perform range-of-motion exercises only as told by your health care provider.  Exercise your hand by squeezing a soft ball. This helps to decrease stiffness and swelling in your hand and wrist. General instructions  Take over-the-counter and prescription medicines only as told by your health care provider.  Do not use any tobacco products, including  cigarettes, chewing tobacco, or e-cigarettes. Tobacco can delay bone and tissue healing. If you need help quitting, ask your health care provider.  Keep all follow-up visits as told by your health care provider. This is important. Contact a health care provider if:  Your splint or sling gets damaged. Get help right away if:  Your pain gets worse rather than better.  You lose feeling in your arm or hand.  Your arm or hand becomes white and cold. This information is not intended to replace advice given to you by your health care provider. Make sure you discuss any questions you have with your health care provider. Document Released: 09/06/2001 Document Revised: 08/07/2016 Document Reviewed: 04/06/2015 Elsevier Interactive Patient Education  2018 [place].   Recurrent Shoulder Laxity and Instability Rehab After Surgery Ask your health care provider which exercises are safe for you. Do exercises exactly as told by your health care provider and adjust them as directed. It is normal to feel mild stretching, pulling, tightness, or discomfort as you do these exercises, but you should stop right away if you feel sudden pain or your pain gets worse. Do not begin these exercises until told by your health care provider. Stretching and range of motion exercises These exercises warm up your muscles and joints and improve the movement and flexibility of your shoulder. These exercises also help to relieve pain, numbness, and tingling. Exercise A: Pendulum  1. Stand near a wall or a surface that you can hold onto for balance. 2. Bend at the waist and let your left / right arm hang straight down. Use your other arm to keep your balance. 3. Relax your arm and shoulder muscles, and move your hips and your trunk so your left / right arm swings freely. Your arm should swing because of the motion of your body, not because you are using your arm or shoulder muscles. 4. Keep moving so your arm swings in the  following directions, as told by your health care provider: ? Side to side. ? Forward and backward. ? In clockwise and counterclockwise circles. Repeat __________ times, or for __________ seconds per direction. Complete this exercise __________ times a day. Exercise B: Shoulder flexion, active-assisted  1. Lie on your back. You may bend your knees for comfort. 2. Hold a broomstick, a cane, or a similar object so your hands are about shoulder-width apart on the object. Your palms should face toward your feet. 3. Raise your left / right arm over your head and behind your head, toward the floor. Use your other hand to help you do this. Stop when you feel a gentle stretch in your shoulder, or when you reach the angle that is recommended by your health care provider. 4. Hold for __________ seconds. 5. Use the broomstick and your other arm to help you return your left / right arm to the starting position. Repeat __________ times. Complete this exercise  __________ times a day. Exercise C: Active elbow extension 1. Sit in a chair without armrests, or stand. With your left / right elbow at your side, bend your elbow to an "L" shape (90 degrees). 2. Keep your left / right elbow at your side and straighten your elbow as much as you can, so your hand moves toward the floor. 3. Hold for __________ seconds. 4. Slowly return to the starting position. Repeat __________ times. Complete this exercise __________ times a day. Strengthening exercises These exercises build strength and endurance in your shoulder. Endurance is the ability to use your muscles for a long time, even after they get tired. Exercise D: Scapular retraction, isometric 1. Sit in a stable chair without armrests, or stand. 2. Squeeze your shoulder blades together and move your elbows slightly behind you. Do not shrug your shoulders. 3. Hold for __________ seconds. 4. Slowly return to the starting position. Repeat __________ times. Complete  this exercise __________ times a day. Exercise E: Shoulder flexion, isometric 1. Stand or sit about 4-6 inches (10-15 cm) away from a wall, and face the wall. 2. Gently make a fist and place your left / right hand on the wall so the top of your thumb and the side of your index finger touch the wall. 3. With your left / right elbow straight, gently press the top of your fist into the wall. Gradually increase the pressure until you are pressing as hard as you can without shrugging your shoulder. 4. Hold for __________ seconds. 5. Slowly release the tension and relax your muscles completely before you repeat the exercise. Repeat __________ times. Complete this exercise __________ times a day. Exercise F: Shoulder abduction, isometric  1. Stand or sit about 4-6 inches (10-15 cm) away from a wall with your right/left side facing the wall. 2. Bend your left / right elbow and gently press your elbow into the wall as if you are trying to move your arm out to your side. Increase the pressure gradually until you are pressing as hard as you can without shrugging your shoulder. 3. Hold for __________ seconds. 4. Slowly release the tension and relax your muscles completely before repeating the exercise. Repeat __________ times. Complete this exercise __________ times a day. Exercise G: External rotation, isometric  1. Stand or sit in a doorway, facing the door frame. 2. Bend your left / right elbow and place the back of your wrist against the door frame. Only the back of your wrist should be touching the frame. Keep your upper arm at your side. 3. Gently press your wrist into the door frame, as if you are trying to push your arm away from your abdomen. ? Avoid shrugging your shoulder while you press your wrist into the door frame. Keep your shoulder blade tucked down toward the middle of your back. 4. Hold for __________ seconds. 5. Slowly release the tension, and relax your muscles completely before you  repeat the exercise. Repeat __________ times. Complete this exercise __________ times a day. Exercise H: Internal rotation, isometric  1. Stand or sit in a doorway, facing the door frame. 2. Bend your left / right elbow and place the palm of your hand against the door frame. Only your palm should be touching the frame. Keep your upper arm at your side. 3. Gently press your hand into the door frame, as if you are trying to push your arm toward your abdomen. Do not let your wrist bend. ? Avoid shrugging your shoulder  while you press your hand into the door frame. Keep your shoulder blade tucked down toward the middle of your back. 4. Hold for __________ seconds. 5. Slowly release the tension, and relax your muscles completely before you repeat the exercise. Repeat __________ times. Complete this exercise __________ times a day. This information is not intended to replace advice given to you by your health care provider. Make sure you discuss any questions you have with your health care provider. Document Released: 12/12/2005 Document Revised: 08/18/2016 Document Reviewed: 12/26/2015 Elsevier Interactive Patient Education  2018 [place] [facility] Instructions  Activity: Get plenty of rest for the remainder of the day. A responsible individual must stay with you for 24 hours following the procedure.  For the next 24 hours, DO NOT: -Drive a car -Paediatric nurse -Drink alcoholic beverages -Take any medication unless instructed by your physician -Make any legal decisions or sign important papers.  Meals: Start with liquid foods such as gelatin or soup. Progress to regular foods as tolerated. Avoid greasy, spicy, heavy foods. If nausea and/or vomiting occur, drink only clear liquids until the nausea and/or vomiting subsides. Call your physician if vomiting continues.  Special Instructions/Symptoms: Your throat may feel dry or sore from the anesthesia or the breathing  tube placed in your throat during surgery. If this causes discomfort, gargle with warm salt water. The discomfort should disappear within 24 hours.  If you had a scopolamine patch placed behind your ear for the management of post- operative nausea and/or vomiting:  1. The medication in the patch is effective for 72 hours, after which it should be removed.  Wrap patch in a tissue and discard in the trash. Wash hands thoroughly with soap and water. 2. You may remove the patch earlier than 72 hours if you experience unpleasant side effects which may include dry mouth, dizziness or visual disturbances. 3. Avoid touching the patch. Wash your hands with soap and water after contact with the patch.

## 2018-07-16 ENCOUNTER — Encounter (HOSPITAL_COMMUNITY): Payer: Self-pay | Admitting: Orthopaedic Surgery

## 2018-07-20 ENCOUNTER — Ambulatory Visit (INDEPENDENT_AMBULATORY_CARE_PROVIDER_SITE_OTHER): Payer: Medicare HMO | Admitting: Orthopaedic Surgery

## 2018-07-20 ENCOUNTER — Ambulatory Visit (INDEPENDENT_AMBULATORY_CARE_PROVIDER_SITE_OTHER): Payer: Medicare HMO

## 2018-07-20 ENCOUNTER — Encounter (INDEPENDENT_AMBULATORY_CARE_PROVIDER_SITE_OTHER): Payer: Self-pay | Admitting: Orthopaedic Surgery

## 2018-07-20 DIAGNOSIS — M25511 Pain in right shoulder: Secondary | ICD-10-CM

## 2018-07-20 NOTE — Progress Notes (Signed)
Post-Op Visit Note   Patient: Stacey Cooper           Date of Birth: January 31, 1935           MRN: [id number] Visit Date: 07/20/2018 PCP: Mosie Lukes, MD   Assessment & Plan:  Chief Complaint:  Chief Complaint  Patient presents with  . Right Shoulder - Pain   Visit Diagnoses:  1. Acute pain of right shoulder     Plan: Patient is a pleasant 83 year old female who presents our clinic today 6 days status post closed reduction right shoulder dislocation from a mechanical fall, date of surgery 07/14/2018.  She has been very compliant wearing her sling at all times.  Minimal pain.  Examination of the right shoulder reveals no tenderness.  She is able to actively abduct against resistance. No swelling to the right upper extremity.  Neurovascularly intact distally.  At this point, she will continue wearing her sling to the right upper extremity for the next 2 weeks.  She will remain nonweightbearing as well.  She will follow-up with [name] at that time where we will initiate pendulum swings.  Call with concerns or questions in the meantime.  Follow-Up Instructions: Return in about 2 weeks (around 08/03/2018).   Orders:  Orders Placed This Encounter  Procedures  . XR Shoulder Right   No orders of the defined types were placed in this encounter.   Imaging: Xr Shoulder Right  Result Date: 07/20/2018 Stable shoulder reduction.  No other acute findings.   PMFS History: Patient Active Problem List   Diagnosis Date Noted  . Anterior dislocation of right shoulder   . Acute non-recurrent maxillary sinusitis 06/06/2018  . Allergic rhinitis 05/08/2018  . Abnormal TSH 03/12/2018  . Subclinical hypothyroidism 01/30/2018  . Type 2 diabetes mellitus without complication, without long-term current use of insulin ([place]) 09/26/2017  . Type 2 diabetes mellitus with hypoglycemia without coma, without long-term current use of insulin ([place]) 09/26/2017  . Vitamin D deficiency 08/10/2017  . Vulvar  atrophy 03/17/2017  . Preventative health care 08/23/2016  . Umbilical hernia 11/94/1740  . Left wrist pain 11/20/2015  . Postmenopausal estrogen deficiency 11/20/2015  . Arthralgia 05/03/2015  . Hepatic cirrhosis ([place]) 04/27/2015  . Hypokalemia 10/19/2014  . Dermatitis 06/28/2014  . Grief reaction 01/20/2014  . Medicare annual wellness visit, subsequent 06/28/2013  . Hearing loss of both ears 06/28/2013  . Leg cramps 06/06/2013  . Bronchitis 12/20/2012  . Insomnia 07/24/2012  . Hyperlipidemia, mixed 04/24/2012  . Back pain 12/08/2011  . SCC (squamous cell carcinoma), face 07/18/2011  . Actinic keratoses 07/18/2011  . Peripheral neuropathy 04/20/2011  . URINARY INCONTINENCE 02/07/2011  . NONSPEC ELEVATION OF LEVELS OF TRANSAMINASE/LDH 02/07/2011  . CHOLELITHIASIS, HX OF 02/07/2011  . BASAL CELL CARCINOMA OF SKIN OF LIP 01/24/2011  . Type 2 diabetes mellitus without complication, with long-term current use of insulin ([place]) 01/24/2011  . Morbid obesity ([place]) 01/24/2011  . ESSENTIAL HYPERTENSION, BENIGN 01/24/2011  . GERD 01/24/2011  . Alopecia 01/24/2011  . MEASLES, HX OF 01/24/2011  . UTI'S, HX OF 01/24/2011  . History of other specified conditions presenting hazards to health 01/24/2011   Past Medical History:  Diagnosis Date  . ABDOMINAL PAIN, RIGHT UPPER QUADRANT, HX OF 01/24/2011  . Actinic keratoses 07/18/2011  . Arthralgia 05/03/2015  . Back pain 12/08/2011  . Basal cell carcinoma of skin of lip 01/24/2011  . Blood transfusion without reported diagnosis   . Bronchitis 12/20/2012  . Cataract  bilataeral cateracts removed  . CHOLELITHIASIS, HX OF 02/07/2011  . Chronic kidney disease   . Diabetes mellitus   . Diabetes mellitus type 2 in obese [facility]) 01/24/2011   Qualifier: Diagnosis of  By: [name], [name]  Annual eye exam with [facility] on 06/14/13   . DM 01/24/2011  . Essential hypertension, benign 01/24/2011  . Fatty liver   . GERD 01/24/2011  . Grief  reaction 01/20/2014  . HAIR LOSS 01/24/2011  . Hearing loss of both ears 06/28/2013  . Hepatic cirrhosis ([place]) 04/27/2015  . Hernia, abdominal   . Hyperlipidemia 04/24/2012  . Hyperlipidemia, mixed 04/24/2012  . Insomnia 07/24/2012  . Joint pain   . Leg cramps 06/06/2013  . MEASLES, HX OF 01/24/2011  . Morbid obesity ([place]) 01/24/2011  . NONSPEC ELEVATION OF LEVELS OF TRANSAMINASE/LDH 02/07/2011  . Peripheral neuropathy 04/20/2011  . Preventative health care 08/23/2016  . Right hip pain 05/03/2015  . SCC (squamous cell carcinoma), face 07/18/2011  . SQUAMOUS CELL CARCINOMA OTHER SPEC SITES SKIN 01/24/2011  . Swelling   . Tubular adenoma of colon 04/2015  . Umbilical hernia 1/61/0960  . URINARY INCONTINENCE 02/07/2011  . Urinary tract infection, site not specified 04/30/2014  . UTI'S, HX OF 01/24/2011  . Vulvar atrophy 03/17/2017    Family History  Problem Relation Age of Onset  . Arthritis Mother        RA  . Myelodysplastic syndrome Mother   . Heart disease Mother   . Cancer Father        skin  . Stroke Father   . Colon polyps Father        17 in [place] intes removed carcinoid tumor confined in the tumor  . Stroke Maternal Grandmother   . Kidney failure Maternal Grandfather   . Esophageal cancer Neg Hx   . Rectal cancer Neg Hx   . Stomach cancer Neg Hx     Past Surgical History:  Procedure Laterality Date  . ABDOMINAL HYSTERECTOMY     partial, both ovaries left in place  . BCC removed below lower lip on left  26 yrs ago  . cataract extraction, b/l    . COLONOSCOPY    . Left knee arthroscopy and cleaning prior to TKR    . SHOULDER CLOSED REDUCTION Right 07/14/2018   Procedure: CLOSED REDUCTION SHOULDER, RIGHT;  Surgeon: Leandrew Koyanagi, MD;  Location: [facility];  Service: Orthopedics;  Laterality: Right;  . skin bx, right anterior CW , squamous cancer, had to redo to margins  12/2010  . total knee replacement, b/l     Social History   Occupational History  . Occupation: retired  Tobacco  Use  . Smoking status: Never Smoker  . Smokeless tobacco: Never Used  Substance and Sexual Activity  . Alcohol use: No    Alcohol/week: 0.0 oz  . Drug use: No  . Sexual activity: Yes

## 2018-07-23 ENCOUNTER — Encounter (HOSPITAL_COMMUNITY): Payer: Self-pay | Admitting: Orthopaedic Surgery

## 2018-07-23 ENCOUNTER — Other Ambulatory Visit: Payer: Medicare HMO

## 2018-07-23 ENCOUNTER — Ambulatory Visit (HOSPITAL_COMMUNITY)
Admission: RE | Admit: 2018-07-23 | Discharge: 2018-07-23 | Disposition: A | Discharge status: Home / Self Care | Payer: Medicare HMO | Source: Ambulatory Visit | Attending: Gastroenterology | Admitting: Gastroenterology

## 2018-07-23 DIAGNOSIS — K7460 Unspecified cirrhosis of liver: Secondary | ICD-10-CM

## 2018-07-23 DIAGNOSIS — K8020 Calculus of gallbladder without cholecystitis without obstruction: Secondary | ICD-10-CM | POA: Diagnosis not present

## 2018-07-23 DIAGNOSIS — K760 Fatty (change of) liver, not elsewhere classified: Secondary | ICD-10-CM | POA: Diagnosis not present

## 2018-07-23 NOTE — Anesthesia Postprocedure Evaluation (Signed)
Anesthesia Post Note  Patient: Stacey Cooper  Procedure(s) Performed: CLOSED REDUCTION SHOULDER, RIGHT (Right )     Patient location during evaluation: PACU Anesthesia Type: General Level of consciousness: awake and alert Pain management: pain level controlled Vital Signs Assessment: post-procedure vital signs reviewed and stable Respiratory status: spontaneous breathing, nonlabored ventilation, respiratory function stable and patient connected to nasal cannula oxygen Cardiovascular status: blood pressure returned to baseline and stable Postop Assessment: no apparent nausea or vomiting Anesthetic complications: no    Last Vitals:  Vitals:   07/15/18 0015 07/15/18 0021  BP: 128/61 126/78  Pulse: 75 86  Resp: 19 (!) 21  Temp:  36.5 C  SpO2: 92% 96%    Last Pain:  Vitals:   07/15/18 0015  TempSrc:   PainSc: 0-No pain                 Angee Gupton

## 2018-07-24 LAB — AFP TUMOR MARKER: AFP-Tumor Marker: 3.7 ng/mL

## 2018-08-07 ENCOUNTER — Ambulatory Visit (INDEPENDENT_AMBULATORY_CARE_PROVIDER_SITE_OTHER): Payer: Medicare HMO | Admitting: Orthopaedic Surgery

## 2018-08-07 ENCOUNTER — Encounter (INDEPENDENT_AMBULATORY_CARE_PROVIDER_SITE_OTHER): Payer: Self-pay | Admitting: Orthopaedic Surgery

## 2018-08-07 DIAGNOSIS — S43014D Anterior dislocation of right humerus, subsequent encounter: Secondary | ICD-10-CM

## 2018-08-07 NOTE — Progress Notes (Signed)
Tschirhart is 3 weeks status post reduction of a shoulder dislocation.  She is doing well and reports no pain.  She has been compliant with her physical exam is actually pretty encouraging.  She is able to actively abduct her shoulder without shrugging.  Forward flexion to 90 degrees.  Neurovascular intact.  At this point we will enroll her in physical therapy.  She may wean the sling as tolerated.  No heavy lifting.  Recheck in 6 weeks.

## 2018-08-09 ENCOUNTER — Ambulatory Visit (INDEPENDENT_AMBULATORY_CARE_PROVIDER_SITE_OTHER): Payer: Medicare HMO | Admitting: Family Medicine

## 2018-08-09 DIAGNOSIS — E782 Mixed hyperlipidemia: Secondary | ICD-10-CM | POA: Diagnosis not present

## 2018-08-09 DIAGNOSIS — E119 Type 2 diabetes mellitus without complications: Secondary | ICD-10-CM | POA: Diagnosis not present

## 2018-08-09 DIAGNOSIS — R69 Illness, unspecified: Secondary | ICD-10-CM | POA: Diagnosis not present

## 2018-08-09 DIAGNOSIS — E559 Vitamin D deficiency, unspecified: Secondary | ICD-10-CM | POA: Diagnosis not present

## 2018-08-09 DIAGNOSIS — R7989 Other specified abnormal findings of blood chemistry: Secondary | ICD-10-CM | POA: Diagnosis not present

## 2018-08-09 DIAGNOSIS — S43014D Anterior dislocation of right humerus, subsequent encounter: Secondary | ICD-10-CM

## 2018-08-09 DIAGNOSIS — I10 Essential (primary) hypertension: Secondary | ICD-10-CM | POA: Diagnosis not present

## 2018-08-09 DIAGNOSIS — S43004A Unspecified dislocation of right shoulder joint, initial encounter: Secondary | ICD-10-CM | POA: Diagnosis not present

## 2018-08-09 DIAGNOSIS — R32 Unspecified urinary incontinence: Secondary | ICD-10-CM

## 2018-08-09 LAB — LIPID PANEL
Cholesterol: 120 mg/dL (ref 0–200)
HDL: 49.1 mg/dL (ref >39.00)
LDL CALC: 49 mg/dL (ref 0–99)
NonHDL: 70.67
Total CHOL/HDL Ratio: 2
Triglycerides: 106 mg/dL (ref 0.0–149.0)
VLDL: 21.2 mg/dL (ref 0.0–40.0)

## 2018-08-09 LAB — CBC
HEMATOCRIT: 38.6 % (ref 36.0–46.0)
Hemoglobin: 13.1 g/dL (ref 12.0–15.0)
MCHC: 34 g/dL (ref 30.0–36.0)
MCV: 92.6 fl (ref 78.0–100.0)
Platelets: 209 10*3/uL (ref 150.0–400.0)
RBC: 4.17 Mil/uL (ref 3.87–5.11)
RDW: 13.4 % (ref 11.5–15.5)
WBC: 6.1 10*3/uL (ref 4.0–10.5)

## 2018-08-09 LAB — URINALYSIS, ROUTINE W REFLEX MICROSCOPIC
BILIRUBIN URINE: NEGATIVE
Hgb urine dipstick: NEGATIVE
Ketones, ur: NEGATIVE
Nitrite: NEGATIVE
PH: 5 (ref 5.0–8.0)
SPECIFIC GRAVITY, URINE: 1.025 (ref 1.000–1.030)
Total Protein, Urine: NEGATIVE
Urine Glucose: NEGATIVE
Urobilinogen, UA: 0.2 (ref 0.0–1.0)

## 2018-08-09 LAB — COMPREHENSIVE METABOLIC PANEL
ALT: 10 U/L (ref 0–35)
AST: 20 U/L (ref 0–37)
Albumin: 3.9 g/dL (ref 3.5–5.2)
Alkaline Phosphatase: 90 U/L (ref 39–117)
BUN: 20 mg/dL (ref 6–23)
CHLORIDE: 103 meq/L (ref 96–112)
CO2: 29 mEq/L (ref 19–32)
Calcium: 9.8 mg/dL (ref 8.4–10.5)
Creatinine, Ser: 0.85 mg/dL (ref 0.40–1.20)
GFR: 68.24 mL/min (ref >60.00)
Glucose, Bld: 138 mg/dL — ABNORMAL HIGH (ref 70–99)
POTASSIUM: 4.1 meq/L (ref 3.5–5.1)
SODIUM: 140 meq/L (ref 135–145)
Total Bilirubin: 0.8 mg/dL (ref 0.2–1.2)
Total Protein: 6.7 g/dL (ref 6.0–8.3)

## 2018-08-09 LAB — VITAMIN D 25 HYDROXY (VIT D DEFICIENCY, FRACTURES): VITD: 35.57 ng/mL (ref 30.00–100.00)

## 2018-08-09 LAB — HEMOGLOBIN A1C: Hgb A1c MFr Bld: 6.9 % — ABNORMAL HIGH (ref 4.6–6.5)

## 2018-08-09 LAB — TSH: TSH: 2.85 u[IU]/mL (ref 0.35–4.50)

## 2018-08-09 MED ORDER — ROSUVASTATIN CALCIUM 5 MG PO TABS: ORAL_TABLET | ORAL | 3 refills | Status: DC

## 2018-08-09 MED ORDER — GLUCOSE BLOOD VI STRP: ORAL_STRIP | 6 refills | Status: DC

## 2018-08-09 MED ORDER — LANCETS MISC: 3 refills | Status: AC

## 2018-08-09 NOTE — Assessment & Plan Note (Signed)
monitor

## 2018-08-09 NOTE — Assessment & Plan Note (Signed)
Well controlled, no changes to meds. Encouraged heart healthy diet such as the DASH diet and exercise as tolerated.  °

## 2018-08-09 NOTE — Assessment & Plan Note (Signed)
UA and Culture and Senstivity

## 2018-08-09 NOTE — Assessment & Plan Note (Signed)
Monitor and supplement 

## 2018-08-09 NOTE — Assessment & Plan Note (Signed)
Encouraged heart healthy diet, increase exercise, avoid trans fats, consider a krill oil cap daily 

## 2018-08-09 NOTE — Progress Notes (Signed)
Subjective:  I acted as a Education administrator for Dr. Charlett Blake. [place], [place]  Patient ID: Stacey Cooper, female    DOB: 03-20-1935, 83 y.o.   MRN: [id number]  No chief complaint on file.   HPI  Patient is in today for 3 month follow up and she is doing mostly doing w Denies CP/palp/SOB/HA/congestion/fevers/GI or GU c/o. Taking meds as prescribedll except she Dislocated on 7/20 when she fell in her garden. They tried to reduce it in ER but they were unsuccessful. She had to go back under anesthesia then it reduced easily. She was put in a sling and now she is out of it. She starts PT today but she is still sore so she is apprehensive and the copay is $40 so that is cost prohibitive. hgba1c acceptable, minimize simple carbs. Increase exercise as tolerated. Continue current meds. Her sugars got high up to 400 when she was on antibiotics and steroids. Mostly she stayed in 200s . Now that she is better her fasting sugar this morning was 122. No polydipsia but some polyuria. .  Patient Care Team: Mosie Lukes, MD as PCP - General (Family Medicine) Monna Fam, MD as Consulting Physician (Ophthalmology) Melrose Nakayama, MD as Consulting Physician (Orthopedic Surgery)   Past Medical History:  Diagnosis Date  . ABDOMINAL PAIN, RIGHT UPPER QUADRANT, HX OF 01/24/2011  . Actinic keratoses 07/18/2011  . Arthralgia 05/03/2015  . Back pain 12/08/2011  . Basal cell carcinoma of skin of lip 01/24/2011  . Blood transfusion without reported diagnosis   . Bronchitis 12/20/2012  . Cataract    bilataeral cateracts removed  . CHOLELITHIASIS, HX OF 02/07/2011  . Chronic kidney disease   . Diabetes mellitus   . Diabetes mellitus type 2 in obese [facility]) 01/24/2011   Qualifier: Diagnosis of  By: [name], [name]  Annual [facility] exam with [facility] on 06/14/13   . DM 01/24/2011  . Essential hypertension, benign 01/24/2011  . Fatty liver   . GERD 01/24/2011  . Grief reaction 01/20/2014  . HAIR LOSS 01/24/2011  . Hearing  loss of both ears 06/28/2013  . Hepatic cirrhosis ([place]) 04/27/2015  . Hernia, abdominal   . Hyperlipidemia 04/24/2012  . Hyperlipidemia, mixed 04/24/2012  . Insomnia 07/24/2012  . Joint pain   . Leg cramps 06/06/2013  . MEASLES, HX OF 01/24/2011  . Morbid obesity ([place]) 01/24/2011  . NONSPEC ELEVATION OF LEVELS OF TRANSAMINASE/LDH 02/07/2011  . Peripheral neuropathy 04/20/2011  . Preventative health care 08/23/2016  . Right hip pain 05/03/2015  . SCC (squamous cell carcinoma), face 07/18/2011  . SQUAMOUS CELL CARCINOMA OTHER SPEC SITES SKIN 01/24/2011  . Swelling   . Tubular adenoma of colon 04/2015  . Umbilical hernia 08/26/5175  . URINARY INCONTINENCE 02/07/2011  . Urinary tract infection, site not specified 04/30/2014  . UTI'S, HX OF 01/24/2011  . Vulvar atrophy 03/17/2017    Past Surgical History:  Procedure Laterality Date  . ABDOMINAL HYSTERECTOMY     partial, both ovaries left in place  . BCC removed below lower lip on left  26 yrs ago  . cataract extraction, b/l    . COLONOSCOPY    . Left knee arthroscopy and cleaning prior to TKR    . SHOULDER CLOSED REDUCTION Right 07/14/2018   Procedure: CLOSED REDUCTION SHOULDER, RIGHT;  Surgeon: Leandrew Koyanagi, MD;  Location: [facility];  Service: Orthopedics;  Laterality: Right;  . skin bx, right anterior CW , squamous cancer, had to redo to margins  12/2010  . total knee replacement, b/l      Family History  Problem Relation Age of Onset  . Arthritis Mother        RA  . Myelodysplastic syndrome Mother   . Heart disease Mother   . Cancer Father        skin  . Stroke Father   . Colon polyps Father        11 in [place] intes removed carcinoid tumor confined in the tumor  . Stroke Maternal Grandmother   . Kidney failure Maternal Grandfather   . Esophageal cancer Neg Hx   . Rectal cancer Neg Hx   . Stomach cancer Neg Hx     Social History   Socioeconomic History  . Marital status: Widowed    Spouse name: Not on file  . Number of children: Not  on file  . Years of education: Not on file  . Highest education level: Not on file  Occupational History  . Occupation: retired  Scientific laboratory technician  . Financial resource strain: Not on file  . Food insecurity:    Worry: Not on file    Inability: Not on file  . Transportation needs:    Medical: Not on file    Non-medical: Not on file  Tobacco Use  . Smoking status: Never Smoker  . Smokeless tobacco: Never Used  Substance and Sexual Activity  . Alcohol use: No    Alcohol/week: 0.0 standard drinks  . Drug use: No  . Sexual activity: Yes  Lifestyle  . Physical activity:    Days per week: Not on file    Minutes per session: Not on file  . Stress: Not on file  Relationships  . Social connections:    Talks on phone: Not on file    Gets together: Not on file    Attends religious service: Not on file    Active member of club or organization: Not on file    Attends meetings of clubs or organizations: Not on file    Relationship status: Not on file  . Intimate partner violence:    Fear of current or ex partner: Not on file    Emotionally abused: Not on file    Physically abused: Not on file    Forced sexual activity: Not on file  Other Topics Concern  . Not on file  Social History Narrative  . Not on file    Outpatient Medications Prior to Visit  Medication Sig Dispense Refill  . Blood Glucose Monitoring Suppl (PRODIGY AUTOCODE BLOOD GLUCOSE) w/Device KIT 1 Device by Does not apply route 3 (three) times daily between meals as needed. 1 each 0  . furosemide (LASIX) 20 MG tablet Take 1 tablet (20 mg total) by mouth daily. 90 tablet 0  . lisinopril (PRINIVIL,ZESTRIL) 5 MG tablet TAKE 1 TABLET(5 MG) BY MOUTH DAILY 90 tablet 1  . metFORMIN (GLUCOPHAGE) 500 MG tablet 2 tabs po bid and 1 tab po q noon 450 tablet 1  . albuterol (PROVENTIL HFA;VENTOLIN HFA) 108 (90 Base) MCG/ACT inhaler Inhale 2 puffs into the lungs every 6 (six) hours as needed for wheezing or shortness of breath. 1 Inhaler  0  . cetirizine (ZYRTEC) 10 MG tablet Take 1 tablet (10 mg total) by mouth daily as needed for allergies. 30 tablet 4  . doxycycline (VIBRA-TABS) 100 MG tablet Take 1 tablet (100 mg total) by mouth 2 (two) times daily. 20 tablet 0  . fluticasone (FLONASE) 50 MCG/ACT nasal spray  Place 2 sprays into both nostrils daily. 16 g 6  . glucose blood (FREESTYLE LITE) test strip USE TO TEST BLOOD SUGAR TWICE DAILY AS DIRECTED 100 each 6  . HYDROcodone-acetaminophen (NORCO) 5-325 MG tablet Take 1 tablet by mouth every 6 (six) hours as needed. 10 tablet 0  . HYDROcodone-homatropine (HYCODAN) 5-1.5 MG/5ML syrup Take 2.5-5 mLs by mouth at bedtime as needed for cough. 80 mL 0  . Insulin Pen Needle (NOVOFINE) 32G X 6 MM MISC Use with Victoza 100 each 6  . Lancets MISC by Does not apply route. Check blood sugars qam and once daily prn     . methylPREDNISolone (MEDROL) 4 MG tablet 5 tab po qd X 1d then 4 tab po qd X 1d then 3 tab po qd X 1d then 2 tab po qd then 1 tab po qd 15 tablet 0  . predniSONE (DELTASONE) 20 MG tablet Take 1 tablet (20 mg total) by mouth 2 (two) times daily with a meal. 10 tablet 0  . rosuvastatin (CRESTOR) 5 MG tablet TAKE ONE TABLET EVERY OTHER DAY. 30 tablet 3  . Vitamin D, Ergocalciferol, (DRISDOL) 50000 units CAPS capsule Take 1 capsule (50,000 Units total) by mouth as directed. Take one tab every 14 days 4 capsule 0   No facility-administered medications prior to visit.     Allergies  Allergen Reactions  . Contrast Media [Iodinated Diagnostic Agents] Shortness Of Breath  . Doxycycline     Severe oral ulcers.  . Lovastatin     myalgias    Review of Systems  Constitutional: Negative for fever and malaise/fatigue.  HENT: Negative for congestion.   Eyes: Negative for blurred vision.  Respiratory: Negative for shortness of breath.   Cardiovascular: Negative for chest pain, palpitations and leg swelling.  Gastrointestinal: Negative for abdominal pain, blood in stool and nausea.    Genitourinary: Negative for dysuria and frequency.  Musculoskeletal: Positive for joint pain. Negative for falls.  Skin: Negative for rash.  Neurological: Negative for dizziness, loss of consciousness and headaches.  Endo/Heme/Allergies: Negative for environmental allergies.  Psychiatric/Behavioral: Negative for depression. The patient is not nervous/anxious.        Objective:    Physical Exam  Constitutional: She is oriented to person, place, and time. She appears well-developed and well-nourished. No distress.  HENT:  Head: Normocephalic and atraumatic.  Nose: Nose normal.  Eyes: Right eye exhibits no discharge. Left eye exhibits no discharge.  Neck: Normal range of motion. Neck supple.  Cardiovascular: Normal rate and regular rhythm.  No murmur heard. Pulmonary/Chest: Effort normal and breath sounds normal.  Abdominal: Soft. Bowel sounds are normal. There is no tenderness.  Musculoskeletal: She exhibits no edema.  Neurological: She is alert and oriented to person, place, and time.  Skin: Skin is warm and dry.  Psychiatric: She has a normal mood and affect.  Nursing note and vitals reviewed.   BP 112/60 (BP Location: Left Arm, Patient Position: Sitting, Cuff Size: Normal)   Pulse 86   Temp 97.6 F (36.4 C) (Oral)   Resp 18   Wt 185 lb 6.4 oz (84.1 kg)   SpO2 98%   BMI 32.84 kg/m  Wt Readings from Last 3 Encounters:  08/09/18 185 lb 6.4 oz (84.1 kg)  07/14/18 180 lb (81.6 kg)  06/05/18 189 lb 3.2 oz (85.8 kg)   BP Readings from Last 3 Encounters:  08/09/18 112/60  07/15/18 126/78  06/06/18 106/60     Immunization History  Administered Date(s) Administered  .  Influenza Split 10/20/2011, 11/29/2012  . Influenza Whole 08/26/2010  . Influenza, High Dose Seasonal PF 08/23/2016  . Influenza,inj,Quad PF,6+ Mos 09/05/2013, 08/28/2014, 11/12/2015  . Pneumococcal Conjugate-13 08/28/2014  . Pneumococcal Polysaccharide-23 11/12/2015    Health Maintenance  Topic Date  Due  . TETANUS/TDAP  09/07/1956  . FOOT EXAM  08/08/2017  . INFLUENZA VACCINE  07/26/2018  . HEMOGLOBIN A1C  11/01/2018  . OPHTHALMOLOGY EXAM  02/15/2019  . DEXA SCAN  Completed  . PNA vac Low Risk Adult  Completed    Lab Results  Component Value Date   WBC 12.5 (H) 05/01/2018   HGB 13.2 05/01/2018   HCT 39.2 05/01/2018   PLT 248.0 05/01/2018   GLUCOSE 125 (H) 05/01/2018   CHOL 142 05/01/2018   TRIG 121.0 05/01/2018   HDL 42.50 05/01/2018   LDLCALC 75 05/01/2018   ALT 11 05/01/2018   AST 22 05/01/2018   NA 138 05/01/2018   K 4.1 05/01/2018   CL 102 05/01/2018   CREATININE 0.87 05/01/2018   BUN 22 05/01/2018   CO2 28 05/01/2018   TSH 5.19 (H) 05/01/2018   INR 1.0 11/21/2016   HGBA1C 6.5 05/01/2018   MICROALBUR <0.7 11/12/2015    Lab Results  Component Value Date   TSH 5.19 (H) 05/01/2018   Lab Results  Component Value Date   WBC 12.5 (H) 05/01/2018   HGB 13.2 05/01/2018   HCT 39.2 05/01/2018   MCV 94.6 05/01/2018   PLT 248.0 05/01/2018   Lab Results  Component Value Date   NA 138 05/01/2018   K 4.1 05/01/2018   CO2 28 05/01/2018   GLUCOSE 125 (H) 05/01/2018   BUN 22 05/01/2018   CREATININE 0.87 05/01/2018   BILITOT 0.7 05/01/2018   ALKPHOS 100 05/01/2018   AST 22 05/01/2018   ALT 11 05/01/2018   PROT 7.1 05/01/2018   ALBUMIN 3.6 05/01/2018   CALCIUM 9.5 05/01/2018   GFR 66.48 05/01/2018   Lab Results  Component Value Date   CHOL 142 05/01/2018   Lab Results  Component Value Date   HDL 42.50 05/01/2018   Lab Results  Component Value Date   LDLCALC 75 05/01/2018   Lab Results  Component Value Date   TRIG 121.0 05/01/2018   Lab Results  Component Value Date   CHOLHDL 3 05/01/2018   Lab Results  Component Value Date   HGBA1C 6.5 05/01/2018         Assessment & Plan:   Problem List Items Addressed This Visit    ESSENTIAL HYPERTENSION, BENIGN    Well controlled, no changes to meds. Encouraged heart healthy diet such as the DASH  diet and exercise as tolerated.       Relevant Medications   rosuvastatin (CRESTOR) 5 MG tablet   Other Relevant Orders   CBC   Comprehensive metabolic panel   TSH   URINARY INCONTINENCE    UA and Culture and Senstivity      Relevant Orders   Urinalysis   Urine Culture   Hyperlipidemia, mixed    Encouraged heart healthy diet, increase exercise, avoid trans fats, consider a krill oil cap daily      Relevant Medications   rosuvastatin (CRESTOR) 5 MG tablet   Other Relevant Orders   Lipid panel   Vitamin D deficiency    Monitor and supplement      Relevant Orders   VITAMIN D 25 Hydroxy (Vit-D Deficiency, Fractures)   Type 2 diabetes mellitus without complication, without long-term current  use of insulin (HCC)    hgba1c acceptable, minimize simple carbs. Increase exercise as tolerated. Continue current meds. Her sugars got high up to 400 when she was on antibiotics and steroids. Mostly she stayed in 200s . Now that she is better her fasting sugar this morning was 122      Relevant Medications   rosuvastatin (CRESTOR) 5 MG tablet   Other Relevant Orders   Hemoglobin A1c   Abnormal TSH    monitor      Anterior dislocation of right shoulder    Dislocated on 7/20 when she fell in her garden. They tried to reduce it in ER but they were unsuccessful. She had to go back under anesthesia then it reduced easily. She was put in a sling and now she is out of it. She starts PT today but she is still sore so she is apprehensive and the copay is $40 so that is cost prohibitive         I have discontinued Rashea T. Maguire's Vitamin D (Ergocalciferol), Insulin Pen Needle, doxycycline, albuterol, methylPREDNISolone, cetirizine, fluticasone, predniSONE, HYDROcodone-homatropine, and HYDROcodone-acetaminophen. I have also changed her Lancets. Additionally, I am having her maintain her furosemide, lisinopril, Prodigy Autocode Blood Glucose, metFORMIN, glucose blood, and rosuvastatin.  Meds  ordered this encounter  Medications  . glucose blood (FREESTYLE LITE) test strip    Sig: USE TO TEST BLOOD SUGAR TWICE DAILY AS DIRECTED    Dispense:  100 each    Refill:  6  . rosuvastatin (CRESTOR) 5 MG tablet    Sig: TAKE ONE TABLET EVERY OTHER DAY.    Dispense:  30 tablet    Refill:  3  . Lancets MISC    Sig: Check blood sugars qam and once daily prn    Dispense:  200 each    Refill:  3   CMA served as scribe during this visit. History, Physical and Plan performed by medical provider. Documentation and orders reviewed and attested to.   Penni Homans, MD

## 2018-08-09 NOTE — Assessment & Plan Note (Signed)
Dislocated on 7/20 when she fell in her garden. They tried to reduce it in ER but they were unsuccessful. She had to go back under anesthesia then it reduced easily. She was put in a sling and now she is out of it. She starts PT today but she is still sore so she is apprehensive and the copay is $40 so that is cost prohibitive

## 2018-08-09 NOTE — Patient Instructions (Signed)
Shoulder Dislocation Your shoulder joint is made up of 3 bones:  The upper arm bone (humerus).  The shoulder blade (scapula).  The collarbone (clavicle).  A shoulder dislocation happens when your upper arm bone moves out of its normal place in your shoulder joint. Follow these instructions at home: If you have a splint or sling:  Wear it as told by your doctor.  Take it off only as told by your doctor.  Loosen it if: ? Your fingers become numb and tingly. ? Your fingers turn cold and blue.  Keep it clean and dry. Bathing  Do not take baths, swim, or use a hot tub until your doctor says you can. Ask your doctor if you can take showers. You may only be allowed to take sponge baths.  If your doctor says taking baths or showers is okay, cover your splint or sling with a plastic bag. Do not let the splint or sling get wet. Managing pain, stiffness, and swelling  If told, put ice on the injured area. ? Put ice in a plastic bag. ? Place a towel between your skin and the bag. ? Leave the ice on for 20 minutes, 2-3 times per day.  Move your fingers often to avoid stiffness and to lessen swelling.  Raise (elevate) the injured area above the level of your heart while you are sitting or lying down. Driving  Do not drive while you are wearing a splint or sling on a hand that you use for driving.  Do not drive or operate heavy machinery while taking pain medicine. Activity  Return to your normal activities as told by your doctor. Ask your doctor what activities are safe for you.  Do range-of-motion exercises only as told by your doctor.  Exercise your hand by squeezing a soft ball. This keeps your hand and wrist from getting stiff and swollen. General instructions  Take over-the-counter and prescription medicines only as told by your doctor.  Do not use any tobacco products, including cigarettes, chewing tobacco, or e-cigarettes. Tobacco can slow down healing. If you need help  quitting, ask your doctor.  Keep all follow-up visits as told by your doctor. This is important. Contact a doctor if:  Your splint or sling gets damaged. Get help right away if:  Your pain gets worse instead of better.  You lose feeling in your arm or hand.  Your arm or hand turns white and cold. This information is not intended to replace advice given to you by your health care provider. Make sure you discuss any questions you have with your health care provider. Document Released: 03/05/2012 Document Revised: 08/07/2016 Document Reviewed: 04/06/2015 Elsevier Interactive Patient Education  [employer].

## 2018-08-09 NOTE — Assessment & Plan Note (Signed)
hgba1c acceptable, minimize simple carbs. Increase exercise as tolerated. Continue current meds. Her sugars got high up to 400 when she was on antibiotics and steroids. Mostly she stayed in 200s . Now that she is better her fasting sugar this morning was 122

## 2018-08-10 LAB — URINE CULTURE
MICRO NUMBER: 90971051
SPECIMEN QUALITY: ADEQUATE

## 2018-08-13 DIAGNOSIS — Z1231 Encounter for screening mammogram for malignant neoplasm of breast: Secondary | ICD-10-CM | POA: Diagnosis not present

## 2018-08-13 LAB — HM MAMMOGRAPHY

## 2018-08-14 ENCOUNTER — Encounter: Payer: Self-pay | Admitting: Family Medicine

## 2018-08-15 ENCOUNTER — Other Ambulatory Visit: Payer: Self-pay | Admitting: Family Medicine

## 2018-08-15 DIAGNOSIS — S43004A Unspecified dislocation of right shoulder joint, initial encounter: Secondary | ICD-10-CM | POA: Diagnosis not present

## 2018-08-21 DIAGNOSIS — S43004A Unspecified dislocation of right shoulder joint, initial encounter: Secondary | ICD-10-CM | POA: Diagnosis not present

## 2018-08-28 DIAGNOSIS — S43004A Unspecified dislocation of right shoulder joint, initial encounter: Secondary | ICD-10-CM | POA: Diagnosis not present

## 2018-09-04 DIAGNOSIS — S43004A Unspecified dislocation of right shoulder joint, initial encounter: Secondary | ICD-10-CM | POA: Diagnosis not present

## 2018-09-11 ENCOUNTER — Telehealth: Payer: Self-pay

## 2018-09-11 NOTE — Telephone Encounter (Signed)
Copied from [place] [id number]. Topic: General - Other >> Sep 11, 2018 10:12 AM Carolyn Stare wrote: . She said she never heard back about her labs   Pt said she would like a copy of labs mail to her home address      Sent labs to patient address

## 2018-09-13 DIAGNOSIS — S43004A Unspecified dislocation of right shoulder joint, initial encounter: Secondary | ICD-10-CM | POA: Diagnosis not present

## 2018-09-18 ENCOUNTER — Encounter (INDEPENDENT_AMBULATORY_CARE_PROVIDER_SITE_OTHER): Payer: Self-pay | Admitting: Orthopaedic Surgery

## 2018-09-18 ENCOUNTER — Ambulatory Visit (INDEPENDENT_AMBULATORY_CARE_PROVIDER_SITE_OTHER): Payer: Medicare HMO | Admitting: Orthopaedic Surgery

## 2018-09-18 DIAGNOSIS — S43014D Anterior dislocation of right humerus, subsequent encounter: Secondary | ICD-10-CM

## 2018-09-18 NOTE — Progress Notes (Signed)
Post-Op Visit Note   Patient: Stacey Cooper           Date of Birth: [birth year]           MRN: [id number] Visit Date: 09/18/2018 PCP: Mosie Lukes, MD   Assessment & Plan:  Chief Complaint:  Chief Complaint  Patient presents with  . Right Shoulder - Follow-up   Visit Diagnoses:  1. Anterior dislocation of right shoulder, subsequent encounter     Plan: Kreher is doing very well status post right shoulder dislocation.  She is 2 months out from reduction.  She has completed physical therapy and discharged to home exercise.  Her exam shows a well compensated shoulder.  Isolated supraspinatus testing does show mild weakness.  She is got excellent range of motion at this point.  From my standpoint she is doing really well and I recommend continue with home exercises.  Precautions reviewed today with the patient.  Follow-up as needed.  Follow-Up Instructions: Return if symptoms worsen or fail to improve.   Orders:  No orders of the defined types were placed in this encounter.  No orders of the defined types were placed in this encounter.   Imaging: No results found.  PMFS History: Patient Active Problem List   Diagnosis Date Noted  . Anterior dislocation of right shoulder   . Acute non-recurrent maxillary sinusitis 06/06/2018  . Allergic rhinitis 05/08/2018  . Abnormal TSH 03/12/2018  . Subclinical hypothyroidism 01/30/2018  . Type 2 diabetes mellitus without complication, without long-term current use of insulin ([place]) 09/26/2017  . Vitamin D deficiency 08/10/2017  . Vulvar atrophy 03/17/2017  . Preventative health care 08/23/2016  . Umbilical hernia 40/98/1191  . Left wrist pain 11/20/2015  . Postmenopausal estrogen deficiency 11/20/2015  . Arthralgia 05/03/2015  . Hepatic cirrhosis ([place]) 04/27/2015  . Hypokalemia 10/19/2014  . Dermatitis 06/28/2014  . Grief reaction 01/20/2014  . Medicare annual wellness visit, subsequent 06/28/2013  . Hearing loss of both ears  06/28/2013  . Leg cramps 06/06/2013  . Bronchitis 12/20/2012  . Insomnia 07/24/2012  . Hyperlipidemia, mixed 04/24/2012  . Back pain 12/08/2011  . SCC (squamous cell carcinoma), face 07/18/2011  . Actinic keratoses 07/18/2011  . Peripheral neuropathy 04/20/2011  . URINARY INCONTINENCE 02/07/2011  . NONSPEC ELEVATION OF LEVELS OF TRANSAMINASE/LDH 02/07/2011  . CHOLELITHIASIS, HX OF 02/07/2011  . BASAL CELL CARCINOMA OF SKIN OF LIP 01/24/2011  . Morbid obesity ([place]) 01/24/2011  . ESSENTIAL HYPERTENSION, BENIGN 01/24/2011  . GERD 01/24/2011  . Alopecia 01/24/2011  . MEASLES, HX OF 01/24/2011  . UTI'S, HX OF 01/24/2011  . History of other specified conditions presenting hazards to health 01/24/2011   Past Medical History:  Diagnosis Date  . ABDOMINAL PAIN, RIGHT UPPER QUADRANT, HX OF 01/24/2011  . Actinic keratoses 07/18/2011  . Arthralgia 05/03/2015  . Back pain 12/08/2011  . Basal cell carcinoma of skin of lip 01/24/2011  . Blood transfusion without reported diagnosis   . Bronchitis 12/20/2012  . Cataract    bilataeral cateracts removed  . CHOLELITHIASIS, HX OF 02/07/2011  . Chronic kidney disease   . Diabetes mellitus   . Diabetes mellitus type 2 in obese [facility]) 01/24/2011   Qualifier: Diagnosis of  By: [name], [name]  [facility] exam with [facility] on 06/14/13   . DM 01/24/2011  . Essential hypertension, benign 01/24/2011  . Fatty liver   . GERD 01/24/2011  . Grief reaction 01/20/2014  . HAIR LOSS 01/24/2011  . Hearing loss  of both ears 06/28/2013  . Hepatic cirrhosis ([place]) 04/27/2015  . Hernia, abdominal   . Hyperlipidemia 04/24/2012  . Hyperlipidemia, mixed 04/24/2012  . Insomnia 07/24/2012  . Joint pain   . Leg cramps 06/06/2013  . MEASLES, HX OF 01/24/2011  . Morbid obesity ([place]) 01/24/2011  . NONSPEC ELEVATION OF LEVELS OF TRANSAMINASE/LDH 02/07/2011  . Peripheral neuropathy 04/20/2011  . Preventative health care 08/23/2016  . Right hip pain 05/03/2015  . SCC (squamous  cell carcinoma), face 07/18/2011  . SQUAMOUS CELL CARCINOMA OTHER SPEC SITES SKIN 01/24/2011  . Swelling   . Tubular adenoma of colon 04/2015  . Umbilical hernia 4/65/0354  . URINARY INCONTINENCE 02/07/2011  . Urinary tract infection, site not specified 04/30/2014  . UTI'S, HX OF 01/24/2011  . Vulvar atrophy 03/17/2017    Family History  Problem Relation Age of Onset  . Arthritis Mother        RA  . Myelodysplastic syndrome Mother   . Heart disease Mother   . Cancer Father        skin  . Stroke Father   . Colon polyps Father        46 in [place] intes removed carcinoid tumor confined in the tumor  . Stroke Maternal Grandmother   . Kidney failure Maternal Grandfather   . Esophageal cancer Neg Hx   . Rectal cancer Neg Hx   . Stomach cancer Neg Hx     Past Surgical History:  Procedure Laterality Date  . ABDOMINAL HYSTERECTOMY     partial, both ovaries left in place  . BCC removed below lower lip on left  26 yrs ago  . cataract extraction, b/l    . COLONOSCOPY    . Left knee arthroscopy and cleaning prior to TKR    . SHOULDER CLOSED REDUCTION Right 07/14/2018   Procedure: CLOSED REDUCTION SHOULDER, RIGHT;  Surgeon: Leandrew Koyanagi, MD;  Location: [facility];  Service: Orthopedics;  Laterality: Right;  . skin bx, right anterior CW , squamous cancer, had to redo to margins  12/2010  . total knee replacement, b/l     Social History   Occupational History  . Occupation: retired  Tobacco Use  . Smoking status: Never Smoker  . Smokeless tobacco: Never Used  Substance and Sexual Activity  . Alcohol use: No    Alcohol/week: 0.0 standard drinks  . Drug use: No  . Sexual activity: Yes

## 2018-09-20 ENCOUNTER — Encounter: Payer: Self-pay | Admitting: Gastroenterology

## 2018-09-20 ENCOUNTER — Other Ambulatory Visit: Payer: Medicare HMO

## 2018-09-20 ENCOUNTER — Ambulatory Visit: Payer: Medicare HMO | Admitting: Gastroenterology

## 2018-09-20 ENCOUNTER — Other Ambulatory Visit (INDEPENDENT_AMBULATORY_CARE_PROVIDER_SITE_OTHER): Payer: Medicare HMO

## 2018-09-20 VITALS — BP 112/68 | HR 62 | Ht 63.0 in | Wt 189.0 lb

## 2018-09-20 DIAGNOSIS — K7460 Unspecified cirrhosis of liver: Secondary | ICD-10-CM

## 2018-09-20 LAB — PROTIME-INR
INR: 1.1 ratio — AB (ref 0.8–1.0)
PROTHROMBIN TIME: 12.4 s (ref 9.6–13.1)

## 2018-09-20 NOTE — Progress Notes (Signed)
    History of Present Illness: This is an 83 year old female with cirrhosis, presumed NASH.  She relates an occasional bulge at her umbilicus that she feels is a hernia.  It reduces spontaneously without pain.  She has no other gastrointestinal complaints.  Recent AFP, LFTs and CBC unremarkable.  RUQ [name] 06/2018 no discrete hepatic lesions, cholelithiasis EGD 04/2015 was normal.   Current Medications, Allergies, Past Medical History, Past Surgical History, Family History and Social History were reviewed in [employer] record.  Physical Exam: General: Well developed, well nourished, no acute distress Head: Normocephalic and atraumatic Eyes:  sclerae anicteric, EOMI Ears: Normal auditory acuity Mouth: No deformity or lesions Lungs: Clear throughout to auscultation Heart: Regular rate and rhythm; no murmurs, rubs or bruits Abdomen: Soft, non tender and non distended. No masses, hepatosplenomegaly noted.  Small umbilical hernia.  Normal Bowel sounds Rectal: Not done Musculoskeletal: Symmetrical with no gross deformities  Pulses:  Normal pulses noted Extremities: No clubbing, cyanosis, edema or deformities noted Neurological: Alert oriented x 4, grossly nonfocal Psychological:  Alert and cooperative. Normal mood and affect   Assessment and Recommendations:  1.  Cirrhosis likely secondary to NASH. PT/INR today. EGD to screen for varices. The risks (including bleeding, perforation, infection, missed lesions, medication reactions and possible hospitalization or surgery if complications occur), benefits, and alternatives to endoscopy with possible biopsy and possible dilation were discussed with the patient and they consent to proceed.  Repeat RUQ [name], AFP, CMET, CBC, PT/INR at 6 months intervals. REV in 1 year.   2. Small umblical hernia. Observe for now.  If she develops worsening symptoms we will proceed with surgical referral for further evaluation   3. Complex left  renal cyst.  She was evaluated by urology in January 2019 and a six-month follow-up was recommended. Pt advised to contact [facility] to schedule a follow up appt.

## 2018-09-20 NOTE — Patient Instructions (Signed)
Your provider has requested that you go to the basement level for lab work before leaving today. Press "B" on the elevator. The lab is located at the first door on the left as you exit the elevator.  You have been scheduled for an endoscopy. Please follow written instructions given to you at your visit today. If you use inhalers (even only as needed), please bring them with you on the day of your procedure. Your physician has requested that you go to www.startemmi.com and enter the access code given to you at your visit today. This web site gives a general overview about your procedure. However, you should still follow specific instructions given to you by our office regarding your preparation for the procedure.  Make a follow up with your Urologist for your cyst.   Thank you for choosing me and [facility].  Pricilla Riffle. Dagoberto Ligas., MD., Marval Regal

## 2018-09-24 ENCOUNTER — Ambulatory Visit: Payer: Medicare HMO

## 2018-09-24 ENCOUNTER — Ambulatory Visit (AMBULATORY_SURGERY_CENTER): Payer: Medicare HMO | Admitting: Gastroenterology

## 2018-09-24 ENCOUNTER — Encounter: Payer: Self-pay | Admitting: Gastroenterology

## 2018-09-24 VITALS — BP 132/57 | HR 85 | Temp 97.7°F | Resp 26 | Ht 63.0 in | Wt 189.0 lb

## 2018-09-24 DIAGNOSIS — I10 Essential (primary) hypertension: Secondary | ICD-10-CM | POA: Diagnosis not present

## 2018-09-24 DIAGNOSIS — K7460 Unspecified cirrhosis of liver: Secondary | ICD-10-CM

## 2018-09-24 DIAGNOSIS — E119 Type 2 diabetes mellitus without complications: Secondary | ICD-10-CM | POA: Diagnosis not present

## 2018-09-24 MED ORDER — SODIUM CHLORIDE 0.9 % IV SOLN: 500.0000 mL | Freq: Once | INTRAVENOUS | Status: DC

## 2018-09-24 NOTE — Progress Notes (Signed)
Report to PACU, RN, vss, BBS= Clear.  

## 2018-09-24 NOTE — Progress Notes (Signed)
Pt's states no medical or surgical changes since previsit or office visit. 

## 2018-09-24 NOTE — Op Note (Signed)
[facility] Patient Name: Stacey Cooper Procedure Date: 09/24/2018 3:38 PM MRN: [id number] Endoscopist: Ladene Artist , MD Age: 83 Referring MD:  Date of Birth: [birth year] Gender: Female Account #: [id number] Procedure:                Upper GI endoscopy Indications:              Screening procedure. Cirrhosis, screening for                            varices. Medicines:                Monitored Anesthesia Care Procedure:                Pre-Anesthesia Assessment:                           - Prior to the procedure, a History and Physical                            was performed, and patient medications and                            allergies were reviewed. The patient's tolerance of                            previous anesthesia was also reviewed. The risks                            and benefits of the procedure and the sedation                            options and risks were discussed with the patient.                            All questions were answered, and informed consent                            was obtained. Prior Anticoagulants: The patient has                            taken no previous anticoagulant or antiplatelet                            agents. ASA Grade Assessment: III - A patient with                            severe systemic disease. After reviewing the risks                            and benefits, the patient was deemed in                            satisfactory condition to undergo the procedure.  After obtaining informed consent, the endoscope was                            passed under direct vision. Throughout the                            procedure, the patient's blood pressure, pulse, and                            oxygen saturations were monitored continuously. The                            Endoscope was introduced through the mouth, and                            advanced to the second part of duodenum. The  upper                            GI endoscopy was accomplished without difficulty.                            The patient tolerated the procedure well. Scope In: Scope Out: Findings:                 The esophagus was normal.                           The stomach was normal.                           The examined duodenum was normal.                           The cardia and gastric fundus were normal on                            retroflexion. Complications:            No immediate complications. Estimated Blood Loss:     Estimated blood loss: none. Impression:               - Normal esophagus.                           - Normal stomach.                           - Normal examined duodenum.                           - No specimens collected. Recommendation:           - Patient has a contact number available for                            emergencies. The signs and symptoms of potential  delayed complications were discussed with the                            patient. Return to normal activities tomorrow.                            Written discharge instructions were provided to the                            patient.                           - Resume previous diet.                           - Continue present medications.                           - Consider repeat upper endoscopy in 3 years for                            screening purposes. Ladene Artist, MD 09/24/2018 3:51:43 PM This report has been signed electronically.

## 2018-09-24 NOTE — Patient Instructions (Signed)
YOU HAD AN ENDOSCOPIC PROCEDURE TODAY AT THE [facility]:   Refer to the procedure report that was given to you for any specific questions about what was found during the examination.  If the procedure report does not answer your questions, please call your gastroenterologist to clarify.  If you requested that your care partner not be given the details of your procedure findings, then the procedure report has been included in a sealed envelope for you to review at your convenience later.  YOU SHOULD EXPECT: Some feelings of bloating in the abdomen. Passage of more gas than usual.  Walking can help get rid of the air that was put into your GI tract during the procedure and reduce the bloating. If you had a lower endoscopy (such as a colonoscopy or flexible sigmoidoscopy) you may notice spotting of blood in your stool or on the toilet paper. If you underwent a bowel prep for your procedure, you may not have a normal bowel movement for a few days.  Please Note:  You might notice some irritation and congestion in your nose or some drainage.  This is from the oxygen used during your procedure.  There is no need for concern and it should clear up in a day or so.  SYMPTOMS TO REPORT IMMEDIATELY:   Following upper endoscopy (EGD)  Vomiting of blood or coffee ground material  New chest pain or pain under the shoulder blades  Painful or persistently difficult swallowing  New shortness of breath  Fever of 100F or higher  Black, tarry-looking stools  For urgent or emergent issues, a gastroenterologist can be reached at any hour by calling [phone].   DIET:  We do recommend a small meal at first, but then you may proceed to your regular diet.  Drink plenty of fluids but you should avoid alcoholic beverages for 24 hours.  ACTIVITY:  You should plan to take it easy for the rest of today and you should NOT DRIVE or use heavy machinery until tomorrow (because of the sedation medicines used  during the test).    FOLLOW UP: Our staff will call the number listed on your records the next business day following your procedure to check on you and address any questions or concerns that you may have regarding the information given to you following your procedure. If we do not reach you, we will leave a message.  However, if you are feeling well and you are not experiencing any problems, there is no need to return our call.  We will assume that you have returned to your regular daily activities without incident.  If any biopsies were taken you will be contacted by phone or by letter within the next 1-3 weeks.  Please call us at [phone] if you have not heard about the biopsies in 3 weeks.    SIGNATURES/CONFIDENTIALITY: You and/or your care partner have signed paperwork which will be entered into your electronic medical record.  These signatures attest to the fact that that the information above on your After Visit Summary has been reviewed and is understood.  Full responsibility of the confidentiality of this discharge information lies with you and/or your care-partner. 

## 2018-09-25 ENCOUNTER — Telehealth: Payer: Self-pay

## 2018-09-25 NOTE — Telephone Encounter (Signed)
  Follow up Call-  Call back number 09/24/2018  Post procedure Call Back phone  # [phone]  Permission to leave phone message Yes  Some recent data might be hidden     Patient questions:  Do you have a fever, pain , or abdominal swelling? No. Pain Score  0 *  Have you tolerated food without any problems? Yes.    Have you been able to return to your normal activities? Yes.    Do you have any questions about your discharge instructions: Diet   No. Medications  No. Follow up visit  No.  Do you have questions or concerns about your Care? No.  Actions: * If pain score is 4 or above: No action needed, pain <4.

## 2018-10-06 DIAGNOSIS — R69 Illness, unspecified: Secondary | ICD-10-CM | POA: Diagnosis not present

## 2018-10-09 ENCOUNTER — Ambulatory Visit (INDEPENDENT_AMBULATORY_CARE_PROVIDER_SITE_OTHER): Payer: Medicare HMO

## 2018-10-09 DIAGNOSIS — Z23 Encounter for immunization: Secondary | ICD-10-CM

## 2018-10-10 ENCOUNTER — Ambulatory Visit: Payer: Medicare HMO | Admitting: Obstetrics & Gynecology

## 2018-11-12 ENCOUNTER — Telehealth: Payer: Self-pay

## 2018-11-12 NOTE — Telephone Encounter (Signed)
Copied from [place] [id number]. Topic: Appointment Scheduling - Transfer of Care >> Nov 12, 2018  4:37 PM Yvette Rack wrote: Pt is requesting to transfer FROM: Dr. Charlett Blake Pt is requesting to transfer TO: Dr. Nani Ravens Reason for requested transfer: Pt states she just prefers to see Dr. Nani Ravens  Pt requests a call back to advise if she has been approved to transfer to Dr. Nani Ravens.   Send CRM to patient's current PCP (transferring FROM).

## 2018-11-13 NOTE — Telephone Encounter (Signed)
That's fine to switch. I've never seen her before so please re-verify she wants to change prior to adjusting her chart. TY.

## 2018-11-13 NOTE — Telephone Encounter (Signed)
OK with me.

## 2018-11-14 NOTE — Telephone Encounter (Signed)
Pt returned call, confirmed w/ pt that she definitely wants to see Dr. Nani Ravens. Pt is scheduled to come in for [facility] visit with new PCP on 12/06/18. Pt was also due a 4 month follow up with previous provider.

## 2018-11-14 NOTE — Telephone Encounter (Signed)
Called left message to call back 

## 2018-11-16 ENCOUNTER — Ambulatory Visit: Payer: Medicare HMO | Admitting: Family Medicine

## 2018-11-19 DIAGNOSIS — H903 Sensorineural hearing loss, bilateral: Secondary | ICD-10-CM | POA: Diagnosis not present

## 2018-11-26 DIAGNOSIS — R69 Illness, unspecified: Secondary | ICD-10-CM | POA: Diagnosis not present

## 2018-12-06 ENCOUNTER — Encounter: Payer: Self-pay | Admitting: Family Medicine

## 2018-12-06 ENCOUNTER — Ambulatory Visit (INDEPENDENT_AMBULATORY_CARE_PROVIDER_SITE_OTHER): Payer: Medicare HMO | Admitting: Family Medicine

## 2018-12-06 VITALS — BP 110/72 | HR 80 | Temp 97.6°F | Ht 63.0 in | Wt 192.5 lb

## 2018-12-06 DIAGNOSIS — E119 Type 2 diabetes mellitus without complications: Secondary | ICD-10-CM

## 2018-12-06 DIAGNOSIS — Z23 Encounter for immunization: Secondary | ICD-10-CM | POA: Diagnosis not present

## 2018-12-06 DIAGNOSIS — R2681 Unsteadiness on feet: Secondary | ICD-10-CM | POA: Diagnosis not present

## 2018-12-06 LAB — COMPREHENSIVE METABOLIC PANEL
ALT: 7 U/L (ref 0–35)
AST: 17 U/L (ref 0–37)
Albumin: 3.9 g/dL (ref 3.5–5.2)
Alkaline Phosphatase: 109 U/L (ref 39–117)
BILIRUBIN TOTAL: 0.7 mg/dL (ref 0.2–1.2)
BUN: 21 mg/dL (ref 6–23)
CO2: 28 mEq/L (ref 19–32)
Calcium: 9.8 mg/dL (ref 8.4–10.5)
Chloride: 106 mEq/L (ref 96–112)
Creatinine, Ser: 0.87 mg/dL (ref 0.40–1.20)
GFR: 66.38 mL/min (ref >60.00)
Glucose, Bld: 150 mg/dL — ABNORMAL HIGH (ref 70–99)
POTASSIUM: 5 meq/L (ref 3.5–5.1)
Sodium: 142 mEq/L (ref 135–145)
Total Protein: 6.8 g/dL (ref 6.0–8.3)

## 2018-12-06 LAB — MICROALBUMIN / CREATININE URINE RATIO
CREATININE, U: 101.6 mg/dL
Microalb Creat Ratio: 1 mg/g (ref 0.0–30.0)
Microalb, Ur: 1 mg/dL (ref 0.0–1.9)

## 2018-12-06 LAB — HEMOGLOBIN A1C: HEMOGLOBIN A1C: 6.8 % — AB (ref 4.6–6.5)

## 2018-12-06 MED ORDER — DICLOFENAC SODIUM 1 % TD GEL: 2.0000 g | Freq: Four times a day (QID) | TRANSDERMAL | 1 refills | Status: DC

## 2018-12-06 NOTE — Progress Notes (Signed)
Pre visit review using our clinic review tool, if applicable. No additional management support is needed unless otherwise documented below in the visit note. 

## 2018-12-06 NOTE — Addendum Note (Signed)
Addended by: Sharon Seller B on: 12/06/2018 04:21 PM   Modules accepted: Orders

## 2018-12-06 NOTE — Progress Notes (Signed)
Subjective:   Chief Complaint  Patient presents with  . Follow-up    Stacey Cooper is a 83 y.o. female here for follow-up of diabetes.   Stacey Cooper's self monitored glucose range is around 140's in AM Patient denies hypoglycemic reactions. Patient does not require insulin.   Medications include: Metformin 1000 mg bid and 500 mg at noon.  Exercise: None  Has been feeling off balance, usually when she walks. Started several months ago. Denies any dizziness, lightheadedness, hypoglycemia (has checked sugars and it is normal). Happens 2-3 times/week. Lasts for around several seconds. Gets better on its own. This is not why she fell in the store over the summer. She tripped then.   Past Medical History:  Diagnosis Date  . Actinic keratoses 07/18/2011  . Arthralgia 05/03/2015  . Back pain 12/08/2011  . Basal cell carcinoma of skin of lip 01/24/2011  . Blood transfusion without reported diagnosis   . Cataract    bilataeral cateracts removed  . CHOLELITHIASIS, HX OF 02/07/2011  . Chronic kidney disease   . Diabetes mellitus type 2 in obese [facility]) 01/24/2011   Qualifier: Diagnosis of  By: [name], [name]  Annual eye exam with [facility] on 06/14/13   . Essential hypertension, benign 01/24/2011  . Fatty liver   . GERD 01/24/2011  . Grief reaction 01/20/2014  . HAIR LOSS 01/24/2011  . Hearing loss of both ears 06/28/2013  . Hepatic cirrhosis ([place]) 04/27/2015  . Hyperlipidemia, mixed 04/24/2012  . Insomnia 07/24/2012  . Joint pain   . MEASLES, HX OF 01/24/2011  . Morbid obesity ([place]) 01/24/2011  . NONSPEC ELEVATION OF LEVELS OF TRANSAMINASE/LDH 02/07/2011  . Peripheral neuropathy 04/20/2011  . Right hip pain 05/03/2015  . SQUAMOUS CELL CARCINOMA OTHER SPEC SITES SKIN 01/24/2011  . Swelling   . Tubular adenoma of colon 04/2015  . Umbilical hernia 08/05/9146  . URINARY INCONTINENCE 02/07/2011  . UTI'S, HX OF 01/24/2011  . Vulvar atrophy 03/17/2017     Related testing: Date of retinal exam:  01/2018 Pneumovax: done Flu Shot: done  Review of Systems: Pulmonary:  No SOB Cardiovascular:  No chest pain  Objective:  BP 110/72 (BP Location: Left Arm, Patient Position: Sitting, Cuff Size: Normal)   Pulse 80   Temp 97.6 F (36.4 C) (Oral)   Ht 5\' 3"  (1.6 m)   Wt 192 lb 8 oz (87.3 kg)   SpO2 94%   BMI 34.10 kg/m  General:  Well developed, well nourished, in no apparent distress Skin:  Warm, no pallor or diaphoresis Head:  Normocephalic, atraumatic Eyes:  Pupils equal and round, sclera anicteric without injection  Lungs:  CTAB, no access msc use Cardio:  RRR, no bruits Musculoskeletal:  Symmetrical muscle groups noted without atrophy or deformity, 4/5 strength b/l with hip flexion, 5/5 strength throughout LE's otherwise Neuro:  Sensation intact to pinprick on feet, DTR's equal and symmetric, no clonus, no cerebellar signs Psych: Age appropriate judgment and insight  Assessment:   Type 2 diabetes mellitus without complication, without long-term current use of insulin (HCC) - Plan: Comprehensive metabolic panel, Hemoglobin A1c, Microalbumin / creatinine urine ratio, HM DIABETES FOOT EXAM  Unsteadiness  Need for tetanus booster - Plan: Tdap vaccine greater than or equal to 7yo IM   Plan:   Orders as above. Counseled on diet and exercise. F/u in 3-6 mo pending A1c. For unsteadiness, offered PT, but she cannot afford at this time. Will try topical cream for knee pain as this contributes to  unsteadiness. Home exercises also provided. The patient voiced understanding and agreement to the plan.  [place], DO 12/06/18 8:07 AM

## 2018-12-06 NOTE — Patient Instructions (Addendum)
Let me know if your medicine is too expensive.  Give [name] 2-3 business days to get the results of your labs back.   Let us know if you need anything.  Knee Exercises It is normal to feel mild stretching, pulling, tightness, or discomfort as you do these exercises, but you should stop right away if you feel sudden pain or your pain gets worse. STRETCHING AND RANGE OF MOTION EXERCISES  These exercises warm up your muscles and joints and improve the movement and flexibility of your knee. These exercises also help to relieve pain, numbness, and tingling. Exercise A: Knee Extension, Prone  1. Lie on your abdomen on a bed. 2. Place your left / right knee just beyond the edge of the surface so your knee is not on the bed. You can put a towel under your left / right thigh just above your knee for comfort. 3. Relax your leg muscles and allow gravity to straighten your knee. You should feel a stretch behind your left / right knee. 4. Hold this position for 30 seconds. 5. Scoot up so your knee is supported between repetitions. Repeat 2 times. Complete this stretch 3 times per week. Exercise B: Knee Flexion, Active    1. Lie on your back with both knees straight. If this causes back discomfort, bend your left / right knee so your foot is flat on the floor. 2. Slowly slide your left / right heel back toward your buttocks until you feel a gentle stretch in the front of your knee or thigh. 3. Hold this position for 30 seconds. 4. Slowly slide your left / right heel back to the starting position. Repeat 2 times. Complete this exercise 3 times per week. Exercise C: Quadriceps, Prone    1. Lie on your abdomen on a firm surface, such as a bed or padded floor. 2. Bend your left / right knee and hold your ankle. If you cannot reach your ankle or pant leg, loop a belt around your foot and grab the belt instead. 3. Gently pull your heel toward your buttocks. Your knee should not slide out to the side. You  should feel a stretch in the front of your thigh and knee. 4. Hold this position for 30 seconds. Repeat 2 times. Complete this stretch 3 times per week. Exercise D: Hamstring, Supine  1. Lie on your back. 2. Loop a belt or towel over the ball of your left / right foot. The ball of your foot is on the walking surface, right under your toes. 3. Straighten your left / right knee and slowly pull on the belt to raise your leg until you feel a gentle stretch behind your knee. ? Do not let your left / right knee bend while you do this. ? Keep your other leg flat on the floor. 4. Hold this position for 30 seconds. Repeat 2 times. Complete this stretch 3 times per week. STRENGTHENING EXERCISES  These exercises build strength and endurance in your knee. Endurance is the ability to use your muscles for a long time, even after they get tired. Exercise E: Quadriceps, Isometric    1. Lie on your back with your left / right leg extended and your other knee bent. Put a rolled towel or small pillow under your knee if told by your health care provider. 2. Slowly tense the muscles in the front of your left / right thigh. You should see your kneecap slide up toward your hip or see increased  dimpling just above the knee. This motion will push the back of the knee toward the floor. 3. For 3 seconds, keep the muscle as tight as you can without increasing your pain. 4. Relax the muscles slowly and completely. Repeat for 10 total reps Repeat 2 ti mes. Complete this exercise 3 times per week. Exercise F: Straight Leg Raises - Quadriceps  1. Lie on your back with your left / right leg extended and your other knee bent. 2. Tense the muscles in the front of your left / right thigh. You should see your kneecap slide up or see increased dimpling just above the knee. Your thigh may even shake a bit. 3. Keep these muscles tight as you raise your leg 4-6 inches (10-15 cm) off the floor. Do not let your knee bend. 4. Hold  this position for 3 seconds. 5. Keep these muscles tense as you lower your leg. 6. Relax your muscles slowly and completely after each repetition. 10 total reps. Repeat 2 times. Complete this exercise 3 times per week.  Exercise G: Hamstring Curls    If told by your health care provider, do this exercise while wearing ankle weights. Begin with 5 lb weights (optional). Then increase the weight by 1 lb (0.5 kg) increments. Do not wear ankle weights that are more than 20 lbs to start with. 1. Lie on your abdomen with your legs straight. 2. Bend your left / right knee as far as you can without feeling pain. Keep your hips flat against the floor. 3. Hold this position for 3 seconds. 4. Slowly lower your leg to the starting position. Repeat for 10 reps.  Repeat 2 times. Complete this exercise 3 times per week. Exercise H: Squats (Quadriceps)  1. Stand in front of a table, with your feet and knees pointing straight ahead. You may rest your hands on the table for balance but not for support. 2. Slowly bend your knees and lower your hips like you are going to sit in a chair. ? Keep your weight over your heels, not over your toes. ? Keep your lower legs upright so they are parallel with the table legs. ? Do not let your hips go lower than your knees. ? Do not bend lower than told by your health care provider. ? If your knee pain increases, do not bend as low. 3. Hold the squat position for 1 second. 4. Slowly push with your legs to return to standing. Do not use your hands to pull yourself to standing. Repeat 2 times. Complete this exercise 3 times per week. Exercise I: Wall Slides (Quadriceps)    1. Lean your back against a smooth wall or door while you walk your feet out 18-24 inches (46-61 cm) from it. 2. Place your feet hip-width apart. 3. Slowly slide down the wall or door until your knees Repeat 2 times. Complete this exercise every other day. 4. Exercise K: Straight Leg Raises - Hip  Abductors  1. Lie on your side with your left / right leg in the top position. Lie so your head, shoulder, knee, and hip line up. You may bend your bottom knee to help you keep your balance. 2. Roll your hips slightly forward so your hips are stacked directly over each other and your left / right knee is facing forward. 3. Leading with your heel, lift your top leg 4-6 inches (10-15 cm). You should feel the muscles in your outer hip lifting. ? Do not let your foot  drift forward. ? Do not let your knee roll toward the ceiling. 4. Hold this position for 3 seconds. 5. Slowly return your leg to the starting position. 6. Let your muscles relax completely after each repetition. 10 total reps. Repeat 2 times. Complete this exercise 3 times per week. Exercise J: Straight Leg Raises - Hip Extensors  1. Lie on your abdomen on a firm surface. You can put a pillow under your hips if that is more comfortable. 2. Tense the muscles in your buttocks and lift your left / right leg about 4-6 inches (10-15 cm). Keep your knee straight as you lift your leg. 3. Hold this position for 3 seconds. 4. Slowly lower your leg to the starting position. 5. Let your leg relax completely after each repetition. Repeat 2 times. Complete this exercise 3 times per week. Document Released: 10/26/2005 Document Revised: 09/05/2016 Document Reviewed: 10/18/2015 Elsevier Interactive Patient Education  2017 [employer].

## 2018-12-07 ENCOUNTER — Telehealth: Payer: Self-pay | Admitting: Family Medicine

## 2018-12-07 ENCOUNTER — Telehealth: Payer: Self-pay

## 2018-12-07 MED ORDER — DICLOFENAC SODIUM 1 % TD GEL: 2.0000 g | Freq: Four times a day (QID) | TRANSDERMAL | 1 refills | Status: DC

## 2018-12-07 NOTE — Telephone Encounter (Signed)
Copied from [place] [phone]. Topic: Quick Communication - See Telephone Encounter >> Dec 07, 2018 10:24 AM Rutherford Nail, NT wrote: CRM for notification. See Telephone encounter for: 12/07/18. Dorrine with [facility] in [facility] calling and states that the patient is saying that there was supposed to be some cream or ointment sent to them yesterday. Advised that the only thing I have listed that was prescribed yesterday was diclofenac sodium (VOLTAREN) 1 % GEL and it was called in. Dorrine states that they have not received that order. Would like someone to call with that verbal order again. Please advise. [id number] - [place], [place] - [address] AT [place]

## 2018-12-07 NOTE — Telephone Encounter (Signed)
PA approved.   PA Case: [id number], Status: Approved, Coverage Starts on: 12/24/2017, Coverage Ends on: 12/26/2019. Questions? Contact [phone]

## 2018-12-07 NOTE — Telephone Encounter (Signed)
PA initiated via Covermymeds; KEY: [id number]. Awaiting determination.

## 2018-12-07 NOTE — Telephone Encounter (Signed)
Sent in

## 2018-12-17 ENCOUNTER — Other Ambulatory Visit: Payer: Self-pay | Admitting: Family Medicine

## 2018-12-30 ENCOUNTER — Other Ambulatory Visit: Payer: Self-pay | Admitting: Family Medicine

## 2019-01-28 ENCOUNTER — Telehealth: Payer: Self-pay

## 2019-01-28 DIAGNOSIS — K7460 Unspecified cirrhosis of liver: Secondary | ICD-10-CM

## 2019-01-28 NOTE — Telephone Encounter (Signed)
Patient notified of [name] for 02/01/19 8:00 at [facility].  She is asked to arrive at 7:45 and be NPO after midnight.  She will also come for labs after [name]

## 2019-02-01 ENCOUNTER — Ambulatory Visit (HOSPITAL_COMMUNITY)
Admission: RE | Admit: 2019-02-01 | Discharge: 2019-02-01 | Disposition: A | Discharge status: Home / Self Care | Payer: Medicare HMO | Source: Ambulatory Visit | Attending: Gastroenterology | Admitting: Gastroenterology

## 2019-02-01 ENCOUNTER — Other Ambulatory Visit: Payer: Medicare HMO

## 2019-02-01 DIAGNOSIS — K7460 Unspecified cirrhosis of liver: Secondary | ICD-10-CM

## 2019-02-04 LAB — AFP TUMOR MARKER: AFP-Tumor Marker: 3.7 ng/mL

## 2019-02-12 ENCOUNTER — Encounter: Payer: Self-pay | Admitting: Advanced Practice Midwife

## 2019-02-12 ENCOUNTER — Ambulatory Visit: Payer: Medicare HMO | Admitting: Advanced Practice Midwife

## 2019-02-12 ENCOUNTER — Other Ambulatory Visit (HOSPITAL_COMMUNITY)
Admission: RE | Admit: 2019-02-12 | Discharge: 2019-02-12 | Disposition: A | Discharge status: Home / Self Care | Payer: Medicare HMO | Source: Ambulatory Visit | Attending: Advanced Practice Midwife | Admitting: Advanced Practice Midwife

## 2019-02-12 VITALS — BP 124/57 | HR 93 | Wt 198.0 lb

## 2019-02-12 DIAGNOSIS — N898 Other specified noninflammatory disorders of vagina: Secondary | ICD-10-CM | POA: Diagnosis not present

## 2019-02-12 DIAGNOSIS — N952 Postmenopausal atrophic vaginitis: Secondary | ICD-10-CM

## 2019-02-12 DIAGNOSIS — B356 Tinea cruris: Secondary | ICD-10-CM | POA: Insufficient documentation

## 2019-02-12 MED ORDER — FLUCONAZOLE 150 MG PO TABS: 150.0000 mg | ORAL_TABLET | Freq: Once | ORAL | 1 refills | Status: AC

## 2019-02-12 MED ORDER — CLOTRIMAZOLE-BETAMETHASONE 1-0.05 % EX CREA: 1.0000 "application " | TOPICAL_CREAM | Freq: Two times a day (BID) | CUTANEOUS | 1 refills | Status: DC | PRN

## 2019-02-12 NOTE — Patient Instructions (Signed)
Vaginal Yeast infection, Adult  Vaginal yeast infection is a condition that causes vaginal discharge as well as soreness, swelling, and redness (inflammation) of the vagina. This is a common condition. Some women get this infection frequently. What are the causes? This condition is caused by a change in the normal balance of the yeast (candida) and bacteria that live in the vagina. This change causes an overgrowth of yeast, which causes the inflammation. What increases the risk? The condition is more likely to develop in women who:  Take antibiotic medicines.  Have diabetes.  Take birth control pills.  Are pregnant.  Douche often.  Have a weak body defense system (immune system).  Have been taking steroid medicines for a long time.  Frequently wear tight clothing. What are the signs or symptoms? Symptoms of this condition include:  White, thick, creamy vaginal discharge.  Swelling, itching, redness, and irritation of the vagina. The lips of the vagina (vulva) may be affected as well.  Pain or a burning feeling while urinating.  Pain during sex. How is this diagnosed? This condition is diagnosed based on:  Your medical history.  A physical exam.  A pelvic exam. Your health care provider will examine a sample of your vaginal discharge under a microscope. Your health care provider may send this sample for testing to confirm the diagnosis. How is this treated? This condition is treated with medicine. Medicines may be over-the-counter or prescription. You may be told to use one or more of the following:  Medicine that is taken by mouth (orally).  Medicine that is applied as a cream (topically).  Medicine that is inserted directly into the vagina (suppository). Follow these instructions at home:  Lifestyle  Do not have sex until your health care provider approves. Tell your sex partner that you have a yeast infection. That person should go to his or her health care  provider and ask if they should also be treated.  Do not wear tight clothes, such as pantyhose or tight pants.  Wear breathable cotton underwear. General instructions  Take or apply over-the-counter and prescription medicines only as told by your health care provider.  Eat more yogurt. This may help to keep your yeast infection from returning.  Do not use tampons until your health care provider approves.  Try taking a sitz bath to help with discomfort. This is a warm water bath that is taken while you are sitting down. The water should only come up to your hips and should cover your buttocks. Do this 3-4 times per day or as told by your health care provider.  Do not douche.  If you have diabetes, keep your blood sugar levels under control.  Keep all follow-up visits as told by your health care provider. This is important. Contact a health care provider if:  You have a fever.  Your symptoms go away and then return.  Your symptoms do not get better with treatment.  Your symptoms get worse.  You have new symptoms.  You develop blisters in or around your vagina.  You have blood coming from your vagina and it is not your menstrual period.  You develop pain in your abdomen. Summary  Vaginal yeast infection is a condition that causes discharge as well as soreness, swelling, and redness (inflammation) of the vagina.  This condition is treated with medicine. Medicines may be over-the-counter or prescription.  Take or apply over-the-counter and prescription medicines only as told by your health care provider.  Do not douche.   Do not have sex or use tampons until your health care provider approves.  Contact a health care provider if your symptoms do not get better with treatment or your symptoms go away and then return. This information is not intended to replace advice given to you by your health care provider. Make sure you discuss any questions you have with your health care  provider. Document Released: 09/21/2005 Document Revised: 04/30/2018 Document Reviewed: 04/30/2018 Elsevier Interactive Patient Education  2019 Else[employer] Inc[employer]  Body Ringworm Body ringworm is an infection of the skin that often causes a ring-shaped rash. Body ringworm can affect any part of your skin. It can spread easily to others. Body ringworm is also called tinea corporis. What are the causes? This condition is caused by funguses called dermatophytes. The condition develops when these funguses grow out of control on the skin. You can get this condition if you touch a person or animal that has it. You can also get it if you share clothing, bedding, towels, or any other object with an infected person or pet. What increases the risk? This condition is more likely to develop in:  Athletes who often make skin-to-skin contact with other athletes, such as wrestlers.  People who share equipment and mats.  People with a weakened immune system. What are the signs or symptoms? Symptoms of this condition include:  Itchy, raised red spots and bumps.  Red scaly patches.  A ring-shaped rash. The rash may have: ? A clear center. ? Scales or red bumps at its center. ? Redness near its borders. ? Dry and scaly skin on or around it. How is this diagnosed? This condition can usually be diagnosed with a skin exam. A skin scraping may be taken from the affected area and examined under a microscope to see if the fungus is present. How is this treated? This condition may be treated with:  An antifungal cream or ointment.  An antifungal shampoo.  Antifungal medicines. These may be prescribed if your ringworm is severe, keeps coming back, or lasts a long time. Follow these instructions at home:  Take over-the-counter and prescription medicines only as told by your health care provider.  If you were given an antifungal cream or ointment: ? Use it as told by your health care provider. ? Wash the  infected area and dry it completely before applying the cream or ointment.  If you were given an antifungal shampoo: ? Use it as told by your health care provider. ? Leave the shampoo on your body for 3-5 minutes before rinsing.  While you have a rash: ? Wear loose clothing to stop clothes from rubbing and irritating it. ? Wash or change your bed sheets every night.  If your pet has the same infection, take your pet to see a Animal nutritionist. How is this prevented?  Practice good hygiene.  Wear sandals or shoes in public places and showers.  Do not share personal items with others.  Avoid touching red patches of skin on other people.  Avoid touching pets that have bald spots.  If you touch an animal that has a bald spot, wash your hands. Contact a health care provider if:  Your rash continues to spread after 7 days of treatment.  Your rash is not gone in 4 weeks.  The area around your rash gets red, warm, tender, and swollen. This information is not intended to replace advice given to you by your health care provider. Make sure you discuss any questions you  have with your health care provider. Document Released: 12/09/2000 Document Revised: 05/19/2016 Document Reviewed: 10/08/2015 Elsevier Interactive Patient Education  2019 [employer].

## 2019-02-12 NOTE — Progress Notes (Signed)
Patient has had vaginal itching "on and off for awhile".

## 2019-02-12 NOTE — Progress Notes (Signed)
   Subjective:    Patient ID: Stacey Cooper, female    DOB: 1935/05/27, 84 y.o.   MRN: [id number]  This is a 84 y.o. female who presents with c/o itching and irritation of vaginal area and skin folds of perineum and anal cleft.  States blood sugars have been running high.  Not happy with diabetes plan of care, looking for new doctor.  Thinks she needs more than metformin.    Gynecologic Exam  The patient's primary symptoms include genital itching. The patient's pertinent negatives include no genital lesions, genital odor or pelvic pain. This is a new problem. The current episode started in the past 7 days. The problem has been unchanged. The patient is experiencing no pain. Pertinent negatives include no abdominal pain, back pain, chills, constipation or diarrhea. Nothing aggravates the symptoms. She has tried nothing for the symptoms. (Diabetes)  Review of Systems  Constitutional: Negative for chills.  Gastrointestinal: Negative for abdominal pain, constipation and diarrhea.  Genitourinary: Negative for pelvic pain.  Musculoskeletal: Negative for back pain.       Objective:   Physical Exam Constitutional:      Appearance: Normal appearance.  HENT:     Head: Normocephalic.  Cardiovascular:     Rate and Rhythm: Normal rate.  Pulmonary:     Effort: Pulmonary effort is normal.  Abdominal:     General: There is no distension.     Tenderness: There is no abdominal tenderness. There is no guarding.  Genitourinary:    Comments: Erethema in both crural folds and along natal cleft Vulvar atrophy Minimal white discharge Cervix absent No lesions Skin:    General: Skin is dry.  Neurological:     Mental Status: She is alert.           Assessment & Plan:  Tinea corporis/cruris, Yeast vaginitis, likely exacerbated by diabetes Vaginal itching  Sample sent Discussion of keeping skin clean and dry Rx diflucan with one refull Rx Lotrisone cream for external use for symtoms Follow  with primary MD for Diabetic control

## 2019-02-13 DIAGNOSIS — R69 Illness, unspecified: Secondary | ICD-10-CM | POA: Diagnosis not present

## 2019-02-13 LAB — CERVICOVAGINAL ANCILLARY ONLY
Bacterial vaginitis: NEGATIVE
Candida vaginitis: POSITIVE — AB

## 2019-02-18 ENCOUNTER — Other Ambulatory Visit: Payer: Self-pay | Admitting: Family Medicine

## 2019-02-18 DIAGNOSIS — H40013 Open angle with borderline findings, low risk, bilateral: Secondary | ICD-10-CM | POA: Diagnosis not present

## 2019-02-18 DIAGNOSIS — E119 Type 2 diabetes mellitus without complications: Secondary | ICD-10-CM | POA: Diagnosis not present

## 2019-02-18 DIAGNOSIS — H353131 Nonexudative age-related macular degeneration, bilateral, early dry stage: Secondary | ICD-10-CM | POA: Diagnosis not present

## 2019-02-18 DIAGNOSIS — H029 Unspecified disorder of eyelid: Secondary | ICD-10-CM | POA: Diagnosis not present

## 2019-02-18 DIAGNOSIS — H04123 Dry eye syndrome of bilateral lacrimal glands: Secondary | ICD-10-CM | POA: Diagnosis not present

## 2019-02-18 LAB — HM DIABETES EYE EXAM

## 2019-02-20 ENCOUNTER — Encounter: Payer: Self-pay | Admitting: Family Medicine

## 2019-02-20 ENCOUNTER — Telehealth: Payer: Self-pay | Admitting: *Deleted

## 2019-02-20 NOTE — Telephone Encounter (Signed)
Received Diabetic Eye Exam Report from [facility]; forwarded to provider/SLS 02/26

## 2019-02-21 ENCOUNTER — Telehealth: Payer: Self-pay | Admitting: *Deleted

## 2019-02-21 ENCOUNTER — Encounter: Payer: Self-pay | Admitting: Family Medicine

## 2019-02-21 NOTE — Telephone Encounter (Signed)
Received Diabetic Eye Exam Report from [facility]; forwarded to provider/SLS 02/27

## 2019-03-01 DIAGNOSIS — L918 Other hypertrophic disorders of the skin: Secondary | ICD-10-CM | POA: Diagnosis not present

## 2019-03-01 DIAGNOSIS — L400 Psoriasis vulgaris: Secondary | ICD-10-CM | POA: Diagnosis not present

## 2019-03-01 DIAGNOSIS — L570 Actinic keratosis: Secondary | ICD-10-CM | POA: Diagnosis not present

## 2019-03-01 DIAGNOSIS — D692 Other nonthrombocytopenic purpura: Secondary | ICD-10-CM | POA: Diagnosis not present

## 2019-03-01 DIAGNOSIS — L821 Other seborrheic keratosis: Secondary | ICD-10-CM | POA: Diagnosis not present

## 2019-03-01 DIAGNOSIS — Z85828 Personal history of other malignant neoplasm of skin: Secondary | ICD-10-CM | POA: Diagnosis not present

## 2019-05-23 ENCOUNTER — Other Ambulatory Visit: Payer: Self-pay | Admitting: Family Medicine

## 2019-05-24 DIAGNOSIS — R69 Illness, unspecified: Secondary | ICD-10-CM | POA: Diagnosis not present

## 2019-06-07 ENCOUNTER — Ambulatory Visit: Payer: Medicare HMO | Admitting: Family Medicine

## 2019-06-14 ENCOUNTER — Other Ambulatory Visit: Payer: Self-pay

## 2019-06-14 ENCOUNTER — Encounter: Payer: Self-pay | Admitting: Family Medicine

## 2019-06-14 ENCOUNTER — Ambulatory Visit (INDEPENDENT_AMBULATORY_CARE_PROVIDER_SITE_OTHER): Payer: Medicare HMO | Admitting: Family Medicine

## 2019-06-14 VITALS — BP 120/78 | HR 112 | Temp 98.4°F | Ht 63.5 in | Wt 204.0 lb

## 2019-06-14 DIAGNOSIS — E782 Mixed hyperlipidemia: Secondary | ICD-10-CM

## 2019-06-14 DIAGNOSIS — E119 Type 2 diabetes mellitus without complications: Secondary | ICD-10-CM

## 2019-06-14 DIAGNOSIS — I10 Essential (primary) hypertension: Secondary | ICD-10-CM

## 2019-06-14 LAB — LIPID PANEL
Cholesterol: 129 mg/dL (ref 0–200)
HDL: 46.1 mg/dL (ref >39.00)
LDL Cholesterol: 60 mg/dL (ref 0–99)
NonHDL: 82.76
Total CHOL/HDL Ratio: 3
Triglycerides: 116 mg/dL (ref 0.0–149.0)
VLDL: 23.2 mg/dL (ref 0.0–40.0)

## 2019-06-14 LAB — COMPREHENSIVE METABOLIC PANEL
ALT: 10 U/L (ref 0–35)
AST: 19 U/L (ref 0–37)
Albumin: 3.9 g/dL (ref 3.5–5.2)
Alkaline Phosphatase: 110 U/L (ref 39–117)
BUN: 21 mg/dL (ref 6–23)
CO2: 24 mEq/L (ref 19–32)
Calcium: 9.3 mg/dL (ref 8.4–10.5)
Chloride: 104 mEq/L (ref 96–112)
Creatinine, Ser: 0.81 mg/dL (ref 0.40–1.20)
GFR: 67.73 mL/min (ref >60.00)
Glucose, Bld: 146 mg/dL — ABNORMAL HIGH (ref 70–99)
Potassium: 4 mEq/L (ref 3.5–5.1)
Sodium: 140 mEq/L (ref 135–145)
Total Bilirubin: 0.8 mg/dL (ref 0.2–1.2)
Total Protein: 6.5 g/dL (ref 6.0–8.3)

## 2019-06-14 LAB — HEMOGLOBIN A1C: Hgb A1c MFr Bld: 7.6 % — ABNORMAL HIGH (ref 4.6–6.5)

## 2019-06-14 MED ORDER — TRULICITY 1.5 MG/0.5ML ~~LOC~~ SOAJ: SUBCUTANEOUS | 3 refills | Status: DC

## 2019-06-14 NOTE — Progress Notes (Signed)
Subjective:   Chief Complaint  Patient presents with  . Follow-up    Stacey Cooper is a 84 y.o. female here for follow-up of diabetes.   Marchia's self monitored glucose range is high 100's Patient denies hypoglycemic reactions. Patient does not know require insulin.   Medications include: Metformin 1 g bid; used to be on Victoza but became too expensive Diet: Fair Exercise: Active in yard  Hypertension Patient presents for hypertension follow up. She does monitor home blood pressures. She is compliant with medications. Patient has these side effects of medication: none She is adhering to a healthy diet overall. Exercise: stays active at home  Hyperlipidemia Patient presents for mixed hyperlipidemia follow up. Currently being treated with Crestor 5 mg qod and compliance with treatment thus far has been good. She complains of myalgias.  The patient is not known to have coexisting coronary artery disease.   Past Medical History:  Diagnosis Date  . Actinic keratoses 07/18/2011  . Arthralgia 05/03/2015  . Back pain 12/08/2011  . Basal cell carcinoma of skin of lip 01/24/2011  . Blood transfusion without reported diagnosis   . Cataract    bilataeral cateracts removed  . CHOLELITHIASIS, HX OF 02/07/2011  . Chronic kidney disease   . Diabetes mellitus type 2 in obese [facility]) 01/24/2011   Qualifier: Diagnosis of  By: [name], [name]  Annual eye exam with [facility] on 06/14/13   . Essential hypertension, benign 01/24/2011  . Fatty liver   . GERD 01/24/2011  . Grief reaction 01/20/2014  . HAIR LOSS 01/24/2011  . Hearing loss of both ears 06/28/2013  . Hepatic cirrhosis ([place]) 04/27/2015  . Hyperlipidemia, mixed 04/24/2012  . Insomnia 07/24/2012  . Joint pain   . MEASLES, HX OF 01/24/2011  . Morbid obesity ([place]) 01/24/2011  . NONSPEC ELEVATION OF LEVELS OF TRANSAMINASE/LDH 02/07/2011  . Peripheral neuropathy 04/20/2011  . Right hip pain 05/03/2015  . SQUAMOUS CELL CARCINOMA OTHER SPEC  SITES SKIN 01/24/2011  . Swelling   . Tubular adenoma of colon 04/2015  . Umbilical hernia 7/68/1157  . URINARY INCONTINENCE 02/07/2011  . UTI'S, HX OF 01/24/2011  . Vulvar atrophy 03/17/2017     Related testing: Date of retinal exam: Done Pneumovax: done Flu Shot: done  Review of Systems: Pulmonary:  No SOB Cardiovascular:  No chest pain  Objective:  BP 120/78 (BP Location: Left Arm, Patient Position: Sitting, Cuff Size: Large)   Pulse (!) 112   Temp 98.4 F (36.9 C) (Oral)   Ht 5' 3.5" (1.613 m)   Wt 204 lb (92.5 kg)   SpO2 94%   BMI 35.57 kg/m  General:  Well developed, well nourished, in no apparent distress Skin:  Warm, no pallor or diaphoresis Head:  Normocephalic, atraumatic Eyes:  Pupils equal and round, sclera anicteric without injection  Lungs:  CTAB, no access msc use Cardio:  RRR, no LE edema Psych: Age appropriate judgment and insight  Assessment:   Type 2 diabetes mellitus without complication, without long-term current use of insulin (HCC) - Plan: Lipid panel, Hemoglobin A1c, Comprehensive metabolic panel  Essential hypertension, benign  Hyperlipidemia, mixed    Plan:   Orders as above. Try to order Trulicity, may have to try something like Actos if cost is an issue.  Counseled on diet and exercise. F/u in 6 mo. The patient voiced understanding and agreement to the plan.  M[place]ory, DO 06/14/19 8:48 AM

## 2019-06-14 NOTE — Patient Instructions (Signed)
Give [name] 2-3 business days to get the results of your labs back.   Keep the diet clean and stay active.  Let me know if the new medicine too expensive.   Let us know if you need anything.

## 2019-06-18 ENCOUNTER — Telehealth: Payer: Self-pay | Admitting: Family Medicine

## 2019-06-18 NOTE — Telephone Encounter (Signed)
Pt states she has questions for Robin about the Dulaglutide (TRULICITY) 1.5 YY/1.5TX SOPN that Dr. Nani Ravens gave her on Friday. Pt states that what is said on the directions on the RX are confusing for her and she needs to know what she is supposed to be doing. Pt can be reached at [phone].

## 2019-06-18 NOTE — Telephone Encounter (Signed)
Clarified with the patient She will pickup at the office a free sample of the Trulicity #2 single dose pens 0.75 mg once weekly. Patient instructed to come to the front desk/wear a mask///let the front staff know she is picking up a sample and is in the frig. The patient verbalized understanding.

## 2019-06-20 DIAGNOSIS — R69 Illness, unspecified: Secondary | ICD-10-CM | POA: Diagnosis not present

## 2019-07-06 ENCOUNTER — Other Ambulatory Visit: Payer: Self-pay | Admitting: Family Medicine

## 2019-07-22 DIAGNOSIS — R69 Illness, unspecified: Secondary | ICD-10-CM | POA: Diagnosis not present

## 2019-08-07 ENCOUNTER — Telehealth: Payer: Self-pay

## 2019-08-07 DIAGNOSIS — K7460 Unspecified cirrhosis of liver: Secondary | ICD-10-CM

## 2019-08-07 NOTE — Telephone Encounter (Signed)
Patient is scheduled for RUQ [name] at [facility] for 08/12/19 6:45 arrival for 7:00 exam.  Patient notified of the appt dates and instructions.

## 2019-08-07 NOTE — Telephone Encounter (Signed)
-----   Message from Marlon Pel, RN sent at 02/01/2019  1:06 PM EST ----- Needs [name] see results 2/7- stark

## 2019-08-08 ENCOUNTER — Telehealth: Payer: Self-pay | Admitting: Gastroenterology

## 2019-08-08 DIAGNOSIS — K7460 Unspecified cirrhosis of liver: Secondary | ICD-10-CM

## 2019-08-08 NOTE — Telephone Encounter (Signed)
A user error has taken place: Error °

## 2019-08-08 NOTE — Telephone Encounter (Signed)
Patient notified she is due for AFP as well she will come by on MOnday after her [name]

## 2019-08-08 NOTE — Telephone Encounter (Signed)
Pt would like to know whether she needs lab works prior to [name].

## 2019-08-12 ENCOUNTER — Other Ambulatory Visit: Payer: Self-pay

## 2019-08-12 ENCOUNTER — Ambulatory Visit (HOSPITAL_COMMUNITY)
Admission: RE | Admit: 2019-08-12 | Discharge: 2019-08-12 | Disposition: A | Discharge status: Home / Self Care | Payer: Medicare HMO | Source: Ambulatory Visit | Attending: Gastroenterology | Admitting: Gastroenterology

## 2019-08-12 ENCOUNTER — Other Ambulatory Visit: Payer: Medicare HMO

## 2019-08-12 DIAGNOSIS — K7460 Unspecified cirrhosis of liver: Secondary | ICD-10-CM

## 2019-08-12 DIAGNOSIS — K8020 Calculus of gallbladder without cholecystitis without obstruction: Secondary | ICD-10-CM | POA: Diagnosis not present

## 2019-08-13 LAB — AFP TUMOR MARKER: AFP-Tumor Marker: 3.7 ng/mL

## 2019-08-14 ENCOUNTER — Other Ambulatory Visit: Payer: Self-pay | Admitting: Family Medicine

## 2019-08-15 DIAGNOSIS — R69 Illness, unspecified: Secondary | ICD-10-CM | POA: Diagnosis not present

## 2019-08-17 DIAGNOSIS — Z1231 Encounter for screening mammogram for malignant neoplasm of breast: Secondary | ICD-10-CM | POA: Diagnosis not present

## 2019-08-17 DIAGNOSIS — Z803 Family history of malignant neoplasm of breast: Secondary | ICD-10-CM | POA: Diagnosis not present

## 2019-08-26 ENCOUNTER — Telehealth: Payer: Self-pay | Admitting: Family Medicine

## 2019-08-26 NOTE — Telephone Encounter (Signed)
Let's get her some samples if able. Ty.

## 2019-08-26 NOTE — Telephone Encounter (Signed)
Called informed her of samples in the frig. For her She will pickup on Tuesday 08/27/2019. Gave her 2 boxes -- 2 single dose pens in each box 1.5 mg once weekly. Will cover her enough until her next appt with PCP on 09/16/2019.

## 2019-08-26 NOTE — Telephone Encounter (Signed)
The patient called to inform she is almost in the donut hole for insurance.  The Trulicity is too expensive and is no way she will be able to continue on this. (We do have some samples).

## 2019-09-07 ENCOUNTER — Other Ambulatory Visit: Payer: Self-pay | Admitting: Family Medicine

## 2019-09-15 DIAGNOSIS — R69 Illness, unspecified: Secondary | ICD-10-CM | POA: Diagnosis not present

## 2019-09-16 ENCOUNTER — Other Ambulatory Visit: Payer: Self-pay

## 2019-09-16 ENCOUNTER — Encounter: Payer: Self-pay | Admitting: Family Medicine

## 2019-09-16 ENCOUNTER — Ambulatory Visit (INDEPENDENT_AMBULATORY_CARE_PROVIDER_SITE_OTHER): Payer: Medicare HMO | Admitting: Family Medicine

## 2019-09-16 VITALS — BP 120/80 | HR 94 | Temp 96.1°F | Ht 63.0 in | Wt 199.0 lb

## 2019-09-16 DIAGNOSIS — I10 Essential (primary) hypertension: Secondary | ICD-10-CM

## 2019-09-16 DIAGNOSIS — M7989 Other specified soft tissue disorders: Secondary | ICD-10-CM | POA: Insufficient documentation

## 2019-09-16 DIAGNOSIS — E119 Type 2 diabetes mellitus without complications: Secondary | ICD-10-CM

## 2019-09-16 LAB — HEMOGLOBIN A1C: Hgb A1c MFr Bld: 6.9 % — ABNORMAL HIGH (ref 4.6–6.5)

## 2019-09-16 MED ORDER — PIOGLITAZONE HCL 30 MG PO TABS: 30.0000 mg | ORAL_TABLET | Freq: Every day | ORAL | 5 refills | Status: DC

## 2019-09-16 NOTE — Progress Notes (Signed)
Subjective:   Chief Complaint  Patient presents with  . Follow-up    Stacey Cooper is a 84 y.o. female here for follow-up of diabetes.   Sheina's self monitored glucose range is low 100's.  Patient denies hypoglycemic reactions. She checks her glucose levels 1 time per day. Patient does not require insulin.   Medications include: metformin, Trulicity Exercise: active in yard Diet is healthy.  Hypertension Patient presents for hypertension follow up. She does not monitor home blood pressures. She is compliant with medication- lisinopril 5 mg/d. Patient has these side effects of medication: none She is adhering to a healthy diet overall. Exercise: active outside  +intermittent swelling of ankles. Takes Lasix prn. Last filled a 30 d supply 2 yrs ago. Works well when she does take it. Usually once in a while, not a succession of days. No current swelling, sob.   Past Medical History:  Diagnosis Date  . Actinic keratoses 07/18/2011  . Arthralgia 05/03/2015  . Back pain 12/08/2011  . Basal cell carcinoma of skin of lip 01/24/2011  . Blood transfusion without reported diagnosis   . Cataract    bilataeral cateracts removed  . CHOLELITHIASIS, HX OF 02/07/2011  . Chronic kidney disease   . Diabetes mellitus type 2 in obese [facility]) 01/24/2011   Qualifier: Diagnosis of  By: [name], [name]  Annual eye exam with [facility] on 06/14/13   . Essential hypertension, benign 01/24/2011  . Fatty liver   . GERD 01/24/2011  . Grief reaction 01/20/2014  . HAIR LOSS 01/24/2011  . Hearing loss of both ears 06/28/2013  . Hepatic cirrhosis ([place]) 04/27/2015  . Hyperlipidemia, mixed 04/24/2012  . Insomnia 07/24/2012  . Joint pain   . MEASLES, HX OF 01/24/2011  . Morbid obesity ([place]) 01/24/2011  . NONSPEC ELEVATION OF LEVELS OF TRANSAMINASE/LDH 02/07/2011  . Peripheral neuropathy 04/20/2011  . Right hip pain 05/03/2015  . SQUAMOUS CELL CARCINOMA OTHER SPEC SITES SKIN 01/24/2011  . Swelling   . Tubular  adenoma of colon 04/2015  . Umbilical hernia [id number]  . URINARY INCONTINENCE 02/07/2011  . UTI'S, HX OF 01/24/2011  . Vulvar atrophy 03/17/2017     Related testing: Date of retinal exam: Done Pneumovax: done Flu Shot: planning to get in 3.5 weeks  Review of Systems: Pulmonary:  No SOB Cardiovascular:  No chest pain  Objective:  BP 120/80 (BP Location: Left Arm, Patient Position: Sitting, Cuff Size: Normal)   Pulse 94   Temp (!) 96.1 F (35.6 C) (Temporal)   Ht 5\' 3"  (1.6 m)   Wt 199 lb (90.3 kg)   SpO2 96%   BMI 35.25 kg/m  General:  Well developed, well nourished, in no apparent distress Skin:  Warm, no pallor or diaphoresis Head:  Normocephalic, atraumatic Eyes:  Pupils equal and round, sclera anicteric without injection  Lungs:  CTAB, no access msc use Cardio:  RRR, no bruits, no LE edema Psych: Age appropriate judgment and insight  Assessment:   Type 2 diabetes mellitus without complication, without long-term current use of insulin (HCC) - Plan: Hemoglobin A1c  Essential hypertension, benign  Localized swelling of both lower extremities   Plan:   Orders as above. Counseled on diet and exercise. F/u in 6 mo if well controlled. The patient voiced understanding and agreement to the plan.  [place], DO 09/16/19 8:51 AM

## 2019-09-16 NOTE — Patient Instructions (Addendum)
Give [name] 2-3 business days to get the results of your labs back. Our follow up will be based on the result.   Keep the diet clean and stay active.  I recommend getting the flu shot in mid October. This suggestion would change if the CDC comes out with a different recommendation.   Let us know if you need anything.

## 2019-10-09 DIAGNOSIS — R69 Illness, unspecified: Secondary | ICD-10-CM | POA: Diagnosis not present

## 2019-10-10 ENCOUNTER — Ambulatory Visit (INDEPENDENT_AMBULATORY_CARE_PROVIDER_SITE_OTHER): Payer: Medicare HMO

## 2019-10-10 ENCOUNTER — Other Ambulatory Visit: Payer: Self-pay

## 2019-10-10 DIAGNOSIS — Z23 Encounter for immunization: Secondary | ICD-10-CM

## 2019-12-11 DIAGNOSIS — E119 Type 2 diabetes mellitus without complications: Secondary | ICD-10-CM | POA: Diagnosis not present

## 2019-12-11 DIAGNOSIS — Z791 Long term (current) use of non-steroidal anti-inflammatories (NSAID): Secondary | ICD-10-CM | POA: Diagnosis not present

## 2019-12-11 DIAGNOSIS — I10 Essential (primary) hypertension: Secondary | ICD-10-CM | POA: Diagnosis not present

## 2019-12-11 DIAGNOSIS — Z7984 Long term (current) use of oral hypoglycemic drugs: Secondary | ICD-10-CM | POA: Diagnosis not present

## 2019-12-11 DIAGNOSIS — E669 Obesity, unspecified: Secondary | ICD-10-CM | POA: Diagnosis not present

## 2019-12-11 DIAGNOSIS — M1990 Unspecified osteoarthritis, unspecified site: Secondary | ICD-10-CM | POA: Diagnosis not present

## 2019-12-11 DIAGNOSIS — Z7722 Contact with and (suspected) exposure to environmental tobacco smoke (acute) (chronic): Secondary | ICD-10-CM | POA: Diagnosis not present

## 2019-12-11 DIAGNOSIS — R32 Unspecified urinary incontinence: Secondary | ICD-10-CM | POA: Diagnosis not present

## 2019-12-11 DIAGNOSIS — Z6835 Body mass index (BMI) 35.0-35.9, adult: Secondary | ICD-10-CM | POA: Diagnosis not present

## 2019-12-11 DIAGNOSIS — E785 Hyperlipidemia, unspecified: Secondary | ICD-10-CM | POA: Diagnosis not present

## 2019-12-16 ENCOUNTER — Encounter: Payer: Medicare HMO | Admitting: Family Medicine

## 2019-12-21 DIAGNOSIS — R69 Illness, unspecified: Secondary | ICD-10-CM | POA: Diagnosis not present

## 2020-01-03 ENCOUNTER — Other Ambulatory Visit: Payer: Self-pay | Admitting: Family Medicine

## 2020-01-10 DIAGNOSIS — M79644 Pain in right finger(s): Secondary | ICD-10-CM | POA: Diagnosis not present

## 2020-01-10 DIAGNOSIS — M25562 Pain in left knee: Secondary | ICD-10-CM | POA: Diagnosis not present

## 2020-01-15 ENCOUNTER — Other Ambulatory Visit: Payer: Self-pay

## 2020-01-15 ENCOUNTER — Ambulatory Visit (INDEPENDENT_AMBULATORY_CARE_PROVIDER_SITE_OTHER): Payer: Medicare HMO | Admitting: Family Medicine

## 2020-01-15 ENCOUNTER — Encounter: Payer: Self-pay | Admitting: Family Medicine

## 2020-01-15 DIAGNOSIS — R600 Localized edema: Secondary | ICD-10-CM | POA: Diagnosis not present

## 2020-01-15 NOTE — Progress Notes (Signed)
Chief Complaint  Patient presents with  . Edema    left leg.  Off actos for 3 days sugar 124 this am.  . Fall    fall in december on left knee. was seen by ortho and they did xray and knee was ok.    Stacey Cooper here for bilateral leg swelling. Due to COVID-19 pandemic, we are interacting via telephone. I verified patient's ID using 2 identifiers. Patient agreed to proceed with visit via this method. Patient is at home, I am at office. Patient and I are present for visit.   Duration: 2 weeks Hx of prolonged bedrest, recent surgery, travel or injury? No Pain the calf? No SOB? No Personal or family history of clot or bleeding disorder? No Hx of heart failure, renal failure, hepatic failure? No  ROS:  MSK- +leg swelling, no pain Lungs- no SOB  Past Medical History:  Diagnosis Date  . Actinic keratoses 07/18/2011  . Arthralgia 05/03/2015  . Back pain 12/08/2011  . Basal cell carcinoma of skin of lip 01/24/2011  . Blood transfusion without reported diagnosis   . Cataract    bilataeral cateracts removed  . CHOLELITHIASIS, HX OF 02/07/2011  . Chronic kidney disease   . Diabetes mellitus type 2 in obese [facility]) 01/24/2011   Qualifier: Diagnosis of  By: [name], [name]  Annual eye exam with [facility] on 06/14/13   . Essential hypertension, benign 01/24/2011  . Fatty liver   . GERD 01/24/2011  . Grief reaction 01/20/2014  . HAIR LOSS 01/24/2011  . Hearing loss of both ears 06/28/2013  . Hepatic cirrhosis ([place]) 04/27/2015  . Hyperlipidemia, mixed 04/24/2012  . Insomnia 07/24/2012  . Joint pain   . MEASLES, HX OF 01/24/2011  . Morbid obesity ([place]) 01/24/2011  . NONSPEC ELEVATION OF LEVELS OF TRANSAMINASE/LDH 02/07/2011  . Peripheral neuropathy 04/20/2011  . Right hip pain 05/03/2015  . SQUAMOUS CELL CARCINOMA OTHER SPEC SITES SKIN 01/24/2011  . Swelling   . Tubular adenoma of colon 04/2015  . Umbilical hernia 0/94/7096  . URINARY INCONTINENCE 02/07/2011  . UTI'S, HX OF 01/24/2011  .  Vulvar atrophy 03/17/2017    Past Surgical History:  Procedure Laterality Date  . ABDOMINAL HYSTERECTOMY     partial, both ovaries left in place  . BCC removed below lower lip on left  26 yrs ago  . cataract extraction, b/l    . COLONOSCOPY    . Left knee arthroscopy and cleaning prior to TKR    . SHOULDER CLOSED REDUCTION Right 07/14/2018   Procedure: CLOSED REDUCTION SHOULDER, RIGHT;  Surgeon: Leandrew Koyanagi, MD;  Location: [place];  Service: Orthopedics;  Laterality: Right;  . skin bx, right anterior CW , squamous cancer, had to redo to margins  12/2010  . total knee replacement, b/l      Current Outpatient Medications:  .  Blood Glucose Monitoring Suppl (PRODIGY AUTOCODE BLOOD GLUCOSE) w/Device KIT, 1 Device by Does not apply route 3 (three) times daily between meals as needed., Disp: 1 each, Rfl: 0 .  diclofenac sodium (VOLTAREN) 1 % GEL, Apply 2 g topically 4 (four) times daily., Disp: 1 Tube, Rfl: 1 .  Lancets MISC, Check blood sugars qam and once daily prn, Disp: 200 each, Rfl: 3 .  lisinopril (ZESTRIL) 5 MG tablet, TAKE 1 TABLET BY MOUTH EVERY DAY, Disp: 90 tablet, Rfl: 1 .  metFORMIN (GLUCOPHAGE) 500 MG tablet, TAKE 2 TABLETS BY MOUTH TWICE A DAY AND 1 TABLET BY MOUTH  AT NOON, Disp: 450 tablet, Rfl: 1 .  ONETOUCH ULTRA test strip, USE TO TEST BLOOD SUGAR TWICE A DAY AS DIRECTED, Disp: 100 each, Rfl: 6 .  rosuvastatin (CRESTOR) 5 MG tablet, TAKE 1 TABLET BY MOUTH EVERY OTHER DAY, Disp: 45 tablet, Rfl: 2  Exam No conversational dyspnea Age appropriate judgment and insight Nml affect and mood  Bilateral leg edema  Rx for compression stockings. Mind salt intake. Elevate legs. Stay as active as possible. F/u w ortho for knee pain.  F/u in 2 weeks to reck swelling, will consider Lasix and ck'ing labs. Total time: 11 min Pt voiced understanding and agreement to the plan.  [place], DO 01/15/20  2:38 PM

## 2020-02-14 ENCOUNTER — Telehealth: Payer: Self-pay

## 2020-02-14 ENCOUNTER — Other Ambulatory Visit: Payer: Self-pay

## 2020-02-14 DIAGNOSIS — K7460 Unspecified cirrhosis of liver: Secondary | ICD-10-CM

## 2020-02-14 NOTE — Telephone Encounter (Signed)
Appt scheduled with patient for 02/19/20 7:00 patient asked to arrive at 6:45 and be NPO after midnight

## 2020-02-14 NOTE — Telephone Encounter (Signed)
-----   Message from Marlon Pel, RN sent at 08/13/2019  9:26 AM EDT ----- Needs [name] and AFP- [place] Plan

## 2020-02-19 ENCOUNTER — Other Ambulatory Visit: Payer: Self-pay

## 2020-02-19 ENCOUNTER — Ambulatory Visit (HOSPITAL_COMMUNITY)
Admission: RE | Admit: 2020-02-19 | Discharge: 2020-02-19 | Disposition: A | Discharge status: Home / Self Care | Payer: Medicare HMO | Source: Ambulatory Visit | Attending: Gastroenterology | Admitting: Gastroenterology

## 2020-02-19 ENCOUNTER — Other Ambulatory Visit: Payer: Medicare HMO

## 2020-02-19 DIAGNOSIS — K7460 Unspecified cirrhosis of liver: Secondary | ICD-10-CM | POA: Insufficient documentation

## 2020-02-19 DIAGNOSIS — M7989 Other specified soft tissue disorders: Secondary | ICD-10-CM | POA: Diagnosis not present

## 2020-02-19 DIAGNOSIS — K8020 Calculus of gallbladder without cholecystitis without obstruction: Secondary | ICD-10-CM | POA: Diagnosis not present

## 2020-02-20 ENCOUNTER — Other Ambulatory Visit (INDEPENDENT_AMBULATORY_CARE_PROVIDER_SITE_OTHER): Payer: Medicare HMO

## 2020-02-20 ENCOUNTER — Encounter: Payer: Self-pay | Admitting: *Deleted

## 2020-02-20 DIAGNOSIS — K7460 Unspecified cirrhosis of liver: Secondary | ICD-10-CM | POA: Diagnosis not present

## 2020-02-20 DIAGNOSIS — H40013 Open angle with borderline findings, low risk, bilateral: Secondary | ICD-10-CM | POA: Diagnosis not present

## 2020-02-20 DIAGNOSIS — E113291 Type 2 diabetes mellitus with mild nonproliferative diabetic retinopathy without macular edema, right eye: Secondary | ICD-10-CM | POA: Diagnosis not present

## 2020-02-20 DIAGNOSIS — H35363 Drusen (degenerative) of macula, bilateral: Secondary | ICD-10-CM | POA: Diagnosis not present

## 2020-02-20 DIAGNOSIS — H04123 Dry eye syndrome of bilateral lacrimal glands: Secondary | ICD-10-CM | POA: Diagnosis not present

## 2020-02-20 LAB — COMPREHENSIVE METABOLIC PANEL
ALT: 11 U/L (ref 0–35)
AST: 23 U/L (ref 0–37)
Albumin: 3.8 g/dL (ref 3.5–5.2)
Alkaline Phosphatase: 111 U/L (ref 39–117)
BUN: 12 mg/dL (ref 6–23)
CO2: 26 mEq/L (ref 19–32)
Calcium: 9.7 mg/dL (ref 8.4–10.5)
Chloride: 103 mEq/L (ref 96–112)
Creatinine, Ser: 0.84 mg/dL (ref 0.40–1.20)
GFR: 64.84 mL/min (ref >60.00)
Glucose, Bld: 170 mg/dL — ABNORMAL HIGH (ref 70–99)
Potassium: 3.9 mEq/L (ref 3.5–5.1)
Sodium: 139 mEq/L (ref 135–145)
Total Bilirubin: 0.6 mg/dL (ref 0.2–1.2)
Total Protein: 7.1 g/dL (ref 6.0–8.3)

## 2020-02-20 LAB — CBC WITH DIFFERENTIAL/PLATELET
Basophils Absolute: 0 10*3/uL (ref 0.0–0.1)
Basophils Relative: 0.2 % (ref 0.0–3.0)
Eosinophils Absolute: 0.1 10*3/uL (ref 0.0–0.7)
Eosinophils Relative: 1.7 % (ref 0.0–5.0)
HCT: 36.8 % (ref 36.0–46.0)
Hemoglobin: 12.4 g/dL (ref 12.0–15.0)
Lymphocytes Relative: 14.8 % (ref 12.0–46.0)
Lymphs Abs: 1.1 10*3/uL (ref 0.7–4.0)
MCHC: 33.8 g/dL (ref 30.0–36.0)
MCV: 94.1 fl (ref 78.0–100.0)
Monocytes Absolute: 0.8 10*3/uL (ref 0.1–1.0)
Monocytes Relative: 11.9 % (ref 3.0–12.0)
Neutro Abs: 5.1 10*3/uL (ref 1.4–7.7)
Neutrophils Relative %: 71.4 % (ref 43.0–77.0)
Platelets: 164 10*3/uL (ref 150.0–400.0)
RBC: 3.91 Mil/uL (ref 3.87–5.11)
RDW: 13.3 % (ref 11.5–15.5)
WBC: 7.1 10*3/uL (ref 4.0–10.5)

## 2020-02-20 LAB — PROTIME-INR
INR: 1.1 ratio — ABNORMAL HIGH (ref 0.8–1.0)
Prothrombin Time: 12.1 s (ref 9.6–13.1)

## 2020-02-20 LAB — HM DIABETES EYE EXAM

## 2020-02-20 LAB — AFP TUMOR MARKER: AFP-Tumor Marker: 3.6 ng/mL

## 2020-02-21 ENCOUNTER — Other Ambulatory Visit: Payer: Self-pay

## 2020-02-21 DIAGNOSIS — K7460 Unspecified cirrhosis of liver: Secondary | ICD-10-CM

## 2020-02-25 ENCOUNTER — Encounter: Payer: Self-pay | Admitting: Family Medicine

## 2020-03-01 DIAGNOSIS — R69 Illness, unspecified: Secondary | ICD-10-CM | POA: Diagnosis not present

## 2020-03-12 NOTE — Progress Notes (Signed)
Nurse connected with patient 03/13/20 at  8:00 AM EDT by a telephone enabled telemedicine application and verified that I am speaking with the correct person using two identifiers. Patient stated full name and DOB. Patient gave permission to continue with virtual visit. Patient's location was at home and Nurse's location was at [place] office.   Subjective:   Stacey Cooper is a 85 y.o. female who presents for Medicare Annual (Subsequent) preventive examination.  Review of Systems: Home Safety/Smoke Alarms: Feels safe in home. Smoke alarms in place.  Uses cane as needed. Lives alone. 1 story w/ basement. Son lives behind her. Has a friend that visits her often. Walk in shower w/ grab bar.  Female:       Mammo- pt reports last done 07/27/19.      Dexa scan-pt declines           Objective:     Vitals: Unable to assess. This visit is enabled though telemedicine due to Covid 19.   Advanced Directives 03/13/2020 07/14/2018 08/08/2016  Does Patient Have a Medical Advance Directive? No No No  Would patient like information on creating a medical advance directive? No - Patient declined - Yes - Scientist, clinical (histocompatibility and immunogenetics) given    Tobacco Social History   Tobacco Use  Smoking Status Never Smoker  Smokeless Tobacco Never Used     Counseling given: Not Answered   Clinical Intake: Pain : No/denies pain     Past Medical History:  Diagnosis Date  . Actinic keratoses 07/18/2011  . Arthralgia 05/03/2015  . Back pain 12/08/2011  . Basal cell carcinoma of skin of lip 01/24/2011  . Blood transfusion without reported diagnosis   . Cataract    bilataeral cateracts removed  . CHOLELITHIASIS, HX OF 02/07/2011  . Chronic kidney disease   . Diabetes mellitus type 2 in obese [facility]) 01/24/2011   Qualifier: Diagnosis of  By: [name], [name]  Annual eye exam with [facility] on 06/14/13   . Essential hypertension, benign 01/24/2011  . Fatty liver   . GERD 01/24/2011  . Grief reaction 01/20/2014  .  HAIR LOSS 01/24/2011  . Hearing loss of both ears 06/28/2013  . Hepatic cirrhosis ([place]) 04/27/2015  . Hyperlipidemia, mixed 04/24/2012  . Insomnia 07/24/2012  . Joint pain   . MEASLES, HX OF 01/24/2011  . Morbid obesity ([place]) 01/24/2011  . NONSPEC ELEVATION OF LEVELS OF TRANSAMINASE/LDH 02/07/2011  . Peripheral neuropathy 04/20/2011  . Right hip pain 05/03/2015  . SQUAMOUS CELL CARCINOMA OTHER SPEC SITES SKIN 01/24/2011  . Swelling   . Tubular adenoma of colon 04/2015  . Umbilical hernia 03/27/271  . URINARY INCONTINENCE 02/07/2011  . UTI'S, HX OF 01/24/2011  . Vulvar atrophy 03/17/2017   Past Surgical History:  Procedure Laterality Date  . ABDOMINAL HYSTERECTOMY     partial, both ovaries left in place  . BCC removed below lower lip on left  26 yrs ago  . cataract extraction, b/l    . COLONOSCOPY    . Left knee arthroscopy and cleaning prior to TKR    . SHOULDER CLOSED REDUCTION Right 07/14/2018   Procedure: CLOSED REDUCTION SHOULDER, RIGHT;  Surgeon: Leandrew Koyanagi, MD;  Location: [place];  Service: Orthopedics;  Laterality: Right;  . skin bx, right anterior CW , squamous cancer, had to redo to margins  12/2010  . total knee replacement, b/l     Family History  Problem Relation Age of Onset  . Arthritis Mother  RA  . Myelodysplastic syndrome Mother   . Heart disease Mother   . Cancer Father        skin  . Stroke Father   . Colon polyps Father        56 in [place] intes removed carcinoid tumor confined in the tumor  . Stroke Maternal Grandmother   . Kidney failure Maternal Grandfather   . Esophageal cancer Neg Hx   . Rectal cancer Neg Hx   . Stomach cancer Neg Hx    Social History   Socioeconomic History  . Marital status: Widowed    Spouse name: Not on file  . Number of children: Not on file  . Years of education: Not on file  . Highest education level: Not on file  Occupational History  . Occupation: retired  Tobacco Use  . Smoking status: Never Smoker  . Smokeless  tobacco: Never Used  Substance and Sexual Activity  . Alcohol use: No    Alcohol/week: 0.0 standard drinks  . Drug use: No  . Sexual activity: Yes  Other Topics Concern  . Not on file  Social History Narrative  . Not on file   Social Determinants of Health   Financial Resource Strain: Low Risk   . Difficulty of Paying Living Expenses: Not hard at all  Food Insecurity: No Food Insecurity  . Worried About Charity fundraiser in the Last Year: Never true  . Ran Out of Food in the Last Year: Never true  Transportation Needs: No Transportation Needs  . Lack of Transportation (Medical): No  . Lack of Transportation (Non-Medical): No  Physical Activity:   . Days of Exercise per Week:   . Minutes of Exercise per Session:   Stress:   . Feeling of Stress :   Social Connections:   . Frequency of Communication with Friends and Family:   . Frequency of Social Gatherings with Friends and Family:   . Attends Religious Services:   . Active Member of Clubs or Organizations:   . Attends Archivist Meetings:   Marland Kitchen Marital Status:     Outpatient Encounter Medications as of 03/13/2020  Medication Sig  . Blood Glucose Monitoring Suppl (PRODIGY AUTOCODE BLOOD GLUCOSE) w/Device KIT 1 Device by Does not apply route 3 (three) times daily between meals as needed.  . diclofenac sodium (VOLTAREN) 1 % GEL Apply 2 g topically 4 (four) times daily.  . Lancets MISC Check blood sugars qam and once daily prn  . lisinopril (ZESTRIL) 5 MG tablet TAKE 1 TABLET BY MOUTH EVERY DAY  . metFORMIN (GLUCOPHAGE) 500 MG tablet TAKE 2 TABLETS BY MOUTH TWICE A DAY AND 1 TABLET BY MOUTH AT NOON  . ONETOUCH ULTRA test strip USE TO TEST BLOOD SUGAR TWICE A DAY AS DIRECTED  . pioglitazone (ACTOS) 30 MG tablet Take 30 mg by mouth daily.  . rosuvastatin (CRESTOR) 5 MG tablet TAKE 1 TABLET BY MOUTH EVERY OTHER DAY   No facility-administered encounter medications on file as of 03/13/2020.    Activities of Daily  Living In your present state of health, do you have any difficulty performing the following activities: 03/13/2020  Hearing? Y  Comment wears hearing aids.  Vision? N  Difficulty concentrating or making decisions? N  Walking or climbing stairs? Y  Dressing or bathing? N  Doing errands, shopping? N  Preparing Food and eating ? N  Using the Toilet? N  In the past six months, have you accidently leaked urine? N  Do you have problems with loss of bowel control? N  Managing your Medications? N  Managing your Finances? N  Housekeeping or managing your Housekeeping? N  Some recent data might be hidden    Patient Care Team: Shelda Pal, DO as PCP - General (Family Medicine) Monna Fam, MD as Consulting Physician (Ophthalmology) Melrose Nakayama, MD as Consulting Physician (Orthopedic Surgery) Ladene Artist, MD as Consulting Physician (Gastroenterology) Sydnee Levans, MD as Referring Physician (Dermatology)    Assessment:   This is a routine wellness examination for [place]. Physical assessment deferred to PCP.  Exercise Activities and Dietary recommendations Current Exercise Habits: The patient does not participate in regular exercise at present, Exercise limited by: None identified Diet (meal preparation, eat out, water intake, caffeinated beverages, dairy products, fruits and vegetables): in general, a "healthy" diet  , well balanced     Goals    . Patient Stated (pt-stated)     Lose 30 lbs.       Fall Risk Fall Risk  03/13/2020 05/08/2018 08/08/2016 02/19/2016 01/23/2015  Falls in the past year? 1 No Yes No No  Number falls in past yr: 1 - 1 - -  Injury with Fall? 0 - No - -  Risk for fall due to : - - History of fall(s) - -  Follow up Education provided;Falls prevention discussed - Falls prevention discussed - -   Depression Screen PHQ 2/9 Scores 03/13/2020 05/08/2018 05/15/2017 08/08/2016  PHQ - 2 Score 0 0 2 0  PHQ- 9 Score - - 8 -     Cognitive  Function Ad8 score reviewed for issues:  Issues making decisions:no  Less interest in hobbies / activities:no  Repeats questions, stories (family complaining):no  Trouble using ordinary gadgets (microwave, computer, phone):no  Forgets the month or year: no  Mismanaging finances: no  Remembering appts:no  Daily problems with thinking and/or memory:no Ad8 score is=0     MMSE - Mini Mental State Exam 08/08/2016  Orientation to time 5  Orientation to Place 5  Registration 3  Attention/ Calculation 5  Recall 3  Language- name 2 objects 2  Language- repeat 1  Language- follow 3 step command 3  Language- read & follow direction 1  Write a sentence 1  Copy design 1  Total score 30        Immunization History  Administered Date(s) Administered  . Fluad Quad(high Dose 65+) 10/10/2019  . Influenza Split 10/20/2011, 11/29/2012  . Influenza Whole 08/26/2010  . Influenza, High Dose Seasonal PF 08/23/2016, 10/09/2018  . Influenza,inj,Quad PF,6+ Mos 09/05/2013, 08/28/2014, 11/12/2015  . Pneumococcal Conjugate-13 08/28/2014  . Pneumococcal Polysaccharide-23 11/12/2015  . Tdap 12/06/2018    Screening Tests Health Maintenance  Topic Date Due  . FOOT EXAM  12/07/2019  . HEMOGLOBIN A1C  03/15/2020  . OPHTHALMOLOGY EXAM  02/19/2021  . TETANUS/TDAP  12/06/2028  . INFLUENZA VACCINE  Completed  . DEXA SCAN  Completed  . PNA vac Low Risk Adult  Completed        Plan:    Please schedule your next medicare wellness visit with me in 1 yr.  Continue to eat heart healthy diet (full of fruits, vegetables, whole grains, lean protein, water--limit salt, fat, and sugar intake) and increase physical activity as tolerated.  Continue doing brain stimulating activities (puzzles, reading, adult coloring books, staying active) to keep memory sharp.     I have personally reviewed and noted the following in the patient's chart:   . Medical  and social history . Use of alcohol,  tobacco or illicit drugs  . Current medications and supplements . Functional ability and status . Nutritional status . Physical activity . Advanced directives . List of other physicians . Hospitalizations, surgeries, and ER visits in previous 12 months . Vitals . Screenings to include cognitive, depression, and falls . Referrals and appointments  In addition, I have reviewed and discussed with patient certain preventive protocols, quality metrics, and best practice recommendations. A written personalized care plan for preventive services as well as general preventive health recommendations were provided to patient.     Shela Nevin, [place]  03/13/2020

## 2020-03-13 ENCOUNTER — Ambulatory Visit (INDEPENDENT_AMBULATORY_CARE_PROVIDER_SITE_OTHER): Payer: Medicare HMO | Admitting: *Deleted

## 2020-03-13 ENCOUNTER — Encounter: Payer: Self-pay | Admitting: *Deleted

## 2020-03-13 ENCOUNTER — Other Ambulatory Visit: Payer: Self-pay

## 2020-03-13 DIAGNOSIS — Z0000 Encounter for general adult medical examination without abnormal findings: Secondary | ICD-10-CM | POA: Diagnosis not present

## 2020-03-13 NOTE — Patient Instructions (Signed)
Please schedule your next medicare wellness visit with me in 1 yr.  Continue to eat heart healthy diet (full of fruits, vegetables, whole grains, lean protein, water--limit salt, fat, and sugar intake) and increase physical activity as tolerated.  Continue doing brain stimulating activities (puzzles, reading, adult coloring books, staying active) to keep memory sharp.    Stacey Cooper , Thank you for taking time to come for your Medicare Wellness Visit. I appreciate your ongoing commitment to your health goals. Please review the following plan we discussed and let me know if I can assist you in the future.   These are the goals we discussed: Goals    . Patient Stated (pt-stated)     Lose 30 lbs.       This is a list of the screening recommended for you and due dates:  Health Maintenance  Topic Date Due  . Complete foot exam   12/07/2019  . Hemoglobin A1C  03/15/2020  . Eye exam for diabetics  02/19/2021  . Tetanus Vaccine  12/06/2028  . Flu Shot  Completed  . DEXA scan (bone density measurement)  Completed  . Pneumonia vaccines  Completed    Preventive Care 40 Years and Older, Female Preventive care refers to lifestyle choices and visits with your health care provider that can promote health and wellness. This includes:  A yearly physical exam. This is also called an annual well check.  Regular dental and eye exams.  Immunizations.  Screening for certain conditions.  Healthy lifestyle choices, such as diet and exercise. What can I expect for my preventive care visit? Physical exam Your health care provider will check:  Height and weight. These may be used to calculate body mass index (BMI), which is a measurement that tells if you are at a healthy weight.  Heart rate and blood pressure.  Your skin for abnormal spots. Counseling Your health care provider may ask you questions about:  Alcohol, tobacco, and drug use.  Emotional well-being.  Home and relationship  well-being.  Sexual activity.  Eating habits.  History of falls.  Memory and ability to understand (cognition).  Work and work Statistician.  Pregnancy and menstrual history. What immunizations do I need?  Influenza (flu) vaccine  This is recommended every year. Tetanus, diphtheria, and pertussis (Tdap) vaccine  You may need a Td booster every 10 years. Varicella (chickenpox) vaccine  You may need this vaccine if you have not already been vaccinated. Zoster (shingles) vaccine  You may need this after age 24. Pneumococcal conjugate (PCV13) vaccine  One dose is recommended after age 28. Pneumococcal polysaccharide (PPSV23) vaccine  One dose is recommended after age 53. Measles, mumps, and rubella (MMR) vaccine  You may need at least one dose of MMR if you were born in 1957 or later. You may also need a second dose. Meningococcal conjugate (MenACWY) vaccine  You may need this if you have certain conditions. Hepatitis A vaccine  You may need this if you have certain conditions or if you travel or work in places where you may be exposed to hepatitis A. Hepatitis B vaccine  You may need this if you have certain conditions or if you travel or work in places where you may be exposed to hepatitis B. Haemophilus influenzae type b (Hib) vaccine  You may need this if you have certain conditions. You may receive vaccines as individual doses or as more than one vaccine together in one shot (combination vaccines). Talk with your health care provider  about the risks and benefits of combination vaccines. What tests do I need? Blood tests  Lipid and cholesterol levels. These may be checked every 5 years, or more frequently depending on your overall health.  Hepatitis C test.  Hepatitis B test. Screening  Lung cancer screening. You may have this screening every year starting at age 61 if you have a 30-pack-year history of smoking and currently smoke or have quit within the past  15 years.  Colorectal cancer screening. All adults should have this screening starting at age 68 and continuing until age 59. Your health care provider may recommend screening at age 81 if you are at increased risk. You will have tests every 1-10 years, depending on your results and the type of screening test.  Diabetes screening. This is done by checking your blood sugar (glucose) after you have not eaten for a while (fasting). You may have this done every 1-3 years.  Mammogram. This may be done every 1-2 years. Talk with your health care provider about how often you should have regular mammograms.  BRCA-related cancer screening. This may be done if you have a family history of breast, ovarian, tubal, or peritoneal cancers. Other tests  Sexually transmitted disease (STD) testing.  Bone density scan. This is done to screen for osteoporosis. You may have this done starting at age 37. Follow these instructions at home: Eating and drinking  Eat a diet that includes fresh fruits and vegetables, whole grains, lean protein, and low-fat dairy products. Limit your intake of foods with high amounts of sugar, saturated fats, and salt.  Take vitamin and mineral supplements as recommended by your health care provider.  Do not drink alcohol if your health care provider tells you not to drink.  If you drink alcohol: ? Limit how much you have to 0-1 drink a day. ? Be aware of how much alcohol is in your drink. In the [place] one drink equals one 12 oz bottle of beer (355 mL), one 5 oz glass of wine (148 mL), or one 1 oz glass of hard liquor (44 mL). Lifestyle  Take daily care of your teeth and gums.  Stay active. Exercise for at least 30 minutes on 5 or more days each week.  Do not use any products that contain nicotine or tobacco, such as cigarettes, e-cigarettes, and chewing tobacco. If you need help quitting, ask your health care provider.  If you are sexually active, practice safe sex. Use a  condom or other form of protection in order to prevent STIs (sexually transmitted infections).  Talk with your health care provider about taking a low-dose aspirin or statin. What's next?  Go to your health care provider once a year for a well check visit.  Ask your health care provider how often you should have your eyes and teeth checked.  Stay up to date on all vaccines. This information is not intended to replace advice given to you by your health care provider. Make sure you discuss any questions you have with your health care provider. Document Revised: 12/06/2018 Document Reviewed: 12/06/2018 Elsevier Patient Education  2020 [employer].

## 2020-03-16 ENCOUNTER — Encounter: Payer: Medicare HMO | Admitting: Family Medicine

## 2020-03-16 ENCOUNTER — Other Ambulatory Visit: Payer: Self-pay | Admitting: Family Medicine

## 2020-03-24 DIAGNOSIS — R69 Illness, unspecified: Secondary | ICD-10-CM | POA: Diagnosis not present

## 2020-03-30 ENCOUNTER — Other Ambulatory Visit: Payer: Self-pay

## 2020-03-31 ENCOUNTER — Ambulatory Visit (INDEPENDENT_AMBULATORY_CARE_PROVIDER_SITE_OTHER): Payer: Medicare HMO | Admitting: Family Medicine

## 2020-03-31 ENCOUNTER — Encounter: Payer: Self-pay | Admitting: Family Medicine

## 2020-03-31 ENCOUNTER — Other Ambulatory Visit: Payer: Self-pay

## 2020-03-31 VITALS — BP 108/72 | HR 77 | Temp 95.6°F | Ht 64.0 in | Wt 218.0 lb

## 2020-03-31 DIAGNOSIS — Z0000 Encounter for general adult medical examination without abnormal findings: Secondary | ICD-10-CM

## 2020-03-31 DIAGNOSIS — E1165 Type 2 diabetes mellitus with hyperglycemia: Secondary | ICD-10-CM

## 2020-03-31 LAB — COMPREHENSIVE METABOLIC PANEL
ALT: 9 U/L (ref 0–35)
AST: 20 U/L (ref 0–37)
Albumin: 3.7 g/dL (ref 3.5–5.2)
Alkaline Phosphatase: 106 U/L (ref 39–117)
BUN: 20 mg/dL (ref 6–23)
CO2: 27 mEq/L (ref 19–32)
Calcium: 9.4 mg/dL (ref 8.4–10.5)
Chloride: 108 mEq/L (ref 96–112)
Creatinine, Ser: 0.88 mg/dL (ref 0.40–1.20)
GFR: 61.43 mL/min (ref >60.00)
Glucose, Bld: 130 mg/dL — ABNORMAL HIGH (ref 70–99)
Potassium: 4.3 mEq/L (ref 3.5–5.1)
Sodium: 141 mEq/L (ref 135–145)
Total Bilirubin: 0.6 mg/dL (ref 0.2–1.2)
Total Protein: 6.2 g/dL (ref 6.0–8.3)

## 2020-03-31 LAB — MICROALBUMIN / CREATININE URINE RATIO
Creatinine,U: 134.6 mg/dL
Microalb Creat Ratio: 1.2 mg/g (ref 0.0–30.0)
Microalb, Ur: 1.5 mg/dL (ref 0.0–1.9)

## 2020-03-31 LAB — LIPID PANEL
Cholesterol: 136 mg/dL (ref 0–200)
HDL: 40.6 mg/dL (ref >39.00)
LDL Cholesterol: 73 mg/dL (ref 0–99)
NonHDL: 95.65
Total CHOL/HDL Ratio: 3
Triglycerides: 115 mg/dL (ref 0.0–149.0)
VLDL: 23 mg/dL (ref 0.0–40.0)

## 2020-03-31 LAB — CBC
HCT: 36.2 % (ref 36.0–46.0)
Hemoglobin: 12.1 g/dL (ref 12.0–15.0)
MCHC: 33.6 g/dL (ref 30.0–36.0)
MCV: 94.6 fl (ref 78.0–100.0)
Platelets: 135 10*3/uL — ABNORMAL LOW (ref 150.0–400.0)
RBC: 3.82 Mil/uL — ABNORMAL LOW (ref 3.87–5.11)
RDW: 13.3 % (ref 11.5–15.5)
WBC: 4.6 10*3/uL (ref 4.0–10.5)

## 2020-03-31 LAB — HEMOGLOBIN A1C: Hgb A1c MFr Bld: 7.4 % — ABNORMAL HIGH (ref 4.6–6.5)

## 2020-03-31 NOTE — Progress Notes (Signed)
Chief Complaint  Patient presents with  . Annual Exam     Well Woman Stacey Cooper is here for a complete physical.   Her last physical was >1 year ago.  Current diet: in general, diet is fair. Current exercise: active in yard and house. Weight is increasing and she denies daytime fatigue. Seatbelt? Yes   Health Maintenance Shingrix- No DEXA- Yes Tetanus- Yes Pneumonia- Yes  Past Medical History:  Diagnosis Date  . Actinic keratoses 07/18/2011  . Arthralgia 05/03/2015  . Back pain 12/08/2011  . Basal cell carcinoma of skin of lip 01/24/2011  . Blood transfusion without reported diagnosis   . Cataract    bilataeral cateracts removed  . CHOLELITHIASIS, HX OF 02/07/2011  . Chronic kidney disease   . Diabetes mellitus type 2 in obese [facility]) 01/24/2011   Qualifier: Diagnosis of  By: [name], [name]  Annual eye exam with [facility] on 06/14/13   . Essential hypertension, benign 01/24/2011  . Fatty liver   . GERD 01/24/2011  . Grief reaction 01/20/2014  . HAIR LOSS 01/24/2011  . Hearing loss of both ears 06/28/2013  . Hepatic cirrhosis ([place]) 04/27/2015  . Hyperlipidemia, mixed 04/24/2012  . Insomnia 07/24/2012  . Joint pain   . MEASLES, HX OF 01/24/2011  . Morbid obesity ([place]) 01/24/2011  . NONSPEC ELEVATION OF LEVELS OF TRANSAMINASE/LDH 02/07/2011  . Peripheral neuropathy 04/20/2011  . Right hip pain 05/03/2015  . SQUAMOUS CELL CARCINOMA OTHER SPEC SITES SKIN 01/24/2011  . Swelling   . Tubular adenoma of colon 04/2015  . Umbilical hernia 5/36/1443  . URINARY INCONTINENCE 02/07/2011  . UTI'S, HX OF 01/24/2011  . Vulvar atrophy 03/17/2017     Past Surgical History:  Procedure Laterality Date  . ABDOMINAL HYSTERECTOMY     partial, both ovaries left in place  . BCC removed below lower lip on left  26 yrs ago  . cataract extraction, b/l    . COLONOSCOPY    . Left knee arthroscopy and cleaning prior to TKR    . SHOULDER CLOSED REDUCTION Right 07/14/2018   Procedure: CLOSED  REDUCTION SHOULDER, RIGHT;  Surgeon: Leandrew Koyanagi, MD;  Location: [place];  Service: Orthopedics;  Laterality: Right;  . skin bx, right anterior CW , squamous cancer, had to redo to margins  12/2010  . total knee replacement, b/l      Medications  Current Outpatient Medications on File Prior to Visit  Medication Sig Dispense Refill  . Blood Glucose Monitoring Suppl (PRODIGY AUTOCODE BLOOD GLUCOSE) w/Device KIT 1 Device by Does not apply route 3 (three) times daily between meals as needed. 1 each 0  . diclofenac sodium (VOLTAREN) 1 % GEL Apply 2 g topically 4 (four) times daily. 1 Tube 1  . Lancets MISC Check blood sugars qam and once daily prn 200 each 3  . lisinopril (ZESTRIL) 5 MG tablet TAKE 1 TABLET BY MOUTH EVERY DAY 90 tablet 1  . metFORMIN (GLUCOPHAGE) 500 MG tablet TAKE 2 TABLETS BY MOUTH TWICE A DAY AND 1 TABLET BY MOUTH AT NOON 450 tablet 1  . ONETOUCH ULTRA test strip USE TO TEST BLOOD SUGAR TWICE A DAY AS DIRECTED 100 each 6  . rosuvastatin (CRESTOR) 5 MG tablet TAKE 1 TABLET BY MOUTH EVERY OTHER DAY 45 tablet 2    Allergies Allergies  Allergen Reactions  . Contrast Media [Iodinated Diagnostic Agents] Shortness Of Breath  . Doxycycline     Severe oral ulcers.  . Lovastatin  myalgias    Review of Systems: Constitutional:  no fevers Eye:  no recent significant change in vision Ears:  No changes in hearing Nose/Mouth/Throat:  no complaints of nasal congestion, no sore throat Cardiovascular: no chest pain, +swelling Respiratory:  No shortness of breath Gastrointestinal:  No change in bowel habits, +umbilical hernia GU:  Female: +nocturia Integumentary:  no abnormal skin lesions reported Neurologic:  no headaches Endocrine:  denies unexplained weight changes  Exam BP 108/72 (BP Location: Left Arm, Patient Position: Sitting, Cuff Size: Normal)   Pulse 77   Temp (!) 95.6 F (35.3 C) (Temporal)   Ht 5' 4"  (1.626 m)   Wt 218 lb (98.9 kg)   SpO2 97%   BMI 37.42  kg/m  General:  well developed, well nourished, in no apparent distress Skin:  no significant moles, warts, or growths Head:  no masses, lesions, or tenderness Eyes:  pupils equal and round, sclera anicteric without injection Ears:  canals without lesions, TMs shiny without retraction, no obvious effusion, no erythema Nose:  nares patent, septum midline, mucosa normal, and no drainage or sinus tenderness Throat/Pharynx:  lips and gingiva without lesion; tongue and uvula midline; non-inflamed pharynx; no exudates or postnasal drainage Neck: neck supple without adenopathy, thyromegaly, or masses Lungs:  clear to auscultation, breath sounds equal bilaterally, no respiratory distress Cardio:  regular rate and rhythm, no bruits, 2+ pitting LE edema tapering mid-tibia Abdomen:  abdomen soft, nontender; bowel sounds normal; umbilical hernia noted Genital: Deferred Neuro:  gait normal; no cerebellar signs Psych: well oriented with normal range of affect and appropriate judgment/insight  Assessment and Plan  Well adult exam - Plan: CBC, Comprehensive metabolic panel, Lipid panel  Type 2 diabetes mellitus with hyperglycemia, without long-term current use of insulin (HCC) - Plan: Hemoglobin A1c, Microalbumin / creatinine urine ratio   Well 85 y.o. female. Counseled on diet and exercise. Other orders as above. Reassurance for umb hernia.  Has some paresthesias in feet. Will see if she gets better with cessation of Actos and improvement of swelling. Follow up in 6 mo for DM exam or 1 mo if swelling does not improve. The patient voiced understanding and agreement to the plan.  [place], DO 03/31/20 8:02 AM

## 2020-03-31 NOTE — Patient Instructions (Addendum)
Give [name] 2-3 business days to get the results of your labs back.   Let's see what the results of your A1c is before changing your medication.  Keep the diet clean and stay active.  The new Shingrix vaccine (for shingles) is a 2 shot series. It can make people feel low energy, achy and almost like they have the flu for 48 hours after injection. Please plan accordingly when deciding on when to get this shot. Call your pharmacy to get this. The second shot of the series is less severe regarding the side effects, but it still lasts 48 hours.   Let us know if you need anything.

## 2020-04-03 ENCOUNTER — Telehealth: Payer: Self-pay | Admitting: Family Medicine

## 2020-04-15 ENCOUNTER — Other Ambulatory Visit: Payer: Self-pay | Admitting: Family Medicine

## 2020-04-15 DIAGNOSIS — L814 Other melanin hyperpigmentation: Secondary | ICD-10-CM | POA: Diagnosis not present

## 2020-04-15 DIAGNOSIS — Z85828 Personal history of other malignant neoplasm of skin: Secondary | ICD-10-CM | POA: Diagnosis not present

## 2020-04-15 DIAGNOSIS — L821 Other seborrheic keratosis: Secondary | ICD-10-CM | POA: Diagnosis not present

## 2020-04-15 DIAGNOSIS — L400 Psoriasis vulgaris: Secondary | ICD-10-CM | POA: Diagnosis not present

## 2020-04-15 DIAGNOSIS — L570 Actinic keratosis: Secondary | ICD-10-CM | POA: Diagnosis not present

## 2020-04-18 DIAGNOSIS — R69 Illness, unspecified: Secondary | ICD-10-CM | POA: Diagnosis not present

## 2020-04-20 ENCOUNTER — Encounter: Payer: Self-pay | Admitting: Gastroenterology

## 2020-04-20 ENCOUNTER — Ambulatory Visit: Payer: Medicare HMO | Admitting: Gastroenterology

## 2020-04-20 VITALS — BP 124/66 | HR 84 | Temp 97.9°F | Ht 62.5 in | Wt 219.2 lb

## 2020-04-20 DIAGNOSIS — K7460 Unspecified cirrhosis of liver: Secondary | ICD-10-CM | POA: Diagnosis not present

## 2020-04-20 DIAGNOSIS — Z23 Encounter for immunization: Secondary | ICD-10-CM | POA: Diagnosis not present

## 2020-04-20 NOTE — Patient Instructions (Signed)
Your next Twin[facility] nurse appt is on 05/21/20 at 8:30am.   Do not have more then 1 g of Tylenol a day.   We will contact you in August to set up your repeat lab work and ultrasound.   Thank you for choosing me and [facility].  Pricilla Riffle. Dagoberto Ligas., MD., Marval Regal

## 2020-04-20 NOTE — Progress Notes (Signed)
    History of Present Illness: This is an 85 year old female with cirrhosis without decompensation, presumed secondary to NASH. Her last office visit was in Sept 2019.  RUQ [name] in February 2020 showed cirrhosis, cholelithiasis, no hepatic lesions. CMP, CBC earlier this month unremarkable except for platelet count of 135k.  AFP, PT/INR normal in Feb 2020. EGD in September 2019 was normal with no evidence of varices or portal gastropathy. Persistent but unchanged umbilical hernia without pain.  She completed COVID-19 vaccinations March 02, 2020.  Current Medications, Allergies, Past Medical History, Past Surgical History, Family History and Social History were reviewed in [employer] record.   Physical Exam: General: Well developed, well nourished, no acute distress Head: Normocephalic and atraumatic Eyes:  sclerae anicteric, EOMI Ears: Normal auditory acuity Mouth: Not examined, mask on during Covid-19 pandemic Lungs: Clear throughout to auscultation Heart: Regular rate and rhythm; no murmurs, rubs or bruits Abdomen: Soft, large, non tender and non distended. No masses, hepatosplenomegaly noted. Umbilical hernia, reducible, small. Normal Bowel sounds Rectal: Not done Musculoskeletal: Symmetrical with no gross deformities  Pulses:  Normal pulses noted Extremities: No clubbing, cyanosis, edema or deformities noted Neurological: Alert oriented x 4, grossly nonfocal Psychological:  Alert and cooperative. Normal mood and affect   Assessment and Recommendations:  1.  Cirrhosis presumed secondary to NASH without decompensation.  No more than 2 g Tylenol daily.  Avoid alcohol use, which she does, has never used alcohol. Hep A&B vaccine.  EGD to screen for varices in September 2022.  Blood work and right upper quadrant ultrasound in August.  REV in 1 year.   2.  Umbilical hernia.  Easily reducible small.  If it enlarges or if she develops pain referral for consideration of  repair.

## 2020-05-05 DIAGNOSIS — E785 Hyperlipidemia, unspecified: Secondary | ICD-10-CM | POA: Diagnosis not present

## 2020-05-05 DIAGNOSIS — Z791 Long term (current) use of non-steroidal anti-inflammatories (NSAID): Secondary | ICD-10-CM | POA: Diagnosis not present

## 2020-05-05 DIAGNOSIS — Z85828 Personal history of other malignant neoplasm of skin: Secondary | ICD-10-CM | POA: Diagnosis not present

## 2020-05-05 DIAGNOSIS — Z7984 Long term (current) use of oral hypoglycemic drugs: Secondary | ICD-10-CM | POA: Diagnosis not present

## 2020-05-05 DIAGNOSIS — E1165 Type 2 diabetes mellitus with hyperglycemia: Secondary | ICD-10-CM | POA: Diagnosis not present

## 2020-05-05 DIAGNOSIS — I10 Essential (primary) hypertension: Secondary | ICD-10-CM | POA: Diagnosis not present

## 2020-05-05 DIAGNOSIS — Z6839 Body mass index (BMI) 39.0-39.9, adult: Secondary | ICD-10-CM | POA: Diagnosis not present

## 2020-05-05 DIAGNOSIS — Z9181 History of falling: Secondary | ICD-10-CM | POA: Diagnosis not present

## 2020-05-05 DIAGNOSIS — Z833 Family history of diabetes mellitus: Secondary | ICD-10-CM | POA: Diagnosis not present

## 2020-05-12 DIAGNOSIS — E119 Type 2 diabetes mellitus without complications: Secondary | ICD-10-CM | POA: Diagnosis not present

## 2020-05-12 DIAGNOSIS — H903 Sensorineural hearing loss, bilateral: Secondary | ICD-10-CM | POA: Diagnosis not present

## 2020-05-21 ENCOUNTER — Ambulatory Visit (INDEPENDENT_AMBULATORY_CARE_PROVIDER_SITE_OTHER): Payer: Medicare HMO | Admitting: Gastroenterology

## 2020-05-21 DIAGNOSIS — Z23 Encounter for immunization: Secondary | ICD-10-CM

## 2020-05-25 IMAGING — US US ABDOMEN LIMITED
1 series · 14 of 25 positions shown · non-contrast
Comparison: Prior abdominal ultrasound 12/11/2017

CLINICAL DATA: 82-year-old female with cirrhosis

EXAM:
ULTRASOUND ABDOMEN LIMITED RIGHT UPPER QUADRANT

[Series 1: us abdomen limited · 0.19mm/px · 14 of 35 slices shown]
[im 1/35]
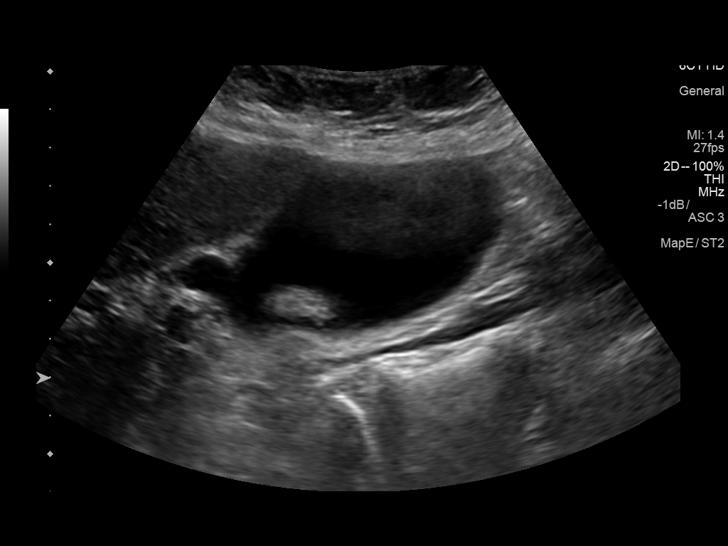
[im 3/35]
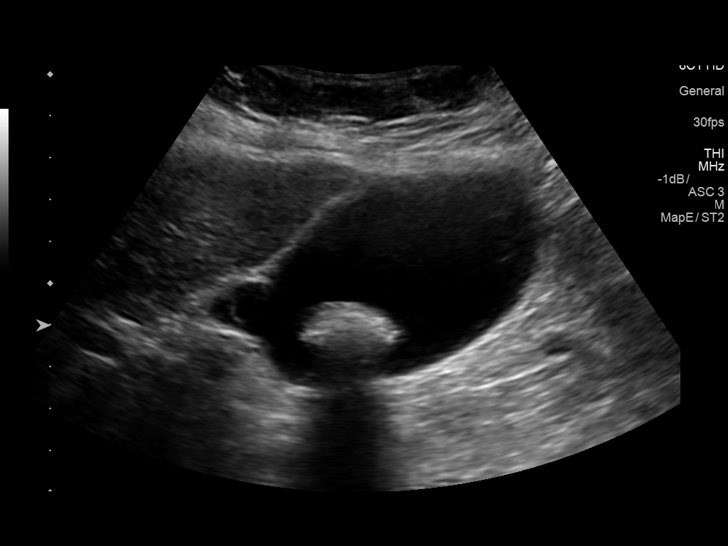
[im 6/35]
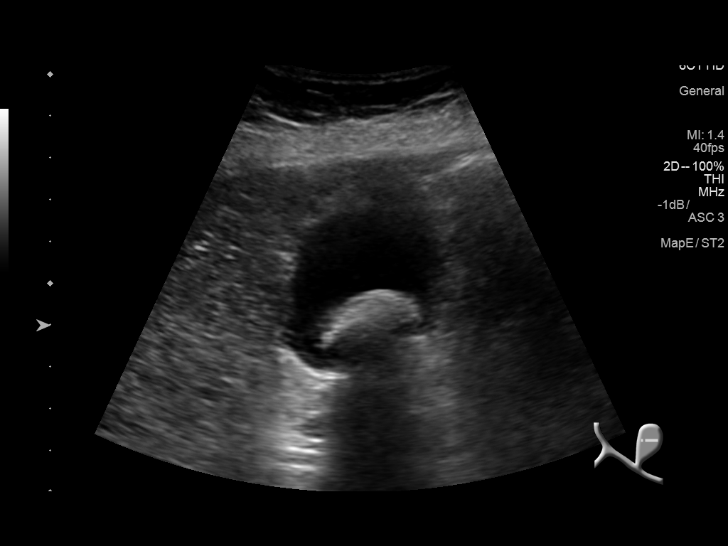
[im 9/35]
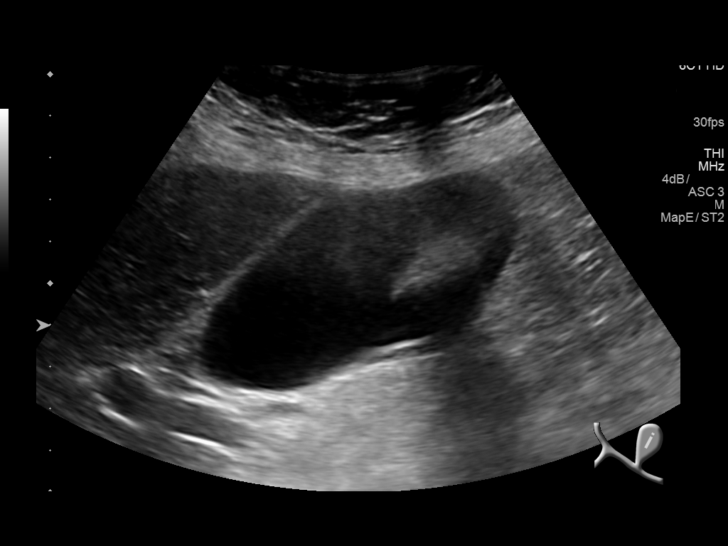
[im 12/35]
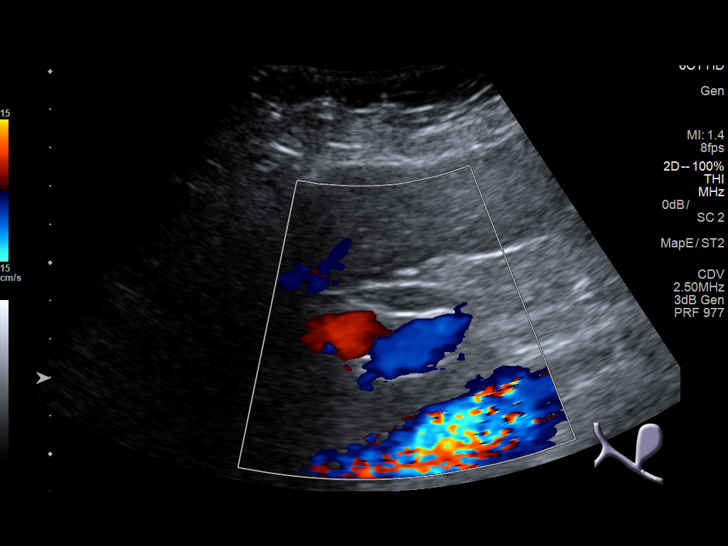
[im 13/35]
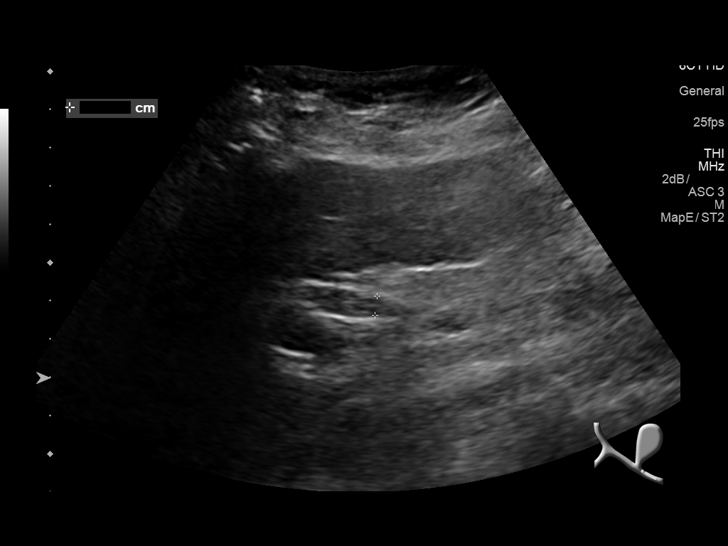
[im 16/35]
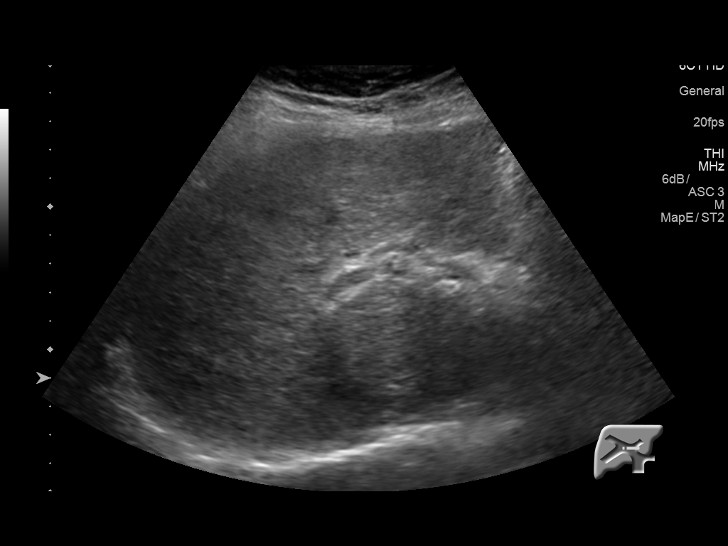
[im 19/35]
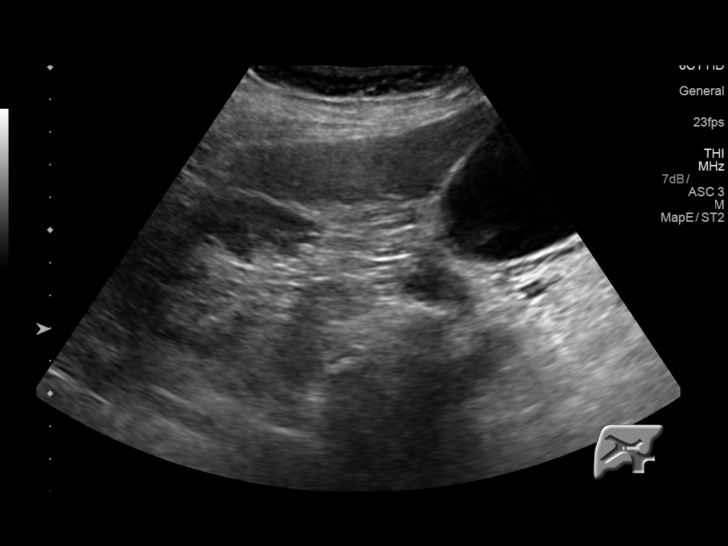
[im 22/35]
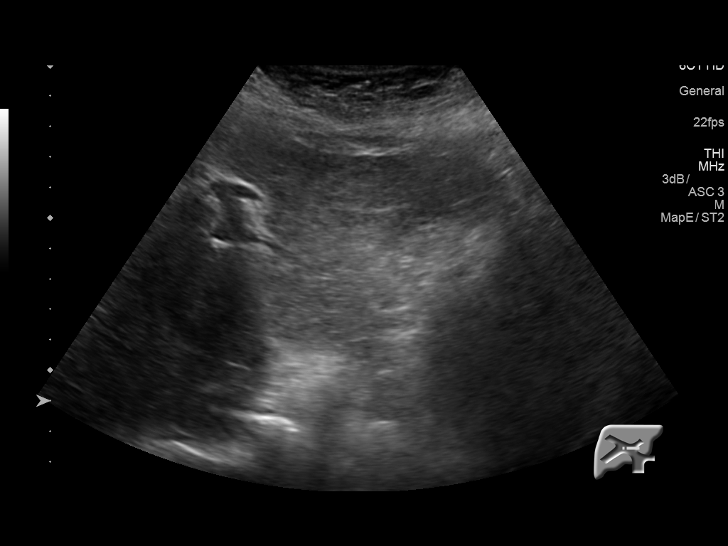
[im 23/35]
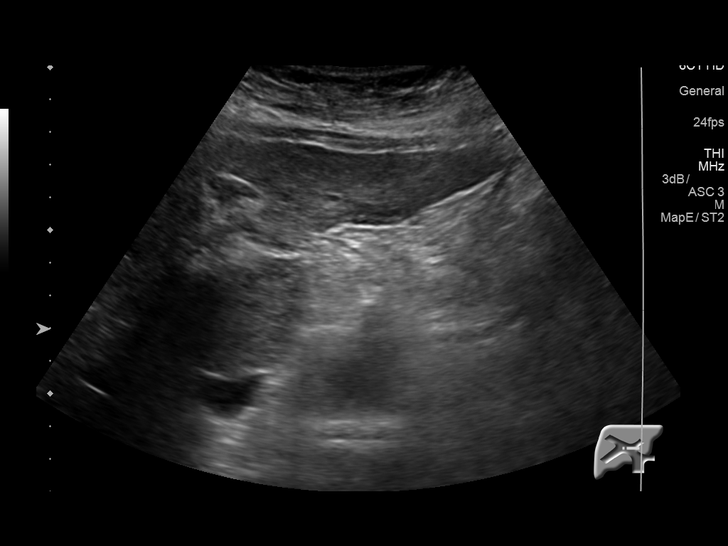
[im 26/35]
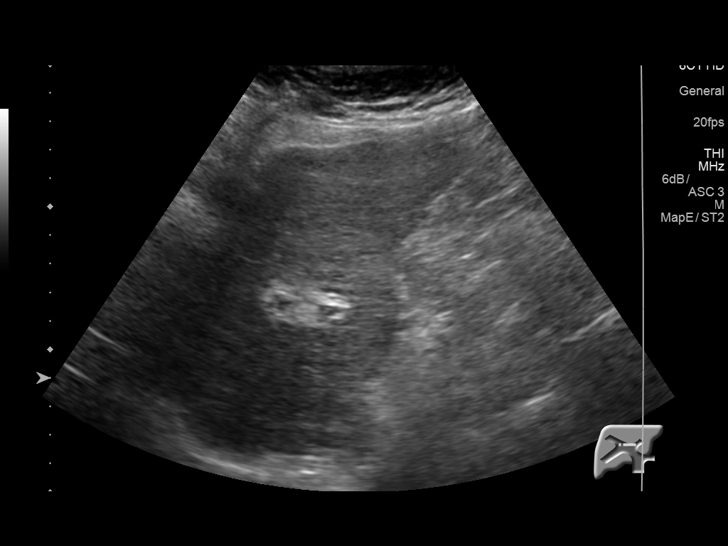
[im 29/35]
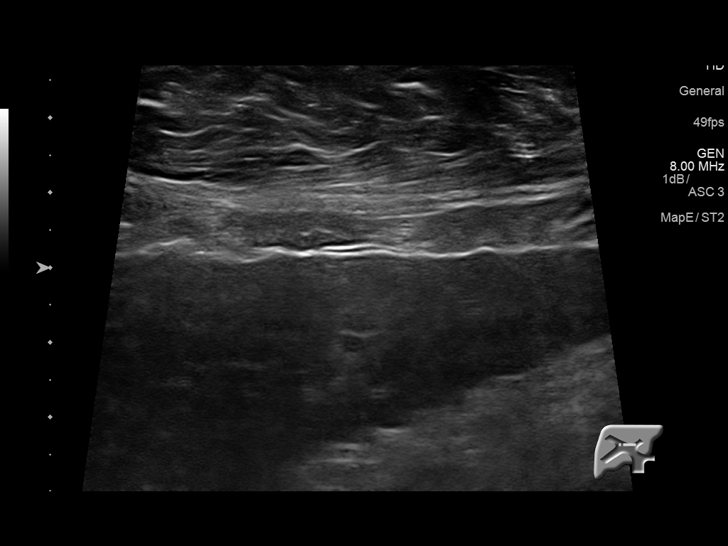
[im 32/35]
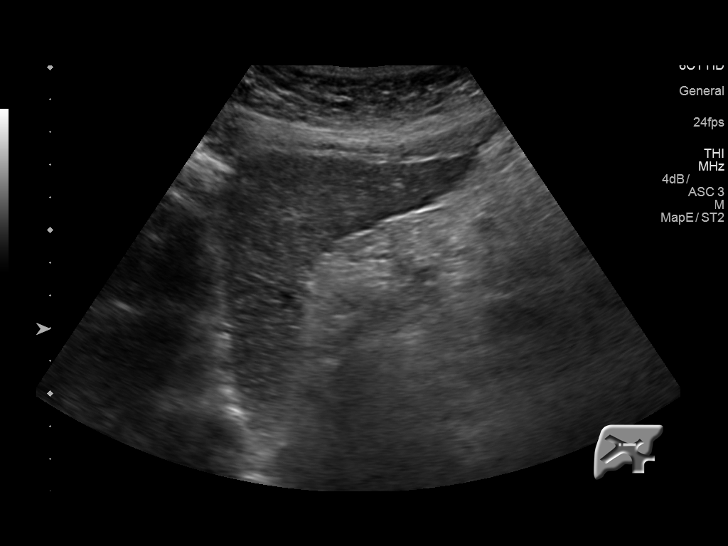
[im 35/35]
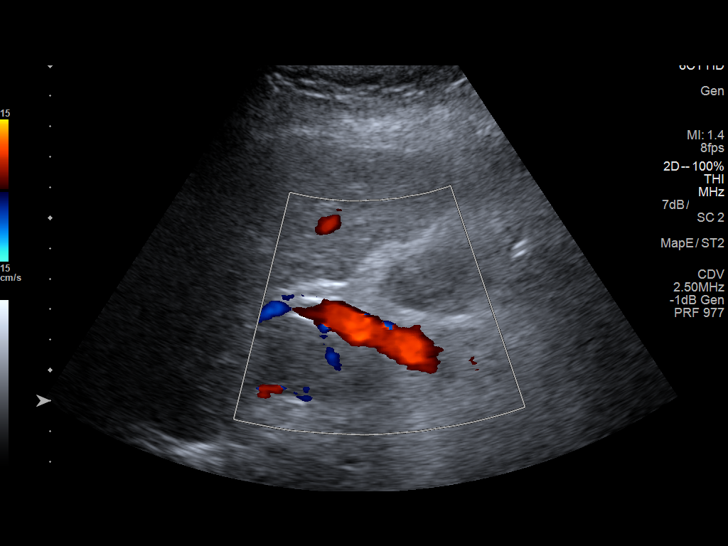

[14 of 25 positions shown; findings below may reference images not displayed]

FINDINGS: Gallbladder:

Mobile echogenic focus with posterior acoustic shadowing consistent
with cholelithiasis. Per the sonographer, there was no sonographic
Murphy sign. No evidence of pericholecystic fluid. No evidence of
gallbladder wall thickening.

Common bile duct:

Diameter: Normal at 5 mm

Liver:

Nodular hepatic contour and diffusely heterogeneous hepatic
parenchyma consistent with the clinical history of cirrhosis. No
discrete hepatic lesion is identified. No evidence of intrahepatic
biliary ductal dilatation. Portal vein is patent on color Doppler
imaging with normal direction of blood flow towards the liver.
IMPRESSION: 1. Hepatic cirrhosis without discrete hepatic lesion.
2. Cholelithiasis.

## 2020-06-01 ENCOUNTER — Telehealth: Payer: Self-pay | Admitting: Gastroenterology

## 2020-06-01 NOTE — Telephone Encounter (Signed)
Patient called in reference to a bill that she is receiving for hep shots along with another visit please advise.

## 2020-06-01 NOTE — Telephone Encounter (Signed)
Patient's insurance will only pay for her vaccines at a pharmacy.  She is due for her 3rd shot in October.  She will call her insurance company and see if they will pay for Twinrix at the pharmacy or will she need two rx one for Hep A and one for Hep B

## 2020-06-08 IMAGING — US US ABDOMEN LIMITED
1 series · 14 of 25 positions shown · non-contrast
Comparison: 07/23/2018

CLINICAL DATA: Follow-up cirrhosis

EXAM:
ULTRASOUND ABDOMEN LIMITED RIGHT UPPER QUADRANT

[Series 1: us abdomen limited · 14 of 46 slices shown]
[im 1/46]
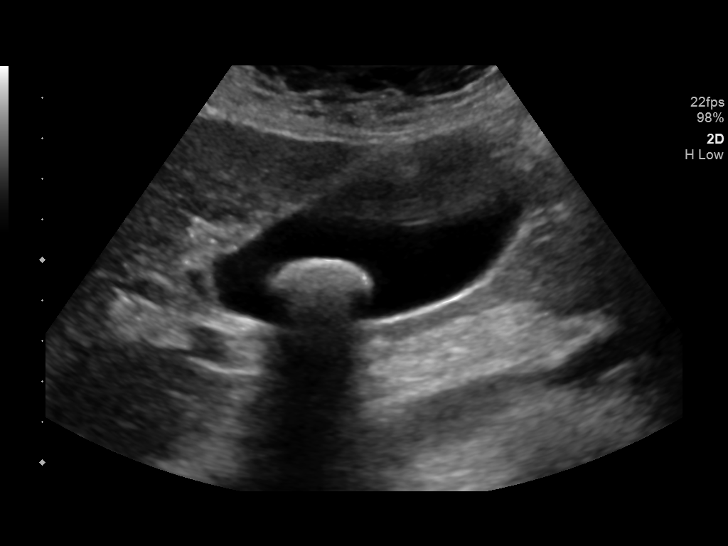
[im 4/46]
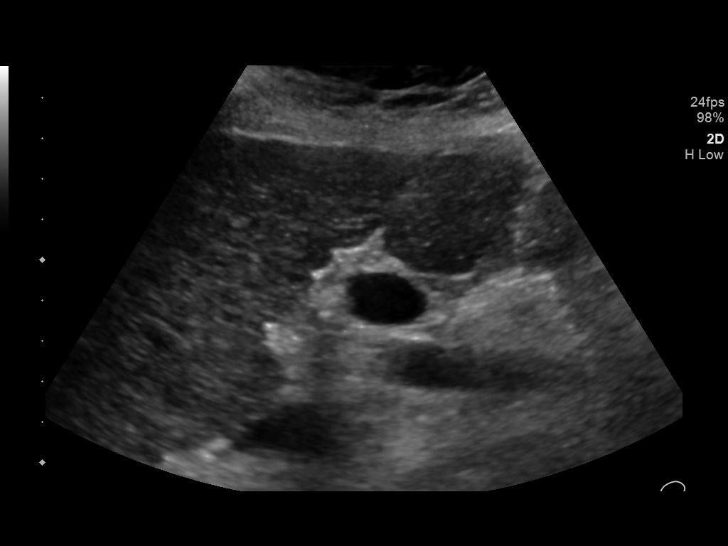
[im 8/46]
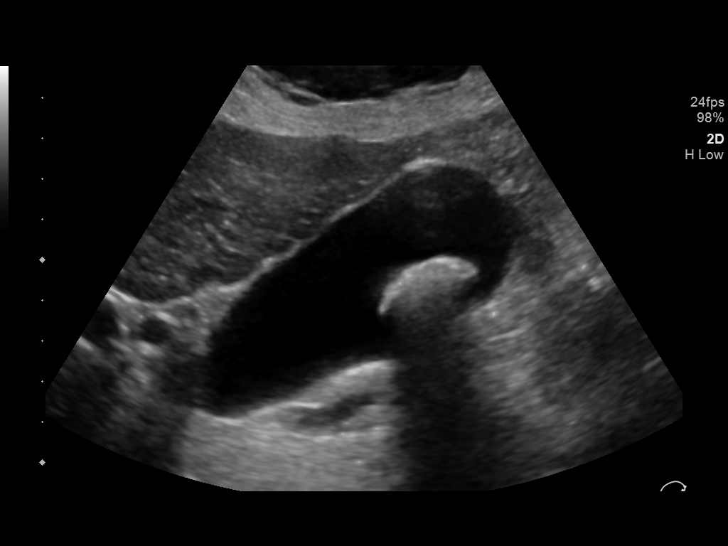
[im 12/46]
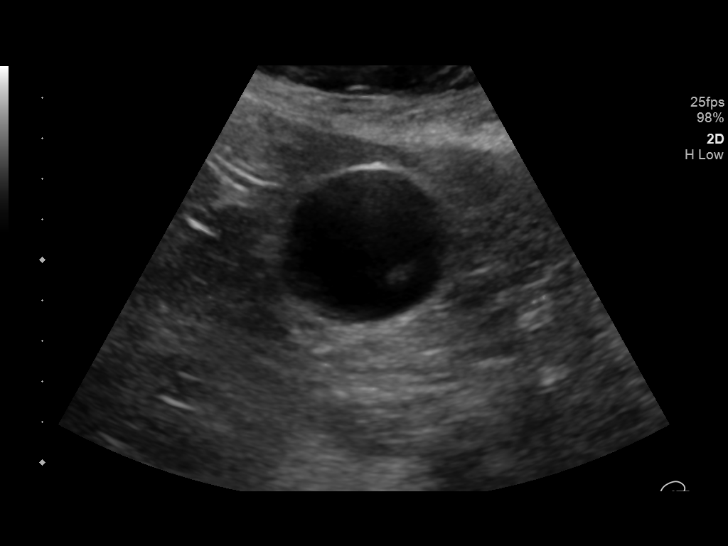
[im 16/46]
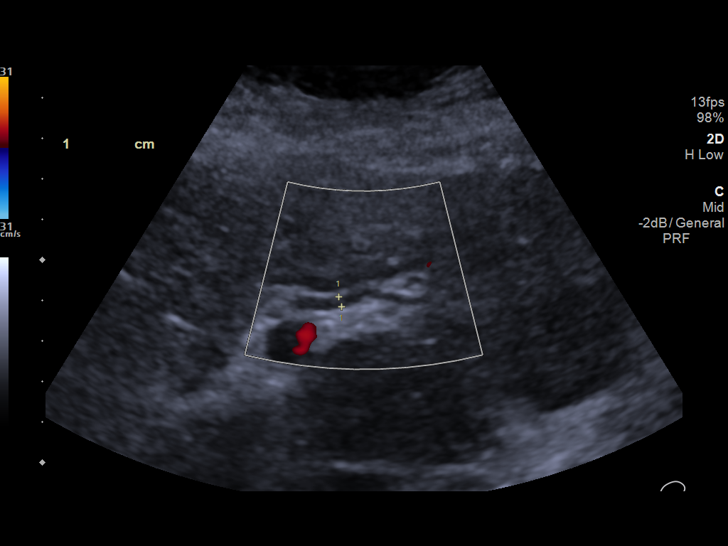
[im 17/46]
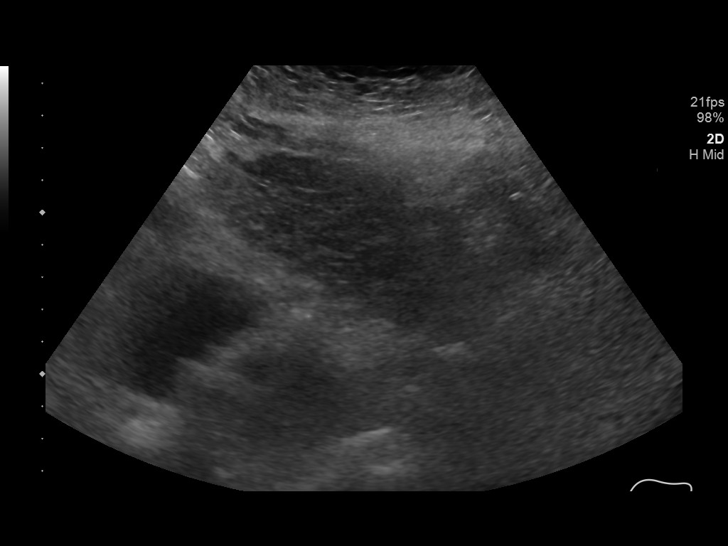
[im 21/46]
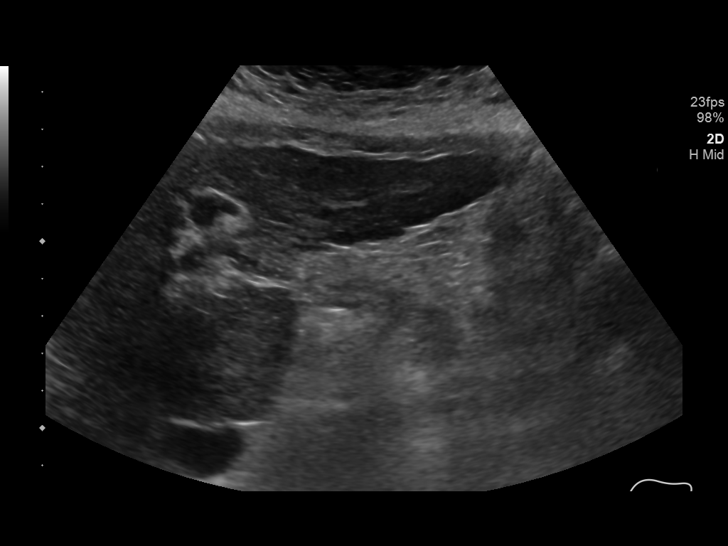
[im 25/46]
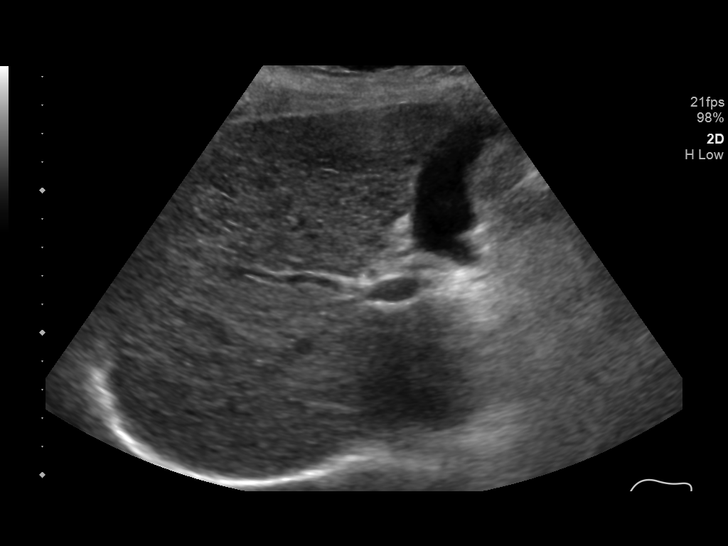
[im 29/46]
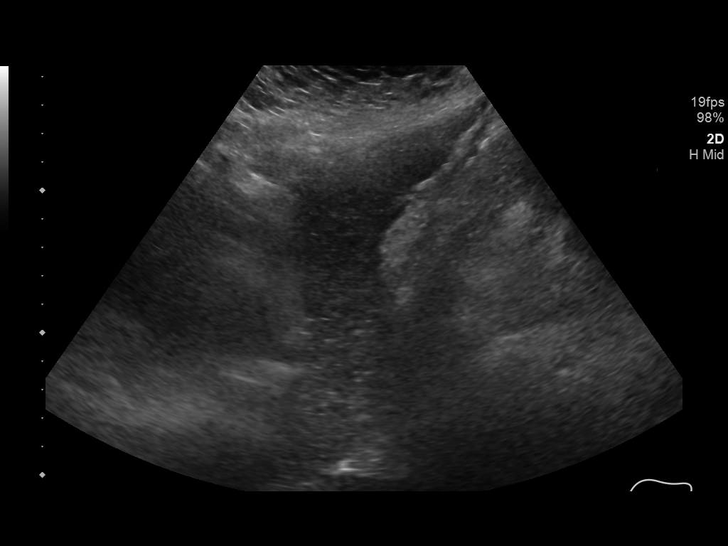
[im 31/46]
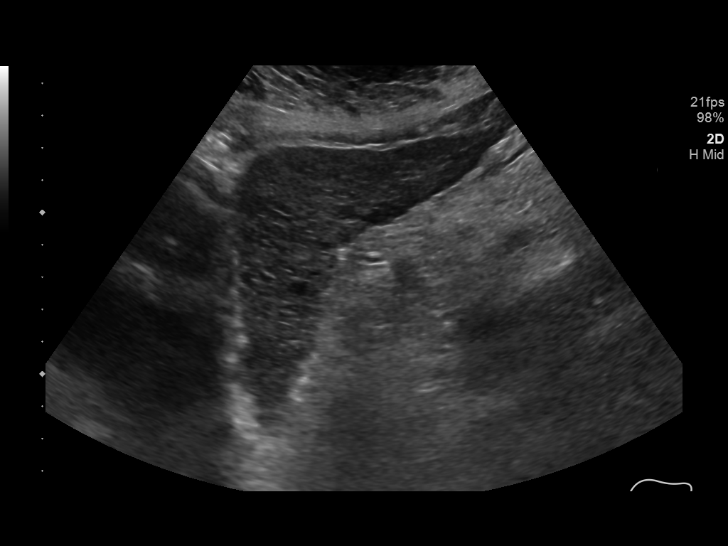
[im 34/46]
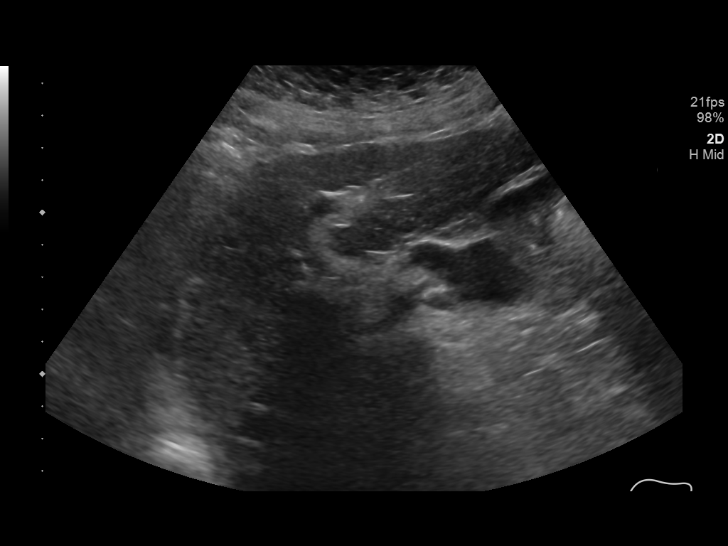
[im 38/46]
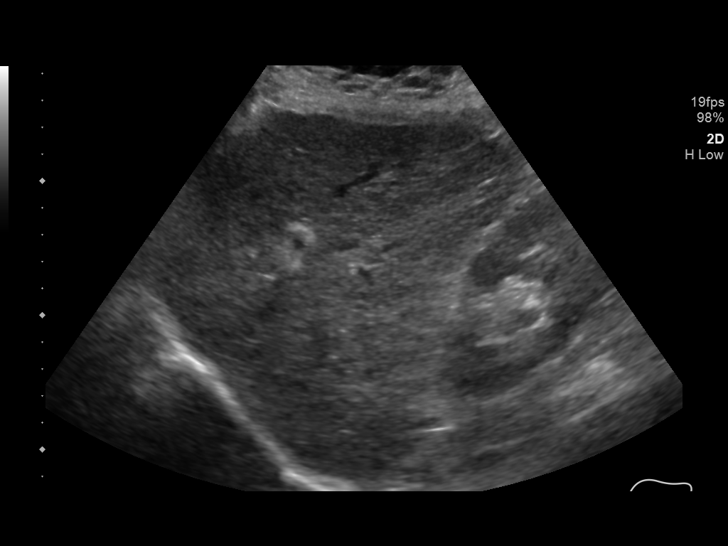
[im 42/46]
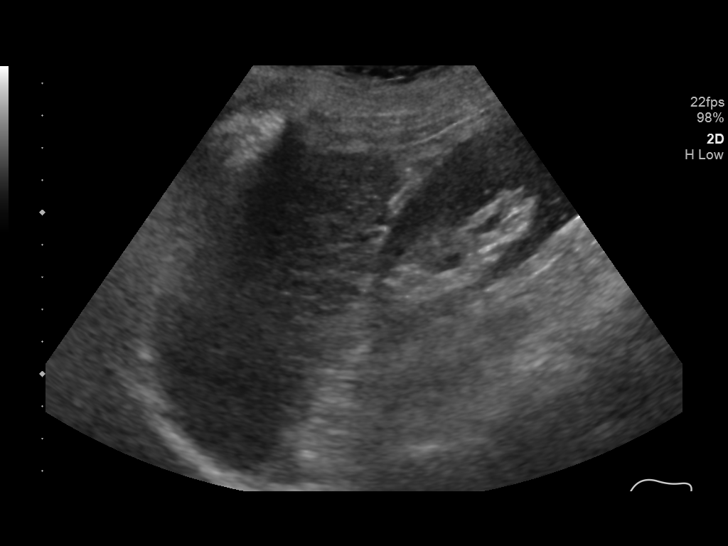
[im 46/46]
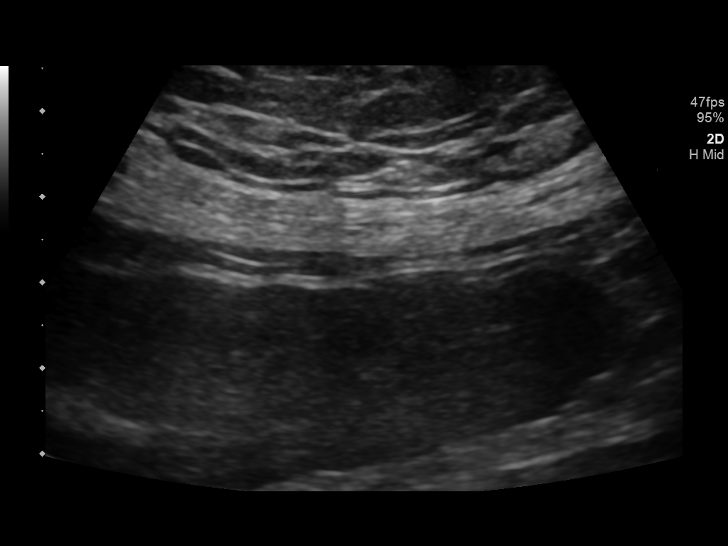

[14 of 25 positions shown; findings below may reference images not displayed]

FINDINGS: Gallbladder:

Gallbladder is well distended with large gallstone within. No wall
thickening or pericholecystic fluid is noted.

Common bile duct:

Diameter: 5.1 mm.

Liver:

Heterogeneous echotexture of the liver is noted with mild nodularity
consistent with underlying cirrhosis. The overall appearance is
stable from the prior exam. No new focal abnormality is noted.
Portal vein is patent on color Doppler imaging with normal direction
of blood flow towards the liver.
IMPRESSION: Stable cirrhosis and cholelithiasis.  No acute abnormality noted.

## 2020-06-17 DIAGNOSIS — Z85828 Personal history of other malignant neoplasm of skin: Secondary | ICD-10-CM | POA: Diagnosis not present

## 2020-06-17 DIAGNOSIS — L821 Other seborrheic keratosis: Secondary | ICD-10-CM | POA: Diagnosis not present

## 2020-06-17 DIAGNOSIS — L578 Other skin changes due to chronic exposure to nonionizing radiation: Secondary | ICD-10-CM | POA: Diagnosis not present

## 2020-06-18 ENCOUNTER — Other Ambulatory Visit: Payer: Self-pay | Admitting: Family Medicine

## 2020-07-02 ENCOUNTER — Other Ambulatory Visit: Payer: Self-pay | Admitting: Family Medicine

## 2020-07-28 ENCOUNTER — Other Ambulatory Visit: Payer: Self-pay | Admitting: Family Medicine

## 2020-07-28 DIAGNOSIS — R69 Illness, unspecified: Secondary | ICD-10-CM | POA: Diagnosis not present

## 2020-08-05 ENCOUNTER — Telehealth: Payer: Self-pay | Admitting: Family Medicine

## 2020-08-05 NOTE — Telephone Encounter (Signed)
Caller: Ranyah  Call Back # [phone]  Patient states she would like a TOC from [place] to [facility].  Please Advise

## 2020-08-05 NOTE — Telephone Encounter (Signed)
OK w me.  

## 2020-08-06 NOTE — Telephone Encounter (Signed)
Called pt and scheduled her for a TOC/NP appointment.

## 2020-08-06 NOTE — Telephone Encounter (Signed)
[place] with me. Please schedule TOC/NP  appt

## 2020-08-26 ENCOUNTER — Encounter: Payer: Self-pay | Admitting: Family Medicine

## 2020-08-26 ENCOUNTER — Other Ambulatory Visit: Payer: Self-pay

## 2020-08-26 ENCOUNTER — Ambulatory Visit (INDEPENDENT_AMBULATORY_CARE_PROVIDER_SITE_OTHER): Payer: Medicare HMO | Admitting: Family Medicine

## 2020-08-26 VITALS — BP 144/72 | HR 91 | Temp 97.8°F | Ht 62.0 in | Wt 222.0 lb

## 2020-08-26 DIAGNOSIS — Z1231 Encounter for screening mammogram for malignant neoplasm of breast: Secondary | ICD-10-CM

## 2020-08-26 DIAGNOSIS — M7989 Other specified soft tissue disorders: Secondary | ICD-10-CM | POA: Diagnosis not present

## 2020-08-26 DIAGNOSIS — I10 Essential (primary) hypertension: Secondary | ICD-10-CM

## 2020-08-26 DIAGNOSIS — E119 Type 2 diabetes mellitus without complications: Secondary | ICD-10-CM | POA: Diagnosis not present

## 2020-08-26 DIAGNOSIS — E782 Mixed hyperlipidemia: Secondary | ICD-10-CM | POA: Diagnosis not present

## 2020-08-26 DIAGNOSIS — G6289 Other specified polyneuropathies: Secondary | ICD-10-CM | POA: Diagnosis not present

## 2020-08-26 LAB — POCT GLYCOSYLATED HEMOGLOBIN (HGB A1C)
HbA1c POC (<> result, manual entry): 7.6 % (ref 4.0–5.6)
HbA1c, POC (controlled diabetic range): 7.6 % — AB (ref 0.0–7.0)
HbA1c, POC (prediabetic range): 7.6 % — AB (ref 5.7–6.4)
Hemoglobin A1C: 7.6 % — AB (ref 4.0–5.6)

## 2020-08-26 MED ORDER — FUROSEMIDE 20 MG PO TABS: 10.0000 mg | ORAL_TABLET | Freq: Every day | ORAL | 3 refills | Status: DC | PRN

## 2020-08-26 MED ORDER — LISINOPRIL 5 MG PO TABS: 5.0000 mg | ORAL_TABLET | Freq: Every day | ORAL | 1 refills | Status: DC

## 2020-08-26 MED ORDER — RYBELSUS 3 MG PO TABS: 1.0000 | ORAL_TABLET | Freq: Every day | ORAL | 0 refills | Status: DC

## 2020-08-26 MED ORDER — ROSUVASTATIN CALCIUM 5 MG PO TABS: 5.0000 mg | ORAL_TABLET | ORAL | 3 refills | Status: AC

## 2020-08-26 MED ORDER — METFORMIN HCL 1000 MG PO TABS: 1000.0000 mg | ORAL_TABLET | Freq: Two times a day (BID) | ORAL | 1 refills | Status: DC

## 2020-08-26 NOTE — Progress Notes (Signed)
Patient ID: Stacey Cooper, female  DOB: July 05, 1935, 85 y.o.   MRN: [id number] Patient Care Team    Relationship Specialty Notifications Start End  Ma Hillock, DO PCP - General Family Medicine  08/26/20   Monna Fam, MD Consulting Physician Ophthalmology  08/08/16   Melrose Nakayama, MD Consulting Physician Orthopedic Surgery  08/08/16   Ladene Artist, MD Consulting Physician Gastroenterology  03/13/20   Sydnee Levans, MD Referring Physician Dermatology  03/13/20     Chief Complaint  Patient presents with  . New Patient (Initial Visit)    blood sugar concerns    Subjective:  Stacey Cooper is a 85 y.o.  female present for TOC All past medical history, surgical history, allergies, family history, immunizations, medications and social history were updated in the electronic medical record today. All recent labs, ED visits and hospitalizations within the last year were reviewed.  Diabetes type 2: Pt reports compliance with Metformin total daily dose of 2500 mg. Patient reports she does have chronic peripheral neuropathy. Denies worsening numbness, tingling of extremities, hypo/hyperglycemic events or non-healing wounds. Pt reports fasting blood glucoses have been around 180s. Her A1c 03/31/2020 was 7.5. She has been on other medications in the past including Janumet, Amaryl, Victoza and Actos. PNA series: Completed Flu shot: Due for this season-patient would like to wait until October (recommneded yearly) BMP: UTD 03/31/2020 with normal kidney function. Foot exam: Completed 03/31/2020 Eye exam: Completed 02/20/2020 with retinopathy in the past, however most recent eye exam reported no retinopathy. A1c: 7.5> 7.6 today.  Hypertension: Pt reports compliance with lisinopril 5 mg daily and Crestor 5 mg every other day. Blood pressures ranges at home normal limits. Patient denies chest pain, shortness of breath. Patient does endorse having occasional lower extremity edema. She  states she used to be on Lasix as needed.  Pt is  prescribed statin.   Depression screen [facility] 2/9 03/13/2020 05/08/2018 05/15/2017 08/08/2016 02/19/2016  Decreased Interest 0 0 1 0 0  Down, Depressed, Hopeless 0 0 1 0 0  PHQ - 2 Score 0 0 2 0 0  Altered sleeping - - 1 - -  Tired, decreased energy - - 2 - -  Change in appetite - - 0 - -  Feeling bad or failure about yourself  - - 1 - -  Trouble concentrating - - 1 - -  Moving slowly or fidgety/restless - - 1 - -  Suicidal thoughts - - 0 - -  PHQ-9 Score - - 8 - -   No flowsheet data found.        Fall Risk  03/13/2020 05/08/2018 08/08/2016 02/19/2016 01/23/2015  Falls in the past year? 1 No Yes No No  Number falls in past yr: 1 - 1 - -  Injury with Fall? 0 - No - -  Risk for fall due to : - - History of fall(s) - -  Follow up Education provided;Falls prevention discussed - Falls prevention discussed - -     Immunization History  Administered Date(s) Administered  . Fluad Quad(high Dose 65+) 10/10/2019  . Hep A / Hep B 04/20/2020, 05/21/2020  . Influenza Split 10/20/2011, 11/29/2012  . Influenza Whole 08/26/2010  . Influenza, High Dose Seasonal PF 08/23/2016, 10/09/2018  . Influenza,inj,Quad PF,6+ Mos 09/05/2013, 08/28/2014, 11/12/2015  . PFIZER SARS-COV-2 Vaccination 02/06/2020, 03/02/2020  . Pneumococcal Conjugate-13 08/28/2014  . Pneumococcal Polysaccharide-23 11/12/2015  . Tdap 12/06/2018    No exam data present  Past Medical  History:  Diagnosis Date  . Actinic keratoses 07/18/2011  . Alopecia 01/24/2011   Qualifier: Diagnosis of  By: [name], [name]    . Anterior dislocation of right shoulder   . Arthralgia 05/03/2015  . Back pain 12/08/2011  . Basal cell carcinoma of skin of lip 01/24/2011  . Blood transfusion without reported diagnosis   . Cataract    bilataeral cateracts removed  . CHOLELITHIASIS, HX OF 02/07/2011  . Chronic kidney disease   . Diabetes mellitus type 2 in obese [facility]) 01/24/2011   Qualifier: Diagnosis  of  By: [name], [name]  Annual eye exam with [facility] on 06/14/13   . Essential hypertension, benign 01/24/2011  . Fatty liver   . GERD 01/24/2011  . Grief reaction 01/20/2014  . HAIR LOSS 01/24/2011  . Hearing loss of both ears 06/28/2013  . Hepatic cirrhosis ([place]) 04/27/2015  . Hyperlipidemia, mixed 04/24/2012  . Insomnia 07/24/2012  . Joint pain   . MEASLES, HX OF 01/24/2011  . Morbid obesity ([place]) 01/24/2011  . NONSPEC ELEVATION OF LEVELS OF TRANSAMINASE/LDH 02/07/2011  . Peripheral neuropathy 04/20/2011  . Right hip pain 05/03/2015  . SCC (squamous cell carcinoma), face 07/18/2011  . SQUAMOUS CELL CARCINOMA OTHER SPEC SITES SKIN 01/24/2011  . Swelling   . Tubular adenoma of colon 04/2015  . Umbilical hernia 09/11/6059  . URINARY INCONTINENCE 02/07/2011  . UTI'S, HX OF 01/24/2011  . Vulvar atrophy 03/17/2017   Allergies  Allergen Reactions  . Contrast Media [Iodinated Diagnostic Agents] Shortness Of Breath  . Doxycycline     Severe oral ulcers.  . Lovastatin     myalgias   Past Surgical History:  Procedure Laterality Date  . ABDOMINAL HYSTERECTOMY     partial, both ovaries left in place  . BCC removed below lower lip on left  26 yrs ago  . cataract extraction, b/l    . COLONOSCOPY    . Left knee arthroscopy and cleaning prior to TKR    . SHOULDER CLOSED REDUCTION Right 07/14/2018   Procedure: CLOSED REDUCTION SHOULDER, RIGHT;  Surgeon: Leandrew Koyanagi, MD;  Location: [place];  Service: Orthopedics;  Laterality: Right;  . skin bx, right anterior CW , squamous cancer, had to redo to margins  12/2010  . total knee replacement, b/l     Family History  Problem Relation Age of Onset  . Arthritis Mother        RA  . Myelodysplastic syndrome Mother   . Heart disease Mother   . Cancer Father        skin  . Stroke Father   . Colon polyps Father        62 in [place] intes removed carcinoid tumor confined in the tumor  . Stroke Maternal Grandmother   . Kidney failure Maternal  Grandfather   . Esophageal cancer Neg Hx   . Rectal cancer Neg Hx   . Stomach cancer Neg Hx    Social History   Social History Narrative  . Not on file    Allergies as of 08/26/2020      Reactions   Contrast Media [iodinated Diagnostic Agents] Shortness Of Breath   Doxycycline    Severe oral ulcers.   Lovastatin    myalgias      Medication List       Accurate as of August 26, 2020 11:59 PM. If you have any questions, ask your nurse or doctor.        fluorouracil 5 % cream  Commonly known as: EFUDEX Apply topically daily. Use for two weeks   furosemide 20 MG tablet Commonly known as: LASIX Take 0.5 tablets (10 mg total) by mouth daily as needed. Started by: Howard Pouch, DO   Lancets Misc Check blood sugars qam and once daily prn   lisinopril 5 MG tablet Commonly known as: ZESTRIL Take 1 tablet (5 mg total) by mouth daily.   metFORMIN 1000 MG tablet Commonly known as: GLUCOPHAGE Take 1 tablet (1,000 mg total) by mouth 2 (two) times daily with a meal. DC PRIOR script please. What changed:   medication strength  See the new instructions. Changed by: Howard Pouch, DO   OneTouch Ultra test strip Generic drug: glucose blood USE TO TEST BLOOD SUGAR TWICE A DAY AS DIRECTED   Prodigy Autocode Blood Glucose w/Device Kit 1 Device by Does not apply route 3 (three) times daily between meals as needed.   rosuvastatin 5 MG tablet Commonly known as: CRESTOR Take 1 tablet (5 mg total) by mouth every other day.   Rybelsus 3 MG Tabs Generic drug: Semaglutide Take 1 tablet by mouth daily. Started by: Howard Pouch, DO       All past medical history, surgical history, allergies, family history, immunizations andmedications were updated in the EMR today and reviewed under the history and medication portions of their EMR.    Recent Results (from the past 2160 hour(s))  POCT HgB A1C     Status: Abnormal   Collection Time: 08/26/20  8:17 AM  Result Value Ref Range    Hemoglobin A1C 7.6 (A) 4.0 - 5.6 %   HbA1c POC (<> result, manual entry) 7.6 4.0 - 5.6 %   HbA1c, POC (prediabetic range) 7.6 (A) 5.7 - 6.4 %   HbA1c, POC (controlled diabetic range) 7.6 (A) 0.0 - 7.0 %      ROS: 14 pt review of systems performed and negative (unless mentioned in an HPI)  Objective: BP (!) 144/72 (BP Location: Left Arm, Patient Position: Sitting, Cuff Size: Large)   Pulse 91   Temp 97.8 F (36.6 C) (Oral)   Ht _0  (1.575 m)   Wt 222 lb (100.7 kg)   SpO2 96%   BMI 40.60 kg/m  Gen: Afebrile. No acute distress. Nontoxic in appearance, well-developed, well-nourished, very pleasant, obese female. HENT: AT. [place].  Eyes:Pupils Equal Round Reactive to light, Extraocular movements intact,  Conjunctiva without redness, discharge or icterus. Neck/lymp/endocrine: Supple, no lymphadenopathy, no thyromegaly CV: RRR no murmur, no edema, +2/4 P posterior tibialis pulses. No JVD. Chest: CTAB, no wheeze, rhonchi or crackles. Normal respiratory effort. Good air movement. Abd: Soft. Obese. NTND. BS present. . Skin: No rashes, purpura or petechiae. Warm and well-perfused. Skin intact. Neuro/Msk:  Normal gait. PERLA. EOMi. Alert. Oriented x3.   Psych: Normal affect, dress and demeanor. Normal speech. Normal thought content and judgment.   Assessment/plan: LANEYA GASAWAY is a 84 y.o. female present for TOC Type 2 diabetes mellitus without complication, without long-term current use of insulin (HCC)/neuropathy A1c 7.6 today. Discussed options for treatment with her today. -Lower Metformin dose to 1000 mg twice daily -Start Rybelsus 3 mg daily - POCT HgB A1C  Essential hypertension, benign/hyperlipidemia/morbid obesity/intermittent lower extremity edema Home pressures have been stable. She states she was little nervous to meet a new primary care provider today. She will continue to monitor at home with a goal of less than 130/80 Continue lisinopril 5 mg daily. Added back Lasix 10  mg daily as needed  for edema Continue Crestor every other day  Breast cancer screening by mammogram She is due for her mammogram. Placed order for her today so it is in the proper PCP name. - MM 3D SCREEN BREAST BILATERAL; Future    Return in about 3 months (around 11/25/2020).  Orders Placed This Encounter  Procedures  . MM 3D SCREEN BREAST BILATERAL  . POCT HgB A1C   Meds ordered this encounter  Medications  . metFORMIN (GLUCOPHAGE) 1000 MG tablet    Sig: Take 1 tablet (1,000 mg total) by mouth 2 (two) times daily with a meal. DC PRIOR script please.    Dispense:  180 tablet    Refill:  1    Remind her to only take one tab BID (dose per pill is now 1000)  . Semaglutide (RYBELSUS) 3 MG TABS    Sig: Take 1 tablet by mouth daily.    Dispense:  90 tablet    Refill:  0  . lisinopril (ZESTRIL) 5 MG tablet    Sig: Take 1 tablet (5 mg total) by mouth daily.    Dispense:  90 tablet    Refill:  1    DC prior scripts.  . furosemide (LASIX) 20 MG tablet    Sig: Take 0.5 tablets (10 mg total) by mouth daily as needed.    Dispense:  30 tablet    Refill:  3  . rosuvastatin (CRESTOR) 5 MG tablet    Sig: Take 1 tablet (5 mg total) by mouth every other day.    Dispense:  45 tablet    Refill:  3    DC prior scripts please,new provider.   Referral Orders  No referral(s) requested today     Note is dictated utilizing voice recognition software. Although note has been proof read prior to signing, occasional typographical errors still can be missed. If any questions arise, please do not hesitate to call for verification.  Electronically signed by: Howard Pouch, DO [place]

## 2020-08-26 NOTE — Patient Instructions (Addendum)
Your a1c was 7.6 today Decrease metformin to 1000 mg twice a day.  Added Rybelsus start 1 tab daily.  Lasix water pill prescribed- you can take 1/2 tab up to twice a week if need to for fluid. Only use if needed.   I refilled your other meds as well.   Next appt in 3 mos.     Diabetes Mellitus and Nutrition, Adult When you have diabetes (diabetes mellitus), it is very important to have healthy eating habits because your blood sugar (glucose) levels are greatly affected by what you eat and drink. Eating healthy foods in the appropriate amounts, at about the same times every day, can help you:  Control your blood glucose.  Lower your risk of heart disease.  Improve your blood pressure.  Reach or maintain a healthy weight. Every person with diabetes is different, and each person has different needs for a meal plan. Your health care provider may recommend that you work with a diet and nutrition specialist (dietitian) to make a meal plan that is best for you. Your meal plan may vary depending on factors such as:  The calories you need.  The medicines you take.  Your weight.  Your blood glucose, blood pressure, and cholesterol levels.  Your activity level.  Other health conditions you have, such as heart or kidney disease. How do carbohydrates affect me? Carbohydrates, also called carbs, affect your blood glucose level more than any other type of food. Eating carbs naturally raises the amount of glucose in your blood. Carb counting is a method for keeping track of how many carbs you eat. Counting carbs is important to keep your blood glucose at a healthy level, especially if you use insulin or take certain oral diabetes medicines. It is important to know how many carbs you can safely have in each meal. This is different for every person. Your dietitian can help you calculate how many carbs you should have at each meal and for each snack. Foods that contain carbs include:  Bread,  cereal, rice, pasta, and crackers.  Potatoes and corn.  Peas, beans, and lentils.  Milk and yogurt.  Fruit and juice.  Desserts, such as cakes, cookies, ice cream, and candy. How does alcohol affect me? Alcohol can cause a sudden decrease in blood glucose (hypoglycemia), especially if you use insulin or take certain oral diabetes medicines. Hypoglycemia can be a life-threatening condition. Symptoms of hypoglycemia (sleepiness, dizziness, and confusion) are similar to symptoms of having too much alcohol. If your health care provider says that alcohol is safe for you, follow these guidelines:  Limit alcohol intake to no more than 1 drink per day for nonpregnant women and 2 drinks per day for men. One drink equals 12 oz of beer, 5 oz of wine, or 1 oz of hard liquor.  Do not drink on an empty stomach.  Keep yourself hydrated with water, diet soda, or unsweetened iced tea.  Keep in mind that regular soda, juice, and other mixers may contain a lot of sugar and must be counted as carbs. What are tips for following this plan?  Reading food labels  Start by checking the serving size on the "Nutrition Facts" label of packaged foods and drinks. The amount of calories, carbs, fats, and other nutrients listed on the label is based on one serving of the item. Many items contain more than one serving per package.  Check the total grams (g) of carbs in one serving. You can calculate the number of  servings of carbs in one serving by dividing the total carbs by 15. For example, if a food has 30 g of total carbs, it would be equal to 2 servings of carbs.  Check the number of grams (g) of saturated and trans fats in one serving. Choose foods that have low or no amount of these fats.  Check the number of milligrams (mg) of salt (sodium) in one serving. Most people should limit total sodium intake to less than 2,300 mg per day.  Always check the nutrition information of foods labeled as "low-fat" or  "nonfat". These foods may be higher in added sugar or refined carbs and should be avoided.  Talk to your dietitian to identify your daily goals for nutrients listed on the label. Shopping  Avoid buying canned, premade, or processed foods. These foods tend to be high in fat, sodium, and added sugar.  Shop around the outside edge of the grocery store. This includes fresh fruits and vegetables, bulk grains, fresh meats, and fresh dairy. Cooking  Use low-heat cooking methods, such as baking, instead of high-heat cooking methods like deep frying.  Cook using healthy oils, such as olive, canola, or sunflower oil.  Avoid cooking with butter, cream, or high-fat meats. Meal planning  Eat meals and snacks regularly, preferably at the same times every day. Avoid going long periods of time without eating.  Eat foods high in fiber, such as fresh fruits, vegetables, beans, and whole grains. Talk to your dietitian about how many servings of carbs you can eat at each meal.  Eat 4-6 ounces (oz) of lean protein each day, such as lean meat, chicken, fish, eggs, or tofu. One oz of lean protein is equal to: ? 1 oz of meat, chicken, or fish. ? 1 egg. ?  cup of tofu.  Eat some foods each day that contain healthy fats, such as avocado, nuts, seeds, and fish. Lifestyle  Check your blood glucose regularly.  Exercise regularly as told by your health care provider. This may include: ? 150 minutes of moderate-intensity or vigorous-intensity exercise each week. This could be brisk walking, biking, or water aerobics. ? Stretching and doing strength exercises, such as yoga or weightlifting, at least 2 times a week.  Take medicines as told by your health care provider.  Do not use any products that contain nicotine or tobacco, such as cigarettes and e-cigarettes. If you need help quitting, ask your health care provider.  Work with a Social worker or diabetes educator to identify strategies to manage stress and  any emotional and social challenges. Questions to ask a health care provider  Do I need to meet with a diabetes educator?  Do I need to meet with a dietitian?  What number can I call if I have questions?  When are the best times to check my blood glucose? Where to find more information:  American Diabetes Association: diabetes.org  Academy of Nutrition and Dietetics: www.eatright.[employer] of Diabetes and Digestive and Kidney Diseases (NIH): [employer] Summary  A healthy meal plan will help you control your blood glucose and maintain a healthy lifestyle.  Working with a diet and nutrition specialist (dietitian) can help you make a meal plan that is best for you.  Keep in mind that carbohydrates (carbs) and alcohol have immediate effects on your blood glucose levels. It is important to count carbs and to use alcohol carefully. This information is not intended to replace advice given to you by your health care provider. Make  sure you discuss any questions you have with your health care provider. Document Revised: 11/24/2017 Document Reviewed: 01/16/2017 Elsevier Patient Education  2020 [employer].

## 2020-08-27 ENCOUNTER — Encounter: Payer: Self-pay | Admitting: Family Medicine

## 2020-08-27 ENCOUNTER — Telehealth: Payer: Self-pay | Admitting: Gastroenterology

## 2020-08-27 DIAGNOSIS — K7460 Unspecified cirrhosis of liver: Secondary | ICD-10-CM

## 2020-08-27 DIAGNOSIS — Z1231 Encounter for screening mammogram for malignant neoplasm of breast: Secondary | ICD-10-CM | POA: Diagnosis not present

## 2020-08-27 LAB — HM MAMMOGRAPHY

## 2020-08-27 NOTE — Telephone Encounter (Signed)
Pt is requesting a call back from a nurse to schedule her US. 

## 2020-08-28 NOTE — Telephone Encounter (Signed)
Patient has been scheduled for 09/14/20 8:00 at [facility].  7:30 arrival. She will need to be NPO after midnight. She is also due for labs .  Left message for patient to call back

## 2020-09-02 NOTE — Telephone Encounter (Signed)
Patient notified of the appt date and time.  She will come for labs after the [name]

## 2020-09-07 ENCOUNTER — Telehealth: Payer: Self-pay | Admitting: Family Medicine

## 2020-09-07 NOTE — Telephone Encounter (Signed)
Pt aware of results 

## 2020-09-07 NOTE — Telephone Encounter (Signed)
Please inform patient her mammogram is normal.  

## 2020-09-14 ENCOUNTER — Other Ambulatory Visit: Payer: Self-pay

## 2020-09-14 ENCOUNTER — Telehealth: Payer: Self-pay

## 2020-09-14 ENCOUNTER — Ambulatory Visit (HOSPITAL_COMMUNITY)
Admission: RE | Admit: 2020-09-14 | Discharge: 2020-09-14 | Disposition: A | Discharge status: Home / Self Care | Payer: Medicare HMO | Source: Ambulatory Visit | Attending: Gastroenterology | Admitting: Gastroenterology

## 2020-09-14 ENCOUNTER — Other Ambulatory Visit (INDEPENDENT_AMBULATORY_CARE_PROVIDER_SITE_OTHER): Payer: Medicare HMO

## 2020-09-14 DIAGNOSIS — K7460 Unspecified cirrhosis of liver: Secondary | ICD-10-CM | POA: Insufficient documentation

## 2020-09-14 DIAGNOSIS — K8020 Calculus of gallbladder without cholecystitis without obstruction: Secondary | ICD-10-CM | POA: Diagnosis not present

## 2020-09-14 DIAGNOSIS — Z23 Encounter for immunization: Secondary | ICD-10-CM

## 2020-09-14 LAB — COMPREHENSIVE METABOLIC PANEL
ALT: 13 U/L (ref 0–35)
AST: 24 U/L (ref 0–37)
Albumin: 4.1 g/dL (ref 3.5–5.2)
Alkaline Phosphatase: 108 U/L (ref 39–117)
BUN: 14 mg/dL (ref 6–23)
CO2: 25 mEq/L (ref 19–32)
Calcium: 9.8 mg/dL (ref 8.4–10.5)
Chloride: 104 mEq/L (ref 96–112)
Creatinine, Ser: 0.82 mg/dL (ref 0.40–1.20)
GFR: 66.57 mL/min (ref >60.00)
Glucose, Bld: 153 mg/dL — ABNORMAL HIGH (ref 70–99)
Potassium: 4 mEq/L (ref 3.5–5.1)
Sodium: 141 mEq/L (ref 135–145)
Total Bilirubin: 0.7 mg/dL (ref 0.2–1.2)
Total Protein: 7.3 g/dL (ref 6.0–8.3)

## 2020-09-14 LAB — CBC
HCT: 39.8 % (ref 36.0–46.0)
Hemoglobin: 13.4 g/dL (ref 12.0–15.0)
MCHC: 33.8 g/dL (ref 30.0–36.0)
MCV: 93.7 fl (ref 78.0–100.0)
Platelets: 205 10*3/uL (ref 150.0–400.0)
RBC: 4.25 Mil/uL (ref 3.87–5.11)
RDW: 13.5 % (ref 11.5–15.5)
WBC: 7.6 10*3/uL (ref 4.0–10.5)

## 2020-09-14 LAB — PROTIME-INR
INR: 1.1 ratio — ABNORMAL HIGH (ref 0.8–1.0)
Prothrombin Time: 11.9 s (ref 9.6–13.1)

## 2020-09-14 MED ORDER — TWINRIX 720-20 ELU-MCG/ML IM SUSY: 1.0000 mL | PREFILLED_SYRINGE | Freq: Once | INTRAMUSCULAR | 0 refills | Status: AC

## 2020-09-14 NOTE — Telephone Encounter (Signed)
Patient's insurance will not pay for her to come for Twinrix here.  $284.00.  If she goes to a pharmacy is $47.  I mailed her an rx to get her 3rd and final Twinrix at the local pharmacy.

## 2020-09-28 ENCOUNTER — Ambulatory Visit: Payer: Medicare HMO | Admitting: Family Medicine

## 2020-10-24 DIAGNOSIS — R69 Illness, unspecified: Secondary | ICD-10-CM | POA: Diagnosis not present

## 2020-11-25 ENCOUNTER — Telehealth: Payer: Self-pay

## 2020-11-25 ENCOUNTER — Ambulatory Visit (INDEPENDENT_AMBULATORY_CARE_PROVIDER_SITE_OTHER): Payer: Medicare HMO | Admitting: Family Medicine

## 2020-11-25 ENCOUNTER — Other Ambulatory Visit: Payer: Self-pay

## 2020-11-25 ENCOUNTER — Encounter: Payer: Self-pay | Admitting: Family Medicine

## 2020-11-25 VITALS — BP 115/71 | HR 84 | Temp 97.5°F | Ht 62.0 in | Wt 212.0 lb

## 2020-11-25 DIAGNOSIS — I10 Essential (primary) hypertension: Secondary | ICD-10-CM

## 2020-11-25 DIAGNOSIS — Z23 Encounter for immunization: Secondary | ICD-10-CM | POA: Diagnosis not present

## 2020-11-25 DIAGNOSIS — E119 Type 2 diabetes mellitus without complications: Secondary | ICD-10-CM

## 2020-11-25 DIAGNOSIS — E782 Mixed hyperlipidemia: Secondary | ICD-10-CM

## 2020-11-25 LAB — POCT GLYCOSYLATED HEMOGLOBIN (HGB A1C)
HbA1c POC (<> result, manual entry): 7.1 % (ref 4.0–5.6)
HbA1c, POC (controlled diabetic range): 7.1 % — AB (ref 0.0–7.0)
HbA1c, POC (prediabetic range): 7.1 % — AB (ref 5.7–6.4)
Hemoglobin A1C: 7.1 % — AB (ref 4.0–5.6)

## 2020-11-25 MED ORDER — LISINOPRIL 5 MG PO TABS: 5.0000 mg | ORAL_TABLET | Freq: Every day | ORAL | 1 refills | Status: AC

## 2020-11-25 MED ORDER — GLIMEPIRIDE 2 MG PO TABS: 2.0000 mg | ORAL_TABLET | Freq: Every day | ORAL | 1 refills | Status: AC

## 2020-11-25 MED ORDER — FUROSEMIDE 20 MG PO TABS: 10.0000 mg | ORAL_TABLET | Freq: Every day | ORAL | 3 refills | Status: AC | PRN

## 2020-11-25 MED ORDER — SEMAGLUTIDE 7 MG PO TABS: ORAL_TABLET | ORAL | 2 refills | Status: DC

## 2020-11-25 NOTE — Telephone Encounter (Signed)
We have discontinued the Rybelsus secondary to cost. Start Amaryl 2 mg before first meal of the day.  This has been called in for her.  She had been on higher doses of this medication in the past and should be able to tolerate.  She just needs to make sure she takes before a meal.

## 2020-11-25 NOTE — Telephone Encounter (Signed)
New medication too expensive please advise of next step.

## 2020-11-25 NOTE — Progress Notes (Signed)
Patient ID: MARIONETTE MESKILL, female  DOB: 09/18/1935, 85 y.o.   MRN: [id number] Patient Care Team    Relationship Specialty Notifications Start End  Ma Hillock, DO PCP - General Family Medicine  08/26/20   Monna Fam, MD Consulting Physician Ophthalmology  08/08/16   Melrose Nakayama, MD Consulting Physician Orthopedic Surgery  08/08/16   Ladene Artist, MD Consulting Physician Gastroenterology  03/13/20   Sydnee Levans, MD Referring Physician Dermatology  03/13/20     Chief Complaint  Patient presents with  . Follow-up    [facility]; pt is fasting    Subjective: NAHLA LUKIN is a 85 y.o.  female present for [facility] Diabetes type 2: Pt reports compliance with Metformin total daily dose of 1000 mg and Rybelsus 3 mg.  She reports the Rybelsus is expensive for her.  Patient reports she does have chronic peripheral neuropathy. Denies worsening numbness, tingling of extremities, hypo/hyperglycemic events or non-healing wounds. Pt reports fasting blood glucoses have been around 180s.. She has been on other medications in the past including Janumet, Amaryl, Victoza and Actos (swelling). PNA series: Completed Flu shot: completed today (recommneded yearly) BMP: UTD 03/31/2020 with normal kidney function. Foot exam: Completed 03/31/2020 Eye exam: Completed 02/20/2020 with retinopathy in the past, however most recent eye exam reported no retinopathy. A1c: 7.5> 7.6>7.1 today.  Hypertension: Pt reports compliance with lisinopril 5 mg daily and Crestor 5 mg every other day. Blood pressures ranges at home normal limits. Patient denies chest pain, shortness of breath, dizziness or lower extremity edema. She states she used to be on Lasix as needed.  Pt is  prescribed statin.   Depression screen [facility] 2/9 11/25/2020 03/13/2020 05/08/2018 05/15/2017 08/08/2016  Decreased Interest 0 0 0 1 0  Down, Depressed, Hopeless 0 0 0 1 0  PHQ - 2 Score 0 0 0 2 0  Altered sleeping - - - 1 -  Tired, decreased energy  - - - 2 -  Change in appetite - - - 0 -  Feeling bad or failure about yourself  - - - 1 -  Trouble concentrating - - - 1 -  Moving slowly or fidgety/restless - - - 1 -  Suicidal thoughts - - - 0 -  PHQ-9 Score - - - 8 -   No flowsheet data found.        Fall Risk  11/25/2020 03/13/2020 05/08/2018 08/08/2016 02/19/2016  Falls in the past year? 1 1 No Yes No  Number falls in past yr: 0 1 - 1 -  Injury with Fall? 0 0 - No -  Risk for fall due to : - - - History of fall(s) -  Follow up - Education provided;Falls prevention discussed - Falls prevention discussed -     Immunization History  Administered Date(s) Administered  . Fluad Quad(high Dose 65+) 10/10/2019, 11/25/2020  . Hep A / Hep B 04/20/2020, 05/21/2020, 10/07/2020  . Influenza Split 10/20/2011, 11/29/2012  . Influenza Whole 08/26/2010  . Influenza, High Dose Seasonal PF 08/23/2016, 10/09/2018  . Influenza,inj,Quad PF,6+ Mos 09/05/2013, 08/28/2014, 11/12/2015  . PFIZER SARS-COV-2 Vaccination 02/06/2020, 03/02/2020  . Pneumococcal Conjugate-13 08/28/2014  . Pneumococcal Polysaccharide-23 11/12/2015  . Tdap 12/06/2018    No exam data present  Past Medical History:  Diagnosis Date  . Actinic keratoses 07/18/2011  . Alopecia 01/24/2011   Qualifier: Diagnosis of  By: [name], [name]    . Anterior dislocation of right shoulder   . Arthralgia 05/03/2015  .  Back pain 12/08/2011  . Basal cell carcinoma of skin of lip 01/24/2011  . Blood transfusion without reported diagnosis   . Cataract    bilataeral cateracts removed  . CHOLELITHIASIS, HX OF 02/07/2011  . Chronic kidney disease   . Diabetes mellitus type 2 in obese [facility]) 01/24/2011   Qualifier: Diagnosis of  By: [name], [name]  [facility] on 06/14/13   . Essential hypertension, benign 01/24/2011  . Fatty liver   . GERD 01/24/2011  . Grief reaction 01/20/2014  . HAIR LOSS 01/24/2011  . Hearing loss of both ears 06/28/2013  . Hepatic cirrhosis  ([place]) 04/27/2015  . Hyperlipidemia, mixed 04/24/2012  . Insomnia 07/24/2012  . Joint pain   . MEASLES, HX OF 01/24/2011  . Morbid obesity ([place]) 01/24/2011  . NONSPEC ELEVATION OF LEVELS OF TRANSAMINASE/LDH 02/07/2011  . Peripheral neuropathy 04/20/2011  . Right hip pain 05/03/2015  . SCC (squamous cell carcinoma), face 07/18/2011  . SQUAMOUS CELL CARCINOMA OTHER SPEC SITES SKIN 01/24/2011  . Swelling   . Tubular adenoma of colon 04/2015  . Umbilical hernia 1/65/7903  . URINARY INCONTINENCE 02/07/2011  . UTI'S, HX OF 01/24/2011  . Vulvar atrophy 03/17/2017   Allergies  Allergen Reactions  . Contrast Media [Iodinated Diagnostic Agents] Shortness Of Breath  . Doxycycline     Severe oral ulcers.  . Lovastatin     myalgias   Past Surgical History:  Procedure Laterality Date  . ABDOMINAL HYSTERECTOMY     partial, both ovaries left in place  . BCC removed below lower lip on left  26 yrs ago  . cataract extraction, b/l    . COLONOSCOPY    . Left knee arthroscopy and cleaning prior to TKR    . SHOULDER CLOSED REDUCTION Right 07/14/2018   Procedure: CLOSED REDUCTION SHOULDER, RIGHT;  Surgeon: Leandrew Koyanagi, MD;  Location: [place];  Service: Orthopedics;  Laterality: Right;  . skin bx, right anterior CW , squamous cancer, had to redo to margins  12/2010  . total knee replacement, b/l     Family History  Problem Relation Age of Onset  . Arthritis Mother        RA  . Myelodysplastic syndrome Mother   . Heart disease Mother   . Cancer Father        skin  . Stroke Father   . Colon polyps Father        29 in [place] intes removed carcinoid tumor confined in the tumor  . Stroke Maternal Grandmother   . Kidney failure Maternal Grandfather   . Esophageal cancer Neg Hx   . Rectal cancer Neg Hx   . Stomach cancer Neg Hx    Social History   Social History Narrative  . Not on file    Allergies as of 11/25/2020      Reactions   Contrast Media [iodinated Diagnostic Agents] Shortness Of Breath    Doxycycline    Severe oral ulcers.   Lovastatin    myalgias      Medication List       Accurate as of November 25, 2020  5:48 PM. If you have any questions, ask your nurse or doctor.        STOP taking these medications   Rybelsus 3 MG Tabs Generic drug: Semaglutide Stopped by: Howard Pouch, DO     TAKE these medications   fluorouracil 5 % cream Commonly known as: EFUDEX Apply topically daily. Use for two weeks  furosemide 20 MG tablet Commonly known as: LASIX Take 0.5 tablets (10 mg total) by mouth daily as needed.   glimepiride 2 MG tablet Commonly known as: AMARYL Take 1 tablet (2 mg total) by mouth daily before breakfast. Started by: Howard Pouch, DO   Lancets Misc Check blood sugars qam and once daily prn   lisinopril 5 MG tablet Commonly known as: ZESTRIL Take 1 tablet (5 mg total) by mouth daily.   metFORMIN 1000 MG tablet Commonly known as: GLUCOPHAGE Take 1 tablet (1,000 mg total) by mouth 2 (two) times daily with a meal. DC PRIOR script please.   OneTouch Ultra test strip Generic drug: glucose blood USE TO TEST BLOOD SUGAR TWICE A DAY AS DIRECTED   Prodigy Autocode Blood Glucose w/Device Kit 1 Device by Does not apply route 3 (three) times daily between meals as needed.   rosuvastatin 5 MG tablet Commonly known as: CRESTOR Take 1 tablet (5 mg total) by mouth every other day.       All past medical history, surgical history, allergies, family history, immunizations andmedications were updated in the EMR today and reviewed under the history and medication portions of their EMR.       ROS: 14 pt review of systems performed and negative (unless mentioned in an HPI)  Objective: BP 115/71   Pulse 84   Temp (!) 97.5 F (36.4 C) (Oral)   Ht 5' 2"  (1.575 m)   Wt 212 lb (96.2 kg)   SpO2 97%   BMI 38.78 kg/m  Gen: Afebrile. No acute distress.  Nontoxic in presentation.  Pleasant female.  Obese HENT: AT. [place].  Eyes:Pupils Equal Round Reactive  to light, Extraocular movements intact,  Conjunctiva without redness, discharge or icterus. Neck/lymp/endocrine: Supple, no lymphadenopathy, no thyromegaly CV: RRR no murmur, no edema, +2/4 P posterior tibialis pulses Chest: CTAB, no wheeze or crackles Abd: Soft.  Flat. NTND. BS present.  Skin: No rashes, purpura or petechiae.  Neuro:  Normal gait. PERLA. EOMi. Alert. Oriented x3  Psych: Normal affect, dress and demeanor. Normal speech. Normal thought content and judgment.  Results for orders placed or performed in visit on 11/25/20 (from the past 24 hour(s))  POCT HgB A1C     Status: Abnormal   Collection Time: 11/25/20  8:49 AM  Result Value Ref Range   Hemoglobin A1C 7.1 (A) 4.0 - 5.6 %   HbA1c POC (<> result, manual entry) 7.1 4.0 - 5.6 %   HbA1c, POC (prediabetic range) 7.1 (A) 5.7 - 6.4 %   HbA1c, POC (controlled diabetic range) 7.1 (A) 0.0 - 7.0 %    Assessment/plan: ERYKA DOLINGER is a 85 y.o. female present for TOC Type 2 diabetes mellitus without complication, without long-term current use of insulin (HCC)/neuropathy A1c 7.6>7.1 today. Discussed options for treatment with her today Rybelsus is rather expensive for her.  In review of her records she had been on Amaryl at one time and tolerated up until her sugar started to drop.  Patient would like to retry Amaryl. -Continue Metformin dose to 1000 mg twice daily -Discontinue Rybelsus due to cost -Start Amaryl 2 mg daily before first meal the day. - POCT HgB A1C PNA series: Completed Flu shot: completed today (recommneded yearly) BMP: UTD 03/31/2020 with normal kidney function. Foot exam: Completed 03/31/2020 Eye exam: Completed 02/20/2020 with retinopathy in the past, however most recent eye exam reported no retinopathy. A1c: 7.5> 7.6>7.1 today.  Essential hypertension, benign/hyperlipidemia/morbid obesity/intermittent lower extremity edema Stable.  Continue lisinopril 5 mg daily. Continue Lasix 10 mg daily as needed for  edema Continue Crestor every other day   Influenza vaccine provided today   Return in about 4 months (around 03/23/2021) for [facility] (30 min).  Orders Placed This Encounter  Procedures  . Flu Vaccine QUAD High Dose(Fluad)  . POCT HgB A1C   Meds ordered this encounter  Medications  . DISCONTD: Semaglutide 7 MG TABS    Sig: 1 tab daily.    Dispense:  30 tablet    Refill:  2  . lisinopril (ZESTRIL) 5 MG tablet    Sig: Take 1 tablet (5 mg total) by mouth daily.    Dispense:  90 tablet    Refill:  1    DC prior scripts.  . furosemide (LASIX) 20 MG tablet    Sig: Take 0.5 tablets (10 mg total) by mouth daily as needed.    Dispense:  30 tablet    Refill:  3  . glimepiride (AMARYL) 2 MG tablet    Sig: Take 1 tablet (2 mg total) by mouth daily before breakfast.    Dispense:  90 tablet    Refill:  1    Please discontinue rybelsus   Referral Orders  No referral(s) requested today     Note is dictated utilizing voice recognition software. Although note has been proof read prior to signing, occasional typographical errors still can be missed. If any questions arise, please do not hesitate to call for verification.  Electronically signed by: Howard Pouch, DO [place]

## 2020-11-25 NOTE — Patient Instructions (Signed)
Let us know if new med is too expensive.   A1c is 7.1 today.   Follow up in 3-4 months.

## 2020-11-26 NOTE — Telephone Encounter (Signed)
LVM for pt to CB regarding rx change.

## 2020-11-26 NOTE — Telephone Encounter (Signed)
Patient advised and voiced understanding.  

## 2021-01-08 DIAGNOSIS — N393 Stress incontinence (female) (male): Secondary | ICD-10-CM | POA: Diagnosis not present

## 2021-01-08 DIAGNOSIS — Z809 Family history of malignant neoplasm, unspecified: Secondary | ICD-10-CM | POA: Diagnosis not present

## 2021-01-08 DIAGNOSIS — M1990 Unspecified osteoarthritis, unspecified site: Secondary | ICD-10-CM | POA: Diagnosis not present

## 2021-01-08 DIAGNOSIS — I10 Essential (primary) hypertension: Secondary | ICD-10-CM | POA: Diagnosis not present

## 2021-01-08 DIAGNOSIS — E119 Type 2 diabetes mellitus without complications: Secondary | ICD-10-CM | POA: Diagnosis not present

## 2021-01-08 DIAGNOSIS — R600 Localized edema: Secondary | ICD-10-CM | POA: Diagnosis not present

## 2021-01-08 DIAGNOSIS — E785 Hyperlipidemia, unspecified: Secondary | ICD-10-CM | POA: Diagnosis not present

## 2021-01-08 DIAGNOSIS — Z7984 Long term (current) use of oral hypoglycemic drugs: Secondary | ICD-10-CM | POA: Diagnosis not present

## 2021-01-08 DIAGNOSIS — E669 Obesity, unspecified: Secondary | ICD-10-CM | POA: Diagnosis not present

## 2021-01-08 DIAGNOSIS — Z791 Long term (current) use of non-steroidal anti-inflammatories (NSAID): Secondary | ICD-10-CM | POA: Diagnosis not present

## 2021-01-12 ENCOUNTER — Encounter (HOSPITAL_BASED_OUTPATIENT_CLINIC_OR_DEPARTMENT_OTHER): Payer: Self-pay

## 2021-01-12 ENCOUNTER — Emergency Department (HOSPITAL_BASED_OUTPATIENT_CLINIC_OR_DEPARTMENT_OTHER)
Admission: EM | Admit: 2021-01-12 | Discharge: 2021-01-12 | Disposition: A | Discharge status: Home / Self Care | Payer: Medicare HMO | Attending: Emergency Medicine | Admitting: Emergency Medicine

## 2021-01-12 ENCOUNTER — Other Ambulatory Visit: Payer: Self-pay

## 2021-01-12 ENCOUNTER — Emergency Department (HOSPITAL_BASED_OUTPATIENT_CLINIC_OR_DEPARTMENT_OTHER): Payer: Medicare HMO

## 2021-01-12 DIAGNOSIS — R1032 Left lower quadrant pain: Secondary | ICD-10-CM | POA: Diagnosis not present

## 2021-01-12 DIAGNOSIS — Z85828 Personal history of other malignant neoplasm of skin: Secondary | ICD-10-CM | POA: Diagnosis not present

## 2021-01-12 DIAGNOSIS — K8020 Calculus of gallbladder without cholecystitis without obstruction: Secondary | ICD-10-CM | POA: Diagnosis not present

## 2021-01-12 DIAGNOSIS — I10 Essential (primary) hypertension: Secondary | ICD-10-CM | POA: Diagnosis not present

## 2021-01-12 DIAGNOSIS — K219 Gastro-esophageal reflux disease without esophagitis: Secondary | ICD-10-CM | POA: Diagnosis not present

## 2021-01-12 DIAGNOSIS — E119 Type 2 diabetes mellitus without complications: Secondary | ICD-10-CM | POA: Insufficient documentation

## 2021-01-12 DIAGNOSIS — Z7984 Long term (current) use of oral hypoglycemic drugs: Secondary | ICD-10-CM | POA: Diagnosis not present

## 2021-01-12 DIAGNOSIS — Z96653 Presence of artificial knee joint, bilateral: Secondary | ICD-10-CM | POA: Insufficient documentation

## 2021-01-12 DIAGNOSIS — I700 Atherosclerosis of aorta: Secondary | ICD-10-CM | POA: Diagnosis not present

## 2021-01-12 DIAGNOSIS — K429 Umbilical hernia without obstruction or gangrene: Secondary | ICD-10-CM | POA: Diagnosis not present

## 2021-01-12 DIAGNOSIS — K7460 Unspecified cirrhosis of liver: Secondary | ICD-10-CM | POA: Diagnosis not present

## 2021-01-12 DIAGNOSIS — Z79899 Other long term (current) drug therapy: Secondary | ICD-10-CM | POA: Insufficient documentation

## 2021-01-12 LAB — COMPREHENSIVE METABOLIC PANEL
ALT: 14 U/L (ref 0–44)
AST: 29 U/L (ref 15–41)
Albumin: 3.5 g/dL (ref 3.5–5.0)
Alkaline Phosphatase: 79 U/L (ref 38–126)
Anion gap: 10 (ref 5–15)
BUN: 14 mg/dL (ref 8–23)
CO2: 24 mmol/L (ref 22–32)
Calcium: 9.2 mg/dL (ref 8.9–10.3)
Chloride: 106 mmol/L (ref 98–111)
Creatinine, Ser: 0.8 mg/dL (ref 0.44–1.00)
GFR, Estimated: >60 mL/min (ref >60)
Glucose, Bld: 135 mg/dL — ABNORMAL HIGH (ref 70–99)
Potassium: 3.6 mmol/L (ref 3.5–5.1)
Sodium: 140 mmol/L (ref 135–145)
Total Bilirubin: 0.8 mg/dL (ref 0.3–1.2)
Total Protein: 6.7 g/dL (ref 6.5–8.1)

## 2021-01-12 LAB — CBC WITH DIFFERENTIAL/PLATELET
Abs Immature Granulocytes: 0.03 10*3/uL (ref 0.00–0.07)
Basophils Absolute: 0 10*3/uL (ref 0.0–0.1)
Basophils Relative: 0 %
Eosinophils Absolute: 0.1 10*3/uL (ref 0.0–0.5)
Eosinophils Relative: 2 %
HCT: 37.4 % (ref 36.0–46.0)
Hemoglobin: 12.4 g/dL (ref 12.0–15.0)
Immature Granulocytes: 0 %
Lymphocytes Relative: 16 %
Lymphs Abs: 1 10*3/uL (ref 0.7–4.0)
MCH: 31.5 pg (ref 26.0–34.0)
MCHC: 33.2 g/dL (ref 30.0–36.0)
MCV: 94.9 fL (ref 80.0–100.0)
Monocytes Absolute: 0.8 10*3/uL (ref 0.1–1.0)
Monocytes Relative: 12 %
Neutro Abs: 4.7 10*3/uL (ref 1.7–7.7)
Neutrophils Relative %: 70 %
Platelets: 170 10*3/uL (ref 150–400)
RBC: 3.94 MIL/uL (ref 3.87–5.11)
RDW: 13 % (ref 11.5–15.5)
WBC: 6.7 10*3/uL (ref 4.0–10.5)
nRBC: 0 % (ref 0.0–0.2)

## 2021-01-12 LAB — LIPASE, BLOOD: Lipase: 27 U/L (ref 11–51)

## 2021-01-12 MED ORDER — MORPHINE SULFATE (PF) 4 MG/ML IV SOLN
4.0000 mg | Freq: Once | INTRAVENOUS | Status: AC
  Administered 2021-01-12: 4 mg via INTRAVENOUS
  Filled 2021-01-12: qty 1

## 2021-01-12 MED ORDER — SODIUM CHLORIDE 0.9 % IV BOLUS
1000.0000 mL | Freq: Once | INTRAVENOUS | Status: AC
  Administered 2021-01-12: 1000 mL via INTRAVENOUS

## 2021-01-12 MED ORDER — ONDANSETRON HCL 4 MG/2ML IJ SOLN
4.0000 mg | Freq: Once | INTRAMUSCULAR | Status: AC
  Administered 2021-01-12: 4 mg via INTRAVENOUS
  Filled 2021-01-12: qty 2

## 2021-01-12 NOTE — ED Provider Notes (Signed)
[place] EMERGENCY DEPARTMENT Provider Note   CSN: [id number] Arrival date & time: 01/12/21  6010     History Chief Complaint  Patient presents with  . Hernia    Stacey Cooper is a 86 y.o. female.  86 yo F with a chief complaints of left lower quadrant abdominal pain.  This is described as burning and sharp.  Has been off and on for the past few days.  She denies fevers denies vomiting denies diarrhea.  She denies trauma to the abdomen.  She had similar pain focally over her umbilicus that she thought might have been because of her symptoms.  She has not seen a surgeon for her umbilical hernia previously.  The history is provided by the patient.  Illness Severity:  Moderate Onset quality:  Gradual Duration:  3 days Timing:  Constant Progression:  Worsening Chronicity:  New Associated symptoms: abdominal pain   Associated symptoms: no chest pain, no congestion, no fever, no headaches, no myalgias, no nausea, no rhinorrhea, no shortness of breath, no vomiting and no wheezing        Past Medical History:  Diagnosis Date  . Actinic keratoses 07/18/2011  . Alopecia 01/24/2011   Qualifier: Diagnosis of  By: [name], [name]    . Anterior dislocation of right shoulder   . Arthralgia 05/03/2015  . Back pain 12/08/2011  . Basal cell carcinoma of skin of lip 01/24/2011  . Blood transfusion without reported diagnosis   . Cataract    bilataeral cateracts removed  . CHOLELITHIASIS, HX OF 02/07/2011  . Chronic kidney disease   . Diabetes mellitus type 2 in obese [facility]) 01/24/2011   Qualifier: Diagnosis of  By: [name], [name]  Annual eye exam with [facility] on 06/14/13   . Essential hypertension, benign 01/24/2011  . Fatty liver   . GERD 01/24/2011  . Grief reaction 01/20/2014  . HAIR LOSS 01/24/2011  . Hearing loss of both ears 06/28/2013  . Hepatic cirrhosis ([place]) 04/27/2015  . Hyperlipidemia, mixed 04/24/2012  . Insomnia 07/24/2012  . Joint pain   . MEASLES, HX OF  01/24/2011  . Morbid obesity ([place]) 01/24/2011  . NONSPEC ELEVATION OF LEVELS OF TRANSAMINASE/LDH 02/07/2011  . Peripheral neuropathy 04/20/2011  . Right hip pain 05/03/2015  . SCC (squamous cell carcinoma), face 07/18/2011  . SQUAMOUS CELL CARCINOMA OTHER SPEC SITES SKIN 01/24/2011  . Swelling   . Tubular adenoma of colon 04/2015  . Umbilical hernia 9/32/3557  . URINARY INCONTINENCE 02/07/2011  . UTI'S, HX OF 01/24/2011  . Vulvar atrophy 03/17/2017    Patient Active Problem List   Diagnosis Date Noted  . Localized swelling of both lower extremities 09/16/2019  . Subclinical hypothyroidism 01/30/2018  . Type 2 diabetes mellitus without complication, without long-term current use of insulin ([place]) 09/26/2017  . Vitamin D deficiency 08/10/2017  . Vulvar atrophy 03/17/2017  . Umbilical hernia 32/20/2542  . Postmenopausal estrogen deficiency 11/20/2015  . Hepatic cirrhosis ([place]) 04/27/2015  . Hearing loss of both ears 06/28/2013  . Hyperlipidemia, mixed 04/24/2012  . Peripheral neuropathy 04/20/2011  . URINARY INCONTINENCE 02/07/2011  . Morbid obesity ([place]) 01/24/2011  . ESSENTIAL HYPERTENSION, BENIGN 01/24/2011  . GERD 01/24/2011    Past Surgical History:  Procedure Laterality Date  . ABDOMINAL HYSTERECTOMY     partial, both ovaries left in place  . BCC removed below lower lip on left  26 yrs ago  . cataract extraction, b/l    . COLONOSCOPY    .  Left knee arthroscopy and cleaning prior to TKR    . SHOULDER CLOSED REDUCTION Right 07/14/2018   Procedure: CLOSED REDUCTION SHOULDER, RIGHT;  Surgeon: Leandrew Koyanagi, MD;  Location: [facility];  Service: Orthopedics;  Laterality: Right;  . skin bx, right anterior CW , squamous cancer, had to redo to margins  12/2010  . total knee replacement, b/l       OB History    Gravida  1   Para  1   Term      Preterm      AB      Living        SAB      IAB      Ectopic      Multiple      Live Births  1           Family History   Problem Relation Age of Onset  . Arthritis Mother        RA  . Myelodysplastic syndrome Mother   . Heart disease Mother   . Cancer Father        skin  . Stroke Father   . Colon polyps Father        33 in [place] intes removed carcinoid tumor confined in the tumor  . Stroke Maternal Grandmother   . Kidney failure Maternal Grandfather   . Esophageal cancer Neg Hx   . Rectal cancer Neg Hx   . Stomach cancer Neg Hx     Social History   Tobacco Use  . Smoking status: Never Smoker  . Smokeless tobacco: Never Used  Vaping Use  . Vaping Use: Never used  Substance Use Topics  . Alcohol use: No    Alcohol/week: 0.0 standard drinks  . Drug use: No    Home Medications Prior to Admission medications   Medication Sig Start Date End Date Taking? Authorizing Provider  Blood Glucose Monitoring Suppl (PRODIGY AUTOCODE BLOOD GLUCOSE) w/Device KIT 1 Device by Does not apply route 3 (three) times daily between meals as needed. 02/19/18   Mosie Lukes, MD  fluorouracil (EFUDEX) 5 % cream Apply topically daily. Use for two weeks    [provider]  furosemide (LASIX) 20 MG tablet Take 0.5 tablets (10 mg total) by mouth daily as needed. 11/25/20   Kuneff, [name], DO  glimepiride (AMARYL) 2 MG tablet Take 1 tablet (2 mg total) by mouth daily before breakfast. 11/25/20   Raoul Pitch, [name], DO  Lancets MISC Check blood sugars qam and once daily prn 08/09/18   Mosie Lukes, MD  lisinopril (ZESTRIL) 5 MG tablet Take 1 tablet (5 mg total) by mouth daily. 11/25/20   Kuneff, [name], DO  metFORMIN (GLUCOPHAGE) 1000 MG tablet Take 1 tablet (1,000 mg total) by mouth 2 (two) times daily with a meal. DC PRIOR script please. 08/26/20   Kuneff, [name], DO  ONETOUCH ULTRA test strip USE TO TEST BLOOD SUGAR TWICE A DAY AS DIRECTED 07/28/20   Wendling, Crosby Oyster, DO  rosuvastatin (CRESTOR) 5 MG tablet Take 1 tablet (5 mg total) by mouth every other day. 08/26/20   Kuneff, [name], DO    Allergies     Contrast media [iodinated diagnostic agents], Doxycycline, and Lovastatin  Review of Systems   Review of Systems  Constitutional: Negative for chills and fever.  HENT: Negative for congestion and rhinorrhea.   Eyes: Negative for redness and visual disturbance.  Respiratory: Negative for shortness of breath and  wheezing.   Cardiovascular: Negative for chest pain and palpitations.  Gastrointestinal: Positive for abdominal pain. Negative for nausea and vomiting.  Genitourinary: Negative for dysuria and urgency.  Musculoskeletal: Negative for arthralgias and myalgias.  Skin: Negative for pallor and wound.  Neurological: Negative for dizziness and headaches.    Physical Exam Updated Vital Signs BP (!) 123/56 (BP Location: Right Arm)   Pulse 67   Temp 97.9 F (36.6 C) (Oral)   Resp 18 Comment: Simultaneous filing. User may not have seen previous data.  Ht 5' 4"  (1.626 m)   Wt 95.7 kg   SpO2 98%   BMI 36.22 kg/m   Physical Exam Vitals and nursing note reviewed.  Constitutional:      General: She is not in acute distress.    Appearance: She is well-developed and well-nourished. She is not diaphoretic.  HENT:     Head: Normocephalic and atraumatic.  Eyes:     Extraocular Movements: EOM normal.     Pupils: Pupils are equal, round, and reactive to light.  Cardiovascular:     Rate and Rhythm: Normal rate and regular rhythm.     Heart sounds: No murmur heard. No friction rub. No gallop.   Pulmonary:     Effort: Pulmonary effort is normal.     Breath sounds: No wheezing or rales.  Abdominal:     General: There is no distension.     Palpations: Abdomen is soft.     Tenderness: There is abdominal tenderness.     Hernia: A hernia is present.     Comments: Periumbilical hernia reduced easily at bedside significant left lower quadrant abdominal tenderness  Musculoskeletal:        General: No tenderness or edema.     Cervical back: Normal range of motion and neck supple.  Skin:     General: Skin is warm and dry.  Neurological:     Mental Status: She is alert and oriented to person, place, and time.  Psychiatric:        Mood and Affect: Mood and affect normal.        Behavior: Behavior normal.     ED Results / Procedures / Treatments   Labs (all labs ordered are listed, but only abnormal results are displayed) Labs Reviewed  COMPREHENSIVE METABOLIC PANEL - Abnormal; Notable for the following components:      Result Value   Glucose, Bld 135 (*)    All other components within normal limits  CBC WITH DIFFERENTIAL/PLATELET  LIPASE, BLOOD    EKG None  Radiology CT ABDOMEN PELVIS WO CONTRAST  Result Date: 01/12/2021 CLINICAL DATA:  Left lower quadrant abdominal pain. EXAM: CT ABDOMEN AND PELVIS WITHOUT CONTRAST TECHNIQUE: Multidetector CT imaging of the abdomen and pelvis was performed following the standard protocol without IV contrast. COMPARISON:  April 08, 2015. FINDINGS: Lower chest: No acute abnormality. Hepatobiliary: Hepatic cirrhosis is noted. Hepatic mass cannot be excluded on the basis of this exam due to lack of intravenous contrast. Large gallstone is noted. No biliary dilatation is noted. Pancreas: Unremarkable. No pancreatic ductal dilatation or surrounding inflammatory changes. Spleen: Normal in size without focal abnormality. Adrenals/Urinary Tract: Adrenal glands are unremarkable. Kidneys are normal, without renal calculi, focal lesion, or hydronephrosis. Bladder is unremarkable. Stomach/Bowel: Stomach is within normal limits. Appendix appears normal. No evidence of bowel wall thickening, distention, or inflammatory changes. Vascular/Lymphatic: Aortic atherosclerosis. No enlarged abdominal or pelvic lymph nodes. Reproductive: Status post hysterectomy. No adnexal masses. Other: No ascites is noted.  Moderate size fat containing periumbilical hernia is noted with a large amount of inflammatory stranding. Musculoskeletal: No acute or significant osseous  findings. IMPRESSION: 1. Hepatic cirrhosis. Hepatic mass cannot be excluded on the basis of this exam due to lack of intravenous contrast. 2. Large gallstone is noted. 3. Moderate size fat containing periumbilical hernia is noted with a large amount of inflammatory stranding. 4. Aortic atherosclerosis. Aortic Atherosclerosis (ICD10-I70.0). Electronically Signed   By: Marijo Conception M.D.   On: 01/12/2021 11:19    Procedures Hernia reduction  Date/Time: 01/12/2021 11:17 AM Performed by: Deno Etienne, DO Authorized by: Deno Etienne, DO  Consent: Verbal consent obtained. Consent given by: patient Required items: required blood products, implants, devices, and special equipment available Time out: Immediately prior to procedure a "time out" was called to verify the correct patient, procedure, equipment, support staff and site/side marked as required. Preparation: Patient was prepped and draped in the usual sterile fashion. Local anesthesia used: no  Anesthesia: Local anesthesia used: no  Sedation: Patient sedated: no  Patient tolerance: patient tolerated the procedure well with no immediate complications    (including critical care time)  Medications Ordered in ED Medications  sodium chloride 0.9 % bolus 1,000 mL (0 mLs Intravenous Stopped 01/12/21 1209)  morphine 4 MG/ML injection 4 mg (4 mg Intravenous Given 01/12/21 1057)  ondansetron (ZOFRAN) injection 4 mg (4 mg Intravenous Given 01/12/21 1057)    ED Course  I have reviewed the triage vital signs and the nursing notes.  Pertinent labs & imaging results that were available during my care of the patient were reviewed by me and considered in my medical decision making (see chart for details).    MDM Rules/Calculators/A&P                          86 yo F with a chief complaints of LLQ abdominal pain.  Going on for the past few days.  Worse to the left lower quadrant.  Seems unlikely to be related to her hernia.  Will obtain a CT  scan.  Blood work.  Reassess.  CT scan with some signs of irritation to the hernia.  No obvious other findings.  On reassessment the patient is feeling much better.  Again easily reducible umbilical hernia.  We will have her follow-up with general surgery.  3:07 PM:  I have discussed the diagnosis/risks/treatment options with the patient and believe the pt to be eligible for discharge home to follow-up with Gen surgery. We also discussed returning to the ED immediately if new or worsening sx occur. We discussed the sx which are most concerning (e.g., sudden worsening pain, fever, inability to tolerate by mouth) that necessitate immediate return. Medications administered to the patient during their visit and any new prescriptions provided to the patient are listed below.  Medications given during this visit Medications  sodium chloride 0.9 % bolus 1,000 mL (0 mLs Intravenous Stopped 01/12/21 1209)  morphine 4 MG/ML injection 4 mg (4 mg Intravenous Given 01/12/21 1057)  ondansetron (ZOFRAN) injection 4 mg (4 mg Intravenous Given 01/12/21 1057)     The patient appears reasonably screen and/or stabilized for discharge and I doubt any other medical condition or other [facility] requiring further screening, evaluation, or treatment in the ED at this time prior to discharge.   Final Clinical Impression(s) / ED Diagnoses Final diagnoses:  Umbilical hernia without obstruction and without gangrene    Rx / DC Orders ED Discharge  Orders    None       Deno Etienne, DO 01/12/21 1507

## 2021-01-12 NOTE — Discharge Instructions (Signed)
If your hernia starts hurting you significantly then lie back flat and apply direct pressure to the area gently with increasing pressure.  If you are unable to get it to go back it or if it starts for any worse or if he cannot keep anything down then please return to the emergency department.  Otherwise follow-up with the general surgeon in the office for evaluation.

## 2021-01-12 NOTE — ED Triage Notes (Signed)
Pt arrives with c/o umbilical hernia pain since Friday, states swelling and burning started Saturday. Pt reports swelling is better when lying flat.

## 2021-01-12 NOTE — ED Notes (Signed)
Taken to CT at this time. 

## 2021-01-25 ENCOUNTER — Ambulatory Visit: Payer: Medicare HMO | Admitting: Physician Assistant

## 2021-02-16 ENCOUNTER — Other Ambulatory Visit: Payer: Self-pay | Admitting: Family Medicine

## 2021-02-22 DIAGNOSIS — H04123 Dry eye syndrome of bilateral lacrimal glands: Secondary | ICD-10-CM | POA: Diagnosis not present

## 2021-02-22 DIAGNOSIS — D492 Neoplasm of unspecified behavior of bone, soft tissue, and skin: Secondary | ICD-10-CM | POA: Diagnosis not present

## 2021-02-22 DIAGNOSIS — H40013 Open angle with borderline findings, low risk, bilateral: Secondary | ICD-10-CM | POA: Diagnosis not present

## 2021-02-22 DIAGNOSIS — E119 Type 2 diabetes mellitus without complications: Secondary | ICD-10-CM | POA: Diagnosis not present

## 2021-02-22 LAB — HM DIABETES EYE EXAM

## 2021-03-14 ENCOUNTER — Other Ambulatory Visit: Payer: Self-pay | Admitting: Family Medicine

## 2021-03-24 ENCOUNTER — Ambulatory Visit: Payer: Medicare HMO

## 2021-03-24 ENCOUNTER — Ambulatory Visit: Payer: Medicare HMO | Admitting: Family Medicine

## 2021-03-25 DIAGNOSIS — R69 Illness, unspecified: Secondary | ICD-10-CM | POA: Diagnosis not present

## 2021-03-25 DIAGNOSIS — Z13220 Encounter for screening for lipoid disorders: Secondary | ICD-10-CM | POA: Diagnosis not present

## 2021-03-25 DIAGNOSIS — Z79899 Other long term (current) drug therapy: Secondary | ICD-10-CM | POA: Diagnosis not present

## 2021-03-25 DIAGNOSIS — Z0000 Encounter for general adult medical examination without abnormal findings: Secondary | ICD-10-CM | POA: Diagnosis not present

## 2021-03-25 DIAGNOSIS — E119 Type 2 diabetes mellitus without complications: Secondary | ICD-10-CM | POA: Diagnosis not present

## 2021-03-25 DIAGNOSIS — E785 Hyperlipidemia, unspecified: Secondary | ICD-10-CM | POA: Diagnosis not present

## 2021-03-25 DIAGNOSIS — R609 Edema, unspecified: Secondary | ICD-10-CM | POA: Diagnosis not present

## 2021-03-25 DIAGNOSIS — Z7984 Long term (current) use of oral hypoglycemic drugs: Secondary | ICD-10-CM | POA: Diagnosis not present

## 2021-03-25 DIAGNOSIS — R32 Unspecified urinary incontinence: Secondary | ICD-10-CM | POA: Diagnosis not present

## 2021-03-25 DIAGNOSIS — E559 Vitamin D deficiency, unspecified: Secondary | ICD-10-CM | POA: Diagnosis not present

## 2021-03-25 DIAGNOSIS — Z0001 Encounter for general adult medical examination with abnormal findings: Secondary | ICD-10-CM | POA: Diagnosis not present

## 2021-03-25 DIAGNOSIS — F5104 Psychophysiologic insomnia: Secondary | ICD-10-CM | POA: Diagnosis not present

## 2021-04-07 ENCOUNTER — Ambulatory Visit: Payer: Medicare HMO | Admitting: Family Medicine

## 2021-05-10 DIAGNOSIS — R609 Edema, unspecified: Secondary | ICD-10-CM | POA: Diagnosis not present

## 2021-05-10 DIAGNOSIS — R7989 Other specified abnormal findings of blood chemistry: Secondary | ICD-10-CM | POA: Diagnosis not present

## 2021-05-10 DIAGNOSIS — E038 Other specified hypothyroidism: Secondary | ICD-10-CM | POA: Diagnosis not present

## 2021-05-10 DIAGNOSIS — R600 Localized edema: Secondary | ICD-10-CM | POA: Diagnosis not present

## 2021-05-10 DIAGNOSIS — E02 Subclinical iodine-deficiency hypothyroidism: Secondary | ICD-10-CM | POA: Diagnosis not present

## 2021-05-10 DIAGNOSIS — R791 Abnormal coagulation profile: Secondary | ICD-10-CM | POA: Diagnosis not present

## 2021-05-20 ENCOUNTER — Other Ambulatory Visit: Payer: Self-pay | Admitting: *Deleted

## 2021-05-20 DIAGNOSIS — M7989 Other specified soft tissue disorders: Secondary | ICD-10-CM

## 2021-05-31 ENCOUNTER — Encounter (HOSPITAL_COMMUNITY): Payer: Medicare HMO

## 2021-06-02 ENCOUNTER — Telehealth: Payer: Self-pay | Admitting: Gastroenterology

## 2021-06-02 DIAGNOSIS — K7460 Unspecified cirrhosis of liver: Secondary | ICD-10-CM

## 2021-06-02 NOTE — Telephone Encounter (Signed)
Patient is due of labs and RUQ [name].  She is notified to come for labs at her convenience and she will be contacted directly by [facility] scheduling to arrange [name]

## 2021-06-02 NOTE — Telephone Encounter (Signed)
Inbound call from pt requesting a call back because she has questions regarding a [name].

## 2021-06-03 ENCOUNTER — Other Ambulatory Visit: Payer: Self-pay

## 2021-06-03 DIAGNOSIS — M7989 Other specified soft tissue disorders: Secondary | ICD-10-CM

## 2021-06-14 ENCOUNTER — Other Ambulatory Visit: Payer: Self-pay

## 2021-06-14 ENCOUNTER — Ambulatory Visit (HOSPITAL_COMMUNITY)
Admission: RE | Admit: 2021-06-14 | Discharge: 2021-06-14 | Disposition: A | Discharge status: Home / Self Care | Payer: Medicare HMO | Source: Ambulatory Visit | Attending: Gastroenterology | Admitting: Gastroenterology

## 2021-06-14 ENCOUNTER — Other Ambulatory Visit (INDEPENDENT_AMBULATORY_CARE_PROVIDER_SITE_OTHER): Payer: Medicare HMO

## 2021-06-14 DIAGNOSIS — K8020 Calculus of gallbladder without cholecystitis without obstruction: Secondary | ICD-10-CM | POA: Diagnosis not present

## 2021-06-14 DIAGNOSIS — K7460 Unspecified cirrhosis of liver: Secondary | ICD-10-CM

## 2021-06-14 LAB — COMPREHENSIVE METABOLIC PANEL
ALT: 14 U/L (ref 0–35)
AST: 29 U/L (ref 0–37)
Albumin: 4 g/dL (ref 3.5–5.2)
Alkaline Phosphatase: 94 U/L (ref 39–117)
BUN: 17 mg/dL (ref 6–23)
CO2: 27 mEq/L (ref 19–32)
Calcium: 9.9 mg/dL (ref 8.4–10.5)
Chloride: 104 mEq/L (ref 96–112)
Creatinine, Ser: 0.86 mg/dL (ref 0.40–1.20)
GFR: 62.32 mL/min (ref >60.00)
Glucose, Bld: 121 mg/dL — ABNORMAL HIGH (ref 70–99)
Potassium: 4.1 mEq/L (ref 3.5–5.1)
Sodium: 141 mEq/L (ref 135–145)
Total Bilirubin: 0.8 mg/dL (ref 0.2–1.2)
Total Protein: 7.4 g/dL (ref 6.0–8.3)

## 2021-06-14 LAB — CBC
HCT: 38.3 % (ref 36.0–46.0)
Hemoglobin: 12.8 g/dL (ref 12.0–15.0)
MCHC: 33.3 g/dL (ref 30.0–36.0)
MCV: 92.8 fl (ref 78.0–100.0)
Platelets: 173 10*3/uL (ref 150.0–400.0)
RBC: 4.13 Mil/uL (ref 3.87–5.11)
RDW: 13.8 % (ref 11.5–15.5)
WBC: 7.1 10*3/uL (ref 4.0–10.5)

## 2021-06-14 LAB — PROTIME-INR
INR: 1.1 ratio — ABNORMAL HIGH (ref 0.8–1.0)
Prothrombin Time: 11.9 s (ref 9.6–13.1)

## 2021-06-15 DIAGNOSIS — R5383 Other fatigue: Secondary | ICD-10-CM | POA: Diagnosis not present

## 2021-06-15 DIAGNOSIS — R600 Localized edema: Secondary | ICD-10-CM | POA: Diagnosis not present

## 2021-06-15 LAB — AFP TUMOR MARKER: AFP-Tumor Marker: 4.1 ng/mL

## 2021-06-16 ENCOUNTER — Other Ambulatory Visit: Payer: Medicare HMO | Admitting: *Deleted

## 2021-06-16 DIAGNOSIS — C44329 Squamous cell carcinoma of skin of other parts of face: Secondary | ICD-10-CM | POA: Diagnosis not present

## 2021-06-16 DIAGNOSIS — L570 Actinic keratosis: Secondary | ICD-10-CM | POA: Diagnosis not present

## 2021-06-16 DIAGNOSIS — D1801 Hemangioma of skin and subcutaneous tissue: Secondary | ICD-10-CM | POA: Diagnosis not present

## 2021-06-16 DIAGNOSIS — Z85828 Personal history of other malignant neoplasm of skin: Secondary | ICD-10-CM | POA: Diagnosis not present

## 2021-06-16 DIAGNOSIS — R600 Localized edema: Secondary | ICD-10-CM | POA: Diagnosis not present

## 2021-06-16 DIAGNOSIS — D225 Melanocytic nevi of trunk: Secondary | ICD-10-CM | POA: Diagnosis not present

## 2021-06-16 DIAGNOSIS — L814 Other melanin hyperpigmentation: Secondary | ICD-10-CM | POA: Diagnosis not present

## 2021-06-16 DIAGNOSIS — L821 Other seborrheic keratosis: Secondary | ICD-10-CM | POA: Diagnosis not present

## 2021-06-16 DIAGNOSIS — D485 Neoplasm of uncertain behavior of skin: Secondary | ICD-10-CM | POA: Diagnosis not present

## 2021-06-16 DIAGNOSIS — R5383 Other fatigue: Secondary | ICD-10-CM | POA: Diagnosis not present

## 2021-06-16 NOTE — Patient Outreach (Signed)
[facility] [facility]) Care Management  06/16/2021  Stacey Cooper 25-May-1935 [id number]  Successful telephone outreach call to patient for screening. HIPAA identifiers obtained. Nurse reviewed [facility] program and services with patient. Patient stated that she did not feel like the services would benefit her at this time. Patient did state that she would like a pamphlet to be mailed to her and that this nurse could follow up with her every few months to inquire if there are any health and wellness needs.   Plan: RN Health Coach will send successful letter and Cook RN, BSN [place] [phone] [email or web address]

## 2021-06-22 ENCOUNTER — Ambulatory Visit (HOSPITAL_COMMUNITY): Payer: Medicare HMO

## 2021-07-05 DIAGNOSIS — R600 Localized edema: Secondary | ICD-10-CM | POA: Diagnosis not present

## 2021-07-05 DIAGNOSIS — R5383 Other fatigue: Secondary | ICD-10-CM | POA: Diagnosis not present

## 2021-07-05 DIAGNOSIS — R0600 Dyspnea, unspecified: Secondary | ICD-10-CM | POA: Diagnosis not present

## 2021-07-14 DIAGNOSIS — E119 Type 2 diabetes mellitus without complications: Secondary | ICD-10-CM | POA: Diagnosis not present

## 2021-07-14 DIAGNOSIS — R69 Illness, unspecified: Secondary | ICD-10-CM | POA: Diagnosis not present

## 2021-07-14 DIAGNOSIS — Z79899 Other long term (current) drug therapy: Secondary | ICD-10-CM | POA: Diagnosis not present

## 2021-07-14 DIAGNOSIS — Z7984 Long term (current) use of oral hypoglycemic drugs: Secondary | ICD-10-CM | POA: Diagnosis not present

## 2021-07-14 DIAGNOSIS — R32 Unspecified urinary incontinence: Secondary | ICD-10-CM | POA: Diagnosis not present

## 2021-07-14 DIAGNOSIS — E538 Deficiency of other specified B group vitamins: Secondary | ICD-10-CM | POA: Diagnosis not present

## 2021-07-14 DIAGNOSIS — E02 Subclinical iodine-deficiency hypothyroidism: Secondary | ICD-10-CM | POA: Diagnosis not present

## 2021-07-14 DIAGNOSIS — E785 Hyperlipidemia, unspecified: Secondary | ICD-10-CM | POA: Diagnosis not present

## 2021-07-14 DIAGNOSIS — R600 Localized edema: Secondary | ICD-10-CM | POA: Diagnosis not present

## 2021-07-15 DIAGNOSIS — C44329 Squamous cell carcinoma of skin of other parts of face: Secondary | ICD-10-CM | POA: Diagnosis not present

## 2021-07-22 DIAGNOSIS — D485 Neoplasm of uncertain behavior of skin: Secondary | ICD-10-CM | POA: Diagnosis not present

## 2021-07-22 DIAGNOSIS — L820 Inflamed seborrheic keratosis: Secondary | ICD-10-CM | POA: Diagnosis not present

## 2021-07-27 DIAGNOSIS — H903 Sensorineural hearing loss, bilateral: Secondary | ICD-10-CM | POA: Diagnosis not present

## 2021-07-28 DIAGNOSIS — H903 Sensorineural hearing loss, bilateral: Secondary | ICD-10-CM | POA: Diagnosis not present

## 2021-08-08 ENCOUNTER — Other Ambulatory Visit: Payer: Self-pay | Admitting: Family Medicine

## 2021-08-16 DIAGNOSIS — M25552 Pain in left hip: Secondary | ICD-10-CM | POA: Diagnosis not present

## 2021-08-16 DIAGNOSIS — M5432 Sciatica, left side: Secondary | ICD-10-CM | POA: Diagnosis not present

## 2021-08-24 DIAGNOSIS — M25551 Pain in right hip: Secondary | ICD-10-CM | POA: Diagnosis not present

## 2021-08-24 DIAGNOSIS — M25552 Pain in left hip: Secondary | ICD-10-CM | POA: Diagnosis not present

## 2021-08-25 DIAGNOSIS — M545 Low back pain, unspecified: Secondary | ICD-10-CM | POA: Diagnosis not present

## 2021-08-25 DIAGNOSIS — M47817 Spondylosis without myelopathy or radiculopathy, lumbosacral region: Secondary | ICD-10-CM | POA: Diagnosis not present

## 2021-08-25 DIAGNOSIS — M5136 Other intervertebral disc degeneration, lumbar region: Secondary | ICD-10-CM | POA: Diagnosis not present

## 2021-08-25 DIAGNOSIS — M47816 Spondylosis without myelopathy or radiculopathy, lumbar region: Secondary | ICD-10-CM | POA: Diagnosis not present

## 2021-08-25 DIAGNOSIS — M5137 Other intervertebral disc degeneration, lumbosacral region: Secondary | ICD-10-CM | POA: Diagnosis not present

## 2021-08-25 DIAGNOSIS — M5459 Other low back pain: Secondary | ICD-10-CM | POA: Diagnosis not present

## 2021-08-31 ENCOUNTER — Encounter: Payer: Self-pay | Admitting: Gastroenterology

## 2021-09-03 DIAGNOSIS — Z1231 Encounter for screening mammogram for malignant neoplasm of breast: Secondary | ICD-10-CM | POA: Diagnosis not present

## 2021-09-07 DIAGNOSIS — S335XXA Sprain of ligaments of lumbar spine, initial encounter: Secondary | ICD-10-CM | POA: Diagnosis not present

## 2021-09-13 DIAGNOSIS — S335XXA Sprain of ligaments of lumbar spine, initial encounter: Secondary | ICD-10-CM | POA: Diagnosis not present

## 2021-09-16 DIAGNOSIS — S335XXA Sprain of ligaments of lumbar spine, initial encounter: Secondary | ICD-10-CM | POA: Diagnosis not present

## 2021-09-17 ENCOUNTER — Ambulatory Visit: Payer: Medicare HMO | Admitting: *Deleted

## 2021-09-21 DIAGNOSIS — S335XXA Sprain of ligaments of lumbar spine, initial encounter: Secondary | ICD-10-CM | POA: Diagnosis not present

## 2021-09-23 DIAGNOSIS — S335XXA Sprain of ligaments of lumbar spine, initial encounter: Secondary | ICD-10-CM | POA: Diagnosis not present

## 2021-09-27 DIAGNOSIS — S335XXA Sprain of ligaments of lumbar spine, initial encounter: Secondary | ICD-10-CM | POA: Diagnosis not present

## 2021-10-05 DIAGNOSIS — S335XXA Sprain of ligaments of lumbar spine, initial encounter: Secondary | ICD-10-CM | POA: Diagnosis not present

## 2021-10-11 DIAGNOSIS — S335XXA Sprain of ligaments of lumbar spine, initial encounter: Secondary | ICD-10-CM | POA: Diagnosis not present

## 2021-10-15 ENCOUNTER — Other Ambulatory Visit: Payer: Self-pay

## 2021-11-08 DIAGNOSIS — E119 Type 2 diabetes mellitus without complications: Secondary | ICD-10-CM | POA: Diagnosis not present

## 2021-11-08 DIAGNOSIS — I10 Essential (primary) hypertension: Secondary | ICD-10-CM | POA: Diagnosis not present

## 2021-11-08 DIAGNOSIS — R32 Unspecified urinary incontinence: Secondary | ICD-10-CM | POA: Diagnosis not present

## 2021-11-08 DIAGNOSIS — K429 Umbilical hernia without obstruction or gangrene: Secondary | ICD-10-CM | POA: Diagnosis not present

## 2021-11-08 DIAGNOSIS — B372 Candidiasis of skin and nail: Secondary | ICD-10-CM | POA: Diagnosis not present

## 2021-11-08 DIAGNOSIS — R600 Localized edema: Secondary | ICD-10-CM | POA: Diagnosis not present

## 2021-11-08 DIAGNOSIS — Z23 Encounter for immunization: Secondary | ICD-10-CM | POA: Diagnosis not present

## 2021-11-17 DIAGNOSIS — K429 Umbilical hernia without obstruction or gangrene: Secondary | ICD-10-CM | POA: Diagnosis not present

## 2021-11-23 ENCOUNTER — Telehealth: Payer: Self-pay

## 2021-11-23 ENCOUNTER — Other Ambulatory Visit: Payer: Self-pay

## 2021-11-23 ENCOUNTER — Ambulatory Visit: Payer: Medicare HMO | Admitting: Gastroenterology

## 2021-11-23 ENCOUNTER — Encounter: Payer: Self-pay | Admitting: Gastroenterology

## 2021-11-23 VITALS — BP 132/66 | HR 95 | Ht 64.0 in | Wt 218.6 lb

## 2021-11-23 DIAGNOSIS — K429 Umbilical hernia without obstruction or gangrene: Secondary | ICD-10-CM

## 2021-11-23 DIAGNOSIS — K7460 Unspecified cirrhosis of liver: Secondary | ICD-10-CM

## 2021-11-23 NOTE — Progress Notes (Signed)
    History of Present Illness: This is an 86 year old female with compensated cirrhosis presumed secondary to NASH.  Last office visit was April 2021. RUQ [name] in June 2022 was stable without focal liver lesions.  Blood work performed in June 2022 was stable. EGD in September 2019 was normal, no evidence of varices or portal gastropathy.  Patient relates that her umbilical hernia has been unable to reduce for the past 8 months.  Over the past few months she has noted occasional crampy pain at the hernia.  It has been tender to the touch.  She was evaluated by Dr. Tally Due on November 23 and umbilical hernia repair was scheduled.  Current Medications, Allergies, Past Medical History, Past Surgical History, Family History and Social History were reviewed in [employer] record.   Physical Exam: General: Well developed, well nourished, no acute distress Head: Normocephalic and atraumatic Eyes: Sclerae anicteric, EOMI Ears: Normal auditory acuity Mouth: Not examined, mask on during Covid-19 pandemic Lungs: Clear throughout to auscultation Heart: Regular rate and rhythm; no murmurs, rubs or bruits Abdomen: Soft, non tender and non distended. No masses, hepatosplenomegaly noted.  Non-reducible, tender umbilical hernia. Normal Bowel sounds Rectal: Not done Musculoskeletal: Symmetrical with no gross deformities  Pulses:  Normal pulses noted Extremities: No clubbing, cyanosis, edema or deformities noted Neurological: Alert oriented x 4, grossly nonfocal Psychological:  Alert and cooperative. Normal mood and affect   Assessment and Recommendations:  Compensated cirrhosis presumed secondary to NASH. Schedule hepatic elastography to further assess her risk of having varices. If her risk is low we can defer EGD to screen for varices. MELD=7 from June 2022 blood work. Repeat CBC, CMP, PT/INR to determine her current MELD. A low MELD indicates that the risk of hepatic  decompensation with anesthesia, abdominal surgery is low.  REV in 6 months.  Non-reducible umbilical hernia.  Robotic umbilical hernia repair with mesh is scheduled at [facility] on December 12 with Dr. Tally Due.

## 2021-11-23 NOTE — Telephone Encounter (Signed)
-----   Message from Ladene Artist, MD sent at 11/23/2021 11:24 AM EST ----- Please ask her to come in for CBC, CMP, PT/INR to determine her current MELD. Her prior blood work was from June 2022 and we need current blood work to determine her current MELD which will better assess the risk hepatic decompensation with abdominal surgery.

## 2021-11-23 NOTE — Telephone Encounter (Signed)
Patient will come by this week to the lab to have blood drawn.

## 2021-11-23 NOTE — Patient Instructions (Signed)
You have been scheduled for an abdominal ultrasound with elasto graphy at [facility] Radiology (1st floor of hospital) on 12/02/21 at 9:00am . Please arrive 15 minutes prior to your appointment for registration. Make certain not to have anything to eat or drink after midnight prior to your appointment. Should you need to reschedule please contact the radiology department at [phone].  The [place] [facility] providers would like to encourage you to use [facility] to communicate with providers for non-urgent requests or questions.  Due to long hold times on the telephone, sending your provider a message by [facility] may be a faster and more efficient way to get a response.  Please allow 48 business hours for a response.  Please remember that this is for non-urgent requests.   Due to recent changes in healthcare laws, you may see the results of your imaging and laboratory studies on MyChart before your provider has had a chance to review them.  We understand that in some cases there may be results that are confusing or concerning to you. Not all laboratory results come back in the same time frame and the provider may be waiting for multiple results in order to interpret others.  Please give [name] 48 hours in order for your provider to thoroughly review all the results before contacting the office for clarification of your results.   Thank you for choosing me and [facility].  Pricilla Riffle. Dagoberto Ligas., MD., [place] Regal

## 2021-11-25 ENCOUNTER — Other Ambulatory Visit (INDEPENDENT_AMBULATORY_CARE_PROVIDER_SITE_OTHER): Payer: Medicare HMO

## 2021-11-25 DIAGNOSIS — K7460 Unspecified cirrhosis of liver: Secondary | ICD-10-CM

## 2021-11-25 LAB — CBC WITH DIFFERENTIAL/PLATELET
Basophils Absolute: 0 10*3/uL (ref 0.0–0.1)
Basophils Relative: 0.6 % (ref 0.0–3.0)
Eosinophils Absolute: 0.1 10*3/uL (ref 0.0–0.7)
Eosinophils Relative: 1.5 % (ref 0.0–5.0)
HCT: 38.8 % (ref 36.0–46.0)
Hemoglobin: 12.9 g/dL (ref 12.0–15.0)
Lymphocytes Relative: 14.9 % (ref 12.0–46.0)
Lymphs Abs: 1 10*3/uL (ref 0.7–4.0)
MCHC: 33.2 g/dL (ref 30.0–36.0)
MCV: 94.6 fl (ref 78.0–100.0)
Monocytes Absolute: 0.8 10*3/uL (ref 0.1–1.0)
Monocytes Relative: 11.7 % (ref 3.0–12.0)
Neutro Abs: 4.9 10*3/uL (ref 1.4–7.7)
Neutrophils Relative %: 71.3 % (ref 43.0–77.0)
Platelets: 181 10*3/uL (ref 150.0–400.0)
RBC: 4.11 Mil/uL (ref 3.87–5.11)
RDW: 13.8 % (ref 11.5–15.5)
WBC: 6.8 10*3/uL (ref 4.0–10.5)

## 2021-11-25 LAB — COMPREHENSIVE METABOLIC PANEL
ALT: 13 U/L (ref 0–35)
AST: 24 U/L (ref 0–37)
Albumin: 3.9 g/dL (ref 3.5–5.2)
Alkaline Phosphatase: 97 U/L (ref 39–117)
BUN: 19 mg/dL (ref 6–23)
CO2: 27 mEq/L (ref 19–32)
Calcium: 10.2 mg/dL (ref 8.4–10.5)
Chloride: 106 mEq/L (ref 96–112)
Creatinine, Ser: 1.05 mg/dL (ref 0.40–1.20)
GFR: 48.89 mL/min — ABNORMAL LOW (ref >60.00)
Glucose, Bld: 125 mg/dL — ABNORMAL HIGH (ref 70–99)
Potassium: 4.2 mEq/L (ref 3.5–5.1)
Sodium: 143 mEq/L (ref 135–145)
Total Bilirubin: 1 mg/dL (ref 0.2–1.2)
Total Protein: 7.4 g/dL (ref 6.0–8.3)

## 2021-11-25 LAB — PROTIME-INR
INR: 1.1 ratio — ABNORMAL HIGH (ref 0.8–1.0)
Prothrombin Time: 11.8 s (ref 9.6–13.1)

## 2021-12-01 DIAGNOSIS — K429 Umbilical hernia without obstruction or gangrene: Secondary | ICD-10-CM | POA: Diagnosis not present

## 2021-12-01 DIAGNOSIS — Z79899 Other long term (current) drug therapy: Secondary | ICD-10-CM | POA: Diagnosis not present

## 2021-12-01 DIAGNOSIS — Z01812 Encounter for preprocedural laboratory examination: Secondary | ICD-10-CM | POA: Diagnosis not present

## 2021-12-02 ENCOUNTER — Other Ambulatory Visit: Payer: Self-pay

## 2021-12-02 ENCOUNTER — Ambulatory Visit (HOSPITAL_COMMUNITY)
Admission: RE | Admit: 2021-12-02 | Discharge: 2021-12-02 | Disposition: A | Discharge status: Home / Self Care | Payer: Medicare HMO | Source: Ambulatory Visit | Attending: Gastroenterology | Admitting: Gastroenterology

## 2021-12-02 DIAGNOSIS — K7460 Unspecified cirrhosis of liver: Secondary | ICD-10-CM | POA: Diagnosis not present

## 2021-12-02 DIAGNOSIS — K8020 Calculus of gallbladder without cholecystitis without obstruction: Secondary | ICD-10-CM | POA: Diagnosis not present

## 2021-12-08 DIAGNOSIS — E119 Type 2 diabetes mellitus without complications: Secondary | ICD-10-CM | POA: Diagnosis not present

## 2021-12-08 DIAGNOSIS — Z79899 Other long term (current) drug therapy: Secondary | ICD-10-CM | POA: Diagnosis not present

## 2021-12-08 DIAGNOSIS — Z91041 Radiographic dye allergy status: Secondary | ICD-10-CM | POA: Diagnosis not present

## 2021-12-08 DIAGNOSIS — K7460 Unspecified cirrhosis of liver: Secondary | ICD-10-CM | POA: Diagnosis not present

## 2021-12-08 DIAGNOSIS — Z888 Allergy status to other drugs, medicaments and biological substances status: Secondary | ICD-10-CM | POA: Diagnosis not present

## 2021-12-08 DIAGNOSIS — E669 Obesity, unspecified: Secondary | ICD-10-CM | POA: Diagnosis not present

## 2021-12-08 DIAGNOSIS — Z881 Allergy status to other antibiotic agents status: Secondary | ICD-10-CM | POA: Diagnosis not present

## 2021-12-08 DIAGNOSIS — K429 Umbilical hernia without obstruction or gangrene: Secondary | ICD-10-CM | POA: Diagnosis not present

## 2021-12-08 DIAGNOSIS — E785 Hyperlipidemia, unspecified: Secondary | ICD-10-CM | POA: Diagnosis not present

## 2021-12-08 DIAGNOSIS — I10 Essential (primary) hypertension: Secondary | ICD-10-CM | POA: Diagnosis not present

## 2021-12-08 DIAGNOSIS — Z6839 Body mass index (BMI) 39.0-39.9, adult: Secondary | ICD-10-CM | POA: Diagnosis not present

## 2021-12-08 DIAGNOSIS — Z7984 Long term (current) use of oral hypoglycemic drugs: Secondary | ICD-10-CM | POA: Diagnosis not present

## 2021-12-15 ENCOUNTER — Other Ambulatory Visit: Payer: Self-pay | Admitting: *Deleted

## 2021-12-15 NOTE — Patient Outreach (Signed)
[facility]) Care Management  12/15/2021  ADAORA MCHANEY Aug 10, 1935 [id number]  Nurse Health Coach sent patient education regarding high blood sugar and low blood sugar.     Plan: Nurse Health Coach will send patient additional diabetes education within the month of June.  Emelia Loron RN, [place] [phone] [email or web address]

## 2021-12-16 ENCOUNTER — Ambulatory Visit: Payer: Self-pay | Admitting: *Deleted

## 2022-02-18 DIAGNOSIS — E119 Type 2 diabetes mellitus without complications: Secondary | ICD-10-CM | POA: Diagnosis not present

## 2022-02-18 DIAGNOSIS — I10 Essential (primary) hypertension: Secondary | ICD-10-CM | POA: Diagnosis not present

## 2022-02-18 DIAGNOSIS — Z008 Encounter for other general examination: Secondary | ICD-10-CM | POA: Diagnosis not present

## 2022-02-18 DIAGNOSIS — R609 Edema, unspecified: Secondary | ICD-10-CM | POA: Diagnosis not present

## 2022-02-18 DIAGNOSIS — K219 Gastro-esophageal reflux disease without esophagitis: Secondary | ICD-10-CM | POA: Diagnosis not present

## 2022-02-18 DIAGNOSIS — G8929 Other chronic pain: Secondary | ICD-10-CM | POA: Diagnosis not present

## 2022-02-18 DIAGNOSIS — Z7984 Long term (current) use of oral hypoglycemic drugs: Secondary | ICD-10-CM | POA: Diagnosis not present

## 2022-02-18 DIAGNOSIS — M545 Low back pain, unspecified: Secondary | ICD-10-CM | POA: Diagnosis not present

## 2022-02-18 DIAGNOSIS — Z6837 Body mass index (BMI) 37.0-37.9, adult: Secondary | ICD-10-CM | POA: Diagnosis not present

## 2022-02-18 DIAGNOSIS — Z791 Long term (current) use of non-steroidal anti-inflammatories (NSAID): Secondary | ICD-10-CM | POA: Diagnosis not present

## 2022-02-18 DIAGNOSIS — N3941 Urge incontinence: Secondary | ICD-10-CM | POA: Diagnosis not present

## 2022-02-18 DIAGNOSIS — R21 Rash and other nonspecific skin eruption: Secondary | ICD-10-CM | POA: Diagnosis not present

## 2022-02-24 DIAGNOSIS — E119 Type 2 diabetes mellitus without complications: Secondary | ICD-10-CM | POA: Diagnosis not present

## 2022-02-24 DIAGNOSIS — C441122 Basal cell carcinoma of skin of right lower eyelid, including canthus: Secondary | ICD-10-CM | POA: Diagnosis not present

## 2022-02-24 DIAGNOSIS — H04123 Dry eye syndrome of bilateral lacrimal glands: Secondary | ICD-10-CM | POA: Diagnosis not present

## 2022-02-24 DIAGNOSIS — H40013 Open angle with borderline findings, low risk, bilateral: Secondary | ICD-10-CM | POA: Diagnosis not present

## 2022-02-24 LAB — HM DIABETES EYE EXAM

## 2022-02-28 DIAGNOSIS — J168 Pneumonia due to other specified infectious organisms: Secondary | ICD-10-CM | POA: Diagnosis not present

## 2022-03-03 DIAGNOSIS — J168 Pneumonia due to other specified infectious organisms: Secondary | ICD-10-CM | POA: Diagnosis not present

## 2022-03-03 DIAGNOSIS — J90 Pleural effusion, not elsewhere classified: Secondary | ICD-10-CM | POA: Diagnosis not present

## 2022-03-03 DIAGNOSIS — E119 Type 2 diabetes mellitus without complications: Secondary | ICD-10-CM | POA: Diagnosis not present

## 2022-03-03 DIAGNOSIS — Z20822 Contact with and (suspected) exposure to covid-19: Secondary | ICD-10-CM | POA: Diagnosis not present

## 2022-03-03 DIAGNOSIS — I440 Atrioventricular block, first degree: Secondary | ICD-10-CM | POA: Diagnosis not present

## 2022-03-03 DIAGNOSIS — R918 Other nonspecific abnormal finding of lung field: Secondary | ICD-10-CM | POA: Diagnosis not present

## 2022-03-04 DIAGNOSIS — J189 Pneumonia, unspecified organism: Secondary | ICD-10-CM | POA: Diagnosis not present

## 2022-03-04 DIAGNOSIS — Z91041 Radiographic dye allergy status: Secondary | ICD-10-CM | POA: Diagnosis not present

## 2022-03-04 DIAGNOSIS — K7460 Unspecified cirrhosis of liver: Secondary | ICD-10-CM | POA: Diagnosis not present

## 2022-03-04 DIAGNOSIS — Z881 Allergy status to other antibiotic agents status: Secondary | ICD-10-CM | POA: Diagnosis not present

## 2022-03-04 DIAGNOSIS — Z888 Allergy status to other drugs, medicaments and biological substances status: Secondary | ICD-10-CM | POA: Diagnosis not present

## 2022-03-04 DIAGNOSIS — E1165 Type 2 diabetes mellitus with hyperglycemia: Secondary | ICD-10-CM | POA: Diagnosis not present

## 2022-03-04 DIAGNOSIS — J168 Pneumonia due to other specified infectious organisms: Secondary | ICD-10-CM | POA: Diagnosis not present

## 2022-03-04 DIAGNOSIS — I517 Cardiomegaly: Secondary | ICD-10-CM | POA: Diagnosis not present

## 2022-03-04 DIAGNOSIS — E785 Hyperlipidemia, unspecified: Secondary | ICD-10-CM | POA: Diagnosis not present

## 2022-03-04 DIAGNOSIS — Z96653 Presence of artificial knee joint, bilateral: Secondary | ICD-10-CM | POA: Diagnosis not present

## 2022-03-04 DIAGNOSIS — Z90710 Acquired absence of both cervix and uterus: Secondary | ICD-10-CM | POA: Diagnosis not present

## 2022-03-04 DIAGNOSIS — I440 Atrioventricular block, first degree: Secondary | ICD-10-CM | POA: Diagnosis not present

## 2022-03-04 DIAGNOSIS — Y92009 Unspecified place in unspecified non-institutional (private) residence as the place of occurrence of the external cause: Secondary | ICD-10-CM | POA: Diagnosis not present

## 2022-03-04 DIAGNOSIS — Z9851 Tubal ligation status: Secondary | ICD-10-CM | POA: Diagnosis not present

## 2022-03-04 DIAGNOSIS — R7989 Other specified abnormal findings of blood chemistry: Secondary | ICD-10-CM | POA: Diagnosis not present

## 2022-03-04 DIAGNOSIS — Z7984 Long term (current) use of oral hypoglycemic drugs: Secondary | ICD-10-CM | POA: Diagnosis not present

## 2022-03-04 DIAGNOSIS — I081 Rheumatic disorders of both mitral and tricuspid valves: Secondary | ICD-10-CM | POA: Diagnosis not present

## 2022-03-04 DIAGNOSIS — I10 Essential (primary) hypertension: Secondary | ICD-10-CM | POA: Diagnosis not present

## 2022-03-04 DIAGNOSIS — T380X5A Adverse effect of glucocorticoids and synthetic analogues, initial encounter: Secondary | ICD-10-CM | POA: Diagnosis not present

## 2022-03-04 DIAGNOSIS — Z20822 Contact with and (suspected) exposure to covid-19: Secondary | ICD-10-CM | POA: Diagnosis not present

## 2022-03-06 DIAGNOSIS — J189 Pneumonia, unspecified organism: Secondary | ICD-10-CM | POA: Diagnosis not present

## 2022-03-14 DIAGNOSIS — J168 Pneumonia due to other specified infectious organisms: Secondary | ICD-10-CM | POA: Diagnosis not present

## 2022-05-02 DIAGNOSIS — R600 Localized edema: Secondary | ICD-10-CM | POA: Diagnosis not present

## 2022-05-02 DIAGNOSIS — R5383 Other fatigue: Secondary | ICD-10-CM | POA: Diagnosis not present

## 2022-05-02 DIAGNOSIS — E119 Type 2 diabetes mellitus without complications: Secondary | ICD-10-CM | POA: Diagnosis not present

## 2022-06-06 ENCOUNTER — Other Ambulatory Visit: Payer: Self-pay

## 2022-06-06 DIAGNOSIS — K7460 Unspecified cirrhosis of liver: Secondary | ICD-10-CM

## 2022-06-08 ENCOUNTER — Other Ambulatory Visit: Payer: Self-pay | Admitting: *Deleted

## 2022-06-08 NOTE — Patient Outreach (Signed)
[place] [facility]) Care Management  06/08/2022  BLAYRE PAPANIA 1935-05-11 [id number]  RN Health Coach closed patient's case. A case closed letter was sent to patient. [facility] has discontinued the educational six month mailing program.   Emelia Loron RN, [place] [phone] [email or web address]

## 2022-06-15 ENCOUNTER — Ambulatory Visit: Payer: Medicare HMO | Admitting: *Deleted

## 2022-06-16 DIAGNOSIS — D1801 Hemangioma of skin and subcutaneous tissue: Secondary | ICD-10-CM | POA: Diagnosis not present

## 2022-06-16 DIAGNOSIS — L821 Other seborrheic keratosis: Secondary | ICD-10-CM | POA: Diagnosis not present

## 2022-06-16 DIAGNOSIS — L814 Other melanin hyperpigmentation: Secondary | ICD-10-CM | POA: Diagnosis not present

## 2022-06-16 DIAGNOSIS — Z85828 Personal history of other malignant neoplasm of skin: Secondary | ICD-10-CM | POA: Diagnosis not present

## 2022-06-16 DIAGNOSIS — L570 Actinic keratosis: Secondary | ICD-10-CM | POA: Diagnosis not present

## 2022-06-16 DIAGNOSIS — L820 Inflamed seborrheic keratosis: Secondary | ICD-10-CM | POA: Diagnosis not present

## 2022-06-16 DIAGNOSIS — L218 Other seborrheic dermatitis: Secondary | ICD-10-CM | POA: Diagnosis not present

## 2022-06-20 ENCOUNTER — Ambulatory Visit (HOSPITAL_COMMUNITY)
Admission: RE | Admit: 2022-06-20 | Discharge: 2022-06-20 | Disposition: A | Discharge status: Home / Self Care | Payer: Medicare HMO | Source: Ambulatory Visit | Attending: Gastroenterology | Admitting: Gastroenterology

## 2022-06-20 DIAGNOSIS — K8020 Calculus of gallbladder without cholecystitis without obstruction: Secondary | ICD-10-CM | POA: Diagnosis not present

## 2022-06-20 DIAGNOSIS — K7460 Unspecified cirrhosis of liver: Secondary | ICD-10-CM | POA: Insufficient documentation

## 2022-08-15 DIAGNOSIS — I10 Essential (primary) hypertension: Secondary | ICD-10-CM | POA: Diagnosis not present

## 2022-08-15 DIAGNOSIS — E119 Type 2 diabetes mellitus without complications: Secondary | ICD-10-CM | POA: Diagnosis not present

## 2022-08-15 DIAGNOSIS — R600 Localized edema: Secondary | ICD-10-CM | POA: Diagnosis not present

## 2022-08-15 DIAGNOSIS — R5383 Other fatigue: Secondary | ICD-10-CM | POA: Diagnosis not present

## 2022-08-23 DIAGNOSIS — R600 Localized edema: Secondary | ICD-10-CM | POA: Diagnosis not present

## 2022-08-23 DIAGNOSIS — M7989 Other specified soft tissue disorders: Secondary | ICD-10-CM | POA: Diagnosis not present

## 2022-09-28 DIAGNOSIS — L570 Actinic keratosis: Secondary | ICD-10-CM | POA: Diagnosis not present

## 2022-09-28 DIAGNOSIS — L820 Inflamed seborrheic keratosis: Secondary | ICD-10-CM | POA: Diagnosis not present

## 2022-09-28 DIAGNOSIS — Z85828 Personal history of other malignant neoplasm of skin: Secondary | ICD-10-CM | POA: Diagnosis not present

## 2022-10-19 DIAGNOSIS — J0140 Acute pansinusitis, unspecified: Secondary | ICD-10-CM | POA: Diagnosis not present

## 2022-11-02 DIAGNOSIS — J0140 Acute pansinusitis, unspecified: Secondary | ICD-10-CM | POA: Diagnosis not present

## 2022-11-02 DIAGNOSIS — R600 Localized edema: Secondary | ICD-10-CM | POA: Diagnosis not present

## 2022-11-02 DIAGNOSIS — R32 Unspecified urinary incontinence: Secondary | ICD-10-CM | POA: Diagnosis not present

## 2022-11-30 DIAGNOSIS — U071 COVID-19: Secondary | ICD-10-CM | POA: Diagnosis not present

## 2022-11-30 DIAGNOSIS — J1569 Pneumonia due to other gram-negative bacteria: Secondary | ICD-10-CM | POA: Diagnosis not present

## 2022-11-30 DIAGNOSIS — Z20822 Contact with and (suspected) exposure to covid-19: Secondary | ICD-10-CM | POA: Diagnosis not present

## 2022-12-20 ENCOUNTER — Telehealth: Payer: Self-pay

## 2022-12-20 ENCOUNTER — Other Ambulatory Visit: Payer: Self-pay

## 2022-12-20 DIAGNOSIS — K7460 Unspecified cirrhosis of liver: Secondary | ICD-10-CM

## 2022-12-20 NOTE — Telephone Encounter (Signed)
Spoke with patient & reminded her of 6 month follow up RUQ [name] as previously recommended by Dr. Fuller Plan. Orders placed & schedulers notified. She's been provided radiology number & advised to contact for scheduling if she has not heard from them within 1 week. Pt verbalized all understanding.

## 2022-12-20 NOTE — Telephone Encounter (Signed)
-----   Message from Carl Best, RN sent at 06/20/2022 12:55 PM EDT ----- Pt needs RUQ 6 months.

## 2022-12-27 DIAGNOSIS — H903 Sensorineural hearing loss, bilateral: Secondary | ICD-10-CM | POA: Diagnosis not present

## 2022-12-28 ENCOUNTER — Other Ambulatory Visit: Payer: Self-pay

## 2022-12-28 ENCOUNTER — Ambulatory Visit (HOSPITAL_COMMUNITY)
Admission: RE | Admit: 2022-12-28 | Discharge: 2022-12-28 | Disposition: A | Discharge status: Home / Self Care | Payer: Medicare HMO | Source: Ambulatory Visit | Attending: Gastroenterology | Admitting: Gastroenterology

## 2022-12-28 DIAGNOSIS — K7460 Unspecified cirrhosis of liver: Secondary | ICD-10-CM | POA: Diagnosis not present

## 2022-12-28 DIAGNOSIS — K8020 Calculus of gallbladder without cholecystitis without obstruction: Secondary | ICD-10-CM | POA: Diagnosis not present

## 2022-12-29 DIAGNOSIS — N3941 Urge incontinence: Secondary | ICD-10-CM | POA: Diagnosis not present

## 2022-12-29 DIAGNOSIS — N393 Stress incontinence (female) (male): Secondary | ICD-10-CM | POA: Diagnosis not present

## 2023-01-11 ENCOUNTER — Other Ambulatory Visit (INDEPENDENT_AMBULATORY_CARE_PROVIDER_SITE_OTHER): Payer: Medicare HMO

## 2023-01-11 DIAGNOSIS — K7460 Unspecified cirrhosis of liver: Secondary | ICD-10-CM | POA: Diagnosis not present

## 2023-01-11 LAB — CBC WITH DIFFERENTIAL/PLATELET
Basophils Absolute: 0 10*3/uL (ref 0.0–0.1)
Basophils Relative: 0.6 % (ref 0.0–3.0)
Eosinophils Absolute: 0.1 10*3/uL (ref 0.0–0.7)
Eosinophils Relative: 1.4 % (ref 0.0–5.0)
HCT: 37.4 % (ref 36.0–46.0)
Hemoglobin: 12.6 g/dL (ref 12.0–15.0)
Lymphocytes Relative: 13.6 % (ref 12.0–46.0)
Lymphs Abs: 1.1 10*3/uL (ref 0.7–4.0)
MCHC: 33.6 g/dL (ref 30.0–36.0)
MCV: 95.4 fl (ref 78.0–100.0)
Monocytes Absolute: 0.8 10*3/uL (ref 0.1–1.0)
Monocytes Relative: 9.9 % (ref 3.0–12.0)
Neutro Abs: 6 10*3/uL (ref 1.4–7.7)
Neutrophils Relative %: 74.5 % (ref 43.0–77.0)
Platelets: 186 10*3/uL (ref 150.0–400.0)
RBC: 3.92 Mil/uL (ref 3.87–5.11)
RDW: 14.3 % (ref 11.5–15.5)
WBC: 8 10*3/uL (ref 4.0–10.5)

## 2023-01-11 LAB — COMPREHENSIVE METABOLIC PANEL
ALT: 13 U/L (ref 0–35)
AST: 29 U/L (ref 0–37)
Albumin: 3.7 g/dL (ref 3.5–5.2)
Alkaline Phosphatase: 122 U/L — ABNORMAL HIGH (ref 39–117)
BUN: 12 mg/dL (ref 6–23)
CO2: 26 mEq/L (ref 19–32)
Calcium: 9.8 mg/dL (ref 8.4–10.5)
Chloride: 107 mEq/L (ref 96–112)
Creatinine, Ser: 0.88 mg/dL (ref 0.40–1.20)
GFR: 59.96 mL/min — ABNORMAL LOW (ref >60.00)
Glucose, Bld: 129 mg/dL — ABNORMAL HIGH (ref 70–99)
Potassium: 4.2 mEq/L (ref 3.5–5.1)
Sodium: 143 mEq/L (ref 135–145)
Total Bilirubin: 0.8 mg/dL (ref 0.2–1.2)
Total Protein: 7.2 g/dL (ref 6.0–8.3)

## 2023-01-11 LAB — PROTIME-INR
INR: 1.1 ratio — ABNORMAL HIGH (ref 0.8–1.0)
Prothrombin Time: 11.6 s (ref 9.6–13.1)

## 2023-01-12 LAB — AFP TUMOR MARKER: AFP-Tumor Marker: 4.2 ng/mL

## 2023-01-16 ENCOUNTER — Encounter: Payer: Self-pay | Admitting: Gastroenterology

## 2023-01-16 ENCOUNTER — Ambulatory Visit: Payer: Medicare HMO | Admitting: Gastroenterology

## 2023-01-16 VITALS — BP 130/69 | HR 65 | Ht 64.0 in | Wt 220.0 lb

## 2023-01-16 DIAGNOSIS — K7460 Unspecified cirrhosis of liver: Secondary | ICD-10-CM

## 2023-01-16 NOTE — Patient Instructions (Addendum)
We will contact in July for you to have repeat labs and ultrasound scheduled.   The [place] [facility] providers would like to encourage you to use [facility] to communicate with providers for non-urgent requests or questions.  Due to long hold times on the telephone, sending your provider a message by [facility] may be a faster and more efficient way to get a response.  Please allow 48 business hours for a response.  Please remember that this is for non-urgent requests.   Thank you for choosing me and [facility].  Pricilla Riffle. Dagoberto Ligas., MD., [place] Regal

## 2023-01-16 NOTE — Progress Notes (Signed)
    Assessment     Compensated cirrhosis presumed secondary to NASH. MELD=7 S/P umbilical hernia repair Cholelithiasis, asymptomatic DM   Recommendations    RUQ [name], CBC, CMP, PT/INR, AFP in 6 months REV in 1 year   HPI    This is an 88 year old female returning for follow-up of compensated cirrhosis.  She has no gastrointestinal complaints.  No active GI symptoms.    Labs / Imaging       Latest Ref Rng & Units 01/11/2023    9:40 AM 11/25/2021   10:23 AM 06/14/2021    8:40 AM  Hepatic Function  Total Protein 6.0 - 8.3 g/dL 7.2  7.4  7.4   Albumin 3.5 - 5.2 g/dL 3.7  3.9  4.0   AST 0 - 37 U/L '29  24  29   '$ ALT 0 - 35 U/L '13  13  14   '$ Alk Phosphatase 39 - 117 U/L 122  97  94   Total Bilirubin 0.2 - 1.2 mg/dL 0.8  1.0  0.8        Latest Ref Rng & Units 01/11/2023    9:40 AM 11/25/2021   10:23 AM 06/14/2021    8:40 AM  CBC  WBC 4.0 - 10.5 K/uL 8.0  6.8  7.1   Hemoglobin 12.0 - 15.0 g/dL 12.6  12.9  12.8   Hematocrit 36.0 - 46.0 % 37.4  38.8  38.3   Platelets 150.0 - 400.0 K/uL 186.0  181.0  173.0    INR=1.1 stable AFP=4.2 stable  US Abdomen Limited RUQ (LIVER/GB)  Result Date: 12/28/2022 CLINICAL DATA:  History of cirrhosis. Screening for hepatocellular carcinoma. EXAM: ULTRASOUND ABDOMEN LIMITED RIGHT UPPER QUADRANT COMPARISON:  June 20, 2022 FINDINGS: Gallbladder: A 2.9 cm stone is identified in the gallbladder. Mild wall thickening measuring 4.7 mm is identified. No Murphy's sign. No other abnormalities. Common bile duct: Diameter: 6.2 mm Liver: Heterogeneous and echogenic echotexture. Nodular contour. No suspicious mass. Portal vein is patent on color Doppler imaging with normal direction of blood flow towards the liver. Other: None. IMPRESSION: 1. Cirrhotic morphology in the liver.  No mass identified. 2. The common bile duct is borderline in caliber. Recommend correlation with LFTs. 3. Cholelithiasis and mild gallbladder wall thickening. No Murphy's sign or  pericholecystic fluid. Recommend clinical correlation. Electronically Signed   By: Dorise Bullion III M.D.   On: 12/28/2022 10:04    Current Medications, Allergies, Past Medical History, Past Surgical History, Family History and Social History were reviewed in [employer] record.   Physical Exam: General: Well developed, well nourished, no acute distress Head: Normocephalic and atraumatic Eyes: Sclerae anicteric, EOMI Ears: Normal auditory acuity Mouth: No deformities or lesions noted Lungs: Clear throughout to auscultation Heart: Regular rate and rhythm; No murmurs, rubs or bruits Abdomen: Soft, non tender and non distended. No masses, hepatosplenomegaly or hernias noted. Normal Bowel sounds Rectal: Not done Musculoskeletal: Symmetrical with no gross deformities  Pulses:  Normal pulses noted Extremities: No edema or deformities noted Neurological: Alert oriented x 4, grossly nonfocal Psychological:  Alert and cooperative. Normal mood and affect   Stacey Cooper Plan, MD 01/16/2023, 9:29 AM

## 2023-02-23 DIAGNOSIS — R69 Illness, unspecified: Secondary | ICD-10-CM | POA: Diagnosis not present

## 2023-02-23 DIAGNOSIS — R5382 Chronic fatigue, unspecified: Secondary | ICD-10-CM | POA: Diagnosis not present

## 2023-02-23 DIAGNOSIS — E1165 Type 2 diabetes mellitus with hyperglycemia: Secondary | ICD-10-CM | POA: Diagnosis not present

## 2023-02-23 DIAGNOSIS — B372 Candidiasis of skin and nail: Secondary | ICD-10-CM | POA: Diagnosis not present

## 2023-05-02 DIAGNOSIS — J208 Acute bronchitis due to other specified organisms: Secondary | ICD-10-CM | POA: Diagnosis not present

## 2023-05-02 DIAGNOSIS — B9689 Other specified bacterial agents as the cause of diseases classified elsewhere: Secondary | ICD-10-CM | POA: Diagnosis not present

## 2023-05-09 DIAGNOSIS — I10 Essential (primary) hypertension: Secondary | ICD-10-CM | POA: Diagnosis not present

## 2023-05-09 DIAGNOSIS — J189 Pneumonia, unspecified organism: Secondary | ICD-10-CM | POA: Diagnosis not present

## 2023-05-09 DIAGNOSIS — R0981 Nasal congestion: Secondary | ICD-10-CM | POA: Diagnosis not present

## 2023-05-09 DIAGNOSIS — J209 Acute bronchitis, unspecified: Secondary | ICD-10-CM | POA: Diagnosis not present

## 2023-05-09 DIAGNOSIS — J1569 Pneumonia due to other gram-negative bacteria: Secondary | ICD-10-CM | POA: Diagnosis not present

## 2023-05-09 DIAGNOSIS — E119 Type 2 diabetes mellitus without complications: Secondary | ICD-10-CM | POA: Diagnosis not present

## 2023-05-09 DIAGNOSIS — J40 Bronchitis, not specified as acute or chronic: Secondary | ICD-10-CM | POA: Diagnosis not present

## 2023-05-09 DIAGNOSIS — I440 Atrioventricular block, first degree: Secondary | ICD-10-CM | POA: Diagnosis not present

## 2023-05-09 DIAGNOSIS — K7460 Unspecified cirrhosis of liver: Secondary | ICD-10-CM | POA: Diagnosis not present

## 2023-05-09 DIAGNOSIS — E785 Hyperlipidemia, unspecified: Secondary | ICD-10-CM | POA: Diagnosis not present

## 2023-05-09 DIAGNOSIS — R059 Cough, unspecified: Secondary | ICD-10-CM | POA: Diagnosis not present

## 2023-05-09 DIAGNOSIS — R9431 Abnormal electrocardiogram [ECG] [EKG]: Secondary | ICD-10-CM | POA: Diagnosis not present

## 2023-05-09 DIAGNOSIS — R0602 Shortness of breath: Secondary | ICD-10-CM | POA: Diagnosis not present

## 2023-05-10 DIAGNOSIS — J189 Pneumonia, unspecified organism: Secondary | ICD-10-CM | POA: Diagnosis not present

## 2023-05-24 DIAGNOSIS — M1712 Unilateral primary osteoarthritis, left knee: Secondary | ICD-10-CM | POA: Diagnosis not present

## 2023-05-24 DIAGNOSIS — M1711 Unilateral primary osteoarthritis, right knee: Secondary | ICD-10-CM | POA: Diagnosis not present

## 2023-05-24 DIAGNOSIS — M170 Bilateral primary osteoarthritis of knee: Secondary | ICD-10-CM | POA: Diagnosis not present

## 2023-06-09 DIAGNOSIS — J189 Pneumonia, unspecified organism: Secondary | ICD-10-CM | POA: Diagnosis not present

## 2023-07-03 ENCOUNTER — Other Ambulatory Visit: Payer: Self-pay

## 2023-07-03 ENCOUNTER — Telehealth: Payer: Self-pay

## 2023-07-03 DIAGNOSIS — K7460 Unspecified cirrhosis of liver: Secondary | ICD-10-CM

## 2023-07-03 NOTE — Telephone Encounter (Signed)
Patient is due for repeat labs (CBC, CMet, PT/INR and AFP) and RUQ abdominal ultrasound. Lab orders put in Epic for patient to come by our lab. Patient scheduled for ultrasound on 07/19/23 at [facility] radiology department at 9:30am. Left message for patient to return my call.

## 2023-07-03 NOTE — Telephone Encounter (Signed)
Informed patient of ultrasound appt and to come by our lab in the basement for labs the same day. Patient verbalized understanding. Gave patient to phone number to radiology in case she wants to reschedule.

## 2023-07-09 DIAGNOSIS — J189 Pneumonia, unspecified organism: Secondary | ICD-10-CM | POA: Diagnosis not present

## 2023-07-17 ENCOUNTER — Ambulatory Visit (HOSPITAL_COMMUNITY)
Admission: RE | Admit: 2023-07-17 | Discharge: 2023-07-17 | Disposition: A | Discharge status: Home / Self Care | Payer: Medicare HMO | Source: Ambulatory Visit | Attending: Gastroenterology | Admitting: Gastroenterology

## 2023-07-17 ENCOUNTER — Other Ambulatory Visit (INDEPENDENT_AMBULATORY_CARE_PROVIDER_SITE_OTHER): Payer: Medicare HMO

## 2023-07-17 DIAGNOSIS — K7460 Unspecified cirrhosis of liver: Secondary | ICD-10-CM | POA: Diagnosis not present

## 2023-07-17 DIAGNOSIS — K8020 Calculus of gallbladder without cholecystitis without obstruction: Secondary | ICD-10-CM | POA: Diagnosis not present

## 2023-07-17 DIAGNOSIS — K7689 Other specified diseases of liver: Secondary | ICD-10-CM | POA: Diagnosis not present

## 2023-07-17 LAB — CBC WITH DIFFERENTIAL/PLATELET
Basophils Absolute: 0.1 10*3/uL (ref 0.0–0.1)
Basophils Relative: 0.7 % (ref 0.0–3.0)
Eosinophils Absolute: 0.1 10*3/uL (ref 0.0–0.7)
Eosinophils Relative: 1.4 % (ref 0.0–5.0)
HCT: 41.5 % (ref 36.0–46.0)
Hemoglobin: 13.4 g/dL (ref 12.0–15.0)
Lymphocytes Relative: 13.5 % (ref 12.0–46.0)
Lymphs Abs: 1.1 10*3/uL (ref 0.7–4.0)
MCHC: 32.3 g/dL (ref 30.0–36.0)
MCV: 96.6 fl (ref 78.0–100.0)
Monocytes Absolute: 0.8 10*3/uL (ref 0.1–1.0)
Monocytes Relative: 10.2 % (ref 3.0–12.0)
Neutro Abs: 5.9 10*3/uL (ref 1.4–7.7)
Neutrophils Relative %: 74.2 % (ref 43.0–77.0)
Platelets: 193 10*3/uL (ref 150.0–400.0)
RBC: 4.3 Mil/uL (ref 3.87–5.11)
RDW: 14.2 % (ref 11.5–15.5)
WBC: 7.9 10*3/uL (ref 4.0–10.5)

## 2023-07-17 LAB — PROTIME-INR
INR: 1.2 ratio — ABNORMAL HIGH (ref 0.8–1.0)
Prothrombin Time: 12.4 s (ref 9.6–13.1)

## 2023-07-17 LAB — COMPREHENSIVE METABOLIC PANEL
ALT: 13 U/L (ref 0–35)
AST: 31 U/L (ref 0–37)
Albumin: 3.6 g/dL (ref 3.5–5.2)
Alkaline Phosphatase: 118 U/L — ABNORMAL HIGH (ref 39–117)
BUN: 10 mg/dL (ref 6–23)
CO2: 24 mEq/L (ref 19–32)
Calcium: 9.8 mg/dL (ref 8.4–10.5)
Chloride: 106 mEq/L (ref 96–112)
Creatinine, Ser: 0.92 mg/dL (ref 0.40–1.20)
GFR: 56.64 mL/min — ABNORMAL LOW (ref >60.00)
Glucose, Bld: 132 mg/dL — ABNORMAL HIGH (ref 70–99)
Potassium: 3.9 mEq/L (ref 3.5–5.1)
Sodium: 142 mEq/L (ref 135–145)
Total Bilirubin: 1.3 mg/dL — ABNORMAL HIGH (ref 0.2–1.2)
Total Protein: 7.2 g/dL (ref 6.0–8.3)

## 2023-07-18 ENCOUNTER — Other Ambulatory Visit: Payer: Self-pay

## 2023-07-18 DIAGNOSIS — K862 Cyst of pancreas: Secondary | ICD-10-CM

## 2023-07-18 LAB — AFP TUMOR MARKER: AFP-Tumor Marker: 4.5 ng/mL

## 2023-07-19 ENCOUNTER — Other Ambulatory Visit (HOSPITAL_COMMUNITY): Payer: Medicare HMO

## 2023-07-25 ENCOUNTER — Ambulatory Visit (HOSPITAL_COMMUNITY)
Admission: RE | Admit: 2023-07-25 | Discharge: 2023-07-25 | Disposition: A | Discharge status: Home / Self Care | Payer: Medicare HMO | Source: Ambulatory Visit | Attending: Gastroenterology | Admitting: Gastroenterology

## 2023-07-25 ENCOUNTER — Encounter: Payer: Self-pay | Admitting: Gastroenterology

## 2023-07-25 ENCOUNTER — Other Ambulatory Visit: Payer: Self-pay | Admitting: Gastroenterology

## 2023-07-25 DIAGNOSIS — N281 Cyst of kidney, acquired: Secondary | ICD-10-CM | POA: Diagnosis not present

## 2023-07-25 DIAGNOSIS — K862 Cyst of pancreas: Secondary | ICD-10-CM | POA: Insufficient documentation

## 2023-07-25 DIAGNOSIS — K8020 Calculus of gallbladder without cholecystitis without obstruction: Secondary | ICD-10-CM | POA: Diagnosis not present

## 2023-07-25 DIAGNOSIS — K7460 Unspecified cirrhosis of liver: Secondary | ICD-10-CM | POA: Diagnosis not present

## 2023-07-25 DIAGNOSIS — K828 Other specified diseases of gallbladder: Secondary | ICD-10-CM | POA: Diagnosis not present

## 2023-07-25 MED ORDER — GADOBUTROL 1 MMOL/ML IV SOLN
10.0000 mL | Freq: Once | INTRAVENOUS | Status: AC | PRN
  Administered 2023-07-25: 10 mL via INTRAVENOUS

## 2023-08-01 ENCOUNTER — Telehealth: Payer: Self-pay | Admitting: Gastroenterology

## 2023-08-01 NOTE — Telephone Encounter (Signed)
The pt has been advised that the results have not been reviewed. We will call her as soon as able when reviewed

## 2023-08-01 NOTE — Telephone Encounter (Signed)
PT is calling to discuss MRI results from 7/30. Please advise.

## 2023-08-03 ENCOUNTER — Telehealth: Payer: Self-pay

## 2023-08-03 NOTE — Telephone Encounter (Signed)
Left message on machine to call back  Message sent to call the pt in 2 years

## 2023-08-03 NOTE — Telephone Encounter (Signed)
-----   Message from [place] T. Russella Dar sent at 08/03/2023  9:49 AM EDT ----- Regarding: FW: #Severe motion degraded scan #Cirrhosis  #> 5 pancreatic cystic lesions, one > 3 cm, favor side branch IPMNs #Cholelithiasis  #See impression for other findings   Plan repeat MRI/MRCP in 2 years and reassess health status prior to repeating ----- Message ----- From: Interface, Rad Results In Sent: 07/31/2023   2:02 PM EDT To: Meryl Dare, MD

## 2023-08-03 NOTE — Telephone Encounter (Signed)
The pt has been advised of the results.  She agrees to the MRI in 2 years. She will call if she needs [name] in the meantime.

## 2023-08-05 DIAGNOSIS — Z008 Encounter for other general examination: Secondary | ICD-10-CM | POA: Diagnosis not present

## 2023-08-05 DIAGNOSIS — F419 Anxiety disorder, unspecified: Secondary | ICD-10-CM | POA: Diagnosis not present

## 2023-08-05 DIAGNOSIS — E1142 Type 2 diabetes mellitus with diabetic polyneuropathy: Secondary | ICD-10-CM | POA: Diagnosis not present

## 2023-08-05 DIAGNOSIS — I2510 Atherosclerotic heart disease of native coronary artery without angina pectoris: Secondary | ICD-10-CM | POA: Diagnosis not present

## 2023-08-05 DIAGNOSIS — E1122 Type 2 diabetes mellitus with diabetic chronic kidney disease: Secondary | ICD-10-CM | POA: Diagnosis not present

## 2023-08-05 DIAGNOSIS — E785 Hyperlipidemia, unspecified: Secondary | ICD-10-CM | POA: Diagnosis not present

## 2023-08-05 DIAGNOSIS — K219 Gastro-esophageal reflux disease without esophagitis: Secondary | ICD-10-CM | POA: Diagnosis not present

## 2023-08-05 DIAGNOSIS — N189 Chronic kidney disease, unspecified: Secondary | ICD-10-CM | POA: Diagnosis not present

## 2023-08-05 DIAGNOSIS — N3946 Mixed incontinence: Secondary | ICD-10-CM | POA: Diagnosis not present

## 2023-08-05 DIAGNOSIS — M1990 Unspecified osteoarthritis, unspecified site: Secondary | ICD-10-CM | POA: Diagnosis not present

## 2023-08-05 DIAGNOSIS — Z8249 Family history of ischemic heart disease and other diseases of the circulatory system: Secondary | ICD-10-CM | POA: Diagnosis not present

## 2023-08-05 DIAGNOSIS — Z809 Family history of malignant neoplasm, unspecified: Secondary | ICD-10-CM | POA: Diagnosis not present

## 2023-08-09 DIAGNOSIS — J189 Pneumonia, unspecified organism: Secondary | ICD-10-CM | POA: Diagnosis not present

## 2023-08-17 DIAGNOSIS — L821 Other seborrheic keratosis: Secondary | ICD-10-CM | POA: Diagnosis not present

## 2023-08-17 DIAGNOSIS — L570 Actinic keratosis: Secondary | ICD-10-CM | POA: Diagnosis not present

## 2023-08-17 DIAGNOSIS — Z85828 Personal history of other malignant neoplasm of skin: Secondary | ICD-10-CM | POA: Diagnosis not present

## 2023-08-17 DIAGNOSIS — D2272 Melanocytic nevi of left lower limb, including hip: Secondary | ICD-10-CM | POA: Diagnosis not present

## 2023-08-17 DIAGNOSIS — D692 Other nonthrombocytopenic purpura: Secondary | ICD-10-CM | POA: Diagnosis not present

## 2023-09-06 DIAGNOSIS — Z881 Allergy status to other antibiotic agents status: Secondary | ICD-10-CM | POA: Diagnosis not present

## 2023-09-06 DIAGNOSIS — K858 Other acute pancreatitis without necrosis or infection: Secondary | ICD-10-CM | POA: Diagnosis not present

## 2023-09-06 DIAGNOSIS — Z90710 Acquired absence of both cervix and uterus: Secondary | ICD-10-CM | POA: Diagnosis not present

## 2023-09-06 DIAGNOSIS — Z7951 Long term (current) use of inhaled steroids: Secondary | ICD-10-CM | POA: Diagnosis not present

## 2023-09-06 DIAGNOSIS — K8500 Idiopathic acute pancreatitis without necrosis or infection: Secondary | ICD-10-CM | POA: Diagnosis not present

## 2023-09-06 DIAGNOSIS — Z7984 Long term (current) use of oral hypoglycemic drugs: Secondary | ICD-10-CM | POA: Diagnosis not present

## 2023-09-06 DIAGNOSIS — K8020 Calculus of gallbladder without cholecystitis without obstruction: Secondary | ICD-10-CM | POA: Diagnosis not present

## 2023-09-06 DIAGNOSIS — Z8719 Personal history of other diseases of the digestive system: Secondary | ICD-10-CM | POA: Diagnosis not present

## 2023-09-06 DIAGNOSIS — I10 Essential (primary) hypertension: Secondary | ICD-10-CM | POA: Diagnosis not present

## 2023-09-06 DIAGNOSIS — E119 Type 2 diabetes mellitus without complications: Secondary | ICD-10-CM | POA: Diagnosis not present

## 2023-09-06 DIAGNOSIS — R1010 Upper abdominal pain, unspecified: Secondary | ICD-10-CM | POA: Diagnosis not present

## 2023-09-06 DIAGNOSIS — K8689 Other specified diseases of pancreas: Secondary | ICD-10-CM | POA: Diagnosis not present

## 2023-09-06 DIAGNOSIS — I868 Varicose veins of other specified sites: Secondary | ICD-10-CM | POA: Diagnosis not present

## 2023-09-06 DIAGNOSIS — R112 Nausea with vomiting, unspecified: Secondary | ICD-10-CM | POA: Diagnosis not present

## 2023-09-06 DIAGNOSIS — Z888 Allergy status to other drugs, medicaments and biological substances status: Secondary | ICD-10-CM | POA: Diagnosis not present

## 2023-09-06 DIAGNOSIS — Z79899 Other long term (current) drug therapy: Secondary | ICD-10-CM | POA: Diagnosis not present

## 2023-09-06 DIAGNOSIS — R079 Chest pain, unspecified: Secondary | ICD-10-CM | POA: Diagnosis not present

## 2023-09-06 DIAGNOSIS — Z91041 Radiographic dye allergy status: Secondary | ICD-10-CM | POA: Diagnosis not present

## 2023-09-06 DIAGNOSIS — Z96653 Presence of artificial knee joint, bilateral: Secondary | ICD-10-CM | POA: Diagnosis not present

## 2023-09-06 DIAGNOSIS — Z801 Family history of malignant neoplasm of trachea, bronchus and lung: Secondary | ICD-10-CM | POA: Diagnosis not present

## 2023-09-06 DIAGNOSIS — Z66 Do not resuscitate: Secondary | ICD-10-CM | POA: Diagnosis not present

## 2023-09-06 DIAGNOSIS — Z833 Family history of diabetes mellitus: Secondary | ICD-10-CM | POA: Diagnosis not present

## 2023-09-06 DIAGNOSIS — Z803 Family history of malignant neoplasm of breast: Secondary | ICD-10-CM | POA: Diagnosis not present

## 2023-09-06 DIAGNOSIS — R109 Unspecified abdominal pain: Secondary | ICD-10-CM | POA: Diagnosis not present

## 2023-09-06 DIAGNOSIS — K859 Acute pancreatitis without necrosis or infection, unspecified: Secondary | ICD-10-CM | POA: Diagnosis not present

## 2023-09-06 DIAGNOSIS — R0789 Other chest pain: Secondary | ICD-10-CM | POA: Diagnosis not present

## 2023-09-09 DIAGNOSIS — J189 Pneumonia, unspecified organism: Secondary | ICD-10-CM | POA: Diagnosis not present

## 2023-09-18 DIAGNOSIS — R600 Localized edema: Secondary | ICD-10-CM | POA: Diagnosis not present

## 2023-09-18 DIAGNOSIS — L0390 Cellulitis, unspecified: Secondary | ICD-10-CM | POA: Diagnosis not present

## 2023-09-18 DIAGNOSIS — K7469 Other cirrhosis of liver: Secondary | ICD-10-CM | POA: Diagnosis not present

## 2023-09-18 DIAGNOSIS — E1165 Type 2 diabetes mellitus with hyperglycemia: Secondary | ICD-10-CM | POA: Diagnosis not present

## 2023-09-18 DIAGNOSIS — K858 Other acute pancreatitis without necrosis or infection: Secondary | ICD-10-CM | POA: Diagnosis not present

## 2023-09-25 DIAGNOSIS — Z1330 Encounter for screening examination for mental health and behavioral disorders, unspecified: Secondary | ICD-10-CM | POA: Diagnosis not present

## 2023-09-25 DIAGNOSIS — I10 Essential (primary) hypertension: Secondary | ICD-10-CM | POA: Diagnosis not present

## 2023-09-25 DIAGNOSIS — K853 Drug induced acute pancreatitis without necrosis or infection: Secondary | ICD-10-CM | POA: Diagnosis not present

## 2023-10-09 DIAGNOSIS — J189 Pneumonia, unspecified organism: Secondary | ICD-10-CM | POA: Diagnosis not present

## 2023-10-16 DIAGNOSIS — R748 Abnormal levels of other serum enzymes: Secondary | ICD-10-CM | POA: Diagnosis not present

## 2023-11-09 DIAGNOSIS — J189 Pneumonia, unspecified organism: Secondary | ICD-10-CM | POA: Diagnosis not present

## 2023-11-22 ENCOUNTER — Telehealth: Payer: Self-pay | Admitting: Gastroenterology

## 2023-11-22 NOTE — Telephone Encounter (Signed)
Inbound call from patient stating she wanted to make an appointment with Dr. Doy Hutching due to Dr. Russella Dar retiring. Patient was scheduled for 1/20 at 8:30. Patient states normally before she comes to her appointment, Dr. Russella Dar has her do a liver MRI and labs done. Patient is wanting to do the same before her appointment with Dr. Doy Hutching. Please advise.

## 2023-11-22 NOTE — Telephone Encounter (Signed)
The pt aware to keep appt and Dr Doy Hutching will order the needed testing at that time. The pt has been advised of the information and verbalized understanding.

## 2023-12-09 DIAGNOSIS — J189 Pneumonia, unspecified organism: Secondary | ICD-10-CM | POA: Diagnosis not present

## 2024-01-08 DIAGNOSIS — E1165 Type 2 diabetes mellitus with hyperglycemia: Secondary | ICD-10-CM | POA: Diagnosis not present

## 2024-01-08 DIAGNOSIS — E782 Mixed hyperlipidemia: Secondary | ICD-10-CM | POA: Diagnosis not present

## 2024-01-08 DIAGNOSIS — R748 Abnormal levels of other serum enzymes: Secondary | ICD-10-CM | POA: Diagnosis not present

## 2024-01-09 DIAGNOSIS — J189 Pneumonia, unspecified organism: Secondary | ICD-10-CM | POA: Diagnosis not present

## 2024-01-14 NOTE — Progress Notes (Unsigned)
[facility] Return Visit   Referring Provider Stacey Duff, PA-C [name] [place],  [place] [address]  Primary Care Provider Stacey Duff, PA-C  Patient Profile: Stacey Cooper is a 89 y.o. female with a past medical history of HTN, HLD, T2DM, subclinical hypothyroidism, peripheral neuropathy who returns to the [facility] for follow-up of the problem(s) noted below.  Problem List: Compensated cirrhosis secondary to [facility] Pancreatic cystic lesions -favored side branch IPMNs Pancreas divisum on MR imaging 2024 GERD Cholelithiasis, asymptomatic Status post umbilical hernia repair   History of Present Illness   Stacey Cooper was last seen in the GI office on 09/17/2023 by Dr. Russella Cooper   Current GI Meds  None  Interval History  Stacey Cooper returns to the office today reporting that she has overall been clinically stable since her last visit in summer 2024.  Denies jaundice, scleral icterus abdominal distention No hematemesis, melena, hematochezia or easy bruising Notes that she has chronic 1+ bilateral lower quadrant edema -takes Lasix for this States that she does not take Lasix on days she is going out of the house for concern of excess urination but takes it most days Endorses fatigue but no mental status changes to suggest hepatic encephalopathy  States that she had a fall last week in her home when she twisted her right leg and hip.  Notes that she has bilateral knee replacements.  She has lingering pain that is making it difficult to walk and will follow-up with her PCP.  Abdominal ultrasound last summer identified a pancreatic cystic lesion MRI with contrast 07/2023 showed several (> 5) cystic pancreatic lesions with the largest measuring 3.8 x 2.9 x 3.9 cm in the pancreatic neck with morphologic features suggestive of sidebranch IPMN's  No family history of liver disease or cirrhosis No family history of pancreatic cancer or  colorectal cancer  Last colonoscopy: 04/2015 -2 sessile polyps in the ascending colon, 2 angiodysplastic lesions in the transverse colon and ascending colon, external hemorrhoids Last endoscopy: 08/2018 - Normal - no varices  Last Abd CT/CTE/MRE: MRI with contrast 07/2023 showed several (> 5) cystic pancreatic lesions with the largest measuring 3.8 x 2.9 x 3.9 cm in the pancreatic neck with morphologic features suggestive of sidebranch IPMN's; pancreas divisum  GI Review of Symptoms Significant for None. Otherwise negative.  General Review of Systems  Review of systems is significant for the pertinent positives and negatives as listed per the HPI.  Full ROS is otherwise negative.  Past Medical History   Past Medical History:  Diagnosis Date   Actinic keratoses 07/18/2011   Alopecia 01/24/2011   Qualifier: Diagnosis of  By: Stacey Greenspan MD, Stacey     Anterior dislocation of right shoulder    Arthralgia 05/03/2015   Back pain 12/08/2011   Basal cell carcinoma of skin of lip 01/24/2011   Blood transfusion without reported diagnosis    Cataract    bilataeral cateracts removed   CHOLELITHIASIS, HX OF 02/07/2011   Chronic kidney disease    Diabetes mellitus type 2 in obese 01/24/2011   Qualifier: Diagnosis of  By: Stacey Greenspan MD, Stacey Cooper  Annual eye exam with [facility] on 06/14/13    Essential hypertension, benign 01/24/2011   Fatty liver    GERD 01/24/2011   Grief reaction 01/20/2014   HAIR LOSS 01/24/2011   Hearing loss of both ears 06/28/2013   Hepatic cirrhosis (HCC) 04/27/2015   Hyperlipidemia, mixed 04/24/2012   Insomnia 07/24/2012   Joint pain  MEASLES, HX OF 01/24/2011   Morbid obesity (HCC) 01/24/2011   NONSPEC ELEVATION OF LEVELS OF TRANSAMINASE/LDH 02/07/2011   Peripheral neuropathy 04/20/2011   Right hip pain 05/03/2015   SCC (squamous cell carcinoma), face 07/18/2011   SQUAMOUS CELL CARCINOMA OTHER SPEC SITES SKIN 01/24/2011   Swelling    Tubular adenoma of colon 04/2015   Umbilical hernia  08/23/2016   URINARY INCONTINENCE 02/07/2011   UTI'S, HX OF 01/24/2011   Vulvar atrophy 03/17/2017     Past Surgical History   Past Surgical History:  Procedure Laterality Date   ABDOMINAL HYSTERECTOMY     partial, both ovaries left in place   [facility] removed below lower lip on left  26 yrs ago   cataract extraction, b/l     COLONOSCOPY     Left knee arthroscopy and cleaning prior to TKR     SHOULDER CLOSED REDUCTION Right 07/14/2018   Procedure: CLOSED REDUCTION SHOULDER, RIGHT;  Surgeon: Tarry Kos, MD;  Location: [facility] OR;  Service: Orthopedics;  Laterality: Right;   skin bx, right anterior CW , squamous cancer, had to redo to margins  12/2010   total knee replacement, b/l       Allergies and Medications   Allergies  Allergen Reactions   Contrast Media [Iodinated Contrast Media] Shortness Of Breath   Doxycycline     Severe oral ulcers.   Lovastatin     myalgias    Current Meds  Medication Sig   Blood Glucose Monitoring Suppl (PRODIGY AUTOCODE BLOOD GLUCOSE) w/Device KIT 1 Device by Does not apply route 3 (three) times daily between meals as needed.   fluorouracil (EFUDEX) 5 % cream Apply topically daily. Use for two weeks   furosemide (LASIX) 20 MG tablet Take 0.5 tablets (10 mg total) by mouth daily as needed.   glimepiride (AMARYL) 2 MG tablet Take 1 tablet (2 mg total) by mouth daily before breakfast.   Lancets MISC Check blood sugars qam and once daily prn   lisinopril (ZESTRIL) 5 MG tablet Take 1 tablet (5 mg total) by mouth daily.   ONETOUCH ULTRA test strip USE TO TEST BLOOD SUGAR TWICE A DAY AS DIRECTED   rosuvastatin (CRESTOR) 5 MG tablet Take 1 tablet (5 mg total) by mouth every other day.     Family History   Family History  Problem Relation Age of Onset   Arthritis Mother        RA   Myelodysplastic syndrome Mother    Heart disease Mother    Cancer Father        skin   Stroke Father    Colon polyps Father        61 in larage intes removed carcinoid tumor  confined in the tumor   Stroke Maternal Grandmother    Kidney failure Maternal Grandfather    Esophageal cancer Neg Hx    Rectal cancer Neg Hx    Stomach cancer Neg Hx     Social History   Ealy is widowed -her husband passed away of colon cancer She has 1 son who has multiple sclerosis Currently lives alone and states she is managing adequately Helps take care of her great niece who is an Chief Executive [employer] school 3 times a week Never smoker No alcohol  Johniece reports that she has never smoked. She has never used smokeless tobacco. She reports that she does not drink alcohol and does not use drugs.  Vital Signs and Physical Examination   Vitals:   01/15/24 1610  BP: 124/70  Pulse: 85  Height: 5\' 4"  (1.626 m)  Weight: 213 lb (96.6 kg)  BMI (Calculated): 36.54    General: Elderly, overweight, no acute distress Head: Normocephalic and atraumatic Eyes: Sclerae anicteric Mouth: No deformities or lesions noted Lungs: Clear throughout to auscultation Heart: Regular rate and rhythm; No murmurs, rubs or bruits Abdomen: Soft, non tender and non distended. No masses; body habitus limits ability to assess hepatosplenomegaly normal Bowel sounds Rectal: Musculoskeletal: Symmetrical with no gross deformities  Pulses:  Normal pulses noted Extremities: 1+ edema bilaterally or deformities noted, ambulates with a cane Neurological: Alert oriented x 4, grossly nonfocal Psychological:  Alert and cooperative. Normal mood and affect   Review of Data  The following data was reviewed at the time of this encounter:  Laboratory Studies      Latest Ref Rng & Units 07/17/2023    8:53 AM 01/11/2023    9:40 AM 11/25/2021   10:23 AM  CBC  WBC 4.0 - 10.5 K/uL 7.9  8.0  6.8   Hemoglobin 12.0 - 15.0 g/dL 19.1  47.8  29.5   Hematocrit 36.0 - 46.0 % 41.5  37.4  38.8   Platelets 150.0 - 400.0 K/uL 193.0  186.0  181.0     Lab Results  Component Value Date   LIPASE 27 01/12/2021      Latest Ref Rng &  Units 07/17/2023    8:53 AM 01/11/2023    9:40 AM 11/25/2021   10:23 AM  CMP  Glucose 70 - 99 mg/dL 621  308  657   BUN 6 - 23 mg/dL 10  12  19    Creatinine 0.40 - 1.20 mg/dL 8.46  9.62  9.52   Sodium 135 - 145 mEq/L 142  143  143   Potassium 3.5 - 5.1 mEq/L 3.9  4.2  4.2   Chloride 96 - 112 mEq/L 106  107  106   CO2 19 - 32 mEq/L 24  26  27    Calcium 8.4 - 10.5 mg/dL 9.8  9.8  84.1   Total Protein 6.0 - 8.3 g/dL 7.2  7.2  7.4   Total Bilirubin 0.2 - 1.2 mg/dL 1.3  0.8  1.0   Alkaline Phos 39 - 117 U/L 118  122  97   AST 0 - 37 U/L 31  29  24    ALT 0 - 35 U/L 13  13  13     06/2023 PT 12.4 INR 1.2 AFP 4.5  Imaging Studies  MRI abdomen 07/31/2023 1. Severely motion degraded scan, significantly limiting assessment. 2. Cirrhosis. No appreciable liver masses. 3. Several (greater than 5) cystic pancreatic lesions scattered throughout the pancreas, largest 3.8 x 2.9 x 3.9 cm in the pancreatic neck with lobulated outer contour and thin internal septations, with no solid enhancement or other high risk MRI features. Side branch IPMNs favored. Follow-up MRI abdomen without and with IV contrast is recommended in 2 years. This recommendation follows ACR consensus guidelines: Management of Incidental Pancreatic Cysts: A White Paper of the ACR Incidental Findings Committee. J Am Coll Radiol 2017;14:911-923. 4. Apparent pancreas divisum. 5. Cholelithiasis. No biliary ductal dilatation. 6. Moderate splenorenal shunt. No ascites. 7. Small bilateral pleural effusions.  RUQ [name] 07/17/2023 1. Cirrhotic morphology of the liver. No focal hepatic lesion. 2. Cholelithiasis without secondary signs to suggest acute cholecystitis. 3. Indeterminate cystic lesion in the region of the pancreas measuring 1.9 cm. Recommend further evaluation with pre and post contrast-enhanced abdominal MRI  RUQ [name]  12/28/2022 1. Cirrhotic morphology in the liver.  No mass identified. 2. The common bile duct is borderline  in caliber. Recommend correlation with LFTs. 3. Cholelithiasis and mild gallbladder wall thickening. No Murphy's sign or pericholecystic fluid. Recommend clinical correlation.  RUQ [name] 06/17/2022 1. Cholelithiasis in an otherwise normal appearing gallbladder. 2. Cirrhotic morphology to the liver with a coarsened echotexture and a nodular contour. No liver mass identified.  GI Procedures and Studies  EGD 08/2018 08/2018 - Normal - no varices or PHG  EGD/Colonoscopy 04/2015 EGD - Normal - no varices of PHG Colonoscopy - 04/2015 -2 sessile polyps in the ascending colon, 2 angiodysplastic lesions in the transverse colon and ascending colon, external hemorrhoids Path: TA and submucosal lipoma   Clinical Impression  It is my clinical impression that Stacey Cooper is a 89 y.o. female with;  Compensated cirrhosis secondary to [facility] Pancreatic cystic lesions -favored side branch IPMNs Pancreas divisum GERD Cholelithiasis, asymptomatic Status post umbilical hernia repair  Stacey Cooper has a longstanding history of cirrhosis attributed to metabolic associated dysfunction and steatohepatitis (MASH).  Her cirrhosis has been compensated in the past without overt evidence of progressing portal hypertension.  Reviewed that she is due for laboratory studies today and surveillance ultrasound.  With regard to her history of pancreatic cystic lesions identified in summer 2024, radiographically these are favored to be sidebranch IPMN's which statistically have a low risk of malignant transformation.  Given the size of the lesions noted I have recommended a follow-up in 1 year from prior.  Plan  Labs today: CBC, CMP, PT/INR, AFP Schedule right upper quadrant ultrasound for cirrhosis surveillance within the next month Schedule abdominal MRI with contrast to follow-up suspected IPMN's summer 2025  Planned Follow Up  1 year  The patient or caregiver verbalized understanding of the material covered, with no  barriers to understanding. All questions were answered. Patient or caregiver is agreeable with the plan outlined above.    It was a pleasure to see Stacey Cooper.  If you have any questions or concerns regarding this evaluation, do not hesitate to contact me.  Maren Beach, MD [facility]   I spent total of 30 minutes in both face-to-face and non-face-to-face activities, excluding procedures performed, for the visit on the date of this encounter.

## 2024-01-15 ENCOUNTER — Encounter: Payer: Self-pay | Admitting: Pediatrics

## 2024-01-15 ENCOUNTER — Ambulatory Visit (INDEPENDENT_AMBULATORY_CARE_PROVIDER_SITE_OTHER): Payer: Medicare HMO | Admitting: Pediatrics

## 2024-01-15 ENCOUNTER — Other Ambulatory Visit (INDEPENDENT_AMBULATORY_CARE_PROVIDER_SITE_OTHER): Payer: Medicare HMO

## 2024-01-15 DIAGNOSIS — K862 Cyst of pancreas: Secondary | ICD-10-CM

## 2024-01-15 DIAGNOSIS — K7460 Unspecified cirrhosis of liver: Secondary | ICD-10-CM | POA: Diagnosis not present

## 2024-01-15 LAB — CBC WITH DIFFERENTIAL/PLATELET
Basophils Absolute: 0 10*3/uL (ref 0.0–0.1)
Basophils Relative: 0.6 % (ref 0.0–3.0)
Eosinophils Absolute: 0.1 10*3/uL (ref 0.0–0.7)
Eosinophils Relative: 0.7 % (ref 0.0–5.0)
HCT: 39.9 % (ref 36.0–46.0)
Hemoglobin: 13.4 g/dL (ref 12.0–15.0)
Lymphocytes Relative: 12 % (ref 12.0–46.0)
Lymphs Abs: 0.9 10*3/uL (ref 0.7–4.0)
MCHC: 33.5 g/dL (ref 30.0–36.0)
MCV: 95.4 fL (ref 78.0–100.0)
Monocytes Absolute: 0.8 10*3/uL (ref 0.1–1.0)
Monocytes Relative: 10.8 % (ref 3.0–12.0)
Neutro Abs: 5.4 10*3/uL (ref 1.4–7.7)
Neutrophils Relative %: 75.9 % (ref 43.0–77.0)
Platelets: 205 10*3/uL (ref 150.0–400.0)
RBC: 4.18 Mil/uL (ref 3.87–5.11)
RDW: 14.2 % (ref 11.5–15.5)
WBC: 7.2 10*3/uL (ref 4.0–10.5)

## 2024-01-15 LAB — COMPREHENSIVE METABOLIC PANEL
ALT: 13 U/L (ref 0–35)
AST: 31 U/L (ref 0–37)
Albumin: 3.5 g/dL (ref 3.5–5.2)
Alkaline Phosphatase: 127 U/L — ABNORMAL HIGH (ref 39–117)
BUN: 12 mg/dL (ref 6–23)
CO2: 26 meq/L (ref 19–32)
Calcium: 9.6 mg/dL (ref 8.4–10.5)
Chloride: 105 meq/L (ref 96–112)
Creatinine, Ser: 1.03 mg/dL (ref 0.40–1.20)
GFR: 49.29 mL/min — ABNORMAL LOW (ref >60.00)
Glucose, Bld: 143 mg/dL — ABNORMAL HIGH (ref 70–99)
Potassium: 3.6 meq/L (ref 3.5–5.1)
Sodium: 141 meq/L (ref 135–145)
Total Bilirubin: 1.2 mg/dL (ref 0.2–1.2)
Total Protein: 7.1 g/dL (ref 6.0–8.3)

## 2024-01-15 LAB — PROTIME-INR
INR: 1.3 {ratio} — ABNORMAL HIGH (ref 0.8–1.0)
Prothrombin Time: 13.3 s — ABNORMAL HIGH (ref 9.6–13.1)

## 2024-01-15 NOTE — Patient Instructions (Addendum)
_______________________________________________________  If your blood pressure at your visit was 140/90 or greater, please contact your primary care physician to follow up on this. _______________________________________________________  If you are age 87 or older, your body mass index should be between 23-30. Your Body mass index is 36.56 kg/m. If this is out of the aforementioned range listed, please consider follow up with your Primary Care Provider. ________________________________________________________  The [facility] providers would like to encourage you to use [facility] to communicate with providers for non-urgent requests or questions.  Due to long hold times on the telephone, sending your provider a message by [facility] may be a faster and more efficient way to get a response.  Please allow 48 business hours for a response.  Please remember that this is for non-urgent requests.  _______________________________________________________  Your provider has requested that you go to the basement level for lab work before leaving today. Press "B" on the elevator. The lab is located at the first door on the left as you exit the elevator.  You have been scheduled for an abdominal ultrasound at [facility] Radiology (1st floor of hospital) on 01-23-24 at 8:30am. Please arrive 30 minutes prior to your appointment for registration. Make certain not to have anything to eat or drink 6 hours prior to your appointment. Should you need to reschedule your appointment, please contact radiology at [phone]. This test typically takes about 30 minutes to perform.  Your provider has requested that you go to the basement level for lab work in 6 months (July 2025). Press "B" on the elevator. The lab is located at the first door on the left as you exit the elevator.  You will need an MRI abdomen with contrast to follow up pancreatic cyst. We will contact you to schedule this appointment.  You will follow up in  our office in 1 year. We will contact you to schedule this appointment.  Thank you for entrusting me with your care and choosing [facility].  Due to recent changes in healthcare laws, you may see the results of your imaging and laboratory studies on MyChart before your provider has had a chance to review them.  We understand that in some cases there may be results that are confusing or concerning to you. Not all laboratory results come back in the same time frame and the provider may be waiting for multiple results in order to interpret others.  Please give [name] 48 hours in order for your provider to thoroughly review all the results before contacting the office for clarification of your results.   Dr Doy Hutching

## 2024-01-17 DIAGNOSIS — M25561 Pain in right knee: Secondary | ICD-10-CM | POA: Diagnosis not present

## 2024-01-17 DIAGNOSIS — M25551 Pain in right hip: Secondary | ICD-10-CM | POA: Diagnosis not present

## 2024-01-18 LAB — AFP TUMOR MARKER: AFP-Tumor Marker: 4.8 ng/mL

## 2024-01-19 DIAGNOSIS — I10 Essential (primary) hypertension: Secondary | ICD-10-CM | POA: Diagnosis not present

## 2024-01-19 DIAGNOSIS — R296 Repeated falls: Secondary | ICD-10-CM | POA: Diagnosis not present

## 2024-01-19 DIAGNOSIS — Z743 Need for continuous supervision: Secondary | ICD-10-CM | POA: Diagnosis not present

## 2024-01-19 DIAGNOSIS — I480 Paroxysmal atrial fibrillation: Secondary | ICD-10-CM | POA: Diagnosis not present

## 2024-01-19 DIAGNOSIS — Z043 Encounter for examination and observation following other accident: Secondary | ICD-10-CM | POA: Diagnosis not present

## 2024-01-19 DIAGNOSIS — W19XXXA Unspecified fall, initial encounter: Secondary | ICD-10-CM | POA: Diagnosis not present

## 2024-01-19 DIAGNOSIS — I4892 Unspecified atrial flutter: Secondary | ICD-10-CM | POA: Diagnosis not present

## 2024-01-19 DIAGNOSIS — J189 Pneumonia, unspecified organism: Secondary | ICD-10-CM | POA: Diagnosis not present

## 2024-01-19 DIAGNOSIS — R0603 Acute respiratory distress: Secondary | ICD-10-CM | POA: Diagnosis not present

## 2024-01-19 DIAGNOSIS — R509 Fever, unspecified: Secondary | ICD-10-CM | POA: Diagnosis not present

## 2024-01-19 DIAGNOSIS — J1569 Pneumonia due to other gram-negative bacteria: Secondary | ICD-10-CM | POA: Diagnosis not present

## 2024-01-19 DIAGNOSIS — E119 Type 2 diabetes mellitus without complications: Secondary | ICD-10-CM | POA: Diagnosis not present

## 2024-01-19 DIAGNOSIS — J101 Influenza due to other identified influenza virus with other respiratory manifestations: Secondary | ICD-10-CM | POA: Diagnosis not present

## 2024-01-19 DIAGNOSIS — W1830XA Fall on same level, unspecified, initial encounter: Secondary | ICD-10-CM | POA: Diagnosis not present

## 2024-01-20 DIAGNOSIS — J101 Influenza due to other identified influenza virus with other respiratory manifestations: Secondary | ICD-10-CM | POA: Diagnosis not present

## 2024-01-21 DIAGNOSIS — J101 Influenza due to other identified influenza virus with other respiratory manifestations: Secondary | ICD-10-CM | POA: Diagnosis not present

## 2024-01-22 DIAGNOSIS — J101 Influenza due to other identified influenza virus with other respiratory manifestations: Secondary | ICD-10-CM | POA: Diagnosis not present

## 2024-01-23 ENCOUNTER — Ambulatory Visit (HOSPITAL_COMMUNITY)
Admission: RE | Admit: 2024-01-23 | Discharge: 2024-01-23 | Disposition: A | Discharge status: Home / Self Care | Payer: Medicare HMO | Source: Ambulatory Visit | Attending: Pediatrics | Admitting: Pediatrics

## 2024-01-23 ENCOUNTER — Telehealth: Payer: Self-pay

## 2024-01-23 ENCOUNTER — Ambulatory Visit (HOSPITAL_COMMUNITY): Payer: Medicare HMO

## 2024-01-23 DIAGNOSIS — K7460 Unspecified cirrhosis of liver: Secondary | ICD-10-CM | POA: Diagnosis not present

## 2024-01-23 DIAGNOSIS — K8020 Calculus of gallbladder without cholecystitis without obstruction: Secondary | ICD-10-CM | POA: Diagnosis not present

## 2024-01-23 DIAGNOSIS — K862 Cyst of pancreas: Secondary | ICD-10-CM | POA: Insufficient documentation

## 2024-01-23 DIAGNOSIS — K7689 Other specified diseases of liver: Secondary | ICD-10-CM | POA: Diagnosis not present

## 2024-01-23 DIAGNOSIS — R932 Abnormal findings on diagnostic imaging of liver and biliary tract: Secondary | ICD-10-CM | POA: Diagnosis not present

## 2024-01-23 NOTE — Transitions of Care (Post Inpatient/ED Visit) (Signed)
   01/23/2024  Name: Stacey Cooper MRN: [id number] DOB: [birth year]  Today's TOC FU Call Status: Today's TOC FU Call Status:: Unsuccessful Call (2nd Attempt) Unsuccessful Call (2nd Attempt) Date: 01/23/24  Attempted to reach the patient regarding the most recent Inpatient/ED visit.  Follow Up Plan: Additional outreach attempts will be made to reach the patient to complete the Transitions of Care (Post Inpatient/ED visit) call.   Lonia Chimera, RN, BSN, CEN [employer]- Transition of Care Team.  [facility] [phone]

## 2024-01-23 NOTE — Transitions of Care (Post Inpatient/ED Visit) (Signed)
   01/23/2024  Name: Stacey Cooper MRN: [id number] DOB: 05/18/1935  Today's TOC FU Call Status: Today's TOC FU Call Status:: Unsuccessful Call (1st Attempt) Unsuccessful Call (1st Attempt) Date: 01/23/24 (patient in the bed. Family requested a call back later today)  Attempted to reach the patient regarding the most recent Inpatient/ED visit.  Follow Up Plan: Additional outreach attempts will be made to reach the patient to complete the Transitions of Care (Post Inpatient/ED visit) call.    Lonia Chimera, RN, BSN, CEN [employer]- Transition of Care Team.  [facility] [phone]

## 2024-01-23 NOTE — Transitions of Care (Post Inpatient/ED Visit) (Signed)
   01/23/2024  Name: Stacey Cooper MRN: [id number] DOB: 1935-07-27  Today's TOC FU Call Status: Today's TOC FU Call Status:: Successful TOC FU Call Completed TOC FU Call Complete Date: 01/23/24  Attempted to reach the patient regarding the most recent Inpatient/ED visit. Patient PCP at [facility] Follow Up Plan: No further outreach attempts will be made at this time. We have been unable to contact the patient.  Signature Karena Addison, LPN [facility] Nurse Health Advisor Direct Dial [phone]

## 2024-01-24 ENCOUNTER — Telehealth: Payer: Self-pay

## 2024-01-24 NOTE — Transitions of Care (Post Inpatient/ED Visit) (Signed)
   01/24/2024  Name: Stacey Cooper MRN: [id number] DOB: 1935/06/11  Today's TOC FU Call Status: Today's TOC FU Call Status:: Successful TOC FU Call Completed TOC FU Call Complete Date: 01/24/24 Patient's Name and Date of Birth confirmed.  Transition Care Management Follow-up Telephone Call Date of Discharge: 01/22/24 Discharge Facility: Other (Non-[facility] Facility) Type of Discharge: Inpatient Admission  Items Reviewed:  Placed call to patient and explained purpose. Patient reports that she [facility] not go to [facility].  Reports that she goes to [facility] . PCP in EPIC correct.   Notified leadership  Kendra Humble  Lonia Chimera, RN, BSN, CEN [employer]- Transition of Care Team.  [facility] [phone]

## 2024-01-25 DIAGNOSIS — E1165 Type 2 diabetes mellitus with hyperglycemia: Secondary | ICD-10-CM | POA: Diagnosis not present

## 2024-01-25 DIAGNOSIS — J1569 Pneumonia due to other gram-negative bacteria: Secondary | ICD-10-CM | POA: Diagnosis not present

## 2024-01-26 DIAGNOSIS — I480 Paroxysmal atrial fibrillation: Secondary | ICD-10-CM | POA: Diagnosis not present

## 2024-01-26 DIAGNOSIS — E1165 Type 2 diabetes mellitus with hyperglycemia: Secondary | ICD-10-CM | POA: Diagnosis not present

## 2024-01-26 DIAGNOSIS — Z7985 Long-term (current) use of injectable non-insulin antidiabetic drugs: Secondary | ICD-10-CM | POA: Diagnosis not present

## 2024-01-26 DIAGNOSIS — I10 Essential (primary) hypertension: Secondary | ICD-10-CM | POA: Diagnosis not present

## 2024-01-26 DIAGNOSIS — Z9181 History of falling: Secondary | ICD-10-CM | POA: Diagnosis not present

## 2024-01-26 DIAGNOSIS — Z7901 Long term (current) use of anticoagulants: Secondary | ICD-10-CM | POA: Diagnosis not present

## 2024-01-26 DIAGNOSIS — J09X2 Influenza due to identified novel influenza A virus with other respiratory manifestations: Secondary | ICD-10-CM | POA: Diagnosis not present

## 2024-01-26 DIAGNOSIS — M6281 Muscle weakness (generalized): Secondary | ICD-10-CM | POA: Diagnosis not present

## 2024-01-31 DIAGNOSIS — Z7901 Long term (current) use of anticoagulants: Secondary | ICD-10-CM | POA: Diagnosis not present

## 2024-01-31 DIAGNOSIS — I480 Paroxysmal atrial fibrillation: Secondary | ICD-10-CM | POA: Diagnosis not present

## 2024-01-31 DIAGNOSIS — I10 Essential (primary) hypertension: Secondary | ICD-10-CM | POA: Diagnosis not present

## 2024-01-31 DIAGNOSIS — J09X2 Influenza due to identified novel influenza A virus with other respiratory manifestations: Secondary | ICD-10-CM | POA: Diagnosis not present

## 2024-01-31 DIAGNOSIS — Z9181 History of falling: Secondary | ICD-10-CM | POA: Diagnosis not present

## 2024-01-31 DIAGNOSIS — M6281 Muscle weakness (generalized): Secondary | ICD-10-CM | POA: Diagnosis not present

## 2024-01-31 DIAGNOSIS — Z7985 Long-term (current) use of injectable non-insulin antidiabetic drugs: Secondary | ICD-10-CM | POA: Diagnosis not present

## 2024-01-31 DIAGNOSIS — E1165 Type 2 diabetes mellitus with hyperglycemia: Secondary | ICD-10-CM | POA: Diagnosis not present

## 2024-02-01 DIAGNOSIS — I480 Paroxysmal atrial fibrillation: Secondary | ICD-10-CM | POA: Diagnosis not present

## 2024-02-01 DIAGNOSIS — I10 Essential (primary) hypertension: Secondary | ICD-10-CM | POA: Diagnosis not present

## 2024-02-02 DIAGNOSIS — I10 Essential (primary) hypertension: Secondary | ICD-10-CM | POA: Diagnosis not present

## 2024-02-07 DIAGNOSIS — J40 Bronchitis, not specified as acute or chronic: Secondary | ICD-10-CM | POA: Diagnosis not present

## 2024-02-07 DIAGNOSIS — R600 Localized edema: Secondary | ICD-10-CM | POA: Diagnosis not present

## 2024-02-07 DIAGNOSIS — I10 Essential (primary) hypertension: Secondary | ICD-10-CM | POA: Diagnosis not present

## 2024-02-07 DIAGNOSIS — E1165 Type 2 diabetes mellitus with hyperglycemia: Secondary | ICD-10-CM | POA: Diagnosis not present

## 2024-02-08 DIAGNOSIS — K7460 Unspecified cirrhosis of liver: Secondary | ICD-10-CM | POA: Diagnosis not present

## 2024-02-08 DIAGNOSIS — K8020 Calculus of gallbladder without cholecystitis without obstruction: Secondary | ICD-10-CM | POA: Diagnosis not present

## 2024-02-08 DIAGNOSIS — K859 Acute pancreatitis without necrosis or infection, unspecified: Secondary | ICD-10-CM | POA: Diagnosis not present

## 2024-02-08 DIAGNOSIS — K766 Portal hypertension: Secondary | ICD-10-CM | POA: Diagnosis not present

## 2024-02-08 DIAGNOSIS — K862 Cyst of pancreas: Secondary | ICD-10-CM | POA: Diagnosis not present

## 2024-02-08 DIAGNOSIS — J9811 Atelectasis: Secondary | ICD-10-CM | POA: Diagnosis not present

## 2024-02-08 DIAGNOSIS — I2510 Atherosclerotic heart disease of native coronary artery without angina pectoris: Secondary | ICD-10-CM | POA: Diagnosis not present

## 2024-02-09 DIAGNOSIS — K859 Acute pancreatitis without necrosis or infection, unspecified: Secondary | ICD-10-CM | POA: Diagnosis not present

## 2024-02-09 DIAGNOSIS — J189 Pneumonia, unspecified organism: Secondary | ICD-10-CM | POA: Diagnosis not present

## 2024-02-10 DIAGNOSIS — K859 Acute pancreatitis without necrosis or infection, unspecified: Secondary | ICD-10-CM | POA: Diagnosis not present

## 2024-02-11 DIAGNOSIS — K859 Acute pancreatitis without necrosis or infection, unspecified: Secondary | ICD-10-CM | POA: Diagnosis not present

## 2024-02-14 DIAGNOSIS — K858 Other acute pancreatitis without necrosis or infection: Secondary | ICD-10-CM | POA: Diagnosis not present

## 2024-02-14 DIAGNOSIS — I10 Essential (primary) hypertension: Secondary | ICD-10-CM | POA: Diagnosis not present

## 2024-02-14 DIAGNOSIS — E1165 Type 2 diabetes mellitus with hyperglycemia: Secondary | ICD-10-CM | POA: Diagnosis not present

## 2024-02-22 DIAGNOSIS — I4819 Other persistent atrial fibrillation: Secondary | ICD-10-CM | POA: Diagnosis not present

## 2024-02-22 DIAGNOSIS — R053 Chronic cough: Secondary | ICD-10-CM | POA: Diagnosis not present

## 2024-02-22 DIAGNOSIS — K858 Other acute pancreatitis without necrosis or infection: Secondary | ICD-10-CM | POA: Diagnosis not present

## 2024-02-22 DIAGNOSIS — E1165 Type 2 diabetes mellitus with hyperglycemia: Secondary | ICD-10-CM | POA: Diagnosis not present

## 2024-02-22 DIAGNOSIS — I10 Essential (primary) hypertension: Secondary | ICD-10-CM | POA: Diagnosis not present

## 2024-03-04 DIAGNOSIS — I5030 Unspecified diastolic (congestive) heart failure: Secondary | ICD-10-CM | POA: Diagnosis not present

## 2024-03-04 DIAGNOSIS — Z1330 Encounter for screening examination for mental health and behavioral disorders, unspecified: Secondary | ICD-10-CM | POA: Diagnosis not present

## 2024-03-04 DIAGNOSIS — I4819 Other persistent atrial fibrillation: Secondary | ICD-10-CM | POA: Diagnosis not present

## 2024-03-08 DIAGNOSIS — J189 Pneumonia, unspecified organism: Secondary | ICD-10-CM | POA: Diagnosis not present

## 2024-03-15 DIAGNOSIS — R600 Localized edema: Secondary | ICD-10-CM | POA: Diagnosis not present

## 2024-03-15 DIAGNOSIS — R531 Weakness: Secondary | ICD-10-CM | POA: Diagnosis not present

## 2024-03-15 DIAGNOSIS — I10 Essential (primary) hypertension: Secondary | ICD-10-CM | POA: Diagnosis not present

## 2024-03-18 DIAGNOSIS — E1140 Type 2 diabetes mellitus with diabetic neuropathy, unspecified: Secondary | ICD-10-CM | POA: Diagnosis not present

## 2024-03-18 DIAGNOSIS — R7989 Other specified abnormal findings of blood chemistry: Secondary | ICD-10-CM | POA: Diagnosis not present

## 2024-03-18 DIAGNOSIS — I5033 Acute on chronic diastolic (congestive) heart failure: Secondary | ICD-10-CM | POA: Diagnosis not present

## 2024-03-18 DIAGNOSIS — I5030 Unspecified diastolic (congestive) heart failure: Secondary | ICD-10-CM | POA: Diagnosis not present

## 2024-03-18 DIAGNOSIS — I4891 Unspecified atrial fibrillation: Secondary | ICD-10-CM | POA: Diagnosis not present

## 2024-03-18 DIAGNOSIS — R0602 Shortness of breath: Secondary | ICD-10-CM | POA: Diagnosis not present

## 2024-03-18 DIAGNOSIS — N179 Acute kidney failure, unspecified: Secondary | ICD-10-CM | POA: Diagnosis not present

## 2024-03-18 DIAGNOSIS — E1142 Type 2 diabetes mellitus with diabetic polyneuropathy: Secondary | ICD-10-CM | POA: Diagnosis not present

## 2024-03-18 DIAGNOSIS — I4819 Other persistent atrial fibrillation: Secondary | ICD-10-CM | POA: Diagnosis not present

## 2024-03-18 DIAGNOSIS — M7989 Other specified soft tissue disorders: Secondary | ICD-10-CM | POA: Diagnosis not present

## 2024-03-18 DIAGNOSIS — I4892 Unspecified atrial flutter: Secondary | ICD-10-CM | POA: Diagnosis not present

## 2024-03-19 DIAGNOSIS — I5033 Acute on chronic diastolic (congestive) heart failure: Secondary | ICD-10-CM | POA: Diagnosis not present

## 2024-03-20 DIAGNOSIS — E876 Hypokalemia: Secondary | ICD-10-CM | POA: Diagnosis not present

## 2024-03-20 DIAGNOSIS — E1165 Type 2 diabetes mellitus with hyperglycemia: Secondary | ICD-10-CM | POA: Diagnosis not present

## 2024-03-20 DIAGNOSIS — I5033 Acute on chronic diastolic (congestive) heart failure: Secondary | ICD-10-CM | POA: Diagnosis not present

## 2024-03-20 DIAGNOSIS — I10 Essential (primary) hypertension: Secondary | ICD-10-CM | POA: Diagnosis not present

## 2024-03-20 DIAGNOSIS — I480 Paroxysmal atrial fibrillation: Secondary | ICD-10-CM | POA: Diagnosis not present

## 2024-03-21 DIAGNOSIS — I10 Essential (primary) hypertension: Secondary | ICD-10-CM | POA: Diagnosis not present

## 2024-03-21 DIAGNOSIS — E876 Hypokalemia: Secondary | ICD-10-CM | POA: Diagnosis not present

## 2024-03-21 DIAGNOSIS — I5033 Acute on chronic diastolic (congestive) heart failure: Secondary | ICD-10-CM | POA: Diagnosis not present

## 2024-03-21 DIAGNOSIS — I480 Paroxysmal atrial fibrillation: Secondary | ICD-10-CM | POA: Diagnosis not present

## 2024-03-21 DIAGNOSIS — E1165 Type 2 diabetes mellitus with hyperglycemia: Secondary | ICD-10-CM | POA: Diagnosis not present

## 2024-03-22 ENCOUNTER — Telehealth: Payer: Self-pay

## 2024-03-22 NOTE — Transitions of Care (Post Inpatient/ED Visit) (Signed)
   03/22/2024  Name: MERCER STALLWORTH MRN: [id number] DOB: 05/30/1935  Today's TOC FU Call Status: Today's TOC FU Call Status:: Successful TOC FU Call Completed TOC FU Call Complete Date: 03/22/24 Patient's Name and Date of Birth confirmed.  Transition Care Management Follow-up Telephone Call Date of Discharge: 03/21/24 Discharge Facility: Other (Non-[facility] Facility) Name of Other ([facility]) [facility]charge Facility: [facility] Type of Discharge: Inpatient Admission Primary Inpatient Discharge Diagnosis:: Heart failure How have you been since you were released from the hospital?: Better  Patient reports to me that her MD is no longer [facility].  Reports she is seeing MD at [facility].   Encouraged patient to call her MD if she feels worse.   Lonia Chimera, RN, BSN, CEN [employer]- Transition of Care Team.  [facility] [phone]

## 2024-03-25 DIAGNOSIS — Z6838 Body mass index (BMI) 38.0-38.9, adult: Secondary | ICD-10-CM | POA: Diagnosis not present

## 2024-03-25 DIAGNOSIS — R55 Syncope and collapse: Secondary | ICD-10-CM | POA: Diagnosis not present

## 2024-03-25 DIAGNOSIS — I130 Hypertensive heart and chronic kidney disease with heart failure and stage 1 through stage 4 chronic kidney disease, or unspecified chronic kidney disease: Secondary | ICD-10-CM | POA: Diagnosis not present

## 2024-03-25 DIAGNOSIS — E1165 Type 2 diabetes mellitus with hyperglycemia: Secondary | ICD-10-CM | POA: Diagnosis not present

## 2024-03-25 DIAGNOSIS — R2681 Unsteadiness on feet: Secondary | ICD-10-CM | POA: Diagnosis not present

## 2024-03-25 DIAGNOSIS — I5030 Unspecified diastolic (congestive) heart failure: Secondary | ICD-10-CM | POA: Diagnosis not present

## 2024-03-25 DIAGNOSIS — I110 Hypertensive heart disease with heart failure: Secondary | ICD-10-CM | POA: Diagnosis not present

## 2024-03-25 DIAGNOSIS — Z79899 Other long term (current) drug therapy: Secondary | ICD-10-CM | POA: Diagnosis not present

## 2024-03-25 DIAGNOSIS — Z743 Need for continuous supervision: Secondary | ICD-10-CM | POA: Diagnosis not present

## 2024-03-25 DIAGNOSIS — I959 Hypotension, unspecified: Secondary | ICD-10-CM | POA: Diagnosis not present

## 2024-03-25 DIAGNOSIS — K7460 Unspecified cirrhosis of liver: Secondary | ICD-10-CM | POA: Diagnosis not present

## 2024-03-25 DIAGNOSIS — N179 Acute kidney failure, unspecified: Secondary | ICD-10-CM | POA: Diagnosis not present

## 2024-03-25 DIAGNOSIS — R600 Localized edema: Secondary | ICD-10-CM | POA: Diagnosis not present

## 2024-03-25 DIAGNOSIS — Z794 Long term (current) use of insulin: Secondary | ICD-10-CM | POA: Diagnosis not present

## 2024-03-25 DIAGNOSIS — I5033 Acute on chronic diastolic (congestive) heart failure: Secondary | ICD-10-CM | POA: Diagnosis not present

## 2024-03-25 DIAGNOSIS — Z7901 Long term (current) use of anticoagulants: Secondary | ICD-10-CM | POA: Diagnosis not present

## 2024-03-25 DIAGNOSIS — I10 Essential (primary) hypertension: Secondary | ICD-10-CM | POA: Diagnosis not present

## 2024-03-25 DIAGNOSIS — I493 Ventricular premature depolarization: Secondary | ICD-10-CM | POA: Diagnosis not present

## 2024-03-25 DIAGNOSIS — I440 Atrioventricular block, first degree: Secondary | ICD-10-CM | POA: Diagnosis not present

## 2024-03-25 DIAGNOSIS — E785 Hyperlipidemia, unspecified: Secondary | ICD-10-CM | POA: Diagnosis not present

## 2024-03-25 DIAGNOSIS — J0130 Acute sphenoidal sinusitis, unspecified: Secondary | ICD-10-CM | POA: Diagnosis not present

## 2024-03-25 DIAGNOSIS — R262 Difficulty in walking, not elsewhere classified: Secondary | ICD-10-CM | POA: Diagnosis not present

## 2024-03-25 DIAGNOSIS — R0609 Other forms of dyspnea: Secondary | ICD-10-CM | POA: Diagnosis not present

## 2024-03-25 DIAGNOSIS — N1832 Chronic kidney disease, stage 3b: Secondary | ICD-10-CM | POA: Diagnosis not present

## 2024-03-25 DIAGNOSIS — I5031 Acute diastolic (congestive) heart failure: Secondary | ICD-10-CM | POA: Diagnosis not present

## 2024-03-25 DIAGNOSIS — R0902 Hypoxemia: Secondary | ICD-10-CM | POA: Diagnosis not present

## 2024-03-25 DIAGNOSIS — R5383 Other fatigue: Secondary | ICD-10-CM | POA: Diagnosis not present

## 2024-03-25 DIAGNOSIS — K7581 Nonalcoholic steatohepatitis (NASH): Secondary | ICD-10-CM | POA: Diagnosis not present

## 2024-03-25 DIAGNOSIS — R000 Tachycardia, unspecified: Secondary | ICD-10-CM | POA: Diagnosis not present

## 2024-03-25 DIAGNOSIS — I4891 Unspecified atrial fibrillation: Secondary | ICD-10-CM | POA: Diagnosis not present

## 2024-03-25 DIAGNOSIS — K861 Other chronic pancreatitis: Secondary | ICD-10-CM | POA: Diagnosis not present

## 2024-03-25 DIAGNOSIS — I480 Paroxysmal atrial fibrillation: Secondary | ICD-10-CM | POA: Diagnosis not present

## 2024-03-25 DIAGNOSIS — M6281 Muscle weakness (generalized): Secondary | ICD-10-CM | POA: Diagnosis not present

## 2024-03-25 DIAGNOSIS — K8020 Calculus of gallbladder without cholecystitis without obstruction: Secondary | ICD-10-CM | POA: Diagnosis not present

## 2024-03-25 DIAGNOSIS — E1169 Type 2 diabetes mellitus with other specified complication: Secondary | ICD-10-CM | POA: Diagnosis not present

## 2024-03-25 DIAGNOSIS — R0602 Shortness of breath: Secondary | ICD-10-CM | POA: Diagnosis not present

## 2024-03-25 DIAGNOSIS — R059 Cough, unspecified: Secondary | ICD-10-CM | POA: Diagnosis not present

## 2024-03-25 DIAGNOSIS — E1122 Type 2 diabetes mellitus with diabetic chronic kidney disease: Secondary | ICD-10-CM | POA: Diagnosis not present

## 2024-03-25 DIAGNOSIS — R739 Hyperglycemia, unspecified: Secondary | ICD-10-CM | POA: Diagnosis not present

## 2024-03-25 DIAGNOSIS — I4819 Other persistent atrial fibrillation: Secondary | ICD-10-CM | POA: Diagnosis not present

## 2024-03-25 DIAGNOSIS — E119 Type 2 diabetes mellitus without complications: Secondary | ICD-10-CM | POA: Diagnosis not present

## 2024-03-25 DIAGNOSIS — R531 Weakness: Secondary | ICD-10-CM | POA: Diagnosis not present

## 2024-04-04 DIAGNOSIS — Z794 Long term (current) use of insulin: Secondary | ICD-10-CM | POA: Diagnosis not present

## 2024-04-04 DIAGNOSIS — I480 Paroxysmal atrial fibrillation: Secondary | ICD-10-CM | POA: Diagnosis not present

## 2024-04-04 DIAGNOSIS — E785 Hyperlipidemia, unspecified: Secondary | ICD-10-CM | POA: Diagnosis not present

## 2024-04-04 DIAGNOSIS — Z7901 Long term (current) use of anticoagulants: Secondary | ICD-10-CM | POA: Diagnosis not present

## 2024-04-04 DIAGNOSIS — I110 Hypertensive heart disease with heart failure: Secondary | ICD-10-CM | POA: Diagnosis not present

## 2024-04-04 DIAGNOSIS — R600 Localized edema: Secondary | ICD-10-CM | POA: Diagnosis not present

## 2024-04-04 DIAGNOSIS — N1832 Chronic kidney disease, stage 3b: Secondary | ICD-10-CM | POA: Diagnosis not present

## 2024-04-04 DIAGNOSIS — J189 Pneumonia, unspecified organism: Secondary | ICD-10-CM | POA: Diagnosis not present

## 2024-04-04 DIAGNOSIS — D72829 Elevated white blood cell count, unspecified: Secondary | ICD-10-CM | POA: Diagnosis not present

## 2024-04-04 DIAGNOSIS — K5901 Slow transit constipation: Secondary | ICD-10-CM | POA: Diagnosis not present

## 2024-04-04 DIAGNOSIS — M6281 Muscle weakness (generalized): Secondary | ICD-10-CM | POA: Diagnosis not present

## 2024-04-04 DIAGNOSIS — E1122 Type 2 diabetes mellitus with diabetic chronic kidney disease: Secondary | ICD-10-CM | POA: Diagnosis not present

## 2024-04-04 DIAGNOSIS — R052 Subacute cough: Secondary | ICD-10-CM | POA: Diagnosis not present

## 2024-04-04 DIAGNOSIS — E669 Obesity, unspecified: Secondary | ICD-10-CM | POA: Diagnosis not present

## 2024-04-04 DIAGNOSIS — R262 Difficulty in walking, not elsewhere classified: Secondary | ICD-10-CM | POA: Diagnosis not present

## 2024-04-04 DIAGNOSIS — R2681 Unsteadiness on feet: Secondary | ICD-10-CM | POA: Diagnosis not present

## 2024-04-04 DIAGNOSIS — K7460 Unspecified cirrhosis of liver: Secondary | ICD-10-CM | POA: Diagnosis not present

## 2024-04-04 DIAGNOSIS — I5033 Acute on chronic diastolic (congestive) heart failure: Secondary | ICD-10-CM | POA: Diagnosis not present

## 2024-04-04 DIAGNOSIS — I10 Essential (primary) hypertension: Secondary | ICD-10-CM | POA: Diagnosis not present

## 2024-04-04 DIAGNOSIS — E1169 Type 2 diabetes mellitus with other specified complication: Secondary | ICD-10-CM | POA: Diagnosis not present

## 2024-04-08 DIAGNOSIS — E669 Obesity, unspecified: Secondary | ICD-10-CM | POA: Diagnosis not present

## 2024-04-08 DIAGNOSIS — N1832 Chronic kidney disease, stage 3b: Secondary | ICD-10-CM | POA: Diagnosis not present

## 2024-04-08 DIAGNOSIS — K7460 Unspecified cirrhosis of liver: Secondary | ICD-10-CM | POA: Diagnosis not present

## 2024-04-08 DIAGNOSIS — J189 Pneumonia, unspecified organism: Secondary | ICD-10-CM | POA: Diagnosis not present

## 2024-04-08 DIAGNOSIS — Z7901 Long term (current) use of anticoagulants: Secondary | ICD-10-CM | POA: Diagnosis not present

## 2024-04-08 DIAGNOSIS — I5033 Acute on chronic diastolic (congestive) heart failure: Secondary | ICD-10-CM | POA: Diagnosis not present

## 2024-04-08 DIAGNOSIS — Z794 Long term (current) use of insulin: Secondary | ICD-10-CM | POA: Diagnosis not present

## 2024-04-08 DIAGNOSIS — E1122 Type 2 diabetes mellitus with diabetic chronic kidney disease: Secondary | ICD-10-CM | POA: Diagnosis not present

## 2024-04-08 DIAGNOSIS — I10 Essential (primary) hypertension: Secondary | ICD-10-CM | POA: Diagnosis not present

## 2024-04-08 DIAGNOSIS — R052 Subacute cough: Secondary | ICD-10-CM | POA: Diagnosis not present

## 2024-04-08 DIAGNOSIS — R600 Localized edema: Secondary | ICD-10-CM | POA: Diagnosis not present

## 2024-04-08 DIAGNOSIS — I480 Paroxysmal atrial fibrillation: Secondary | ICD-10-CM | POA: Diagnosis not present

## 2024-04-08 DIAGNOSIS — D72829 Elevated white blood cell count, unspecified: Secondary | ICD-10-CM | POA: Diagnosis not present

## 2024-04-10 DIAGNOSIS — K5901 Slow transit constipation: Secondary | ICD-10-CM | POA: Diagnosis not present

## 2024-04-10 DIAGNOSIS — I5033 Acute on chronic diastolic (congestive) heart failure: Secondary | ICD-10-CM | POA: Diagnosis not present

## 2024-04-10 DIAGNOSIS — Z794 Long term (current) use of insulin: Secondary | ICD-10-CM | POA: Diagnosis not present

## 2024-04-10 DIAGNOSIS — E1122 Type 2 diabetes mellitus with diabetic chronic kidney disease: Secondary | ICD-10-CM | POA: Diagnosis not present

## 2024-04-10 DIAGNOSIS — N1832 Chronic kidney disease, stage 3b: Secondary | ICD-10-CM | POA: Diagnosis not present

## 2024-04-10 DIAGNOSIS — R052 Subacute cough: Secondary | ICD-10-CM | POA: Diagnosis not present

## 2024-04-10 DIAGNOSIS — R600 Localized edema: Secondary | ICD-10-CM | POA: Diagnosis not present

## 2024-04-10 DIAGNOSIS — I480 Paroxysmal atrial fibrillation: Secondary | ICD-10-CM | POA: Diagnosis not present

## 2024-04-10 DIAGNOSIS — I10 Essential (primary) hypertension: Secondary | ICD-10-CM | POA: Diagnosis not present

## 2024-04-15 DIAGNOSIS — R600 Localized edema: Secondary | ICD-10-CM | POA: Diagnosis not present

## 2024-04-15 DIAGNOSIS — I10 Essential (primary) hypertension: Secondary | ICD-10-CM | POA: Diagnosis not present

## 2024-04-15 DIAGNOSIS — N1832 Chronic kidney disease, stage 3b: Secondary | ICD-10-CM | POA: Diagnosis not present

## 2024-04-15 DIAGNOSIS — K5901 Slow transit constipation: Secondary | ICD-10-CM | POA: Diagnosis not present

## 2024-04-15 DIAGNOSIS — I480 Paroxysmal atrial fibrillation: Secondary | ICD-10-CM | POA: Diagnosis not present

## 2024-04-15 DIAGNOSIS — I5033 Acute on chronic diastolic (congestive) heart failure: Secondary | ICD-10-CM | POA: Diagnosis not present

## 2024-04-15 DIAGNOSIS — Z794 Long term (current) use of insulin: Secondary | ICD-10-CM | POA: Diagnosis not present

## 2024-04-15 DIAGNOSIS — E1122 Type 2 diabetes mellitus with diabetic chronic kidney disease: Secondary | ICD-10-CM | POA: Diagnosis not present

## 2024-04-22 DIAGNOSIS — K861 Other chronic pancreatitis: Secondary | ICD-10-CM | POA: Diagnosis not present

## 2024-04-22 DIAGNOSIS — R600 Localized edema: Secondary | ICD-10-CM | POA: Diagnosis not present

## 2024-04-22 DIAGNOSIS — I509 Heart failure, unspecified: Secondary | ICD-10-CM | POA: Diagnosis not present

## 2024-04-29 DIAGNOSIS — I5030 Unspecified diastolic (congestive) heart failure: Secondary | ICD-10-CM | POA: Diagnosis not present

## 2024-04-29 DIAGNOSIS — I480 Paroxysmal atrial fibrillation: Secondary | ICD-10-CM | POA: Diagnosis not present

## 2024-04-29 DIAGNOSIS — I10 Essential (primary) hypertension: Secondary | ICD-10-CM | POA: Diagnosis not present

## 2024-05-08 DIAGNOSIS — J189 Pneumonia, unspecified organism: Secondary | ICD-10-CM | POA: Diagnosis not present

## 2024-05-24 DIAGNOSIS — I4819 Other persistent atrial fibrillation: Secondary | ICD-10-CM | POA: Diagnosis not present

## 2024-05-24 DIAGNOSIS — I5033 Acute on chronic diastolic (congestive) heart failure: Secondary | ICD-10-CM | POA: Diagnosis not present

## 2024-05-24 DIAGNOSIS — R600 Localized edema: Secondary | ICD-10-CM | POA: Diagnosis not present

## 2024-05-27 DIAGNOSIS — I10 Essential (primary) hypertension: Secondary | ICD-10-CM | POA: Diagnosis not present

## 2024-05-27 DIAGNOSIS — K861 Other chronic pancreatitis: Secondary | ICD-10-CM | POA: Diagnosis not present

## 2024-05-27 DIAGNOSIS — R600 Localized edema: Secondary | ICD-10-CM | POA: Diagnosis not present

## 2024-05-27 DIAGNOSIS — I509 Heart failure, unspecified: Secondary | ICD-10-CM | POA: Diagnosis not present

## 2024-05-27 DIAGNOSIS — E1165 Type 2 diabetes mellitus with hyperglycemia: Secondary | ICD-10-CM | POA: Diagnosis not present

## 2024-05-27 DIAGNOSIS — I4819 Other persistent atrial fibrillation: Secondary | ICD-10-CM | POA: Diagnosis not present

## 2024-05-29 ENCOUNTER — Telehealth: Payer: Self-pay

## 2024-05-29 DIAGNOSIS — K862 Cyst of pancreas: Secondary | ICD-10-CM

## 2024-05-29 DIAGNOSIS — K7460 Unspecified cirrhosis of liver: Secondary | ICD-10-CM

## 2024-05-29 NOTE — Telephone Encounter (Signed)
 Called and spoke with patient to remind her that she is due for MRCP and labs at this time. Patient has been advised that she can schedule MRCP between now and July at her convenience. Patient knows to expect a call from radiology scheduling to set this up. Patient can stop by the [facility] lab at her convenience for repeat AFP. Patient verbalized understanding and had no concerns at the end of the call.   Lab order in epic. MRCP order in epic. Secure staff message sent to radiology scheduling to contact patient to set up appt.

## 2024-05-29 NOTE — Telephone Encounter (Signed)
-----   Message from Truddie Furrow sent at 05/28/2024  9:20 PM EDT ----- Please order MRCP ----- Message ----- From: Marline Simons, RN Sent: 05/28/2024  10:48 AM EDT To: Truddie Furrow, MD  Dr. Yvone Herd, did you want MRI only or MRCP? Following up on cirrhosis and pancreatic lesions. Please advise, thanks. ----- Message ----- From: Glennette Lanius, RN Sent: 05/28/2024   9:27 AM EDT To: [facility] Pod B Triage   ----- Message ----- From: Glennette Lanius, RN Sent: 05/28/2024  12:00 AM EDT To: Glennette Lanius, RN  June or July 2025; MRI of the abdomen with contrast to follow-up pancreatic lesions and to reassess her liver (cirrhosis). Also needs AFP. [place] pt; [see 01/23/24 u/s result note for additional info]; place orders and contact pt with appt.   (Also may need to clarify if [place] wants MRI abdomen or MRCP)

## 2024-06-10 ENCOUNTER — Ambulatory Visit: Payer: Self-pay | Admitting: Pediatrics

## 2024-06-10 ENCOUNTER — Other Ambulatory Visit: Payer: Self-pay | Admitting: Pediatrics

## 2024-06-10 ENCOUNTER — Ambulatory Visit (HOSPITAL_COMMUNITY)
Admission: RE | Admit: 2024-06-10 | Discharge: 2024-06-10 | Disposition: A | Discharge status: Home / Self Care | Source: Ambulatory Visit | Attending: Pediatrics | Admitting: Pediatrics

## 2024-06-10 DIAGNOSIS — K862 Cyst of pancreas: Secondary | ICD-10-CM | POA: Diagnosis not present

## 2024-06-10 DIAGNOSIS — K7460 Unspecified cirrhosis of liver: Secondary | ICD-10-CM | POA: Diagnosis not present

## 2024-06-10 DIAGNOSIS — K766 Portal hypertension: Secondary | ICD-10-CM | POA: Diagnosis not present

## 2024-06-10 DIAGNOSIS — N281 Cyst of kidney, acquired: Secondary | ICD-10-CM | POA: Diagnosis not present

## 2024-06-10 MED ORDER — GADOBUTROL 1 MMOL/ML IV SOLN: 7.0000 mL | Freq: Once | INTRAVENOUS | Status: DC | PRN

## 2024-06-10 MED ORDER — GADOBUTROL 1 MMOL/ML IV SOLN
9.0000 mL | Freq: Once | INTRAVENOUS | Status: AC | PRN
Start: 2024-06-10 — End: 2024-06-10
  Administered 2024-06-10: 9 mL via INTRAVENOUS

## 2024-06-19 DIAGNOSIS — R600 Localized edema: Secondary | ICD-10-CM | POA: Diagnosis not present

## 2024-06-19 DIAGNOSIS — F411 Generalized anxiety disorder: Secondary | ICD-10-CM | POA: Diagnosis not present

## 2024-06-25 ENCOUNTER — Telehealth: Payer: Self-pay

## 2024-06-25 NOTE — Telephone Encounter (Signed)
Left message for patient to return call to further discuss.  ?

## 2024-06-25 NOTE — Telephone Encounter (Signed)
-----   Message from [facility] sent at 06/17/2024  1:18 PM EDT ----- Regarding: FW: [id number]  ----- Message ----- From: Benjamine Nat DEL, CMA Sent: 06/17/2024  12:00 AM EDT To: Nat DEL Benjamine, CMA Subject: [id number]                                       Pt needs CBC, CMP, PT/INR, AFP- follow up cirrhosis PT needs MRI abd with contrast-f/u pancreatic cyst

## 2024-06-27 NOTE — Telephone Encounter (Signed)
Left message for patient to return call to further discuss.  ?

## 2024-07-05 DIAGNOSIS — M5442 Lumbago with sciatica, left side: Secondary | ICD-10-CM | POA: Diagnosis not present

## 2024-07-05 DIAGNOSIS — G8929 Other chronic pain: Secondary | ICD-10-CM | POA: Diagnosis not present

## 2024-07-05 DIAGNOSIS — K861 Other chronic pancreatitis: Secondary | ICD-10-CM | POA: Diagnosis not present

## 2024-07-05 DIAGNOSIS — W19XXXA Unspecified fall, initial encounter: Secondary | ICD-10-CM | POA: Diagnosis not present

## 2024-07-22 ENCOUNTER — Telehealth: Payer: Self-pay

## 2024-07-22 DIAGNOSIS — I10 Essential (primary) hypertension: Secondary | ICD-10-CM

## 2024-08-01 ENCOUNTER — Telehealth: Payer: Self-pay | Admitting: *Deleted

## 2024-08-01 NOTE — Progress Notes (Signed)
 Complex Care Management Note Care Guide Note  08/01/2024 Name: Stacey Cooper MRN: [id number] DOB: Jun 22, 1935   Complex Care Management Outreach Attempts: An unsuccessful telephone outreach was attempted today to offer the patient information about available complex care management services.  Follow Up Plan:  Additional outreach attempts will be made to offer the patient complex care management information and services.   Encounter Outcome:  No Answer  Thedford Franks, CMA [place]  [facility], [facility] Guide Direct Dial: [phone]  Fax: [phone] Website: [email or web address]

## 2024-08-07 NOTE — Progress Notes (Signed)
 Complex Care Management Note Care Guide Note  08/07/2024 Name: Stacey Cooper MRN: [id number] DOB: 03/03/1935   Complex Care Management Outreach Attempts: A second unsuccessful outreach was attempted today to offer the patient with information about available complex care management services.  Follow Up Plan:  Additional outreach attempts will be made to offer the patient complex care management information and services.   Encounter Outcome:  No Answer  Thedford Franks, CMA [place]  [facility], [facility] Guide Direct Dial: [phone]  Fax: [phone] Website: [email or web address]

## 2024-08-08 NOTE — Progress Notes (Signed)
 Complex Care Management Note Care Guide Note  08/08/2024 Name: Stacey Cooper MRN: [id number] DOB: Dec 15, 1935   Complex Care Management Outreach Attempts: A third unsuccessful outreach was attempted today to offer the patient with information about available complex care management services.  Follow Up Plan:  No further outreach attempts will be made at this time. We have been unable to contact the patient to offer or enroll patient in complex care management services.  Encounter Outcome:  No Answer  Thedford Franks, CMA [place]  [facility], [facility] Guide Direct Dial: [phone]  Fax: [phone] Website: [email or web address]

## 2024-08-29 DIAGNOSIS — D0439 Carcinoma in situ of skin of other parts of face: Secondary | ICD-10-CM | POA: Diagnosis not present

## 2024-08-29 DIAGNOSIS — D044 Carcinoma in situ of skin of scalp and neck: Secondary | ICD-10-CM | POA: Diagnosis not present

## 2024-08-29 DIAGNOSIS — C44729 Squamous cell carcinoma of skin of left lower limb, including hip: Secondary | ICD-10-CM | POA: Diagnosis not present

## 2024-08-29 DIAGNOSIS — L570 Actinic keratosis: Secondary | ICD-10-CM | POA: Diagnosis not present

## 2024-08-29 DIAGNOSIS — D485 Neoplasm of uncertain behavior of skin: Secondary | ICD-10-CM | POA: Diagnosis not present

## 2024-09-11 DIAGNOSIS — E119 Type 2 diabetes mellitus without complications: Secondary | ICD-10-CM | POA: Diagnosis not present

## 2024-09-11 DIAGNOSIS — I5033 Acute on chronic diastolic (congestive) heart failure: Secondary | ICD-10-CM | POA: Diagnosis not present

## 2024-09-11 DIAGNOSIS — I509 Heart failure, unspecified: Secondary | ICD-10-CM | POA: Diagnosis not present

## 2024-09-11 DIAGNOSIS — K7460 Unspecified cirrhosis of liver: Secondary | ICD-10-CM | POA: Diagnosis not present

## 2024-09-11 DIAGNOSIS — I480 Paroxysmal atrial fibrillation: Secondary | ICD-10-CM | POA: Diagnosis not present

## 2024-09-11 DIAGNOSIS — I10 Essential (primary) hypertension: Secondary | ICD-10-CM | POA: Diagnosis not present

## 2024-09-12 DIAGNOSIS — N1832 Chronic kidney disease, stage 3b: Secondary | ICD-10-CM | POA: Diagnosis not present

## 2024-09-12 DIAGNOSIS — E785 Hyperlipidemia, unspecified: Secondary | ICD-10-CM | POA: Diagnosis not present

## 2024-09-12 DIAGNOSIS — Z888 Allergy status to other drugs, medicaments and biological substances status: Secondary | ICD-10-CM | POA: Diagnosis not present

## 2024-09-12 DIAGNOSIS — Z96653 Presence of artificial knee joint, bilateral: Secondary | ICD-10-CM | POA: Diagnosis not present

## 2024-09-12 DIAGNOSIS — E1122 Type 2 diabetes mellitus with diabetic chronic kidney disease: Secondary | ICD-10-CM | POA: Diagnosis not present

## 2024-09-12 DIAGNOSIS — Z794 Long term (current) use of insulin: Secondary | ICD-10-CM | POA: Diagnosis not present

## 2024-09-12 DIAGNOSIS — I130 Hypertensive heart and chronic kidney disease with heart failure and stage 1 through stage 4 chronic kidney disease, or unspecified chronic kidney disease: Secondary | ICD-10-CM | POA: Diagnosis not present

## 2024-09-12 DIAGNOSIS — Z6836 Body mass index (BMI) 36.0-36.9, adult: Secondary | ICD-10-CM | POA: Diagnosis not present

## 2024-09-12 DIAGNOSIS — Z91041 Radiographic dye allergy status: Secondary | ICD-10-CM | POA: Diagnosis not present

## 2024-09-12 DIAGNOSIS — Z90710 Acquired absence of both cervix and uterus: Secondary | ICD-10-CM | POA: Diagnosis not present

## 2024-09-12 DIAGNOSIS — K7460 Unspecified cirrhosis of liver: Secondary | ICD-10-CM | POA: Diagnosis not present

## 2024-09-12 DIAGNOSIS — I480 Paroxysmal atrial fibrillation: Secondary | ICD-10-CM | POA: Diagnosis not present

## 2024-09-12 DIAGNOSIS — Z7901 Long term (current) use of anticoagulants: Secondary | ICD-10-CM | POA: Diagnosis not present

## 2024-09-12 DIAGNOSIS — L03116 Cellulitis of left lower limb: Secondary | ICD-10-CM | POA: Diagnosis not present

## 2024-09-12 DIAGNOSIS — I509 Heart failure, unspecified: Secondary | ICD-10-CM | POA: Diagnosis not present

## 2024-09-12 DIAGNOSIS — I5033 Acute on chronic diastolic (congestive) heart failure: Secondary | ICD-10-CM | POA: Diagnosis not present

## 2024-09-12 DIAGNOSIS — E669 Obesity, unspecified: Secondary | ICD-10-CM | POA: Diagnosis not present

## 2024-09-12 DIAGNOSIS — Z7984 Long term (current) use of oral hypoglycemic drugs: Secondary | ICD-10-CM | POA: Diagnosis not present

## 2024-09-12 DIAGNOSIS — E876 Hypokalemia: Secondary | ICD-10-CM | POA: Diagnosis not present

## 2024-09-12 DIAGNOSIS — R0602 Shortness of breath: Secondary | ICD-10-CM | POA: Diagnosis not present

## 2024-09-12 DIAGNOSIS — Z881 Allergy status to other antibiotic agents status: Secondary | ICD-10-CM | POA: Diagnosis not present

## 2024-09-12 DIAGNOSIS — Z79899 Other long term (current) drug therapy: Secondary | ICD-10-CM | POA: Diagnosis not present

## 2024-09-12 DIAGNOSIS — M7989 Other specified soft tissue disorders: Secondary | ICD-10-CM | POA: Diagnosis not present

## 2024-09-13 DIAGNOSIS — I5033 Acute on chronic diastolic (congestive) heart failure: Secondary | ICD-10-CM | POA: Diagnosis not present

## 2024-09-14 DIAGNOSIS — I5033 Acute on chronic diastolic (congestive) heart failure: Secondary | ICD-10-CM | POA: Diagnosis not present

## 2024-09-15 DIAGNOSIS — I5033 Acute on chronic diastolic (congestive) heart failure: Secondary | ICD-10-CM | POA: Diagnosis not present

## 2024-09-16 NOTE — Care Plan (Signed)
  Problem: Discharge Planning Goal: Knowledge of medical problems (What is my main problem?) Outcome: Progressing Goal: Knowledge of self care (What do I need to do when I go home?) Outcome: Progressing Goal: Knowledge of treatment plan (Why is it important for me to do this?) Outcome: Progressing Goal: Knowledge of medication management Outcome: Progressing Goal: Identify mutually agreed upon short term goals of care: (edit field to enter goals) Description: REMINDER(s): May include: ambulate to bathroom without shortness of breath, ambulate in halls without SOB, complete ADLs without SOB, decrease fluid intake, increase fluid output, understand importance of daily weights when returning home. Outcome: Progressing Goal: Identify mutually agreed upon long term goals of care: (edit field to enter goals) Description: REMINDER(s): May include:  reduce sodium intake, daily weights, reading/understanding nutrition labels, attending scheduled MD appts, participate in heart failure clinic, decrease in/1 year absence of  hospitalizations, maintain/lose weight, quit smoking. Outcome: Progressing   Problem: Injury Risk, Abnormal Glucose Level Goal: Glucose level within specified parameters Outcome: Progressing   Problem: Sensory Perception - Impaired Goal: Absence of physical injury Outcome: Progressing   Problem: Deep Venous Thrombosis, Risk of Goal: Absence of deep venous thrombosis Outcome: Progressing   Problem: Bleeding, Risk of Goal: Absence of active bleeding Outcome: Progressing Goal: Absence of impaired coagulation signs and symptoms Outcome: Progressing   Problem: Fall Prevention Goal: Absence of falls Outcome: Progressing   Problem: Pressure Injury Goal: Pressure injury healing Outcome: Progressing Goal: Absence of new pressure injury Outcome: Progressing   Problem: Activity Intolerance Goal: Improved Activity Tolerance Outcome: Progressing   Problem: Cardiac Output  -  Decreased Goal: Cardiac output within specified parameters Outcome: Progressing   Problem: Fluid Volume - Excess Goal: Absence of fluid overload signs and symptoms Outcome: Progressing   Problem: Gas Exchange - Impaired Goal: Absence of cyanosis signs and symptoms Outcome: Progressing Goal: Use of effective breathing techniques Outcome: Progressing   Problem: Tissue Perfusion, Cardiopulmonary - Altered Goal: Blood pressure within specified parameters Outcome: Progressing

## 2024-09-16 NOTE — Progress Notes (Signed)
 Heart Failure Navigator Note   DATE OF ADMISSION:09/11/2024  ADMITTING DIAGNOSIS:Paroxysmal atrial fibrillation (*) [I48.0] Essential hypertension [I10] Non-alcoholic cirrhosis (*) [K74.60] Acute on chronic diastolic CHF (congestive heart failure) (*) [I50.33] Type 2 diabetes mellitus without complication, without long-term current use of insulin  (*) [E11.9] Acute on chronic diastolic CHF (congestive heart failure), NYHA class 1 (*) [I50.33]   CARDIOLOGIST: Redell Montenegro, MD ERE:Jdyozb LITTIE Bullock, PA-C   GET WITH THE GUIDELINES 2025:    LVEF 55-60% on 09/11/2024  ACE/ARB/ARNI(LVEF < 40%):  N/A - Diastolic Heart Failure  B-Blocker (LVEF < 40% with Carvedilol, Toprol XL or Bisoprolol): N/A - Diastolic Heart Failure.  Aldosterone Antagonist (LVEF < = 40% or NYHA Class III):  Yes  Hydralazine/Nitrate (African-American Patients Only): N/A - Diastolic Heart Failure and Patient is not African American  SGLT2i (All CHF Patients HFrEF and HFpEF, e.g. Jardiance, Farxiga, Invokana, Invokamet or Invokamet XR):  Jardiance  *Medications are per AVS - PO torsemide *IP Cardiology was consulted  Code Status:  Full Code 6 Month Mortality Risk: 40%  Appropriate for Palliative Care Consult: .N/A   Social History/Barriers of Care: advanced age and multiple comorbidities  Additional Notes:  Consult received and chart reviewed. I am familiar with Ms. Heimsoth from previous consult. She resides with niece, [name], who functions as caregiver. H&P notes about 30lb weight gain in the last 2-3 months with dry weight of 170-180lbs. Bed weight documented this morning is 178lbs.   Met with the patient at the bedside and explained role of HF nurse navigator as part of the interdisciplinary care team. Niece, [name] was present and recalled me from previous encounter. She shares that housing situation has been unstable for the last few months and they are currently residing in an [employer]. They are planned to move into  more permanent housing this week. Tanya recall previous HF education and states she referenced the HF action plan every day. Patient and niece do not recall previous instruction on fluid restriction. [name] describes daily fluid consumption likely in excess of 64oz. She also noted the patient likes to drink Dr. Nunzio. Tanya describes increase in BLE edema that is resolved when waking in the morning. Reviewed signs/symptoms of fluid excess, chronic vs acute changes, daily weights, dietary restrictions, and when to call her provider. [name] states patient was weighing daily, but their scale is currently boxed up. I noted patients weight this morning. Discussed follow up with cardiology. Patient and family verbalize understanding with questions answered.   Education Provided:  Caring for Your Heart with Heart Failure Booklet/Heart Failure Action Sheet Sodium intake monitoring and restriction--2000mg  or less sodium daily, read labels, avoid hidden salts found in canned, processed foods, and restaurant prepared meals Fluid intake monitoring and restriction--<64 fluid oz per day as directed by provider Medication compliance--continue medication as ordered or until provider says differently Signs and symptoms of fluid overload--swelling of feet, legs, abdomen and shortness of breath Daily weight monitoring--signs and symptoms to contact provider (Weight gain of 2 lbs in 1 day or 5 lbs in 1 week) When and how to contact outpatient provider--weight gain, shortness of breath, swelling, medication refills or side effects Coordination of care post discharge--attending all follow up appointments   Plan: Patients niece prefers follow up appt sometime mid-late next week to allow time for moving. Follow up with cardiology scheduled for 10/1 @ 10:40. Patient discharging home today with [facility] RN/PT.   Time spent in Chart review/Heart failure education/Care coordination:1 hour  [name] Bookman, RN BSN  Heart Failure  Nurse Navigator

## 2024-09-16 NOTE — Progress Notes (Signed)
/  [facility] Case Management Discharge Note   Patient:   Stacey Cooper MR Number:  [id number] Patient Date of Birth: 1935/03/21 Age/Sex:  89 y.o./female   Discharge Assessment   Facility/Agency Type: Home care  Home Health Services/Durable Medical Equipment at Discharge   Home Health/Other Discharge Services: PT, RN PT Services: Arranged RN Services: Arranged  Discharge Assistance     Discharge Transportation: Private vehicle   Pt to d/c home today. HH has been arranged with [facility]. SW sent a message to [facility] informing that pt will d/c today and requested SOC date. [facility] provided [facility] 9/23-24, SW has relayed the [facility] to all parties. Family will transport at d/c.   The level of care algorithm has been used for appropriate d/c planning.   Case Management has assessed this patient/family or caregiver's readiness, willingness and ability to provide or support self-management activities when needed after discharge from the acute care setting.  Electronically signed: Asencion Essex 09/16/2024 10:31 AM

## 2024-09-16 NOTE — Discharge Summary (Signed)
 [facility]  [facility] Inpatient Discharge Summary  PCP: Rosina LITTIE Bullock, PA-C Discharge Details   Admit date:         09/11/2024 Discharge date:        09/16/2024  Hospital Days:    5 days  Code Status:   Full Code Advanced Directives on file: Healthcare Power of Attorney        Discharge Diagnoses:  Principal Problem:   Acute on chronic diastolic CHF (congestive heart failure) (*) Active Problems:   Essential hypertension   Type 2 diabetes mellitus without complication, without long-term current use of insulin  (*)   Paroxysmal atrial fibrillation (*)   Non-alcoholic cirrhosis (*)   Acute on chronic diastolic CHF (congestive heart failure), NYHA class 1 (*)     Follow-Up Appointments Suggested: [facility] [address] [place]  [address]    Follow-Up Appointments Already Scheduled: Future Appointments  Date Time Provider Department Center  09/20/2024 10:40 AM Redell DELENA Montenegro, MD [facility] None    Discharge Medications:   Current Discharge Medication List     START taking these medications      Details  cefdinir  300 mg capsule Commonly known as: OMNICEF   Take one capsule (300 mg dose) by mouth daily for 5 days. Quantity: 5 capsule       CONTINUE these medications which have CHANGED      Details  spironolactone 50 mg tablet Commonly known as: ALDACTONE What changed:  medication strength how much to take  Take one tablet (50 mg dose) by mouth daily for 30 days. Indication: Diastolic Heart Failure Quantity: 30 tablet   Torsemide 40 MG Tabs What changed: medication strength  Take 40 mg by mouth daily for 30 days. Quantity: 30 tablet       CONTINUE these medications which have NOT CHANGED      Details  albuterol  sulfate HFA 108 (90 Base) MCG/ACT inhaler Commonly known as: PROVENTIL ,VENTOLIN ,PROAIR   Inhale one puff into the lungs every 4 (four) hours as needed.    apixaban 5 mg tablet Commonly known as: ELIQUIS  Take one tablet (5 mg dose) by mouth 2 (two) times daily. Indication: Atrial Fibrillation Quantity: 60 tablet   clotrimazole -betamethasone  cream Commonly known as: LOTRISONE   Apply topically as needed.   diltiazem HCl 60 mg 12 hr capsule Commonly known as: CARDIZEM ER  Take one capsule (60 mg dose) by mouth 2 (two) times daily for 30 days. Quantity: 60 capsule   empagliflozin 10 mg Tabs tablet Commonly known as: JARDIANCE  Take one tablet (10 mg dose) by mouth daily for 30 days. Indication: Cardiac Failure Quantity: 30 tablet   fluorouracil 5 % cream Commonly known as: EFUDEX  Apply topically 2 (two) times daily. :1 Application Topical Twice Daily for skin cancers removed.   glimepiride  2 mg tablet Commonly known as: AMARYL   Take one tablet (2 mg dose) by mouth 2 (two) times daily.   Insulin  Pen Needle 32G X 4 MM Misc Commonly known as: BD,SURE COMFORT,NOVOFINE  Use as directed Quantity: 100 each   metFORMIN  500 mg tablet Commonly known as: GLUCOPHAGE   Take one tablet (500 mg dose) by mouth 2 (two) times daily with meals. Quantity: 60 tablet   ONETOUCH VERIO REFLECT w/Device Kit  USE DAILY AS INSTRUCTED      * You might also be taking other medications not listed above. If you have questions about any of your other medications, talk to  the person who prescribed them or your Primary Care Provider.          STOP taking these medications    guaiFENesin 100 mg/5 mL Soln Commonly known as: SM TUSSIN MUCUS,ROBITUSSIN   LANTUS SOLOSTAR 100 UNIT/ML injection Generic drug: insulin  glargine   umeclidinium bromide 62.5 mcg/inh inhalation powder Commonly known as: INCRUSE ELLIPTA       ASK your doctor about these medications      Details  NOVOLOG FLEXPEN 100 UNIT/ML injection Generic drug: insulin  aspart  If Blood sugars 201-250   3 units Cash                          251-300    6 units [place]                           301-350    8 units Potter                          351- 400   10 units Frostproof       Greater than 400          12 units Lucas and call MD Quantity: 15 mL        Allergies: Allergies[1]  Consultations this Admission: CONSULT HEART FAILURE SUPPORT (NAVIGATOR / CHF CLINIC) IP CONSULT TO CARDIOLOGY IP CONSULT TO CASE MANAGEMENT, RN/SW IP CONSULT TO IV TEAM  Procedures/Imaging:     Echocardiogram Complete WO Enhancing Agent  Final Result  Left Ventricle: Systolic function is normal. EF: 55-60%. Quantitative   analysis of left ventricular Global Longitudinal Strain (GLS) imaging is   -14.300%, which is abnormal. (Strain imaging may not be accurate due to   sub-optimal endcardial /epicardial border deliniation) Wall motion is   normal.  .  Aortic Valve: The aortic valve is tricuspid. The leaflets exhibit   normal excursion. There is mild sclerosis. There is no regurgitation.  .  Mitral Valve: There is trace regurgitation.  .  Tricuspid Valve: There is trace regurgitation. The right ventricular   systolic pressure is normal (<36 mmHg).  .  Pulmonic Valve: Trace regurgitation.      XR Chest Ap Portable  Final Result  IMPRESSION:  1.  Negative acute.    Electronically Signed by: Elsie Dayhoff, MD on 09/11/2024 11:21 AM      Pertinent Labs:  Cardiac Labs: Recent Labs    Units 09/11/24 1036  BNP pg/mL 717   CBC: Recent Labs    Units 09/16/24 0417 09/15/24 0442 09/14/24 0311 09/13/24 0544 09/12/24 0429 09/11/24 1036  WBC thou/mcL 7.6 8.3 8.0 7.7 6.6 9.2  HGB gm/dL 88.6 87.3 88.0 88.1 88.2 13.4  PLT thou/mcL 188 203 189 172 178 157   BMP: Recent Labs    Units 09/16/24 0417 09/15/24 0442 09/14/24 0311 09/13/24 0544 09/12/24 0429 09/11/24 1036  NA mmol/L 135* 137 138 141 139 137  K mmol/L 3.7 3.9 3.4* 3.3* 3.3* 2.9*  CL mmol/L 100 99 100 101 98 98  CO2 mmol/L 25 28 27 30 26 26   BUN mg/dL 16 17 16 16 16 15   CREATININE mg/dL 8.76* 8.46* 8.76* 8.59* 1.40* 1.37*   MAGNESIUM mg/dL 2.1 2.1  --   --   --  1.7   Lipid Panel: No results for input(s): CHOL, TRIG, HDL, LDL in the last 168 hours. Liver Enzymes: Recent Labs  Units 09/16/24 0417 09/15/24 0442 09/13/24 0544 09/11/24 1036  INR  1.8 1.8  --  2.3  AST U/L 25 25 20 25   ALT U/L 9 9 8 11   ALKPHOS U/L 112 118 116 160  BILITOT mg/dL 0.7 0.8 0.8 1.3*   Endocrine Panels: Recent Labs    Units 09/16/24 0749 09/16/24 0417 09/15/24 2027 09/15/24 1644 09/15/24 1200 09/15/24 0810 09/15/24 0442 09/14/24 2031 09/14/24 1640 09/14/24 1209 09/14/24 0806 09/14/24 0311 09/11/24 1359 09/11/24 1036  HGBA1C %  --   --   --   --   --   --   --   --   --   --   --   --   --  8.0*  GLUCOSE mg/dL 853* 875* 737* 722* 725* 143* 114* 190* 199* 396* 137* 90   < > 159*   < > = values in this interval not displayed.    Hospital Course   Physicians involved in care during this hospitalization Attending Provider: Deward Gilmore Constant, MD Attending Provider: Balinda GORMAN Short, DO Attending Provider: Thayne GORMAN Blanch, MD Attending Provider: Atlee Abernethy, MD Admitting Provider: Balinda GORMAN Short, DO Consulting Physician: Cathlean FORBES Lanius, MD Consulting Physician: [facility] Consult To [facility] Card[facility]    [facility] Course:     89 yo F who presents with acute on chronic diastolic CHF. Cardiology consulted. ECHO LVEF 55-60%. She received IV lasix  40 mg tid. Cr noted to bump up from 1.23 -->1.53 on 09/15/2024. IV lasix  was then switched back to her home dose of torsemide 40 mg daily. Cardio increased the pt's aldactone to 50 mg PO daily. IV ceftriaxone  was used as the pt had L lower leg erythema s/p cancer lesion removal. She will go home with 5 more days of PO cefdinir .   Prior to the discharge, spoke with the pt and pt's family at the bedside regarding fluid restriction (1.5L per day) and salt restriction (2g Na+ per day). After my conversation, there appears  to be some food non-compliance with the diet recommendations prior to this admission. I advised that these restrictions were important to prevent re-admissions to the hospital. Medication compliance with the diuretics are also critical to prevent re-admission to the hospital. Pt and family understood and acknowledged.  BP 130/62 (BP Location: Right Upper Arm, Patient Position: Sitting)   Pulse 97   Temp 97.5 F (36.4 C) (Oral)   Resp 20   Ht 1.575 m (5' 2.01)   Wt 81.1 kg (178 lb 11.3 oz)   SpO2 95%   BMI 32.68 kg/m   Physical Exam HENT:     Head: Normocephalic.     Mouth/Throat:     Mouth: Mucous membranes are moist.  Cardiovascular:     Rate and Rhythm: Normal rate.  Pulmonary:     Effort: Pulmonary effort is normal.  Abdominal:     Palpations: Abdomen is soft.  Musculoskeletal:        General: Normal range of motion.  Skin:    General: Skin is warm.  Neurological:     Mental Status: She is alert. Mental status is at baseline.  Psychiatric:        Mood and Affect: Mood normal.     Post Hospital Care     Oxygen Orders for Discharge: O2 Device: None (Room air) SpO2: 95 %  Diet: Diet and Nourishment Orders (From admission, onward)     Start  09/11/24 1257  Consistent Carbohydrate 2 gm NA  Diet effective now       Question:  Na restriction, if any:  Answer:  2 gm NA                  Lines/Drains/Airways: Patient Lines/Drains/Airways Status     Active LDAs     Name Placement date Placement time Site Days   Peripheral IV 20 G Anterior;Left Upper Arm 09/14/24  1320  Upper Arm  1            Therapy Recommendations:  PT: Anticipated PT needs at discharge: Home Health PT (if qualifications met) Anticipated Caregiver Needs at Discharge: Supervision for safety/cues DME Equipment Recommendations: Has all needed equipment     AM-PAC Basic Mobility Raw Score (out of 24): 19 Routine Mobility Goal: Walk 10 Steps or More & Toilet in Bathroom  6  OT:          SLP:              Home Health Orders: DME Orders (From admission, onward)    None      Home Health Agency             Ambulatory Referral to Home Health       Question Answer Comment  Home Health Disciplines: Skilled Nursing   Home Health Disciplines: Physical Therapy   Reason for Skilled Nursing (enter specifics in comment boxes): Disease/Medication Management   Home health services are needed due to: balance issues, frequent falls, risk for falls related to condition   This certifies this patient requires services in the home due to: poor circulation, numbness, weakness, paralysis or pain in LEs, causing balance or gait disorders   Provider to Follow Patient: PCP   Patient aware of referral? Yes                 I spent 35 minutes performing discharge services.   Electronically signed: Atlee Abernethy, MD 09/16/2024 / 8:44 AM       [1] Allergies Allergen Reactions  . Iodinated Diagnostic Agents Shortness Of Breath  . Doxycycline  Other    Severe oral ulcers.  . Lovastatin  Other    myalgias  *Some images could not be shown.

## 2024-09-17 ENCOUNTER — Telehealth: Payer: Self-pay

## 2024-09-17 NOTE — Transitions of Care (Post Inpatient/ED Visit) (Signed)
   09/17/2024  Name: SHEMICKA COHRS MRN: [id number] DOB: April 09, 1935  Today's TOC FU Call Status: Today's TOC FU Call Status:: Unsuccessful Call (1st Attempt) Unsuccessful Call (1st Attempt) Date: 09/17/24  Attempted to reach the patient regarding the most recent Inpatient/ED visit.  Follow Up Plan: Additional outreach attempts will be made to reach the patient to complete the Transitions of Care (Post Inpatient/ED visit) call.   Medford Balboa, BSN, RN [place]  [facility] - [facility] RN Care Manager [phone]

## 2024-09-18 ENCOUNTER — Telehealth: Payer: Self-pay

## 2024-09-18 NOTE — Transitions of Care (Post Inpatient/ED Visit) (Signed)
   09/18/2024  Name: Stacey Cooper MRN: [id number] DOB: 27-Dec-1934  Today's TOC FU Call Status: Today's TOC FU Call Status:: Unsuccessful Call (2nd Attempt) Unsuccessful Call (1st Attempt) Date: 09/17/24 Unsuccessful Call (2nd Attempt) Date: 09/18/24  Attempted to reach the patient regarding the most recent Inpatient/ED visit.  Follow Up Plan: Additional outreach attempts will be made to reach the patient to complete the Transitions of Care (Post Inpatient/ED visit) call.   Medford Balboa, BSN, RN [place]  [facility] - [facility] RN Care Manager [phone]

## 2024-09-27 DIAGNOSIS — E785 Hyperlipidemia, unspecified: Secondary | ICD-10-CM | POA: Diagnosis not present

## 2024-09-27 DIAGNOSIS — N1832 Chronic kidney disease, stage 3b: Secondary | ICD-10-CM | POA: Diagnosis not present

## 2024-09-27 DIAGNOSIS — Z9851 Tubal ligation status: Secondary | ICD-10-CM | POA: Diagnosis not present

## 2024-09-27 DIAGNOSIS — I480 Paroxysmal atrial fibrillation: Secondary | ICD-10-CM | POA: Diagnosis not present

## 2024-09-27 DIAGNOSIS — E1122 Type 2 diabetes mellitus with diabetic chronic kidney disease: Secondary | ICD-10-CM | POA: Diagnosis not present

## 2024-09-27 DIAGNOSIS — Z556 Problems related to health literacy: Secondary | ICD-10-CM | POA: Diagnosis not present

## 2024-09-27 DIAGNOSIS — Z7901 Long term (current) use of anticoagulants: Secondary | ICD-10-CM | POA: Diagnosis not present

## 2024-09-27 DIAGNOSIS — I5033 Acute on chronic diastolic (congestive) heart failure: Secondary | ICD-10-CM | POA: Diagnosis not present

## 2024-09-27 DIAGNOSIS — K7460 Unspecified cirrhosis of liver: Secondary | ICD-10-CM | POA: Diagnosis not present

## 2024-09-27 DIAGNOSIS — Z96653 Presence of artificial knee joint, bilateral: Secondary | ICD-10-CM | POA: Diagnosis not present

## 2024-09-27 DIAGNOSIS — I130 Hypertensive heart and chronic kidney disease with heart failure and stage 1 through stage 4 chronic kidney disease, or unspecified chronic kidney disease: Secondary | ICD-10-CM | POA: Diagnosis not present

## 2024-09-27 DIAGNOSIS — Z90710 Acquired absence of both cervix and uterus: Secondary | ICD-10-CM | POA: Diagnosis not present

## 2024-09-27 DIAGNOSIS — Z7984 Long term (current) use of oral hypoglycemic drugs: Secondary | ICD-10-CM | POA: Diagnosis not present

## 2024-09-27 DIAGNOSIS — R32 Unspecified urinary incontinence: Secondary | ICD-10-CM | POA: Diagnosis not present

## 2024-09-30 DIAGNOSIS — I480 Paroxysmal atrial fibrillation: Secondary | ICD-10-CM | POA: Diagnosis not present

## 2024-09-30 DIAGNOSIS — Z9851 Tubal ligation status: Secondary | ICD-10-CM | POA: Diagnosis not present

## 2024-09-30 DIAGNOSIS — Z90710 Acquired absence of both cervix and uterus: Secondary | ICD-10-CM | POA: Diagnosis not present

## 2024-09-30 DIAGNOSIS — Z556 Problems related to health literacy: Secondary | ICD-10-CM | POA: Diagnosis not present

## 2024-09-30 DIAGNOSIS — I130 Hypertensive heart and chronic kidney disease with heart failure and stage 1 through stage 4 chronic kidney disease, or unspecified chronic kidney disease: Secondary | ICD-10-CM | POA: Diagnosis not present

## 2024-09-30 DIAGNOSIS — I5033 Acute on chronic diastolic (congestive) heart failure: Secondary | ICD-10-CM | POA: Diagnosis not present

## 2024-09-30 DIAGNOSIS — E1122 Type 2 diabetes mellitus with diabetic chronic kidney disease: Secondary | ICD-10-CM | POA: Diagnosis not present

## 2024-09-30 DIAGNOSIS — Z96653 Presence of artificial knee joint, bilateral: Secondary | ICD-10-CM | POA: Diagnosis not present

## 2024-09-30 DIAGNOSIS — Z7984 Long term (current) use of oral hypoglycemic drugs: Secondary | ICD-10-CM | POA: Diagnosis not present

## 2024-09-30 DIAGNOSIS — K7460 Unspecified cirrhosis of liver: Secondary | ICD-10-CM | POA: Diagnosis not present

## 2024-09-30 DIAGNOSIS — N1832 Chronic kidney disease, stage 3b: Secondary | ICD-10-CM | POA: Diagnosis not present

## 2024-09-30 DIAGNOSIS — E785 Hyperlipidemia, unspecified: Secondary | ICD-10-CM | POA: Diagnosis not present

## 2024-09-30 DIAGNOSIS — Z7901 Long term (current) use of anticoagulants: Secondary | ICD-10-CM | POA: Diagnosis not present

## 2024-10-01 DIAGNOSIS — R32 Unspecified urinary incontinence: Secondary | ICD-10-CM | POA: Diagnosis not present

## 2024-10-01 DIAGNOSIS — E785 Hyperlipidemia, unspecified: Secondary | ICD-10-CM | POA: Diagnosis not present

## 2024-10-01 DIAGNOSIS — Z7901 Long term (current) use of anticoagulants: Secondary | ICD-10-CM | POA: Diagnosis not present

## 2024-10-01 DIAGNOSIS — Z7984 Long term (current) use of oral hypoglycemic drugs: Secondary | ICD-10-CM | POA: Diagnosis not present

## 2024-10-01 DIAGNOSIS — Z9851 Tubal ligation status: Secondary | ICD-10-CM | POA: Diagnosis not present

## 2024-10-01 DIAGNOSIS — K7460 Unspecified cirrhosis of liver: Secondary | ICD-10-CM | POA: Diagnosis not present

## 2024-10-01 DIAGNOSIS — I5033 Acute on chronic diastolic (congestive) heart failure: Secondary | ICD-10-CM | POA: Diagnosis not present

## 2024-10-01 DIAGNOSIS — Z96653 Presence of artificial knee joint, bilateral: Secondary | ICD-10-CM | POA: Diagnosis not present

## 2024-10-01 DIAGNOSIS — E1122 Type 2 diabetes mellitus with diabetic chronic kidney disease: Secondary | ICD-10-CM | POA: Diagnosis not present

## 2024-10-01 DIAGNOSIS — Z90710 Acquired absence of both cervix and uterus: Secondary | ICD-10-CM | POA: Diagnosis not present

## 2024-10-01 DIAGNOSIS — Z556 Problems related to health literacy: Secondary | ICD-10-CM | POA: Diagnosis not present

## 2024-10-01 DIAGNOSIS — I480 Paroxysmal atrial fibrillation: Secondary | ICD-10-CM | POA: Diagnosis not present

## 2024-10-01 DIAGNOSIS — N1832 Chronic kidney disease, stage 3b: Secondary | ICD-10-CM | POA: Diagnosis not present

## 2024-10-01 DIAGNOSIS — I130 Hypertensive heart and chronic kidney disease with heart failure and stage 1 through stage 4 chronic kidney disease, or unspecified chronic kidney disease: Secondary | ICD-10-CM | POA: Diagnosis not present

## 2024-10-04 DIAGNOSIS — Z7901 Long term (current) use of anticoagulants: Secondary | ICD-10-CM | POA: Diagnosis not present

## 2024-10-04 DIAGNOSIS — R32 Unspecified urinary incontinence: Secondary | ICD-10-CM | POA: Diagnosis not present

## 2024-10-04 DIAGNOSIS — E1122 Type 2 diabetes mellitus with diabetic chronic kidney disease: Secondary | ICD-10-CM | POA: Diagnosis not present

## 2024-10-04 DIAGNOSIS — I480 Paroxysmal atrial fibrillation: Secondary | ICD-10-CM | POA: Diagnosis not present

## 2024-10-04 DIAGNOSIS — Z96653 Presence of artificial knee joint, bilateral: Secondary | ICD-10-CM | POA: Diagnosis not present

## 2024-10-04 DIAGNOSIS — Z7984 Long term (current) use of oral hypoglycemic drugs: Secondary | ICD-10-CM | POA: Diagnosis not present

## 2024-10-04 DIAGNOSIS — I130 Hypertensive heart and chronic kidney disease with heart failure and stage 1 through stage 4 chronic kidney disease, or unspecified chronic kidney disease: Secondary | ICD-10-CM | POA: Diagnosis not present

## 2024-10-04 DIAGNOSIS — K7460 Unspecified cirrhosis of liver: Secondary | ICD-10-CM | POA: Diagnosis not present

## 2024-10-04 DIAGNOSIS — Z90710 Acquired absence of both cervix and uterus: Secondary | ICD-10-CM | POA: Diagnosis not present

## 2024-10-04 DIAGNOSIS — N1832 Chronic kidney disease, stage 3b: Secondary | ICD-10-CM | POA: Diagnosis not present

## 2024-10-04 DIAGNOSIS — Z556 Problems related to health literacy: Secondary | ICD-10-CM | POA: Diagnosis not present

## 2024-10-04 DIAGNOSIS — Z9851 Tubal ligation status: Secondary | ICD-10-CM | POA: Diagnosis not present

## 2024-10-04 DIAGNOSIS — E785 Hyperlipidemia, unspecified: Secondary | ICD-10-CM | POA: Diagnosis not present

## 2024-10-04 DIAGNOSIS — I5033 Acute on chronic diastolic (congestive) heart failure: Secondary | ICD-10-CM | POA: Diagnosis not present

## 2024-10-07 DIAGNOSIS — Z7984 Long term (current) use of oral hypoglycemic drugs: Secondary | ICD-10-CM | POA: Diagnosis not present

## 2024-10-07 DIAGNOSIS — I130 Hypertensive heart and chronic kidney disease with heart failure and stage 1 through stage 4 chronic kidney disease, or unspecified chronic kidney disease: Secondary | ICD-10-CM | POA: Diagnosis not present

## 2024-10-07 DIAGNOSIS — I5033 Acute on chronic diastolic (congestive) heart failure: Secondary | ICD-10-CM | POA: Diagnosis not present

## 2024-10-07 DIAGNOSIS — Z7901 Long term (current) use of anticoagulants: Secondary | ICD-10-CM | POA: Diagnosis not present

## 2024-10-07 DIAGNOSIS — E1122 Type 2 diabetes mellitus with diabetic chronic kidney disease: Secondary | ICD-10-CM | POA: Diagnosis not present

## 2024-10-07 DIAGNOSIS — N1832 Chronic kidney disease, stage 3b: Secondary | ICD-10-CM | POA: Diagnosis not present

## 2024-10-07 DIAGNOSIS — Z9851 Tubal ligation status: Secondary | ICD-10-CM | POA: Diagnosis not present

## 2024-10-07 DIAGNOSIS — Z96653 Presence of artificial knee joint, bilateral: Secondary | ICD-10-CM | POA: Diagnosis not present

## 2024-10-07 DIAGNOSIS — I480 Paroxysmal atrial fibrillation: Secondary | ICD-10-CM | POA: Diagnosis not present

## 2024-10-07 DIAGNOSIS — K7460 Unspecified cirrhosis of liver: Secondary | ICD-10-CM | POA: Diagnosis not present

## 2024-10-07 DIAGNOSIS — E785 Hyperlipidemia, unspecified: Secondary | ICD-10-CM | POA: Diagnosis not present

## 2024-10-07 DIAGNOSIS — Z556 Problems related to health literacy: Secondary | ICD-10-CM | POA: Diagnosis not present

## 2024-10-07 DIAGNOSIS — Z90710 Acquired absence of both cervix and uterus: Secondary | ICD-10-CM | POA: Diagnosis not present

## 2024-10-07 DIAGNOSIS — R32 Unspecified urinary incontinence: Secondary | ICD-10-CM | POA: Diagnosis not present

## 2024-10-08 DIAGNOSIS — I5033 Acute on chronic diastolic (congestive) heart failure: Secondary | ICD-10-CM | POA: Diagnosis not present

## 2024-10-14 DIAGNOSIS — Z556 Problems related to health literacy: Secondary | ICD-10-CM | POA: Diagnosis not present

## 2024-10-14 DIAGNOSIS — E785 Hyperlipidemia, unspecified: Secondary | ICD-10-CM | POA: Diagnosis not present

## 2024-10-14 DIAGNOSIS — I130 Hypertensive heart and chronic kidney disease with heart failure and stage 1 through stage 4 chronic kidney disease, or unspecified chronic kidney disease: Secondary | ICD-10-CM | POA: Diagnosis not present

## 2024-10-14 DIAGNOSIS — K7460 Unspecified cirrhosis of liver: Secondary | ICD-10-CM | POA: Diagnosis not present

## 2024-10-14 DIAGNOSIS — Z7901 Long term (current) use of anticoagulants: Secondary | ICD-10-CM | POA: Diagnosis not present

## 2024-10-14 DIAGNOSIS — I5033 Acute on chronic diastolic (congestive) heart failure: Secondary | ICD-10-CM | POA: Diagnosis not present

## 2024-10-14 DIAGNOSIS — Z90710 Acquired absence of both cervix and uterus: Secondary | ICD-10-CM | POA: Diagnosis not present

## 2024-10-14 DIAGNOSIS — N1832 Chronic kidney disease, stage 3b: Secondary | ICD-10-CM | POA: Diagnosis not present

## 2024-10-14 DIAGNOSIS — E1122 Type 2 diabetes mellitus with diabetic chronic kidney disease: Secondary | ICD-10-CM | POA: Diagnosis not present

## 2024-10-14 DIAGNOSIS — R32 Unspecified urinary incontinence: Secondary | ICD-10-CM | POA: Diagnosis not present

## 2024-10-14 DIAGNOSIS — I480 Paroxysmal atrial fibrillation: Secondary | ICD-10-CM | POA: Diagnosis not present

## 2024-10-14 DIAGNOSIS — Z96653 Presence of artificial knee joint, bilateral: Secondary | ICD-10-CM | POA: Diagnosis not present

## 2024-10-14 DIAGNOSIS — Z9851 Tubal ligation status: Secondary | ICD-10-CM | POA: Diagnosis not present

## 2024-10-14 DIAGNOSIS — Z7984 Long term (current) use of oral hypoglycemic drugs: Secondary | ICD-10-CM | POA: Diagnosis not present

## 2024-10-15 DIAGNOSIS — K5900 Constipation, unspecified: Secondary | ICD-10-CM | POA: Diagnosis not present

## 2024-10-15 DIAGNOSIS — E1165 Type 2 diabetes mellitus with hyperglycemia: Secondary | ICD-10-CM | POA: Diagnosis not present

## 2024-10-15 DIAGNOSIS — K6289 Other specified diseases of anus and rectum: Secondary | ICD-10-CM | POA: Diagnosis not present

## 2024-10-15 DIAGNOSIS — Z794 Long term (current) use of insulin: Secondary | ICD-10-CM | POA: Diagnosis not present

## 2024-10-18 DIAGNOSIS — Z9851 Tubal ligation status: Secondary | ICD-10-CM | POA: Diagnosis not present

## 2024-10-18 DIAGNOSIS — N1832 Chronic kidney disease, stage 3b: Secondary | ICD-10-CM | POA: Diagnosis not present

## 2024-10-18 DIAGNOSIS — E1122 Type 2 diabetes mellitus with diabetic chronic kidney disease: Secondary | ICD-10-CM | POA: Diagnosis not present

## 2024-10-18 DIAGNOSIS — R32 Unspecified urinary incontinence: Secondary | ICD-10-CM | POA: Diagnosis not present

## 2024-10-18 DIAGNOSIS — Z556 Problems related to health literacy: Secondary | ICD-10-CM | POA: Diagnosis not present

## 2024-10-18 DIAGNOSIS — Z7984 Long term (current) use of oral hypoglycemic drugs: Secondary | ICD-10-CM | POA: Diagnosis not present

## 2024-10-18 DIAGNOSIS — K7460 Unspecified cirrhosis of liver: Secondary | ICD-10-CM | POA: Diagnosis not present

## 2024-10-18 DIAGNOSIS — I5033 Acute on chronic diastolic (congestive) heart failure: Secondary | ICD-10-CM | POA: Diagnosis not present

## 2024-10-18 DIAGNOSIS — I480 Paroxysmal atrial fibrillation: Secondary | ICD-10-CM | POA: Diagnosis not present

## 2024-10-18 DIAGNOSIS — E785 Hyperlipidemia, unspecified: Secondary | ICD-10-CM | POA: Diagnosis not present

## 2024-10-18 DIAGNOSIS — Z90710 Acquired absence of both cervix and uterus: Secondary | ICD-10-CM | POA: Diagnosis not present

## 2024-10-18 DIAGNOSIS — I130 Hypertensive heart and chronic kidney disease with heart failure and stage 1 through stage 4 chronic kidney disease, or unspecified chronic kidney disease: Secondary | ICD-10-CM | POA: Diagnosis not present

## 2024-10-18 DIAGNOSIS — Z7901 Long term (current) use of anticoagulants: Secondary | ICD-10-CM | POA: Diagnosis not present

## 2024-10-21 DIAGNOSIS — E785 Hyperlipidemia, unspecified: Secondary | ICD-10-CM | POA: Diagnosis not present

## 2024-10-21 DIAGNOSIS — R32 Unspecified urinary incontinence: Secondary | ICD-10-CM | POA: Diagnosis not present

## 2024-10-21 DIAGNOSIS — Z96653 Presence of artificial knee joint, bilateral: Secondary | ICD-10-CM | POA: Diagnosis not present

## 2024-10-21 DIAGNOSIS — I480 Paroxysmal atrial fibrillation: Secondary | ICD-10-CM | POA: Diagnosis not present

## 2024-10-21 DIAGNOSIS — Z7901 Long term (current) use of anticoagulants: Secondary | ICD-10-CM | POA: Diagnosis not present

## 2024-10-21 DIAGNOSIS — Z556 Problems related to health literacy: Secondary | ICD-10-CM | POA: Diagnosis not present

## 2024-10-21 DIAGNOSIS — Z90710 Acquired absence of both cervix and uterus: Secondary | ICD-10-CM | POA: Diagnosis not present

## 2024-10-21 DIAGNOSIS — Z9851 Tubal ligation status: Secondary | ICD-10-CM | POA: Diagnosis not present

## 2024-10-21 DIAGNOSIS — I5033 Acute on chronic diastolic (congestive) heart failure: Secondary | ICD-10-CM | POA: Diagnosis not present

## 2024-10-21 DIAGNOSIS — Z7984 Long term (current) use of oral hypoglycemic drugs: Secondary | ICD-10-CM | POA: Diagnosis not present

## 2024-10-21 DIAGNOSIS — K7460 Unspecified cirrhosis of liver: Secondary | ICD-10-CM | POA: Diagnosis not present

## 2024-10-21 DIAGNOSIS — I130 Hypertensive heart and chronic kidney disease with heart failure and stage 1 through stage 4 chronic kidney disease, or unspecified chronic kidney disease: Secondary | ICD-10-CM | POA: Diagnosis not present

## 2024-10-21 DIAGNOSIS — E1122 Type 2 diabetes mellitus with diabetic chronic kidney disease: Secondary | ICD-10-CM | POA: Diagnosis not present

## 2024-10-21 DIAGNOSIS — N1832 Chronic kidney disease, stage 3b: Secondary | ICD-10-CM | POA: Diagnosis not present

## 2024-10-29 DIAGNOSIS — Z7984 Long term (current) use of oral hypoglycemic drugs: Secondary | ICD-10-CM | POA: Diagnosis not present

## 2024-10-29 DIAGNOSIS — N1832 Chronic kidney disease, stage 3b: Secondary | ICD-10-CM | POA: Diagnosis not present

## 2024-10-29 DIAGNOSIS — I130 Hypertensive heart and chronic kidney disease with heart failure and stage 1 through stage 4 chronic kidney disease, or unspecified chronic kidney disease: Secondary | ICD-10-CM | POA: Diagnosis not present

## 2024-10-29 DIAGNOSIS — Z7901 Long term (current) use of anticoagulants: Secondary | ICD-10-CM | POA: Diagnosis not present

## 2024-10-29 DIAGNOSIS — E785 Hyperlipidemia, unspecified: Secondary | ICD-10-CM | POA: Diagnosis not present

## 2024-10-29 DIAGNOSIS — E1122 Type 2 diabetes mellitus with diabetic chronic kidney disease: Secondary | ICD-10-CM | POA: Diagnosis not present

## 2024-10-29 DIAGNOSIS — K7460 Unspecified cirrhosis of liver: Secondary | ICD-10-CM | POA: Diagnosis not present

## 2024-10-29 DIAGNOSIS — Z9851 Tubal ligation status: Secondary | ICD-10-CM | POA: Diagnosis not present

## 2024-10-29 DIAGNOSIS — I5033 Acute on chronic diastolic (congestive) heart failure: Secondary | ICD-10-CM | POA: Diagnosis not present

## 2024-10-29 DIAGNOSIS — Z556 Problems related to health literacy: Secondary | ICD-10-CM | POA: Diagnosis not present

## 2024-10-29 DIAGNOSIS — Z96653 Presence of artificial knee joint, bilateral: Secondary | ICD-10-CM | POA: Diagnosis not present

## 2024-10-29 DIAGNOSIS — Z90710 Acquired absence of both cervix and uterus: Secondary | ICD-10-CM | POA: Diagnosis not present

## 2024-10-29 DIAGNOSIS — R32 Unspecified urinary incontinence: Secondary | ICD-10-CM | POA: Diagnosis not present

## 2024-10-29 DIAGNOSIS — I480 Paroxysmal atrial fibrillation: Secondary | ICD-10-CM | POA: Diagnosis not present

## 2024-10-31 DIAGNOSIS — I4819 Other persistent atrial fibrillation: Secondary | ICD-10-CM | POA: Diagnosis not present

## 2024-10-31 DIAGNOSIS — I5033 Acute on chronic diastolic (congestive) heart failure: Secondary | ICD-10-CM | POA: Diagnosis not present

## 2024-10-31 DIAGNOSIS — R600 Localized edema: Secondary | ICD-10-CM | POA: Diagnosis not present

## 2024-10-31 DIAGNOSIS — I10 Essential (primary) hypertension: Secondary | ICD-10-CM | POA: Diagnosis not present

## 2024-11-06 DIAGNOSIS — Z96653 Presence of artificial knee joint, bilateral: Secondary | ICD-10-CM | POA: Diagnosis not present

## 2024-11-06 DIAGNOSIS — I480 Paroxysmal atrial fibrillation: Secondary | ICD-10-CM | POA: Diagnosis not present

## 2024-11-06 DIAGNOSIS — I130 Hypertensive heart and chronic kidney disease with heart failure and stage 1 through stage 4 chronic kidney disease, or unspecified chronic kidney disease: Secondary | ICD-10-CM | POA: Diagnosis not present

## 2024-11-06 DIAGNOSIS — N1832 Chronic kidney disease, stage 3b: Secondary | ICD-10-CM | POA: Diagnosis not present

## 2024-11-06 DIAGNOSIS — Z7901 Long term (current) use of anticoagulants: Secondary | ICD-10-CM | POA: Diagnosis not present

## 2024-11-06 DIAGNOSIS — E1122 Type 2 diabetes mellitus with diabetic chronic kidney disease: Secondary | ICD-10-CM | POA: Diagnosis not present

## 2024-11-06 DIAGNOSIS — Z9851 Tubal ligation status: Secondary | ICD-10-CM | POA: Diagnosis not present

## 2024-11-06 DIAGNOSIS — Z7984 Long term (current) use of oral hypoglycemic drugs: Secondary | ICD-10-CM | POA: Diagnosis not present

## 2024-11-06 DIAGNOSIS — Z90710 Acquired absence of both cervix and uterus: Secondary | ICD-10-CM | POA: Diagnosis not present

## 2024-11-06 DIAGNOSIS — K7460 Unspecified cirrhosis of liver: Secondary | ICD-10-CM | POA: Diagnosis not present

## 2024-11-06 DIAGNOSIS — I5033 Acute on chronic diastolic (congestive) heart failure: Secondary | ICD-10-CM | POA: Diagnosis not present

## 2024-11-06 DIAGNOSIS — Z556 Problems related to health literacy: Secondary | ICD-10-CM | POA: Diagnosis not present

## 2024-11-06 DIAGNOSIS — E785 Hyperlipidemia, unspecified: Secondary | ICD-10-CM | POA: Diagnosis not present

## 2024-11-06 DIAGNOSIS — R32 Unspecified urinary incontinence: Secondary | ICD-10-CM | POA: Diagnosis not present

## 2024-11-07 DIAGNOSIS — R32 Unspecified urinary incontinence: Secondary | ICD-10-CM | POA: Diagnosis not present

## 2024-11-07 DIAGNOSIS — Z9851 Tubal ligation status: Secondary | ICD-10-CM | POA: Diagnosis not present

## 2024-11-07 DIAGNOSIS — K7460 Unspecified cirrhosis of liver: Secondary | ICD-10-CM | POA: Diagnosis not present

## 2024-11-07 DIAGNOSIS — E1122 Type 2 diabetes mellitus with diabetic chronic kidney disease: Secondary | ICD-10-CM | POA: Diagnosis not present

## 2024-11-07 DIAGNOSIS — Z90710 Acquired absence of both cervix and uterus: Secondary | ICD-10-CM | POA: Diagnosis not present

## 2024-11-07 DIAGNOSIS — Z556 Problems related to health literacy: Secondary | ICD-10-CM | POA: Diagnosis not present

## 2024-11-07 DIAGNOSIS — Z7901 Long term (current) use of anticoagulants: Secondary | ICD-10-CM | POA: Diagnosis not present

## 2024-11-07 DIAGNOSIS — N1832 Chronic kidney disease, stage 3b: Secondary | ICD-10-CM | POA: Diagnosis not present

## 2024-11-07 DIAGNOSIS — I130 Hypertensive heart and chronic kidney disease with heart failure and stage 1 through stage 4 chronic kidney disease, or unspecified chronic kidney disease: Secondary | ICD-10-CM | POA: Diagnosis not present

## 2024-11-07 DIAGNOSIS — I480 Paroxysmal atrial fibrillation: Secondary | ICD-10-CM | POA: Diagnosis not present

## 2024-11-07 DIAGNOSIS — Z7984 Long term (current) use of oral hypoglycemic drugs: Secondary | ICD-10-CM | POA: Diagnosis not present

## 2024-11-07 DIAGNOSIS — E785 Hyperlipidemia, unspecified: Secondary | ICD-10-CM | POA: Diagnosis not present

## 2024-11-07 DIAGNOSIS — I5033 Acute on chronic diastolic (congestive) heart failure: Secondary | ICD-10-CM | POA: Diagnosis not present

## 2024-11-07 DIAGNOSIS — Z96653 Presence of artificial knee joint, bilateral: Secondary | ICD-10-CM | POA: Diagnosis not present

## 2024-11-11 DIAGNOSIS — Z96653 Presence of artificial knee joint, bilateral: Secondary | ICD-10-CM | POA: Diagnosis not present

## 2024-11-11 DIAGNOSIS — I480 Paroxysmal atrial fibrillation: Secondary | ICD-10-CM | POA: Diagnosis not present

## 2024-11-11 DIAGNOSIS — E785 Hyperlipidemia, unspecified: Secondary | ICD-10-CM | POA: Diagnosis not present

## 2024-11-11 DIAGNOSIS — R32 Unspecified urinary incontinence: Secondary | ICD-10-CM | POA: Diagnosis not present

## 2024-11-11 DIAGNOSIS — I130 Hypertensive heart and chronic kidney disease with heart failure and stage 1 through stage 4 chronic kidney disease, or unspecified chronic kidney disease: Secondary | ICD-10-CM | POA: Diagnosis not present

## 2024-11-11 DIAGNOSIS — Z90710 Acquired absence of both cervix and uterus: Secondary | ICD-10-CM | POA: Diagnosis not present

## 2024-11-11 DIAGNOSIS — I5033 Acute on chronic diastolic (congestive) heart failure: Secondary | ICD-10-CM | POA: Diagnosis not present

## 2024-11-11 DIAGNOSIS — K7460 Unspecified cirrhosis of liver: Secondary | ICD-10-CM | POA: Diagnosis not present

## 2024-11-11 DIAGNOSIS — Z7901 Long term (current) use of anticoagulants: Secondary | ICD-10-CM | POA: Diagnosis not present

## 2024-11-11 DIAGNOSIS — Z7984 Long term (current) use of oral hypoglycemic drugs: Secondary | ICD-10-CM | POA: Diagnosis not present

## 2024-11-11 DIAGNOSIS — N1832 Chronic kidney disease, stage 3b: Secondary | ICD-10-CM | POA: Diagnosis not present

## 2024-11-11 DIAGNOSIS — Z9851 Tubal ligation status: Secondary | ICD-10-CM | POA: Diagnosis not present

## 2024-11-11 DIAGNOSIS — Z556 Problems related to health literacy: Secondary | ICD-10-CM | POA: Diagnosis not present

## 2024-11-11 DIAGNOSIS — E1122 Type 2 diabetes mellitus with diabetic chronic kidney disease: Secondary | ICD-10-CM | POA: Diagnosis not present

## 2024-11-12 DIAGNOSIS — I130 Hypertensive heart and chronic kidney disease with heart failure and stage 1 through stage 4 chronic kidney disease, or unspecified chronic kidney disease: Secondary | ICD-10-CM | POA: Diagnosis not present

## 2024-11-12 DIAGNOSIS — K7460 Unspecified cirrhosis of liver: Secondary | ICD-10-CM | POA: Diagnosis not present

## 2024-11-12 DIAGNOSIS — I5033 Acute on chronic diastolic (congestive) heart failure: Secondary | ICD-10-CM | POA: Diagnosis not present

## 2024-11-12 DIAGNOSIS — E785 Hyperlipidemia, unspecified: Secondary | ICD-10-CM | POA: Diagnosis not present

## 2024-11-12 DIAGNOSIS — Z7984 Long term (current) use of oral hypoglycemic drugs: Secondary | ICD-10-CM | POA: Diagnosis not present

## 2024-11-12 DIAGNOSIS — Z556 Problems related to health literacy: Secondary | ICD-10-CM | POA: Diagnosis not present

## 2024-11-12 DIAGNOSIS — Z9851 Tubal ligation status: Secondary | ICD-10-CM | POA: Diagnosis not present

## 2024-11-12 DIAGNOSIS — E1122 Type 2 diabetes mellitus with diabetic chronic kidney disease: Secondary | ICD-10-CM | POA: Diagnosis not present

## 2024-11-12 DIAGNOSIS — Z90710 Acquired absence of both cervix and uterus: Secondary | ICD-10-CM | POA: Diagnosis not present

## 2024-11-12 DIAGNOSIS — N1832 Chronic kidney disease, stage 3b: Secondary | ICD-10-CM | POA: Diagnosis not present

## 2024-11-12 DIAGNOSIS — Z96653 Presence of artificial knee joint, bilateral: Secondary | ICD-10-CM | POA: Diagnosis not present

## 2024-11-12 DIAGNOSIS — Z7901 Long term (current) use of anticoagulants: Secondary | ICD-10-CM | POA: Diagnosis not present

## 2024-11-12 DIAGNOSIS — R32 Unspecified urinary incontinence: Secondary | ICD-10-CM | POA: Diagnosis not present

## 2024-11-12 DIAGNOSIS — I480 Paroxysmal atrial fibrillation: Secondary | ICD-10-CM | POA: Diagnosis not present

## 2024-11-15 DIAGNOSIS — I5033 Acute on chronic diastolic (congestive) heart failure: Secondary | ICD-10-CM | POA: Diagnosis not present

## 2024-11-15 DIAGNOSIS — J9601 Acute respiratory failure with hypoxia: Secondary | ICD-10-CM | POA: Diagnosis not present

## 2024-11-15 DIAGNOSIS — R4182 Altered mental status, unspecified: Secondary | ICD-10-CM | POA: Diagnosis not present

## 2024-11-15 DIAGNOSIS — R0602 Shortness of breath: Secondary | ICD-10-CM | POA: Diagnosis not present

## 2024-11-15 DIAGNOSIS — R600 Localized edema: Secondary | ICD-10-CM | POA: Diagnosis not present

## 2024-11-16 DIAGNOSIS — K7581 Nonalcoholic steatohepatitis (NASH): Secondary | ICD-10-CM | POA: Diagnosis not present

## 2024-11-16 DIAGNOSIS — R600 Localized edema: Secondary | ICD-10-CM | POA: Diagnosis not present

## 2024-11-16 DIAGNOSIS — I480 Paroxysmal atrial fibrillation: Secondary | ICD-10-CM | POA: Diagnosis not present

## 2024-11-16 DIAGNOSIS — R0902 Hypoxemia: Secondary | ICD-10-CM | POA: Diagnosis not present

## 2024-11-16 DIAGNOSIS — I10 Essential (primary) hypertension: Secondary | ICD-10-CM | POA: Diagnosis not present

## 2024-11-16 DIAGNOSIS — R0609 Other forms of dyspnea: Secondary | ICD-10-CM | POA: Diagnosis not present

## 2024-11-16 DIAGNOSIS — R918 Other nonspecific abnormal finding of lung field: Secondary | ICD-10-CM | POA: Diagnosis not present

## 2024-11-16 DIAGNOSIS — K7460 Unspecified cirrhosis of liver: Secondary | ICD-10-CM | POA: Diagnosis not present

## 2024-11-16 DIAGNOSIS — E119 Type 2 diabetes mellitus without complications: Secondary | ICD-10-CM | POA: Diagnosis not present

## 2024-11-17 DIAGNOSIS — K7460 Unspecified cirrhosis of liver: Secondary | ICD-10-CM | POA: Diagnosis not present

## 2024-11-17 DIAGNOSIS — K7581 Nonalcoholic steatohepatitis (NASH): Secondary | ICD-10-CM | POA: Diagnosis not present

## 2024-11-17 DIAGNOSIS — I480 Paroxysmal atrial fibrillation: Secondary | ICD-10-CM | POA: Diagnosis not present

## 2024-11-17 DIAGNOSIS — R600 Localized edema: Secondary | ICD-10-CM | POA: Diagnosis not present

## 2024-11-17 DIAGNOSIS — E119 Type 2 diabetes mellitus without complications: Secondary | ICD-10-CM | POA: Diagnosis not present

## 2024-11-17 DIAGNOSIS — R0609 Other forms of dyspnea: Secondary | ICD-10-CM | POA: Diagnosis not present

## 2024-11-17 DIAGNOSIS — J9601 Acute respiratory failure with hypoxia: Secondary | ICD-10-CM | POA: Diagnosis not present

## 2024-11-17 NOTE — Discharge Summary (Addendum)
 [facility] Discharge Summary  PCP: Rosina LITTIE Bullock, PA-C Discharge Details   Admit date:         11/15/2024 Discharge date:         11/17/2024  Hospital LOS:    1 days  Active Hospital Problems   Diagnosis Date Noted POA  . *Acute respiratory failure with hypoxia (*) 11/15/2024 Yes  . Peripheral edema 11/15/2024 Yes  . Metabolic dysfunction-associated steatohepatitis (MASH) 02/08/2024 Yes  . Non-alcoholic cirrhosis (*) 02/08/2024 Yes  . Paroxysmal atrial fibrillation (*) 01/19/2024 Yes  . Type 2 diabetes mellitus without complication, without long-term current use of insulin  (*) 09/06/2023 Yes  . Essential hypertension 03/04/2022 Yes  . DOE (dyspnea on exertion) 07/05/2021 Yes    Resolved Hospital Problems  No resolved problems to display.      Current Discharge Medication List     START taking these medications      Details  metolazone 2.5 mg tablet Commonly known as: ZAROXOLYN Start taking on: November 19, 2024  Take one tablet (2.5 mg dose) by mouth once a week. Indication: Edema Quantity: 4 tablet       CONTINUE these medications which have NOT CHANGED      Details  apixaban 5 mg tablet Commonly known as: ELIQUIS  Take one tablet (5 mg dose) by mouth 2 (two) times daily. Indication: Atrial Fibrillation Quantity: 60 tablet   clotrimazole  1% cream Commonly known as: LOTRIMIN   Apply to affected area 2 times daily Quantity: 15 g   clotrimazole -betamethasone  cream Commonly known as: LOTRISONE   Apply topically as needed.   diltiazem HCl 60 mg 12 hr capsule Commonly known as: CARDIZEM ER  Take one capsule (60 mg dose) by mouth 2 (two) times daily. Quantity: 60 capsule   empagliflozin 10 mg Tabs tablet Commonly known as: JARDIANCE Start taking on: November 18, 2024  Take one tablet (10 mg dose) by mouth daily. Indication: Type 2 Diabetes Quantity: 30 tablet   glimepiride  2 mg tablet Commonly known as: AMARYL   Take  one tablet (2 mg dose) by mouth daily.   Insulin  Pen Needle 32G X 4 MM Misc Commonly known as: BD,SURE COMFORT,NOVOFINE  Use as directed Quantity: 100 each   metFORMIN  ER 500 mg 24 hr tablet Commonly known as: GLUCOPHAGE -XR  Take one tablet (500 mg dose) by mouth every morning.   NOVOLOG FLEXPEN 100 UNIT/ML injection Generic drug: insulin  aspart  If Blood sugars 201-250   3 units [place]                          251-300    6 units [place]                          301-350    8 units [place]                          351- 400   10 units [place]       Greater than 400          12 units [place] and call MD Quantity: 15 mL   ONETOUCH VERIO REFLECT w/Device Kit  USE DAILY AS INSTRUCTED   spironolactone 50 mg tablet Commonly known as: ALDACTONE  Take one tablet (50 mg dose) by mouth daily. Indication: Diastolic Heart Failure Quantity: 30 tablet   torsemide 20 mg tablet Commonly known as: DEMADEX  Take  two tablets (40 mg dose) by mouth daily.      * You might also be taking other medications not listed above. If you have questions about any of your other medications, talk to the person who prescribed them or your Primary Care Provider.          Hospital Course   Indication for Admission: SOB  History of Present Illness & Hospital Course:       79F with HFpEF, paroxysmal atrial fibrillation, and T2DM admitted for dyspnea on exertion and hypoxia to 86% on room air with ambulation in the ER.  Hospital course as follows:   # Acute Hypoxic Respiratory Failure with Hypoxia -Oxygen saturations dropped to 86% on room air with ambulation in the ER, improving to 95% or above with rest. CXR unremarkable for pulmonary edema or pleural effusions. No leukocytosis. ProBNP 260. Last echocardiogram 09/11/2024 showed LVEF 55-60% with mild diastolic dysfunction and trace valvular regurgitation.  - On hospital day 2, she remained stable on RA at rest and w/o any apparent edema or weeping.  Reviewed hx and presentation w/ dtr  who was concerned for increased edema and prevention of need to return to the hospital as well as exertional hypoxia and did proceed w/ CT chest w/o contrast w/ mild edema seen.  Did increase her home torsemide to 40 bid and repeated ambulating pulse ox the following day and she remained stable in the 90s on RA w/ ambulation. - Echo w/o acute change from prior.  High, nl Systolic function 65% EF, nl LV size.  In addition, ProBNP negative.  EKG w/ 1st degree AVB, similar to prior and cardiac enzymes flat.  CBC w/o leukocytosis and nl differential.  - she did well w/ PT, AMPAC 18 and HH PT recommended - Case Management consulted for disposition planning      Hypertension, Hyperlipidemia - Last echocardiogram 09/11/2024 with LVEF 55-60%, mild diastolic dysfunction, and trace valvular regurgitation. Appears euvolemic on exam and no significant change on repeat echo and prior echo w/ mild diastolic dysfunction which would not be expected to result in clinical HF.  I do not appreciate significant valvular dysfunction or increased RVSP.  She does follow w/ Dr. Isabell and will continue to do so.   -- continued diltiazem, empagliflozin, spironolactone     # acute on Chronic lower extremity edema, likely 2/2 chronic venous insufficiency and dramatically improved on exam on the 2nd hospital day w/ use of lasix  20mg  IV x1 at presentation only.  Did resume home torsemide and did ask cardiology to eval as well as niece was concerned for persistent significant edema and recent weeping, neither of which were appreciated by myself on exam.  Did review w/ Dr. Lilian and will continue prior home torsemide 20bid at dc and add once weekly metolazone with weekly labs to monitor Cr and close outpt f/u w/ cardiology to re-eval exam and will send message to Dr. Chalmers office to update as well.    ProBNP WNL at admission and pt completely comfortable supine.  Will plan for initial metolazone dose to be administered on Tuesday  given recent lasix  w/ improvement on exam while here. - continuing lower extremity elevation, low Na diet and outpt f/u - LE US  negative VTE   # Paroxysmal Atrial Fibrillation on Chronic Anticoagulation  - EKG shows sinus rhythm with first-degree AV block.  - continue home diltiazem, apixaban    # Type 2 Diabetes Mellitus, A1c 8.  - continue home empagliflozin, glimepiride , metformin   and PCP f/u to continue to monitor   # Metabolic Dysfunction-Associated Steatohepatitis, Cirrhosis Stable. Continue home meds   # Intermittent confusion per family report a/w activity and w/ waking/after sleeping where they note that she is more confused after waking and after ambulating-did proceed as detailed above and she has done well w/ ambulation and maintaining SpO2 in the 90s on the day of discharge.  There has been no concern for confusion while here.   Will refer for sleep study at dc  Bedside Procedures   No orders found     [facility] Care   Discharge Procedure Orders  Ambulatory Referral to Home Health  Referral Priority: Routine Referral Type: Home Health Care  Referral Reason: Evaluate and Return  Requested Specialty: Home Health Services  Number of Visits Requested: 1 Expiration Date: 05/15/25   Ambulatory referral to Sleep Studies  Standing Status: Future  Referral Priority: Urgent Referral Type: Consultation  Referral Reason: Evaluate and Return  Number of Visits Requested: 1 Expiration Date: 05/15/25   Discharge instructions  Order Comments: Please continue all prior home meds as you were previously taking them.  Will add metolazone 2.5mg  to start on Tuesday and to continue weekly with close outpatient follow up and repeat labs with Dr. Isabell as an outpatient.  Will message him and his office should reach out in the next few days to arrange for outpatient labs and re-eval but please do call his office if this does not occur or for increasing lower extremity edema or recurring  symptoms and need for sooner re-evaluation.   Appointments which have been scheduled    Jan 31, 2025 8:20 AM Follow Up Appointment with Redell DELENA Isabell, MD [facility] Cardiology [facility]) (--) [address]  [place] [address] [phone]       Recommendations to physicians:  Followup PCP 1 week  Followup Dr. Isabell w/in the next week  Rehab/Home health orders:       Resume [facility] PT/RN   Code Status:   Full Code   Time spent in discharge process:  35 minutes  This note was dictated with voice recognition software. Similar sounding words can inadvertently be transcribed and may not be corrected upon review  Electronically signed: Charleen DELENA Shutter, DO 11/17/2024 / 11:28 AM *Some images could not be shown.

## 2024-11-18 ENCOUNTER — Telehealth: Payer: Self-pay

## 2024-11-18 NOTE — Transitions of Care (Post Inpatient/ED Visit) (Signed)
   11/18/2024  Name: Stacey Cooper MRN: [id number] DOB: 13-Mar-1935  Today's TOC FU Call Status: Today's TOC FU Call Status:: Unsuccessful Call (1st Attempt) Unsuccessful Call (1st Attempt) Date: 11/18/24  Attempted to reach the patient regarding the most recent Inpatient/ED visit.  Follow Up Plan: Additional outreach attempts will be made to reach the patient to complete the Transitions of Care (Post Inpatient/ED visit) call.   Medford Balboa, BSN, RN [place]  VBCI - [employer] [facility] RN Care Manager [phone]

## 2024-11-19 ENCOUNTER — Telehealth: Payer: Self-pay

## 2024-11-19 NOTE — Transitions of Care (Post Inpatient/ED Visit) (Signed)
 11/19/2024  Name: Stacey Cooper MRN: [id number] DOB: [birth year]  Today's TOC FU Call Status: Today's TOC FU Call Status:: Successful TOC FU Call Completed TOC FU Call Complete Date: 11/19/24  Patient's Name and Date of Birth confirmed. Name, DOB  Transition Care Management Follow-up Telephone Call Date of Discharge: 11/17/24 Discharge Facility: Other ([facility]) Name of Other ([facility]) [facility]charge Facility: [facility] Type of Discharge: Inpatient Admission Primary Inpatient Discharge Diagnosis:: Hypoxia How have you been since you were released from the hospital?: Better Any questions or concerns?: No  Items Reviewed: Did you receive and understand the discharge instructions provided?: Yes Medications obtained,verified, and reconciled?: Partial Review Completed Reason for Partial Mediation Review: The patient's niece fixes her medications. she does not have metolazone yet Any new allergies since your discharge?: No Dietary orders reviewed?: Yes Type of Diet Ordered:: Low Salt Heart Healthy Do you have support at home?: Yes People in Home [RPT]: alone, other relative(s) Name of Support/Comfort Primary Source: Tanya Adkins  Medications Reviewed Today: Medications Reviewed Today   Medications were not reviewed in this encounter     Home Care and Equipment/Supplies: Were Home Health Services Ordered?: Yes Name of Home Health Agency:: [facility] Has Agency set up a time to come to your home?: No EMR reviewed for Home Health Orders: Orders present/patient has not received call (refer to CM for follow-up) Any new equipment or medical supplies ordered?: No  Functional Questionnaire: Do you need assistance with bathing/showering or dressing?: No Do you need assistance with meal preparation?: No Do you need assistance with eating?: No Do you have difficulty maintaining continence: Yes (Incontinent of urine) Do you need assistance with getting out of  bed/getting out of a chair/moving?: No Do you have difficulty managing or taking your medications?: Yes (The patient's niece manages her medications)  Follow up appointments reviewed: PCP Follow-up appointment confirmed?: No (The patient's niece will call. [name] provides the transportation) MD Provider Line [phone] Given: No Specialist Hospital Follow-up appointment confirmed?: Yes Date of Specialist follow-up appointment?: 01/31/25 Follow-Up Specialty Provider:: Dr. Isabell Do you need transportation to your follow-up appointment?: No Do you understand care options if your condition(s) worsen?: Yes-patient verbalized understanding  SDOH Interventions Today    Flowsheet Row Most Recent Value  SDOH Interventions   Food Insecurity Interventions Intervention Not Indicated  Housing Interventions Intervention Not Indicated  Transportation Interventions Intervention Not Indicated  Utilities Interventions Intervention Not Indicated    Goals Addressed             This Visit's Progress    VBCI Transitions of Care (TOC) Care Plan       Problems:  Recent Hospitalization for treatment of CHF No Hospital Follow Up Provider appointment The patient has not scheduled a hospital follow up appointment yet  Goal:  Over the next 30 days, the patient will not experience hospital readmission  Interventions:   Heart Failure Interventions: Provided education on low sodium diet Discussed importance of daily weight and advised patient to weigh and record daily Reviewed role of diuretics in prevention of fluid overload and management of heart failure; Discussed the importance of keeping all appointments with provider Provided patient with education about the role of exercise in the management of heart failure Elevate legs while sitting Participate in HHPT and SN visits through [facility]  Patient Self Care Activities:  Attend all scheduled provider appointments Attend church or  other social activities Call pharmacy for medication refills 3-7 days in advance of running out of medications  Notify RN Care Manager of TOC call rescheduling needs Participate in Transition of Care Program/Attend TOC scheduled calls Perform all self care activities independently  Take medications as prescribed    Plan:  Telephone follow up appointment with care management team member scheduled for:  Tuesday December 2nd at 2:00pm        [facility], BSN, RN   VBCI - [facility] RN Care Manager [phone]

## 2024-11-19 NOTE — Progress Notes (Signed)
 CARE COORDINATOR CONTACT WITH PATIENT/CAREGIVER AFTER HOSPITAL DISCHARGE   Telephone call placed, left voice mail message.  My chart message sent and  encouraged to schedule Hospital follow up appointment with their Non NH PCP.  Hello Stacey Cooper, this is [name], RN, BSN from [employer]. I tried reaching you to check in on your progress after your recent visit, but I was unable to get in touch. Please give us  a call back at [phone] at your earliest convenience. Unfortunately, you cannot respond to this message at this time.  Thank you!

## 2024-11-19 NOTE — Patient Instructions (Signed)
 Visit Information  Thank you for taking time to visit with me today. Please don't hesitate to contact me if I can be of assistance to you before our next scheduled telephone appointment.  Our next appointment is by telephone on Tuesday December 2nd at 2:00pm  Following is a copy of your care plan:   Goals Addressed             This Visit's Progress    VBCI Transitions of Care (TOC) Care Plan       Problems:  Recent Hospitalization for treatment of CHF No Hospital Follow Up Provider appointment The patient has not scheduled a hospital follow up appointment yet  Goal:  Over the next 30 days, the patient will not experience hospital readmission  Interventions:   Heart Failure Interventions: Provided education on low sodium diet Discussed importance of daily weight and advised patient to weigh and record daily Reviewed role of diuretics in prevention of fluid overload and management of heart failure; Discussed the importance of keeping all appointments with provider Provided patient with education about the role of exercise in the management of heart failure Elevate legs while sitting Participate in HHPT and SN visits through [facility]  Patient Self Care Activities:  Attend all scheduled provider appointments Attend church or other social activities Call pharmacy for medication refills 3-7 days in advance of running out of medications Notify RN Care Manager of TOC call rescheduling needs Participate in Transition of Care Program/Attend TOC scheduled calls Perform all self care activities independently  Take medications as prescribed    Plan:  Telephone follow up appointment with care management team member scheduled for:  Tuesday December 2nd at 2:00pm        The patient verbalized understanding of instructions, educational materials, and care plan provided today and agreed to receive a mailed copy of patient instructions, educational materials, and care plan.   The  patient has been provided with contact information for the care management team and has been advised to call with any health related questions or concerns.   Please call the care guide team at [phone] if you need to cancel or reschedule your appointment.   Please call the Suicide and Crisis Lifeline: 988 call the [place]  National Suicide Prevention Lifeline: 409-016-8969 or TTY: 732-442-9180 TTY 443-277-2616) to talk to a trained counselor if you are experiencing a Mental Health or Behavioral Health Crisis or need someone to talk to.  Medford Balboa, BSN, RN [place]  VBCI - [employer] [facility] RN Care Manager [phone]

## 2024-11-22 DIAGNOSIS — Z90710 Acquired absence of both cervix and uterus: Secondary | ICD-10-CM | POA: Diagnosis not present

## 2024-11-22 DIAGNOSIS — E785 Hyperlipidemia, unspecified: Secondary | ICD-10-CM | POA: Diagnosis not present

## 2024-11-22 DIAGNOSIS — I130 Hypertensive heart and chronic kidney disease with heart failure and stage 1 through stage 4 chronic kidney disease, or unspecified chronic kidney disease: Secondary | ICD-10-CM | POA: Diagnosis not present

## 2024-11-22 DIAGNOSIS — Z9851 Tubal ligation status: Secondary | ICD-10-CM | POA: Diagnosis not present

## 2024-11-22 DIAGNOSIS — Z7984 Long term (current) use of oral hypoglycemic drugs: Secondary | ICD-10-CM | POA: Diagnosis not present

## 2024-11-22 DIAGNOSIS — Z96653 Presence of artificial knee joint, bilateral: Secondary | ICD-10-CM | POA: Diagnosis not present

## 2024-11-22 DIAGNOSIS — N1832 Chronic kidney disease, stage 3b: Secondary | ICD-10-CM | POA: Diagnosis not present

## 2024-11-22 DIAGNOSIS — Z7901 Long term (current) use of anticoagulants: Secondary | ICD-10-CM | POA: Diagnosis not present

## 2024-11-22 DIAGNOSIS — E1122 Type 2 diabetes mellitus with diabetic chronic kidney disease: Secondary | ICD-10-CM | POA: Diagnosis not present

## 2024-11-22 DIAGNOSIS — I5033 Acute on chronic diastolic (congestive) heart failure: Secondary | ICD-10-CM | POA: Diagnosis not present

## 2024-11-22 DIAGNOSIS — Z556 Problems related to health literacy: Secondary | ICD-10-CM | POA: Diagnosis not present

## 2024-11-22 DIAGNOSIS — K7460 Unspecified cirrhosis of liver: Secondary | ICD-10-CM | POA: Diagnosis not present

## 2024-11-22 DIAGNOSIS — R32 Unspecified urinary incontinence: Secondary | ICD-10-CM | POA: Diagnosis not present

## 2024-11-22 DIAGNOSIS — I480 Paroxysmal atrial fibrillation: Secondary | ICD-10-CM | POA: Diagnosis not present

## 2024-11-25 DIAGNOSIS — E785 Hyperlipidemia, unspecified: Secondary | ICD-10-CM | POA: Diagnosis not present

## 2024-11-25 DIAGNOSIS — I480 Paroxysmal atrial fibrillation: Secondary | ICD-10-CM | POA: Diagnosis not present

## 2024-11-25 DIAGNOSIS — Z96653 Presence of artificial knee joint, bilateral: Secondary | ICD-10-CM | POA: Diagnosis not present

## 2024-11-25 DIAGNOSIS — Z90710 Acquired absence of both cervix and uterus: Secondary | ICD-10-CM | POA: Diagnosis not present

## 2024-11-25 DIAGNOSIS — Z556 Problems related to health literacy: Secondary | ICD-10-CM | POA: Diagnosis not present

## 2024-11-25 DIAGNOSIS — E1122 Type 2 diabetes mellitus with diabetic chronic kidney disease: Secondary | ICD-10-CM | POA: Diagnosis not present

## 2024-11-25 DIAGNOSIS — K7460 Unspecified cirrhosis of liver: Secondary | ICD-10-CM | POA: Diagnosis not present

## 2024-11-25 DIAGNOSIS — Z7984 Long term (current) use of oral hypoglycemic drugs: Secondary | ICD-10-CM | POA: Diagnosis not present

## 2024-11-25 DIAGNOSIS — I130 Hypertensive heart and chronic kidney disease with heart failure and stage 1 through stage 4 chronic kidney disease, or unspecified chronic kidney disease: Secondary | ICD-10-CM | POA: Diagnosis not present

## 2024-11-25 DIAGNOSIS — R32 Unspecified urinary incontinence: Secondary | ICD-10-CM | POA: Diagnosis not present

## 2024-11-25 DIAGNOSIS — Z7901 Long term (current) use of anticoagulants: Secondary | ICD-10-CM | POA: Diagnosis not present

## 2024-11-25 DIAGNOSIS — N1832 Chronic kidney disease, stage 3b: Secondary | ICD-10-CM | POA: Diagnosis not present

## 2024-11-25 DIAGNOSIS — I5033 Acute on chronic diastolic (congestive) heart failure: Secondary | ICD-10-CM | POA: Diagnosis not present

## 2024-11-25 DIAGNOSIS — Z9851 Tubal ligation status: Secondary | ICD-10-CM | POA: Diagnosis not present

## 2024-11-26 ENCOUNTER — Other Ambulatory Visit: Payer: Self-pay

## 2024-11-26 NOTE — Patient Instructions (Signed)
 Visit Information  Thank you for taking time to visit with me today. Please don't hesitate to contact me if I can be of assistance to you before our next scheduled telephone appointment.  Our next appointment is by telephone on Tuesday December 9th at 2:00pm  Following is a copy of your care plan:   Goals Addressed             This Visit's Progress    VBCI Transitions of Care (TOC) Care Plan       Problems: (reviewed 11/26/24) Recent Hospitalization for treatment of CHF No Hospital Follow Up Provider appointment The patient has not scheduled a hospital follow up appointment yet 11/26/24 - Reminded the patient to schedule a follow up PCP appointment  Goal: (reviewed 11/26/24) Over the next 30 days, the patient will not experience hospital readmission  Interventions: (reviewed 11/26/24)  Heart Failure Interventions: Provided education on low sodium diet Discussed importance of daily weight and advised patient to weigh and record daily Reviewed role of diuretics in prevention of fluid overload and management of heart failure; Discussed the importance of keeping all appointments with provider Provided patient with education about the role of exercise in the management of heart failure Elevate legs while sitting Participate in HHPT and SN visits through [facility]  Patient Self Care Activities: (reviewed 11/26/24) Attend all scheduled provider appointments Attend church or other social activities Call pharmacy for medication refills 3-7 days in advance of running out of medications Notify RN Care Manager of TOC call rescheduling needs Participate in Transition of Care Program/Attend TOC scheduled calls Perform all self care activities independently  Take medications as prescribed    Plan:  Telephone follow up appointment with care management team member scheduled for:  Tuesday December 9th at 2:00pm        The patient verbalized understanding of instructions, educational  materials, and care plan provided today and agreed to receive a mailed copy of patient instructions, educational materials, and care plan.   The patient has been provided with contact information for the care management team and has been advised to call with any health related questions or concerns.   Please call the care guide team at [phone] if you need to cancel or reschedule your appointment.   Please call the Suicide and Crisis Lifeline: 988 call the [place]  National Suicide Prevention Lifeline: 3605709650 or TTY: 9366737328 TTY 612-168-3312) to talk to a trained counselor if you are experiencing a Mental Health or Behavioral Health Crisis or need someone to talk to.  Medford Balboa, BSN, RN [place]  VBCI - [employer] [facility] RN Care Manager [phone]

## 2024-11-26 NOTE — Transitions of Care (Post Inpatient/ED Visit) (Signed)
 Transition of Care week 2  Visit Note  11/26/2024  Name: Stacey Cooper MRN: [id number]          DOB: 14-Apr-1935  Situation: Patient enrolled in [facility] 30-day program. Visit completed with Leoni Arismendez by telephone.   Background:   Initial Transition Care Management Follow-up Telephone Call Discharge Date and Diagnosis: 11/17/24, Hypoxia   Past Medical History:  Diagnosis Date   Actinic keratoses 07/18/2011   Alopecia 01/24/2011   Qualifier: Diagnosis of  By: [name] MD, Stacey     Anterior dislocation of right shoulder    Arthralgia 05/03/2015   Back pain 12/08/2011   Basal cell carcinoma of skin of lip 01/24/2011   Blood transfusion without reported diagnosis    Cataract    bilataeral cateracts removed   CHOLELITHIASIS, HX OF 02/07/2011   Chronic kidney disease    Diabetes mellitus type 2 in obese 01/24/2011   Qualifier: Diagnosis of  By: [name] MD, [name]  Annual eye exam with [facility] on 06/14/13    Essential hypertension, benign 01/24/2011   Fatty liver    GERD 01/24/2011   Grief reaction 01/20/2014   HAIR LOSS 01/24/2011   Hearing loss of both ears 06/28/2013   Hepatic cirrhosis (HCC) 04/27/2015   Hyperlipidemia, mixed 04/24/2012   Insomnia 07/24/2012   Joint pain    MEASLES, HX OF 01/24/2011   Morbid obesity (HCC) 01/24/2011   NONSPEC ELEVATION OF LEVELS OF TRANSAMINASE/LDH 02/07/2011   Peripheral neuropathy 04/20/2011   Right hip pain 05/03/2015   SCC (squamous cell carcinoma), face 07/18/2011   SQUAMOUS CELL CARCINOMA OTHER SPEC SITES SKIN 01/24/2011   Swelling    Tubular adenoma of colon 04/2015   Umbilical hernia 08/23/2016   URINARY INCONTINENCE 02/07/2011   UTI'S, HX OF 01/24/2011   Vulvar atrophy 03/17/2017    Assessment: Patient Reported Symptoms: Cognitive Cognitive Status: Alert and oriented to person, place, and time, Normal speech and language skills, Struggling with memory recall      Neurological Neurological Review of Symptoms: No symptoms reported     HEENT HEENT Symptoms Reported: No symptoms reported      Cardiovascular Cardiovascular Symptoms Reported: No symptoms reported Does patient have uncontrolled Hypertension?: No Cardiovascular Management Strategies: Medication therapy, Routine screening, Activity, Coping strategies  Respiratory Respiratory Symptoms Reported: No symptoms reported Respiratory Management Strategies: Routine screening, Adequate rest, Activity  Endocrine Endocrine Symptoms Reported: No symptoms reported Is patient diabetic?: Yes Is patient checking blood sugars at home?: Yes List most recent blood sugar readings, include date and time of day: 174 am Endocrine Comment: the patient checked her blood sugar today  Gastrointestinal Gastrointestinal Symptoms Reported: No symptoms reported      Genitourinary Genitourinary Symptoms Reported: Incontinence, Frequency, Urgency Genitourinary Management Strategies: Incontinence garment/pad  Integumentary Integumentary Symptoms Reported: No symptoms reported    Musculoskeletal Musculoskelatal Symptoms Reviewed: Unsteady gait, Limited mobility, Difficulty walking Musculoskeletal Management Strategies: Activity, Routine screening Musculoskeletal Comment: The patient has HHPT      Psychosocial Psychosocial Symptoms Reported: No symptoms reported         There were no vitals filed for this visit.    Medications Reviewed Today     Reviewed by Moises Reusing, RN (Case Manager) on 11/26/24 at 1410  Med List Status: <None>   Medication Order Taking? Sig Documenting Provider Last Dose Status Informant  apixaban (ELIQUIS) 5 MG TABS tablet [id number]  Take 5 mg by mouth 2 (two) times daily. [provider]  Active   Blood Glucose Monitoring  Suppl (PRODIGY AUTOCODE BLOOD GLUCOSE) w/Device KIT [id number] No 1 Device by Does not apply route 3 (three) times daily between meals as needed. [name] [name] LABOR, MD Taking Active   clotrimazole  (LOTRIMIN ) 1 % cream [id number]   Apply 1 Application topically 2 (two) times daily. [provider]  Active   clotrimazole -betamethasone  (LOTRISONE ) cream [id number]  Apply 1 Application topically 2 (two) times daily as needed. [provider]  Active   diltiazem (CARDIZEM SR) 60 MG 12 hr capsule [id number]  Take 60 mg by mouth 2 (two) times daily. [provider]  Active   empagliflozin (JARDIANCE) 10 MG TABS tablet [id number]  Take 10 mg by mouth daily. [provider]  Active   fluorouracil (EFUDEX) 5 % cream [id number]  Apply topically daily. Use for two weeks  Patient not taking: Reported on 11/19/2024   [provider]  Active   furosemide  (LASIX ) 20 MG tablet [id number]  Take 0.5 tablets (10 mg total) by mouth daily as needed.  Patient not taking: Reported on 11/19/2024   [name], DO  Active   glimepiride  (AMARYL ) 2 MG tablet [id number] No Take 1 tablet (2 mg total) by mouth daily before breakfast. Catherine, [name], DO Taking Active   Lancets MISC [id number] No Check blood sugars qam and once daily prn [name] [name] LABOR, MD Taking Active   lisinopril  (ZESTRIL ) 5 MG tablet [id number]  Take 1 tablet (5 mg total) by mouth daily.  Patient not taking: Reported on 11/19/2024   Kuneff, [name], DO  Active   metformin  (FORTAMET ) 500 MG (OSM) 24 hr tablet [id number]  Take 500 mg by mouth daily with breakfast. [provider]  Active   metFORMIN  (GLUCOPHAGE ) 1000 MG tablet [id number]  TAKE 1 TABLET (1,000 MG TOTAL) BY MOUTH 2 (TWO) TIMES DAILY WITH A MEAL. DC PRIOR SCRIPT PLEASE.  Patient not taking: Reported on 11/19/2024   Kuneff, [name], DO  Active   metolazone (ZAROXOLYN) 2.5 MG tablet [id number]  Take 2.5 mg by mouth once a week. [provider]  Active   [facility] ULTRA test strip [id number] No USE TO TEST BLOOD SUGAR TWICE A DAY AS DIRECTED [name], DO Taking Active   rosuvastatin  (CRESTOR ) 5 MG tablet [id number] No Take 1 tablet (5 mg total) by mouth  every other day. Kuneff, [name], DO Taking Active   spironolactone (ALDACTONE) 50 MG tablet [id number]  Take 50 mg by mouth daily. [provider]  Active   torsemide (DEMADEX) 20 MG tablet [id number]  Take 40 mg by mouth 2 (two) times daily. [provider]  Active             Recommendation:   Continue Current Plan of Care  Follow Up Plan:   Telephone follow-up in 1 week  Medford Balboa, BSN, RN [place]  VBCI - [facility] RN Care Manager [phone]

## 2024-12-03 ENCOUNTER — Other Ambulatory Visit: Payer: Self-pay

## 2024-12-06 ENCOUNTER — Other Ambulatory Visit

## 2024-12-06 NOTE — Transitions of Care (Post Inpatient/ED Visit) (Signed)
 Transition of Care week 3  Visit Note  12/06/2024  Name: Stacey Cooper MRN: [id number]          DOB: 09-13-1935  Situation: Patient enrolled in [facility] 30-day program. Visit completed with [name] by telephone.   Background:   Initial Transition Care Management Follow-up Telephone Call Discharge Date and Diagnosis: 11/17/24, Hypoxia   Past Medical History:  Diagnosis Date   Actinic keratoses 07/18/2011   Alopecia 01/24/2011   Qualifier: Diagnosis of  By: [name] MD, Stacey     Anterior dislocation of right shoulder    Arthralgia 05/03/2015   Back pain 12/08/2011   Basal cell carcinoma of skin of lip 01/24/2011   Blood transfusion without reported diagnosis    Cataract    bilataeral cateracts removed   CHOLELITHIASIS, HX OF 02/07/2011   Chronic kidney disease    Diabetes mellitus type 2 in obese 01/24/2011   Qualifier: Diagnosis of  By: [name] MD, [name]  Annual eye exam with [facility] on 06/14/13    Essential hypertension, benign 01/24/2011   Fatty liver    GERD 01/24/2011   Grief reaction 01/20/2014   HAIR LOSS 01/24/2011   Hearing loss of both ears 06/28/2013   Hepatic cirrhosis (HCC) 04/27/2015   Hyperlipidemia, mixed 04/24/2012   Insomnia 07/24/2012   Joint pain    MEASLES, HX OF 01/24/2011   Morbid obesity (HCC) 01/24/2011   NONSPEC ELEVATION OF LEVELS OF TRANSAMINASE/LDH 02/07/2011   Peripheral neuropathy 04/20/2011   Right hip pain 05/03/2015   SCC (squamous cell carcinoma), face 07/18/2011   SQUAMOUS CELL CARCINOMA OTHER SPEC SITES SKIN 01/24/2011   Swelling    Tubular adenoma of colon 04/2015   Umbilical hernia 08/23/2016   URINARY INCONTINENCE 02/07/2011   UTI'S, HX OF 01/24/2011   Vulvar atrophy 03/17/2017    Assessment: Patient Reported Symptoms: Cognitive Cognitive Status: Alert and oriented to person, place, and time, Normal speech and language skills, Struggling with memory recall      Neurological Neurological Review of Symptoms: No symptoms reported     HEENT HEENT Symptoms Reported: No symptoms reported      Cardiovascular Cardiovascular Symptoms Reported: Swelling in legs or feet Does patient have uncontrolled Hypertension?: No Cardiovascular Management Strategies: Medication therapy, Routine screening, Fluid modification, Adequate rest Cardiovascular Comment: The patient takes two medications for her BLE edema. She states when she sits down and elevates her legs, the swelling is better. She does not have SOB  Respiratory Respiratory Symptoms Reported: No symptoms reported    Endocrine Endocrine Symptoms Reported: No symptoms reported Is patient checking blood sugars at home?: Yes List most recent blood sugar readings, include date and time of day: 139 am    Gastrointestinal Gastrointestinal Symptoms Reported: No symptoms reported      Genitourinary Genitourinary Symptoms Reported: Incontinence, Frequency, Urgency Genitourinary Management Strategies: Incontinence garment/pad, Coping strategies  Integumentary Integumentary Symptoms Reported: No symptoms reported    Musculoskeletal Musculoskelatal Symptoms Reviewed: Unsteady gait, Limited mobility, Difficulty walking Musculoskeletal Management Strategies: Activity, Routine screening Musculoskeletal Comment: HHPT      Psychosocial Psychosocial Symptoms Reported: No symptoms reported         There were no vitals filed for this visit.    Medications Reviewed Today     Reviewed by Moises Reusing, RN (Case Manager) on 12/06/24 at 1451  Med List Status: <None>   Medication Order Taking? Sig Documenting Provider Last Dose Status Informant  apixaban (ELIQUIS) 5 MG TABS tablet [id number]  Take 5 mg by  mouth 2 (two) times daily. [provider]  Active   Blood Glucose Monitoring Suppl (PRODIGY AUTOCODE BLOOD GLUCOSE) w/Device KIT [id number] No 1 Device by Does not apply route 3 (three) times daily between meals as needed. [name] [name] LABOR, MD Taking Active   clotrimazole   (LOTRIMIN ) 1 % cream [id number]  Apply 1 Application topically 2 (two) times daily. [provider]  Active   clotrimazole -betamethasone  (LOTRISONE ) cream [id number]  Apply 1 Application topically 2 (two) times daily as needed. [provider]  Active   diltiazem (CARDIZEM SR) 60 MG 12 hr capsule [id number]  Take 60 mg by mouth 2 (two) times daily. [provider]  Active   empagliflozin (JARDIANCE) 10 MG TABS tablet [id number]  Take 10 mg by mouth daily. [provider]  Active   fluorouracil (EFUDEX) 5 % cream [id number]  Apply topically daily. Use for two weeks  Patient not taking: Reported on 11/19/2024   [provider]  Active   furosemide  (LASIX ) 20 MG tablet [id number]  Take 0.5 tablets (10 mg total) by mouth daily as needed.  Patient not taking: Reported on 11/19/2024   [name], DO  Active   glimepiride  (AMARYL ) 2 MG tablet [id number] No Take 1 tablet (2 mg total) by mouth daily before breakfast. Catherine, [name], DO Taking Active   Lancets MISC [id number] No Check blood sugars qam and once daily prn [name] [name] LABOR, MD Taking Active   lisinopril  (ZESTRIL ) 5 MG tablet [id number]  Take 1 tablet (5 mg total) by mouth daily.  Patient not taking: Reported on 11/19/2024   Kuneff, [name], DO  Active   metformin  (FORTAMET ) 500 MG (OSM) 24 hr tablet [id number]  Take 500 mg by mouth daily with breakfast. [provider]  Active   metFORMIN  (GLUCOPHAGE ) 1000 MG tablet [id number]  TAKE 1 TABLET (1,000 MG TOTAL) BY MOUTH 2 (TWO) TIMES DAILY WITH A MEAL. DC PRIOR SCRIPT PLEASE.  Patient not taking: Reported on 11/19/2024   Kuneff, [name], DO  Active   metolazone (ZAROXOLYN) 2.5 MG tablet [id number]  Take 2.5 mg by mouth once a week. [provider]  Active   [facility] ULTRA test strip [id number] No USE TO TEST BLOOD SUGAR TWICE A DAY AS DIRECTED [name], DO Taking Active   rosuvastatin  (CRESTOR ) 5 MG tablet [id number] No Take 1  tablet (5 mg total) by mouth every other day. Kuneff, [name], DO Taking Active   spironolactone (ALDACTONE) 50 MG tablet [id number]  Take 50 mg by mouth daily. [provider]  Active   torsemide (DEMADEX) 20 MG tablet [id number]  Take 40 mg by mouth 2 (two) times daily. [provider]  Active             Recommendation:   Continue Current Plan of Care  Follow Up Plan:   Telephone follow-up in 1 week  Medford Balboa, BSN, RN [place]  VBCI - [facility] RN Care Manager [phone]

## 2024-12-06 NOTE — Patient Instructions (Signed)
 Visit Information  Thank you for taking time to visit with me today. Please don't hesitate to contact me if I can be of assistance to you before our next scheduled telephone appointment.  Our next appointment is by telephone on Thursday December 18th at 2:00pm   Following is a copy of your care plan:   Goals Addressed             This Visit's Progress    VBCI Transitions of Care (TOC) Care Plan       Problems: (reviewed 12/06/24) Recent Hospitalization for treatment of CHF No Hospital Follow Up Provider appointment The patient has not scheduled a hospital follow up appointment yet 11/26/24 - Reminded the patient to schedule a follow up PCP appointment  Goal: (reviewed 12/06/24) Over the next 30 days, the patient will not experience hospital readmission  Interventions: (reviewed 12/06/24)  Heart Failure Interventions: Provided education on low sodium diet Discussed importance of daily weight and advised patient to weigh and record daily Reviewed role of diuretics in prevention of fluid overload and management of heart failure; Discussed the importance of keeping all appointments with provider Provided patient with education about the role of exercise in the management of heart failure Elevate legs while sitting Participate in HHPT and SN visits through [facility]  Patient Self Care Activities: (reviewed 12/06/24) Attend all scheduled provider appointments Attend church or other social activities Call pharmacy for medication refills 3-7 days in advance of running out of medications Notify RN Care Manager of TOC call rescheduling needs Participate in Transition of Care Program/Attend [facility] scheduled calls Perform all self care activities independently  Take medications as prescribed    Plan:  Telephone follow up appointment with care management team member scheduled for:  Thursday December 18th at 2:00pm        The patient verbalized understanding of instructions,  educational materials, and care plan provided today and agreed to receive a mailed copy of patient instructions, educational materials, and care plan.   Telephone follow up appointment with care management team member scheduled for: Face to Face appointment with care management team member scheduled for:   Please call the care guide team at [phone] if you need to cancel or reschedule your appointment.   Please call the Suicide and Crisis Lifeline: 988 call the [place]  National Suicide Prevention Lifeline: 909-596-4398 or TTY: 2067722323 TTY (706) 386-0417) to talk to a trained counselor if you are experiencing a Mental Health or Behavioral Health Crisis or need someone to talk to.  Medford Balboa, BSN, RN [place]  VBCI - [employer] [facility] RN Care Manager [phone]

## 2024-12-10 ENCOUNTER — Other Ambulatory Visit: Payer: Self-pay

## 2024-12-10 ENCOUNTER — Telehealth: Payer: Self-pay

## 2024-12-10 DIAGNOSIS — K7460 Unspecified cirrhosis of liver: Secondary | ICD-10-CM

## 2024-12-10 NOTE — Telephone Encounter (Signed)
 Reminder was received in Epic.  Pt was scheduled for RUQ US  on 12/16/2024 at [facility]. Pt to arrive at 7:45 AM. NPO past midnight. Left message for pt to call back

## 2024-12-10 NOTE — Telephone Encounter (Signed)
-----   Message from Nurse Cristino NOVAK, RN sent at 12/10/2024  8:02 AM EST ----- Regarding: FW: 84-month RUQ US   ----- Message ----- From: [place] Cristino SAILOR, RN Sent: 12/10/2024  12:00 AM EST To: Cristino SAILOR Mercer, RN Subject: 17-month RUQ US                                   right upper quadrant ultrasound - HCC surveillance/cirrhosis - need to order

## 2024-12-11 NOTE — ED Provider Notes (Addendum)
 ------------------------------------------------------------------------------- Attestation signed by Stacey JONETTA Hopes, MD at 12/12/24 [phone] I have seen and evaluated this patient with [name], PA.  I agree with the assessment and plan as documented.  Patient woke up this morning and was confused.  This seems to have resolved at this time.  Reviewing her Cooper notes from when she was admitted about 3 weeks ago she has these intermittent confusion episodes in the morning and it is worrisome that she may have sleep apnea or some nocturnal hypoxia causing this.  The Cooper arrange for a sleep study that cannot be performed.  I did discuss with the family the possibility of an in-home sleep study if they were to follow-up with her primary care provider which they have not done from the hospitalization.  Ultimately, the patient seems much improved, alert and oriented x 3 but globally weak.  They have home health resources.  Her great-niece, which essentially functions as her daughter, lives right next door.  The patient has clear lungs, regular rhythm, benign abdomen, nonfocal neuroexam, is afebrile with stable vital signs.  Her medical workup was unremarkable.  We extended her workup for these intermittent confusion episodes to include a lot of things that are generally checked as an outpatient but had not been done so we did them here.  We did an MRI that showed no signs of stroke.  I discussed with the family that the patient does not have any strict admission criteria and as such but the goal of care was.  Daughter would like the patient to be observed to ensure this does not happen again tomorrow morning so we will see about admitting for observation status for altered mental state that has resolved. -------------------------------------------------------------------------------   Stacey Cooper  ED Provider Note  Stacey Cooper 89 y.o. female DOB: 1935/12/22 MRN:  [id number] History   Chief Complaint  Patient presents with   Altered Mental Status    BIB ems from home for confusion x 2 days, last seen normal yesterday morning, found in bed by family soaked in urine this am , EMS also reports multiple falls recently    89 year old female reports to ED for having altered mental status.  When asked the patient how she is feeling she states she has felt funny for 4 to 5 days with weakness and it feels like something is in throat.  The patient's niece and great niece are at bedside and provide additional history.  Stacey Cooper who is the healthcare power of attorney and great niece provides additional history.  Stacey Cooper states that she went to check on the patient yesterday at 6:30 PM and reported that the patient felt more weak than normal however mentation was baseline alert oriented.  Stacey Cooper states that when she went to check on the patient at approximately 810 this morning noted that patient was confused and still lying in her bed soiled with urine.  Stacey Cooper states that she was able to get the patient out of her bed and noted that the patient appeared more confused and delirious trying to run away like Stacey Cooper saying that she was going home.  Family denies any chances of falls or trauma as the patient has a life alert and if she she had fallen she would not be able to get herself up as she does not ambulate well on her own.  Patient has a past medical history of congestive heart failure and takes furosemide  as directed.  Patient has a past medical history  of A-fib and takes Cardizem and Eliquis twice a day as directed.  Family states that the patient has not taken her medications since last night as they noted the pill still on the counter.  Patient denies any fevers chills, shortness of breath, chest pain, dysuria.    Altered Mental Status Presenting symptoms: confusion   Associated symptoms: weakness   Associated symptoms: no fever, no headaches, no  light-headedness, no nausea and no vomiting        Past Medical History:  Diagnosis Date   CHF (congestive heart failure) (*)    Cirrhosis, non-alcoholic (*)    Diabetes (*)    AVG FBS 100   History of transfusion    HTN (hypertension)    Hyperlipidemia     Past Surgical History:  Procedure Laterality Date   Cataract extraction, bilateral Bilateral    Colonoscopy  05/11/2015   Colonoscopy  12/26/2006   Hysterectomy     Mammography     Total knee arthroplasty Bilateral    KNEES   Tubal ligation      Social History   Substance and Sexual Activity  Alcohol Use Never   Tobacco Use History[1] E-Cigarettes   Vaping Use Never User    Start Date     Cartridges/Day     Quit Date     Social History   Substance and Sexual Activity  Drug Use Never         Allergies[2]  Home Medications   APIXABAN (ELIQUIS) 5 MG TABLET    Take one tablet (5 mg dose) by mouth 2 (two) times daily.   BLOOD GLUCOSE MONITORING SUPPL (ONETOUCH VERIO REFLECT) W/DEVICE KIT    USE DAILY AS INSTRUCTED   CLOTRIMAZOLE  (LOTRIMIN ) 1% CREAM    Apply to affected area 2 times daily   CLOTRIMAZOLE -BETAMETHASONE  (LOTRISONE ) CREAM    Apply one Application topically 2 (two) times a day as needed.   DILTIAZEM HCL (CARDIZEM ER) 60 MG 12 HR CAPSULE    Take one capsule (60 mg dose) by mouth 2 (two) times daily.   EMPAGLIFLOZIN (JARDIANCE) 10 MG TABS TABLET    Take one tablet (10 mg dose) by mouth daily.   FLUOROURACIL (EFUDEX) 5 % CREAM    Apply topically.   GLIMEPIRIDE  (AMARYL ) 2 MG TABLET    Take one tablet (2 mg dose) by mouth daily.   INSULIN  ASPART (NOVOLOG FLEXPEN) 100 UNIT/ML INJECTION    If Blood sugars 201-250   3 units [place]                          251-300    6 units [place]                          301-350    8 units [place]                          351- 400   10 units [place]       Greater than 400          12 units  and call MD   INSULIN  PEN NEEDLE (BD,SURE COMFORT,NOVOFINE) 32G X 4 MM MISC     Use as directed   METFORMIN  ER (GLUCOPHAGE -XR) 500 MG 24 HR TABLET    Take one tablet (500 mg dose) by mouth every morning.   METOLAZONE (ZAROXOLYN) 2.5 MG TABLET    Take one  tablet (2.5 mg dose) by mouth once a week.   SPIRONOLACTONE (ALDACTONE) 50 MG TABLET    Take one tablet (50 mg dose) by mouth daily.   TORSEMIDE (DEMADEX) 20 MG TABLET    Take two tablets (40 mg dose) by mouth daily.    Primary Survey  Primary Survey  Review of Systems   Review of Systems  Constitutional:  Negative for chills and fever.  HENT:  Positive for trouble swallowing.   Respiratory:  Negative for cough, chest tightness and shortness of breath.   Cardiovascular:  Negative for chest pain.  Gastrointestinal:  Positive for abdominal distention. Negative for blood in stool, constipation, diarrhea, nausea and vomiting.  Genitourinary:  Negative for difficulty urinating, dysuria, flank pain, hematuria and urgency.  Neurological:  Positive for weakness. Negative for facial asymmetry, speech difficulty, light-headedness, numbness and headaches.  Psychiatric/Behavioral:  Positive for confusion.     Physical Exam   ED Triage Vitals [12/11/24 1032]  BP 150/65  Heart Rate 86  Resp 18  SpO2 95 %  Temp 99.6 F (37.6 C)    Physical Exam  Nursing note and vitals reviewed. Constitutional: She does not appear distressed and no respiratory distress.  HENT:  Head: Normocephalic and atraumatic.  Mouth/Throat: Voice normal.  Eyes: EOM are intact. Pupils are equal, round, and reactive to light.  Neck: Voice normal.  Cardiovascular: Normal heart sounds and intact distal pulses. Irregularly irregular rhythm.  Pulmonary/Chest: No respiratory distress. Good air movement. Respiratory effort normal and breath sounds normal. No chest wall tenderness.  Abdominal: There is mild abdominal tenderness. Abdomen appears distended. There is no CVA tenderness.  Neurological: She is alert and oriented to person, place, and time.  She has normal speech.  Skin: Skin is warm. Skin is dry.  Psychiatric: She has a normal mood and affect.     ED Course   Lab results:   URINALYSIS W/MICRO REFLEX CULTURE - SYMPTOMATIC - Abnormal      Result Value   Urine Color Yellow     Urine Clarity Clear     Urine Specific Gravity 1.014     Urine pH 7.0     Urine Protein - Dipstick Negative     Urine Glucose 200 (*)    Urine Ketones Negative     Urine Bilirubin Negative     Urine Blood Negative     Urine Nitrite Negative     Urine Urobilinogen 2 (*)    Urine Leukocyte Esterase Negative     UA Microscopic No Micro     Narrative:    Does not meet criteria for reflex to Urine Culture.  CBC AND DIFFERENTIAL - Abnormal   WBC 7.5     RBC 4.54     HGB 14.1     HCT 41.1     MCV 90.5     MCH 31.1     MCHC 34.3     Plt Ct 199     RDW SD 49.4 (*)    MPV 10.2     NRBC% 0.0     Absolute NRBC Count 0.00     NEUTROPHIL % 76.4     LYMPHOCYTE % 9.1     MONOCYTE % 12.3     Eosinophil % 1.5     BASOPHIL % 0.4     IG% 0.3     ABSOLUTE NEUTROPHIL COUNT 5.70     ABSOLUTE LYMPHOCYTE COUNT 0.68 (*)    Absolute Monocyte Count 0.92 (*)  Absolute Eosinophil Count 0.11     Absolute Basophil Count 0.03     Absolute Immature Granulocyte Count 0.02    COMPREHENSIVE METABOLIC PANEL - Abnormal   Na 136     Potassium 3.5 (*)    Cl 92 (*)    CO2 28     AGAP 16     Glucose 159 (*)    BUN 26     Creatinine 1.52 (*)    Ca 10.6 (*)    ALK PHOS 167 (*)    T Bili 1.4 (*)    Total Protein 7.3     Alb 3.4 (*)    GLOBULIN 3.9     ALBUMIN/GLOBULIN RATIO 0.9 (*)    BUN/CREAT RATIO 17.1     ALT 15     AST 29     eGFR 33 (*)    Comment: Normal GFR (glomerular filtration rate) > 60 mL/min/1.73 meters squared, < 60 may include impaired kidney function. Calculation based on the Chronic Kidney Disease Epidemiology Collaboration (CK-EPI)equation refit without adjustment for race.  GEN5 CARDIAC TROPONIN T (TNT5) BASELINE - Abnormal    TnT-Gen5 (0hr) 63 (*)    Comment: An elevated Troponin indicates myocardial damage. Elevated troponin may also be due to pulmonary emboli, aortic dissection, heart failure, trauma, toxins and ischemia in the setting of critical illness.   GEN5 CARDIAC TROPONIN T(TNT5) 1 HOUR - Abnormal   TnT-Gen5 (1hr) 56 (*)    Comment: An elevated Troponin indicates myocardial damage. Elevated troponin may also be due to pulmonary emboli, aortic dissection, heart failure, trauma, toxins and ischemia in the setting of critical illness.   Delta 1 Hour -7    GEN5 CARDIAC TROPONIN T(TNT5) 3 HOUR - Abnormal   TnT-Gen5 (3hr) 59 (*)    Comment: An elevated Troponin indicates myocardial damage. Elevated troponin may also be due to pulmonary emboli, aortic dissection, heart failure, trauma, toxins and ischemia in the setting of critical illness.   Delta 3 Hour -4    AMMONIA - Abnormal   Ammonia 60 (*)   TSH - Abnormal   TSH 4.59 (*)   POCT GLUCOSE - Abnormal   Glucose, POC 192 (*)    OPERATOR ID [id number]     INSTRUMENT ID [id number]    COVID-19, FLU A+B AND RSV - Normal   Flu A Negative     Flu B Negative     RSV PCR Negative     SARS-COV-2 Not Detected     Narrative:    SARS-COV-2 (COVID-19)PCR-Negative results do not preclude SARS-CoV-2 infection and should not be used as the sole basis for patient management decisions. Negative results must be combined with clinical observations, patient history, and epidemiological information.  Flu and/or RSV - Negative results do not preclude the presence of Flu or RSV virus and should not be used as the sole basis for treatment or other patient management decisions. False negative results may occur if virus is present at levels below the analytical limit of detection.  This test detects Influenza A, Influenza B, and Respiratory Syncytial Virus and SARS-COV-2 (COVID-19) by PCR.     NT-PROBNP - Normal   NT-ProBNP 462     Comment: Among patients presenting with  dyspnea, NT-proBNP is highly sensitive for the detection of acute decompensated heart failure (ADHF). In addition, a NT-proBNP when used with the recommended age-independent exclusionary cut-point of <300 pg/mL effectively rules out ADHF with a negative predictive value (NPV) ranging from 97.7% for males to  98.5% for females.  VITAMIN B12 AND FOLATE  SYPHILIS: RPR WITH REFLEX TO RPR TITER AND TREPONEMAL AB  VITAMIN B1, WHOLE BLOOD  POCT GLUCOSE  POCT GLUCOSE  POCT GLUCOSE  POCT GLUCOSE  POCT GLUCOSE  POCT GLUCOSE  POCT GLUCOSE  POCT GLUCOSE  POCT GLUCOSE  POCT GLUCOSE  POCT GLUCOSE  LIGHT BLUE TOP  LAVENDER TOP  MINT GREEN-TOP TUBE (PST GEL/LI HEP)  GOLD SST    Imaging:   CT HEAD WO CONTRAST   Narrative:    HISTORY: Confusion  COMPARISON: 11/15/2024  TECHNIQUE: Multiple contiguous axial images of the head are obtained.  CONTRAST: None  FINDINGS:  Brain parenchyma: There is no mass effect or midline shift.  There is no acute intracranial hemorrhage.    There are changes of chronic small vessel ischemic disease.  Ventricles and extra-axial spaces: Within normal limits.  Paranasal sinuses and mastoids: Right sphenoid and mild left frontal sinus mucosal thickening.  Calvarium and skull base: Intact    Impression:    IMPRESSION: No acute intracranial findings.  Electronically Signed by: Lynwood Portugal on 12/11/2024 11:17 AM  XR CHEST AP PORTABLE   Narrative:    INDICATION:  Weakness  COMPARISON: 11/15/2024  TECHNIQUE: Portable chest  FINDINGS: Normal heart size. The lungs and pleural spaces are clear. Osteopenia. Degenerative change of the spine and shoulders.    Impression:    IMPRESSION: No acute abnormality.  Electronically Signed by: Sheppard Charm, MD on 12/11/2024 12:36 PM  MRI HEAD WO CONTRAST   Narrative:    MRI HEAD WO CONTRAST  INDICATION: Other AMS  COMPARISON: None available at time of dictation.  TECHNIQUE: Imaging of the brain was performed  in 3 planes utilizing a combination of T1, FLAIR, and T2 weighting. Diffusion images were performed.   FINDINGS: Patchy periventricular and deep white matter T2/FLAIR hyperintensities are nonspecific but compatible with mild chronic microangiopathic changes. Mild brain parenchymal volume loss. No abnormally restricted diffusion to suggest acute or subacute infarction. No mass or mass effect. No abnormal extra-axial fluid collection.  No hydrocephalus. The larger intracranial vessels demonstrate normal flow voids.  The included paranasal sinuses are predominantly clear. The mastoid air cells are clear. Normal marrow signal pattern.    Impression:    IMPRESSION: 1.  No acute intracranial abnormality.  Electronically Signed by: Valma Companion, MD on 12/11/2024 1:59 PM     ECG: ECG Results          ECG 12 lead (Final result)      Collection Time Result Time Acquisition Device Systolic BP Diastolic BP Ventricular Rate Atrial Rate P-R Interval QRS Duration Q-T Interval QTC Calculation(Bazett) Calculated P Axis   12/11/24 11:57:07 12/11/24 18:52:12 D3K 115 61 77 77 230 76 444 502 51         Collection Time Result Time Calculated R Axis Calculated T Axis ECGDiag   12/11/24 11:57:07 12/11/24 18:52:12 96 46 Sinus rhythm with sinus arrhythmia with 1st degree AV block Rightward axis Low voltage QRS Borderline ECG When compared with ECG of 15-Nov-2024 13:43, No significant change was found Chet Rush ([id number]) on 12/11/2024 6:52:11 PM certifies that he/she has reviewed the ECG tracing and confirms the independent interpretation is correct.               Final result  Pre-Sedation Procedures  ED Course as of 12/11/24 1930  Prentice Fallow [facility] Documentation  Wed Dec 11, 2024  1538 Discussed this case with the hospitalist Dr. Amanda  who agreed to come evaluate the patient for observation.  1538 TSH and ammonia are elevated  1539 Troponins are elevated however with negative delta  1539 On CMP does show elevated alk phos and creatinine when compared to previous labs.   Medical Decision Making This patient reports to the ED for having acute altered mental status with increased weakness over the past 24 hours.    Based on the HPI and physical exam findings my differential diagnosis to include but not be limited to:  Cerebrovascular accident, transient ischemic accident, acute delirium, hepatic encephalopathy, pneumonia, urinary tract infection, other infectious process  EKGs reviewed, see results above  Labs I have reviewed the viral panel for COVID, RSV, and influenza I reviewed the CBC, see results above I reviewed the CMP see results above I Reviewed the urinalysis see results above Fifth generation troponins reviewed, see results I reviewed the proBNP, see results above TSH reviewed, see results above Ammonia reviewed, see results above B12/folate pending B1/thiamine pending Syphilis RPR pending  Imaging I reviewed chest x-ray, see results above I reviewed MRI, see results above I reviewed CT head without contrast, see results above  Medications apixaban (ELIQUIS) tablet 2.5 mg (has no administration in time range) diltiazem HCl (CARDIZEM ER) 12 hr capsule 60 mg (has no administration in time range) empagliflozin (JARDIANCE) tablet 10 mg (has no administration in time range) spironolactone (ALDACTONE) tablet 50 mg (has no administration in time range) torsemide (DEMADEX) tablet 40 mg (has no administration in time range) acetaminophen  (TYLENOL ) tablet 650 mg (has no administration in time range)   Or acetaminophen  (TYLENOL ) suppository 650 mg (has no administration in time range) melatonin tablet 3 mg (has no administration in time range) ondansetron  (ZOFRAN ) tablet 4 mg (has no administration in time  range)   Or ondansetron  (ZOFRAN ) injection 4 mg (has no administration in time range) sennosides-docusate sodium (SENOKOT-S) 8.6-50 mg per tablet 1 tablet (has no administration in time range) guaiFENesin (SM TUSSIN MUCUS,ROBITUSSIN) 100 mg/5 mL liquid 200 mg (has no administration in time range) aluminum & magnesium hydroxide-simethicone (MAALOX,MYLANTA,ANTACID ANTI-GAS) 200-200-20 mg/5 mL oral suspension 30 mL (has no administration in time range) albuterol  sulfate (PROVENTIL ) 2.5 mg/3 mL nebulizer solution 2.5 mg (has no administration in time range) lactulose (LACTULOSE,GENERLAC) 10 g/15 mL solution 20 g (has no administration in time range) potassium chloride (K-DUR,KLOR-CON) CR tablet 40 mEq (has no administration in time range) dextrose (GLUTOSE/SWEET CHEEKS) oral gel 15-30 g of glucose (has no administration in time range)   Or dextrose injection 12.5 g (has no administration in time range)   Or dextrose injection 25 g (has no administration in time range)   Or glucagon injection 1 mg (has no administration in time range) insulin  lispro (HUMALOG,ADMELOG) 100 Units/mL injection 2-8 Units (has no administration in time range) NaCl 0.9% bolus 500 mL (0 mLs IntraVENous Stopped 12/11/24 1501)   I discussed this case with my attending Dr. Norleen Cooper who felt like the patient will be appropriate for observation given her acute altered mental status.  Dr. Hopes recommended talking to the hospitalist in terms of having observation versus admission.  Dr. Amanda was gracious enough to come evaluate the patient for admission.  Dr. Amanda will set up an appropriate treatment plan for the patient and disposition going forward.    Amount and/or  Complexity of Data Reviewed Labs: ordered. Radiology: ordered.  Risk Decision regarding hospitalization.          Provider Communication  New Prescriptions   No medications on file    Modified Medications   No medications on file     Discontinued Medications   No medications on file    Clinical Impression Final diagnoses:  Acute delirium    ED Disposition     ED Disposition  Admit   Condition  --   Comment  --                   Electronically signed by:     Prentice Elsie Kerns, PA-C 12/11/24 1657       [1] Social History Tobacco Use  Smoking Status Never  Smokeless Tobacco Never  [2] Allergies Allergen Reactions   Iodinated Diagnostic Agents Shortness Of Breath   Doxycycline  Other    Severe oral ulcers.   Lovastatin  Other    myalgias   Prentice Elsie Kerns, PA-C 12/11/24 1935

## 2024-12-11 NOTE — Telephone Encounter (Signed)
 Left message for pt to call back

## 2024-12-12 ENCOUNTER — Telehealth: Payer: Self-pay

## 2024-12-13 NOTE — Telephone Encounter (Signed)
 Canceled scan d/t not being able to confirm with patient.

## 2024-12-13 NOTE — Telephone Encounter (Signed)
 Left message for patient to call back

## 2024-12-16 ENCOUNTER — Ambulatory Visit (HOSPITAL_COMMUNITY)

## 2024-12-16 NOTE — Telephone Encounter (Signed)
 Left message for pt to call back

## 2024-12-17 NOTE — Telephone Encounter (Signed)
 Letter mailed home

## 2025-01-08 ENCOUNTER — Ambulatory Visit: Admitting: Pediatrics
